# Patient Record
Sex: Male | Born: 2000 | Race: White | Hispanic: No | Marital: Single | Smoking: Never smoker
Health system: Southern US, Community
[De-identification: ages and names within clinical notes are randomized; demographics above are authoritative.]

## PROBLEM LIST (undated history)

## (undated) ENCOUNTER — Emergency Department (HOSPITAL_COMMUNITY): Admission: EM | Payer: BC Managed Care – PPO | Source: Home / Self Care

## (undated) ENCOUNTER — Emergency Department (HOSPITAL_COMMUNITY): Payer: BC Managed Care – PPO

## (undated) DIAGNOSIS — F419 Anxiety disorder, unspecified: Secondary | ICD-10-CM

## (undated) DIAGNOSIS — R569 Unspecified convulsions: Secondary | ICD-10-CM

## (undated) DIAGNOSIS — F329 Major depressive disorder, single episode, unspecified: Secondary | ICD-10-CM

## (undated) DIAGNOSIS — F32A Depression, unspecified: Secondary | ICD-10-CM

## (undated) HISTORY — PX: NO PAST SURGERIES: SHX2092

---
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
## 2022-03-06 ENCOUNTER — Encounter (HOSPITAL_COMMUNITY): Payer: Self-pay

## 2022-03-06 ENCOUNTER — Emergency Department (HOSPITAL_COMMUNITY)
Admission: EM | Admit: 2022-03-06 | Discharge: 2022-03-06 | Disposition: A | Discharge status: Home / Self Care | Payer: Worker's Compensation | Attending: Emergency Medicine | Admitting: Emergency Medicine

## 2022-03-06 ENCOUNTER — Other Ambulatory Visit: Payer: Self-pay

## 2022-03-06 DIAGNOSIS — T7840XA Allergy, unspecified, initial encounter: Secondary | ICD-10-CM | POA: Insufficient documentation

## 2022-03-06 DIAGNOSIS — T63441A Toxic effect of venom of bees, accidental (unintentional), initial encounter: Secondary | ICD-10-CM

## 2022-03-06 MED ORDER — EPINEPHRINE 0.3 MG/0.3ML IJ SOAJ: 0.3000 mg | INTRAMUSCULAR | 0 refills | Status: DC | PRN

## 2022-03-06 MED ORDER — FAMOTIDINE IN NACL 20-0.9 MG/50ML-% IV SOLN
20.0000 mg | Freq: Once | INTRAVENOUS | Status: AC
  Administered 2022-03-06: 20 mg via INTRAVENOUS
  Filled 2022-03-06: qty 50

## 2022-03-06 MED ORDER — METHYLPREDNISOLONE SODIUM SUCC 125 MG IJ SOLR
125.0000 mg | Freq: Once | INTRAMUSCULAR | Status: AC
  Administered 2022-03-06: 125 mg via INTRAVENOUS
  Filled 2022-03-06: qty 2

## 2022-03-06 MED ORDER — LORAZEPAM 2 MG/ML IJ SOLN
0.5000 mg | Freq: Once | INTRAMUSCULAR | Status: AC
  Administered 2022-03-06: 0.5 mg via INTRAVENOUS
  Filled 2022-03-06: qty 1

## 2022-03-06 NOTE — Discharge Instructions (Addendum)
It was our pleasure to provide your ER care today - we hope that you feel better.  Follow up with primary care doctor/allergist in coming week.  In event of severe reaction to bee sting (I.e. unable to breath, about to faint, throat closing) - use epipen as directed and seek medical attention right away.   Return to ER if worse, new symptoms, fevers, trouble breathing, general rash, weak/fainting, or other concern.  You were given meds that cause drowsiness - no driving for the next 8 hours.

## 2022-03-06 NOTE — ED Provider Notes (Addendum)
[facility]-EMERGENCY DEPT Provider Note   CSN: [id number] Arrival date & time: 03/06/22  1416     History  Chief Complaint  Patient presents with   Allergic Reaction    [facility] is a 21 y.o. male.  Pt presents via EMS s/p possible anaphylactic/allergic rxn to bee sting. Pt indicates stung by yellow jacket to left forearm shortly pta. Ems gave epi, benadryl and zofran. Pt felt generally weak, sob and diaphoretic post sting - post meds is feeling improved. Mild pain at sting site. No general rash. Denies throat closing/swelling. No current sob or faintness. No chest pain or discomfort. Felt fine, asymptomatic, at baseline prior to sting.   The history is provided by the patient, a parent, medical records and the EMS personnel.  Allergic Reaction Presenting symptoms: no difficulty swallowing and no rash        Home Medications Prior to Admission medications   Medication Sig Start Date End Date Taking? Authorizing Provider  FLUoxetine (PROZAC) 40 MG capsule Take 40 mg by mouth every morning. 04/21/21  Yes [provider]  gabapentin (NEURONTIN) 100 MG capsule Take 200 mg by mouth at bedtime. 01/14/22  Yes [provider]  guanFACINE (TENEX) 1 MG tablet Take 1 tablet by mouth 2 (two) times daily. 06/05/21  Yes [provider]      Allergies    Bee venom and Haldol [haloperidol]    Review of Systems   Review of Systems  Constitutional:  Negative for fever.  HENT:  Negative for trouble swallowing.   Eyes:  Negative for redness.  Respiratory:  Negative for cough and shortness of breath.   Cardiovascular:  Negative for chest pain.  Gastrointestinal:  Negative for abdominal pain, diarrhea and vomiting.  Genitourinary:  Negative for flank pain.  Musculoskeletal:  Negative for back pain and neck pain.  Skin:  Negative for rash.  Neurological:  Negative for headaches.  Hematological:  Does not bruise/bleed easily.   Psychiatric/Behavioral:  Positive for confusion. The patient is nervous/anxious.     Physical Exam Updated Vital Signs BP 123/60   Pulse (!) 56   Temp (!) 97.2 F (36.2 C)   Resp (!) 21   Ht 1.905 m (6\' 3" )   Wt 83.9 kg   SpO2 99%   BMI 23.12 kg/m  Physical Exam Vitals and nursing note reviewed.  Constitutional:      Appearance: Normal appearance. He is well-developed.  HENT:     Head: Atraumatic.     Nose: Nose normal.     Mouth/Throat:     Mouth: Mucous membranes are moist.     Pharynx: Oropharynx is clear.  Eyes:     General: No scleral icterus.    Conjunctiva/sclera: Conjunctivae normal.     Pupils: Pupils are equal, round, and reactive to light.  Neck:     Trachea: No tracheal deviation.  Cardiovascular:     Rate and Rhythm: Normal rate and regular rhythm.     Pulses: Normal pulses.     Heart sounds: Normal heart sounds. No murmur heard.    No friction rub. No gallop.  Pulmonary:     Effort: Pulmonary effort is normal. No accessory muscle usage or respiratory distress.     Breath sounds: Normal breath sounds.  Abdominal:     General: Bowel sounds are normal. There is no distension.     Palpations: Abdomen is soft.     Tenderness: There is no abdominal tenderness.  Genitourinary:  Comments: No cva tenderness. Musculoskeletal:        General: No swelling.     Cervical back: Normal range of motion and neck supple. No rigidity.  Skin:    General: Skin is warm and dry.     Findings: No rash.     Comments: Sting site left forearm. Small area of mild erythema ~ 2-3 mm, c/w sting, no fb. No significant swelling noted.   Neurological:     Mental Status: He is alert.     Comments: Alert, speech clear. Motor/sens grossly intact.  Psychiatric:     Comments: Anxious appearing.      ED Results / Procedures / Treatments   Labs (all labs ordered are listed, but only abnormal results are displayed) Results for orders placed or performed during the hospital  encounter of 11/27/20  Comprehensive metabolic panel  Result Value Ref Range   Sodium 140 135 - 145 mmol/L   Potassium 3.6 3.5 - 5.1 mmol/L   Chloride 110 98 - 111 mmol/L   CO2 16 (L) 22 - 32 mmol/L   Glucose, Bld 87 70 - 99 mg/dL   BUN 15 4 - 18 mg/dL   Creatinine, Ser 4.03 (H) 0.50 - 1.00 mg/dL   Calcium 47.4 8.9 - 25.9 mg/dL   Total Protein 7.5 6.5 - 8.1 g/dL   Albumin 4.8 3.5 - 5.0 g/dL   AST 31 15 - 41 U/L   ALT 21 0 - 44 U/L   Alkaline Phosphatase 103 52 - 171 U/L   Total Bilirubin 0.8 0.3 - 1.2 mg/dL   GFR, Estimated NOT CALCULATED >60 mL/min   Anion gap 14 5 - 15  Salicylate level  Result Value Ref Range   Salicylate Lvl <7.0 (L) 7.0 - 30.0 mg/dL  Acetaminophen level  Result Value Ref Range   Acetaminophen (Tylenol), Serum <10 (L) 10 - 30 ug/mL  Ethanol  Result Value Ref Range   Alcohol, Ethyl (B) <10 <10 mg/dL  Urine rapid drug screen (hosp performed)  Result Value Ref Range   Opiates NONE DETECTED NONE DETECTED   Cocaine NONE DETECTED NONE DETECTED   Benzodiazepines NONE DETECTED NONE DETECTED   Amphetamines NONE DETECTED NONE DETECTED   Tetrahydrocannabinol NONE DETECTED NONE DETECTED   Barbiturates NONE DETECTED NONE DETECTED  CBC WITH DIFFERENTIAL  Result Value Ref Range   WBC 8.7 4.5 - 13.5 K/uL   RBC 5.02 3.80 - 5.70 MIL/uL   Hemoglobin 14.3 12.0 - 16.0 g/dL   HCT 56.3 87.5 - 64.3 %   MCV 82.1 78.0 - 98.0 fL   MCH 28.5 25.0 - 34.0 pg   MCHC 34.7 31.0 - 37.0 g/dL   RDW 32.9 51.8 - 84.1 %   Platelets 257 150 - 400 K/uL   nRBC 0.0 0.0 - 0.2 %   Neutrophils Relative % 74 %   Neutro Abs 6.5 1.7 - 8.0 K/uL   Lymphocytes Relative 15 %   Lymphs Abs 1.3 1.1 - 4.8 K/uL   Monocytes Relative 10 %   Monocytes Absolute 0.9 0.2 - 1.2 K/uL   Eosinophils Relative 0 %   Eosinophils Absolute 0.0 0.0 - 1.2 K/uL   Basophils Relative 0 %   Basophils Absolute 0.0 0.0 - 0.1 K/uL   Immature Granulocytes 1 %   Abs Immature Granulocytes 0.04 0.00 - 0.07 K/uL   Urinalysis, Routine w reflex microscopic  Result Value Ref Range   Color, Urine YELLOW YELLOW   APPearance TURBID (A) CLEAR  Specific Gravity, Urine 1.017 1.005 - 1.030   pH 7.0 5.0 - 8.0   Glucose, UA NEGATIVE NEGATIVE mg/dL   Hgb urine dipstick NEGATIVE NEGATIVE   Bilirubin Urine NEGATIVE NEGATIVE   Ketones, ur NEGATIVE NEGATIVE mg/dL   Protein, ur NEGATIVE NEGATIVE mg/dL   Nitrite NEGATIVE NEGATIVE   Leukocytes,Ua NEGATIVE NEGATIVE   RBC / HPF 0-5 0 - 5 RBC/hpf   Bacteria, UA NONE SEEN NONE SEEN   Mucus PRESENT    Amorphous Crystal PRESENT   Comprehensive metabolic panel  Result Value Ref Range   Sodium 140 135 - 145 mmol/L   Potassium 3.6 3.5 - 5.1 mmol/L   Chloride 110 98 - 111 mmol/L   CO2 22 22 - 32 mmol/L   Glucose, Bld 89 70 - 99 mg/dL   BUN 13 4 - 18 mg/dL   Creatinine, Ser 4.09 0.50 - 1.00 mg/dL   Calcium 8.6 (L) 8.9 - 10.3 mg/dL   Total Protein 6.0 (L) 6.5 - 8.1 g/dL   Albumin 3.8 3.5 - 5.0 g/dL   AST 21 15 - 41 U/L   ALT 18 0 - 44 U/L   Alkaline Phosphatase 88 52 - 171 U/L   Total Bilirubin 0.9 0.3 - 1.2 mg/dL   GFR, Estimated NOT CALCULATED >60 mL/min   Anion gap 8 5 - 15  CBG monitoring, ED  Result Value Ref Range   Glucose-Capillary 95 70 - 99 mg/dL  I-Stat venous blood gas, [facility] ED)  Result Value Ref Range   pH, Ven 7.604 (HH) 7.250 - 7.430   pCO2, Ven 16.6 (LL) 44.0 - 60.0 mmHg   pO2, Ven 64.0 (H) 32.0 - 45.0 mmHg   Bicarbonate 16.5 (L) 20.0 - 28.0 mmol/L   TCO2 17 (L) 22 - 32 mmol/L   O2 Saturation 96.0 %   Acid-base deficit 2.0 0.0 - 2.0 mmol/L   Sodium 144 135 - 145 mmol/L   Potassium 3.1 (L) 3.5 - 5.1 mmol/L   Calcium, Ion 1.11 (L) 1.15 - 1.40 mmol/L   HCT 40.0 36.0 - 49.0 %   Hemoglobin 13.6 12.0 - 16.0 g/dL   Sample type VENOUS    Comment NOTIFIED PHYSICIAN    No results found.   EKG None  Radiology No results found.  Procedures Procedures    Medications Ordered in ED Medications  methylPREDNISolone sodium succinate  (SOLU-MEDROL) 125 mg/2 mL injection 125 mg (has no administration in time range)  famotidine (PEPCID) IVPB 20 mg premix (has no administration in time range)  LORazepam (ATIVAN) injection 0.5 mg (has no administration in time range)    ED Course/ Medical Decision Making/ A&P                           Medical Decision Making Problems Addressed: Bee sting reaction, accidental or unintentional, initial encounter: acute illness or injury with systemic symptoms that poses a threat to life or bodily functions  Amount and/or Complexity of Data Reviewed Independent Historian: parent    Details: hx External Data Reviewed: notes.  Risk Prescription drug management. Decision regarding hospitalization.   Iv ns. Continuous pulse ox and cardiac monitoring.   Diff dx includes bee sting, local rxn, anaphylaxis, etc-  dispo decision including potential need for admission considered if recurrent/severe symptoms, failure to improve w med therapy, etc - will give additional tx and reassess.   Reviewed nursing notes and prior charts for additional history. External reports reviewed. Additional history from:  EMS/fam.  Cardiac monitor: sinus rhythm, rate 66.  Solumedrol iv. Pepcid iv. Ativan iv.   Po fluids.  Pt feels improved. No recurrence of earlier symptoms.   Pt observed in ED for ~ 3.5 hrs, symptoms resolved.   Pt appears stable for d/c.   Rec pcp f/u.  Return precautions provided.            Final Clinical Impression(s) / ED Diagnoses Final diagnoses:  None    Rx / DC Orders ED Discharge Orders     None           Cathren Laine, MD 03/06/22 2315

## 2022-03-06 NOTE — ED Triage Notes (Signed)
Pt to er room number 14 via ems, per ems [place] was [place] [employer] and was stung by a bee and started feeling poorly, sob and diaphoretic, ems states that they gave epi, 50mg  benadryl, 4mg  zofran.  Pt denies feelings like his tongue or throat are swelling at this time, resps even and unlabored, pt eyes closed, but opens them to voice.

## 2022-07-23 ENCOUNTER — Ambulatory Visit (INDEPENDENT_AMBULATORY_CARE_PROVIDER_SITE_OTHER): Payer: Self-pay

## 2022-07-23 ENCOUNTER — Other Ambulatory Visit: Payer: Self-pay | Admitting: Physician Assistant

## 2022-07-23 DIAGNOSIS — Z021 Encounter for pre-employment examination: Secondary | ICD-10-CM

## 2022-08-25 IMAGING — DX DG CHEST 1V PORT
1 series · 1 of 1 positions shown · non-contrast
Comparison: Chest x-ray 11/27/2020.

CLINICAL DATA: Tachypnea.

EXAM:
PORTABLE CHEST 1 VIEW

[chest ap]
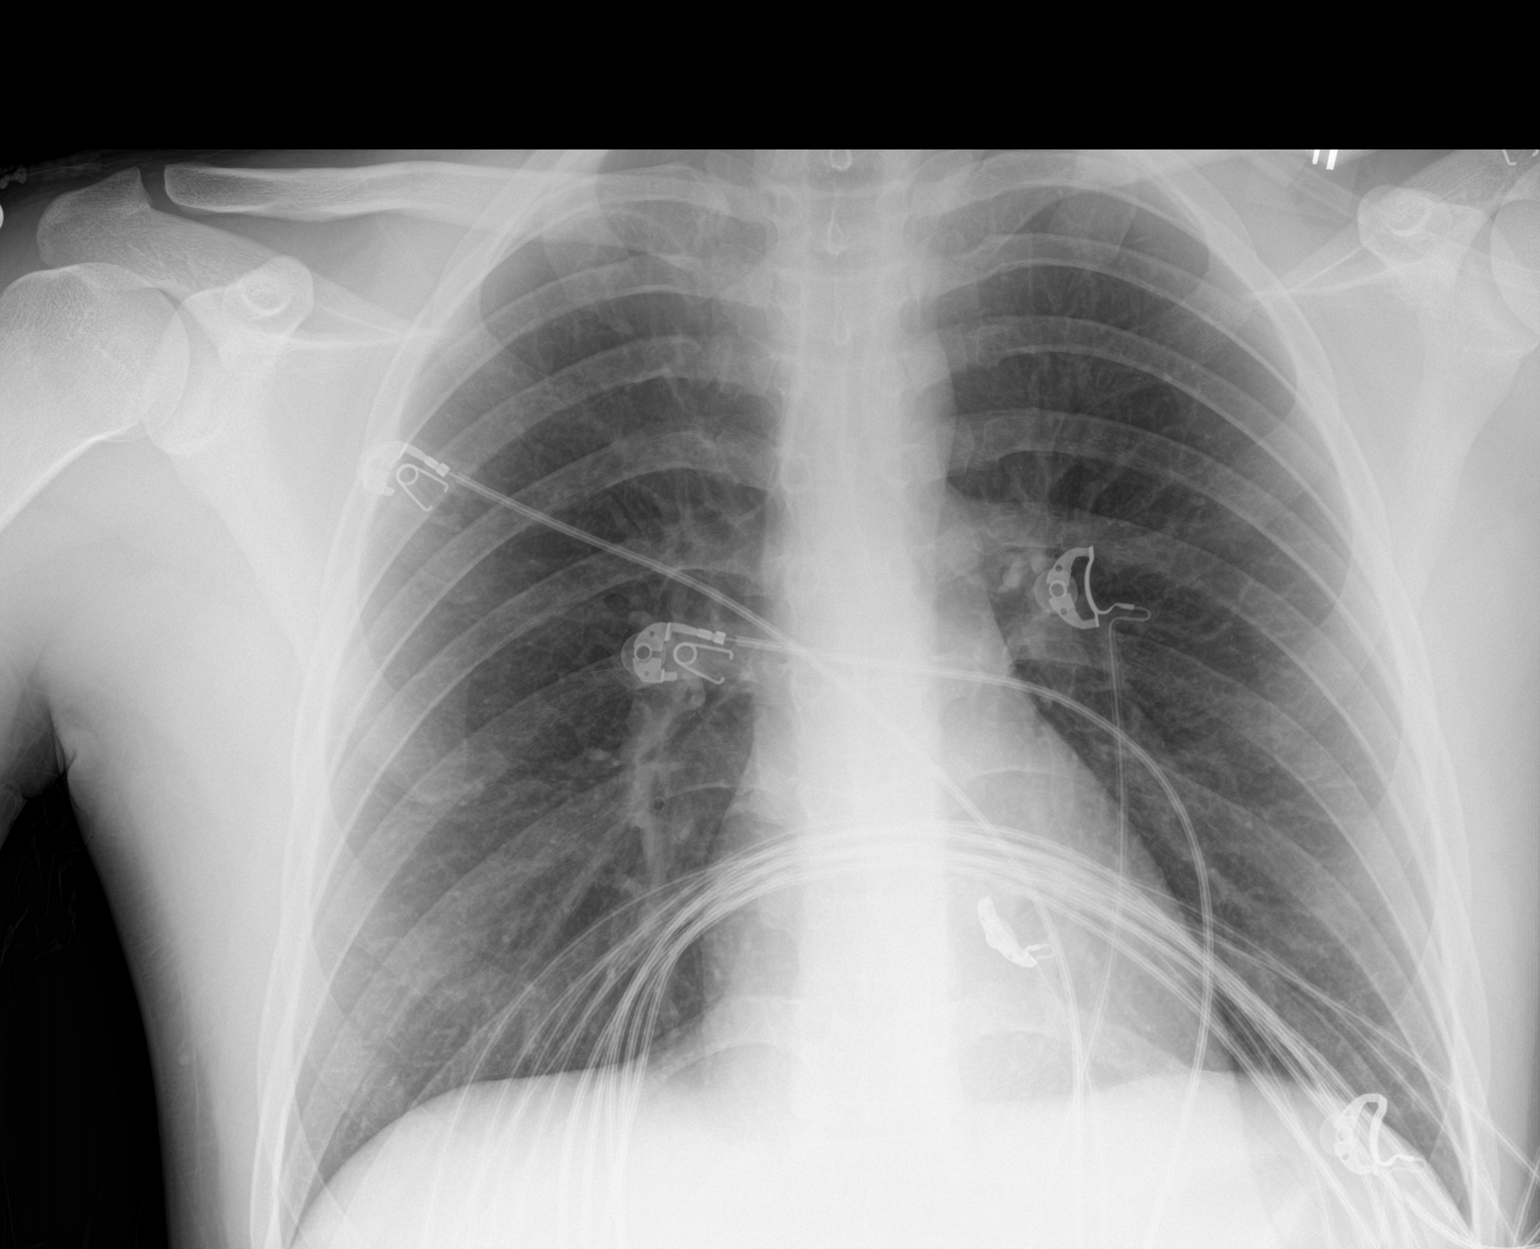

[1 of 1 positions shown; findings below may reference images not displayed]

FINDINGS: The heart size and mediastinal contours are within normal limits.
Both lungs are clear. The visualized skeletal structures are
unremarkable.
IMPRESSION: No active disease.

## 2023-02-28 ENCOUNTER — Other Ambulatory Visit: Payer: Self-pay

## 2023-02-28 ENCOUNTER — Encounter (HOSPITAL_COMMUNITY): Payer: Self-pay | Admitting: Nurse Practitioner

## 2023-02-28 ENCOUNTER — Inpatient Hospital Stay (HOSPITAL_COMMUNITY)
Admission: AD | Admit: 2023-02-28 | Discharge: 2023-03-04 | DRG: 885 | Disposition: A | Discharge status: Home / Self Care | Payer: BC Managed Care – PPO | Source: Intra-hospital | Attending: Psychiatry | Admitting: Psychiatry

## 2023-02-28 ENCOUNTER — Emergency Department (EMERGENCY_DEPARTMENT_HOSPITAL)
Admission: EM | Admit: 2023-02-28 | Discharge: 2023-02-28 | Disposition: A | Discharge status: Other Acute Inpatient Hospital | Payer: BC Managed Care – PPO | Source: Home / Self Care | Attending: Emergency Medicine | Admitting: Emergency Medicine

## 2023-02-28 ENCOUNTER — Encounter (HOSPITAL_COMMUNITY): Payer: Self-pay

## 2023-02-28 DIAGNOSIS — Z818 Family history of other mental and behavioral disorders: Secondary | ICD-10-CM

## 2023-02-28 DIAGNOSIS — R45851 Suicidal ideations: Secondary | ICD-10-CM

## 2023-02-28 DIAGNOSIS — S51812A Laceration without foreign body of left forearm, initial encounter: Secondary | ICD-10-CM | POA: Insufficient documentation

## 2023-02-28 DIAGNOSIS — T1491XA Suicide attempt, initial encounter: Secondary | ICD-10-CM | POA: Diagnosis present

## 2023-02-28 DIAGNOSIS — Z9151 Personal history of suicidal behavior: Secondary | ICD-10-CM | POA: Diagnosis not present

## 2023-02-28 DIAGNOSIS — Z560 Unemployment, unspecified: Secondary | ICD-10-CM

## 2023-02-28 DIAGNOSIS — Z79899 Other long term (current) drug therapy: Secondary | ICD-10-CM

## 2023-02-28 DIAGNOSIS — F332 Major depressive disorder, recurrent severe without psychotic features: Secondary | ICD-10-CM | POA: Diagnosis present

## 2023-02-28 DIAGNOSIS — X781XXA Intentional self-harm by knife, initial encounter: Secondary | ICD-10-CM | POA: Insufficient documentation

## 2023-02-28 DIAGNOSIS — X789XXA Intentional self-harm by unspecified sharp object, initial encounter: Secondary | ICD-10-CM | POA: Diagnosis not present

## 2023-02-28 DIAGNOSIS — S41112A Laceration without foreign body of left upper arm, initial encounter: Secondary | ICD-10-CM

## 2023-02-28 DIAGNOSIS — Z9152 Personal history of nonsuicidal self-harm: Secondary | ICD-10-CM | POA: Diagnosis not present

## 2023-02-28 DIAGNOSIS — S41122A Laceration with foreign body of left upper arm, initial encounter: Secondary | ICD-10-CM | POA: Diagnosis present

## 2023-02-28 DIAGNOSIS — F3341 Major depressive disorder, recurrent, in partial remission: Secondary | ICD-10-CM | POA: Diagnosis present

## 2023-02-28 LAB — RAPID URINE DRUG SCREEN, HOSP PERFORMED
Amphetamines: NOT DETECTED
Barbiturates: NOT DETECTED
Benzodiazepines: NOT DETECTED
Cocaine: NOT DETECTED
Opiates: NOT DETECTED
Tetrahydrocannabinol: NOT DETECTED

## 2023-02-28 LAB — CBC WITH DIFFERENTIAL/PLATELET
Abs Immature Granulocytes: 0.05 10*3/uL (ref 0.00–0.07)
Basophils Absolute: 0.1 10*3/uL (ref 0.0–0.1)
Basophils Relative: 1 %
Eosinophils Absolute: 0.3 10*3/uL (ref 0.0–0.5)
Eosinophils Relative: 3 %
HCT: 42.2 % (ref 39.0–52.0)
Hemoglobin: 14.2 g/dL (ref 13.0–17.0)
Immature Granulocytes: 0 %
Lymphocytes Relative: 13 %
Lymphs Abs: 1.5 10*3/uL (ref 0.7–4.0)
MCH: 26.6 pg (ref 26.0–34.0)
MCHC: 33.6 g/dL (ref 30.0–36.0)
MCV: 79 fL — ABNORMAL LOW (ref 80.0–100.0)
Monocytes Absolute: 0.9 10*3/uL (ref 0.1–1.0)
Monocytes Relative: 8 %
Neutro Abs: 8.8 10*3/uL — ABNORMAL HIGH (ref 1.7–7.7)
Neutrophils Relative %: 75 %
Platelets: 258 10*3/uL (ref 150–400)
RBC: 5.34 MIL/uL (ref 4.22–5.81)
RDW: 12.5 % (ref 11.5–15.5)
WBC: 11.5 10*3/uL — ABNORMAL HIGH (ref 4.0–10.5)
nRBC: 0 % (ref 0.0–0.2)

## 2023-02-28 LAB — COMPREHENSIVE METABOLIC PANEL
ALT: 21 U/L (ref 0–44)
AST: 23 U/L (ref 15–41)
Albumin: 4.6 g/dL (ref 3.5–5.0)
Alkaline Phosphatase: 82 U/L (ref 38–126)
Anion gap: 10 (ref 5–15)
BUN: 13 mg/dL (ref 6–20)
CO2: 21 mmol/L — ABNORMAL LOW (ref 22–32)
Calcium: 9.4 mg/dL (ref 8.9–10.3)
Chloride: 106 mmol/L (ref 98–111)
Creatinine, Ser: 1.05 mg/dL (ref 0.61–1.24)
GFR, Estimated: >60 mL/min (ref >60)
Glucose, Bld: 102 mg/dL — ABNORMAL HIGH (ref 70–99)
Potassium: 3.5 mmol/L (ref 3.5–5.1)
Sodium: 137 mmol/L (ref 135–145)
Total Bilirubin: 0.6 mg/dL (ref 0.3–1.2)
Total Protein: 7.8 g/dL (ref 6.5–8.1)

## 2023-02-28 LAB — ETHANOL: Alcohol, Ethyl (B): <10 mg/dL

## 2023-02-28 MED ORDER — MAGNESIUM HYDROXIDE 400 MG/5ML PO SUSP: 30.0000 mL | Freq: Every day | ORAL | Status: DC | PRN

## 2023-02-28 MED ORDER — HYDROXYZINE HCL 25 MG PO TABS
25.0000 mg | ORAL_TABLET | Freq: Three times a day (TID) | ORAL | Status: DC | PRN
  Filled 2023-02-28: qty 1

## 2023-02-28 MED ORDER — FLUOXETINE HCL 20 MG PO CAPS: 40.0000 mg | ORAL_CAPSULE | Freq: Every day | ORAL | Status: DC

## 2023-02-28 MED ORDER — ALUM & MAG HYDROXIDE-SIMETH 200-200-20 MG/5ML PO SUSP: 30.0000 mL | ORAL | Status: DC | PRN

## 2023-02-28 MED ORDER — LIDOCAINE-EPINEPHRINE (PF) 2 %-1:200000 IJ SOLN
10.0000 mL | Freq: Once | INTRAMUSCULAR | Status: AC
  Administered 2023-02-28: 10 mL
  Filled 2023-02-28: qty 20

## 2023-02-28 MED ORDER — ACETAMINOPHEN 325 MG PO TABS
650.0000 mg | ORAL_TABLET | Freq: Four times a day (QID) | ORAL | Status: DC | PRN
  Administered 2023-03-04: 650 mg via ORAL
  Filled 2023-02-28: qty 2

## 2023-02-28 MED ORDER — FLUOXETINE HCL 20 MG PO CAPS
40.0000 mg | ORAL_CAPSULE | Freq: Every day | ORAL | Status: DC
  Administered 2023-03-01 – 2023-03-04 (×4): 40 mg via ORAL
  Filled 2023-02-28 (×6): qty 2

## 2023-02-28 MED ORDER — HYDROXYZINE HCL 25 MG PO TABS: 25.0000 mg | ORAL_TABLET | Freq: Three times a day (TID) | ORAL | Status: DC | PRN

## 2023-02-28 NOTE — Consult Note (Signed)
BH ED ASSESSMENT   Reason for Consult:  Psychiatry evaluation Referring Physician:  ER Physician Patient Identification: Vincent Frank MRN:  [id number] ED Chief Complaint: Suicide attempt by cutting of wrist [facility])  Diagnosis:  Principal Problem:   Suicide attempt by cutting of wrist [facility]) Active Problems:   MDD (major depressive disorder), recurrent severe, without psychosis [facility])   ED Assessment Time Calculation: Start Time: 0926 Stop Time: 0958 Total Time in Minutes (Assessment Completion): 32   Subjective:   Vincent Frank is a 22 y.o. male patient admitted with Previous hx of MDD, Anxiety, ADHD, aggression, multiple suicide ideations and Oppositional defiant disorder brought to the ER by EMS after he cut his left fore arm and called 911.  According to patient this is his third time cutting himself.  His intention fir this cutting was to distract his attention because he lost two jobs yesterday and he says he takes responsibility for that.  HPI:  Patient was seen this morning he  states he did something stupid and lost his two jobs yesterday.,.  He did not want to go into details into what he did.  Patient became teary when speaking with Provider looking down occasionally.  Patient admits to diagnosis of Depression and that he takes Prozac.  He also states that he see Psychiatrist at [facility].  Although he denied suicide intent when he cut himself but he score high in his [place] suicide severity rating.  He also have hx of suicide ideations and running away per [facility] chart review.  Patient lives with his parents and according to the [facility] Chart review he was adopted at age 58 and relationship was great until an incident in the house that caused division in the family.  He has not been in good terms with his parents. Until yesterday patient had two jobs he really loved and lost them.  Patient is is a 22 years old who is well nourished, well groomed brought in to the ER by EMS  after he called to report he cut his fore arm.  He was hospitalized at [facility] in [place] 2021 when he cut his wrist.  He meets criteria for inpatient Psychiatry care.  We will seek bed placement at any facility that has available bed.  His Prozac and Hydroxyzine are resumed.   Currently he denies SI/HI/AVH   Past Psychiatric History: Previous hx of MDD, Anxiety, ADHD, aggression, multiple suicide ideations and Oppositional defiant disorder.  Hospitalized at [facility] back in 2021 after he cut his wrist.  Psychiatrist is Londell Moh at [facility]  Risk to Self or Others: Is the patient at risk to self? Yes Has the patient been a risk to self in the past 6 months? Yes Has the patient been a risk to self within the distant past? Yes Is the patient a risk to others? No Has the patient been a risk to others in the past 6 months? No Has the patient been a risk to others within the distant past? No  [place] Scale:  Flowsheet Row ED from 02/28/2023 in [facility] Emergency Department at [facility] ED from 03/06/2022 in [facility] Emergency Department at [facility] ED from 11/27/2021 in [facility] Emergency Department at [facility]  C-SSRS RISK CATEGORY High Risk No Risk No Risk       AIMS:  , , ,  ,   ASAM:    Substance Abuse:     Past Medical History:  Past Medical History:  Diagnosis Date   Major depressive disorder    History reviewed. No pertinent surgical history. Family History: History reviewed. No pertinent family history. Family Psychiatric  History: Denies, he does not know anything about biological family. Social History:  Social History   Substance and Sexual Activity  Alcohol Use Never     Social History   Substance and Sexual Activity  Drug Use Never    Social History   Socioeconomic History   Marital status: Single    Spouse name: Not on file   Number of children: Not on file   Years of education: Not on file   Highest  education level: Not on file  Occupational History   Not on file  Tobacco Use   Smoking status: Never   Smokeless tobacco: Never  Vaping Use   Vaping status: Never Used  Substance and Sexual Activity   Alcohol use: Never   Drug use: Never   Sexual activity: Not on file  Other Topics Concern   Not on file  Social History Narrative   Not on file   Social Determinants of Health   Financial Resource Strain: Not on file  Food Insecurity: Not on file  Transportation Needs: Not on file  Physical Activity: Not on file  Stress: Not on file  Social Connections: Unknown (01/16/2023)   Received from [facility]   Social Network    Social Network: Not on file   Additional Social History:    Allergies:   Allergies  Allergen Reactions   Bee Venom Anaphylaxis   Hornet Venom Anaphylaxis and Itching   Haldol [Haloperidol]     Per father dystonic reaction with  muscle contractures.    Labs:  Results for orders placed or performed during the hospital encounter of 02/28/23 (from the past 48 hour(s))  Comprehensive metabolic panel     Status: Abnormal   Collection Time: 02/28/23  6:19 AM  Result Value Ref Range   Sodium 137 135 - 145 mmol/L   Potassium 3.5 3.5 - 5.1 mmol/L   Chloride 106 98 - 111 mmol/L   CO2 21 (L) 22 - 32 mmol/L   Glucose, Bld 102 (H) 70 - 99 mg/dL    Comment: Glucose reference range applies only to samples taken after fasting for at least 8 hours.   BUN 13 6 - 20 mg/dL   Creatinine, Ser 4.13 0.61 - 1.24 mg/dL   Calcium 9.4 8.9 - 24.4 mg/dL   Total Protein 7.8 6.5 - 8.1 g/dL   Albumin 4.6 3.5 - 5.0 g/dL   AST 23 15 - 41 U/L   ALT 21 0 - 44 U/L   Alkaline Phosphatase 82 38 - 126 U/L   Total Bilirubin 0.6 0.3 - 1.2 mg/dL   GFR, Estimated >01 >02 mL/min    Comment: (NOTE) Calculated using the CKD-EPI Creatinine Equation (2021)    Anion gap 10 5 - 15    Comment: Performed at [facility], [address] [place], [place] [address]   Ethanol     Status: None   Collection Time: 02/28/23  6:19 AM  Result Value Ref Range   Alcohol, Ethyl (B) <10 <10 mg/dL    Comment: (NOTE) Lowest detectable limit for serum alcohol is 10 mg/dL.  For medical purposes only. Performed at [facility], [address], [place], [place] [address]   CBC with Diff     Status: Abnormal   Collection Time: 02/28/23  6:19  AM  Result Value Ref Range   WBC 11.5 (H) 4.0 - 10.5 K/uL   RBC 5.34 4.22 - 5.81 MIL/uL   Hemoglobin 14.2 13.0 - 17.0 g/dL   HCT 16.1 09.6 - 04.5 %   MCV 79.0 (L) 80.0 - 100.0 fL   MCH 26.6 26.0 - 34.0 pg   MCHC 33.6 30.0 - 36.0 g/dL   RDW 40.9 81.1 - 91.4 %   Platelets 258 150 - 400 K/uL   nRBC 0.0 0.0 - 0.2 %   Neutrophils Relative % 75 %   Neutro Abs 8.8 (H) 1.7 - 7.7 K/uL   Lymphocytes Relative 13 %   Lymphs Abs 1.5 0.7 - 4.0 K/uL   Monocytes Relative 8 %   Monocytes Absolute 0.9 0.1 - 1.0 K/uL   Eosinophils Relative 3 %   Eosinophils Absolute 0.3 0.0 - 0.5 K/uL   Basophils Relative 1 %   Basophils Absolute 0.1 0.0 - 0.1 K/uL   Immature Granulocytes 0 %   Abs Immature Granulocytes 0.05 0.00 - 0.07 K/uL    Comment: Performed at [facility], [address] [place], [place] [address]    Current Facility-Administered Medications  Medication Dose Route Frequency Provider Last Rate Last Admin   FLUoxetine (PROZAC) capsule 40 mg  40 mg Oral Daily Dahlia Byes C, NP       hydrOXYzine (ATARAX) tablet 25 mg  25 mg Oral TID PRN Earney Navy, NP       Current Outpatient Medications  Medication Sig Dispense Refill   EPINEPHrine 0.3 mg/0.3 mL IJ SOAJ injection Inject 0.3 mg into the muscle as needed for anaphylaxis. 2 each 0   FLUoxetine (PROZAC) 40 MG capsule Take 40 mg by mouth every morning.     gabapentin (NEURONTIN) 100 MG capsule Take 200 mg by mouth at bedtime. (Patient not taking: Reported on 02/28/2023)     guanFACINE (TENEX) 1 MG tablet Take 1 tablet by mouth  2 (two) times daily. (Patient not taking: Reported on 02/28/2023)      Musculoskeletal: Strength & Muscle Tone: within normal limits Gait & Station: normal Patient leans: Front   Psychiatric Specialty Exam: Presentation  General Appearance:  Casual; Well Groomed  Eye Contact: Good  Speech: Clear and Coherent; Normal Rate  Speech Volume: Normal  Handedness: Right   Mood and Affect  Mood: Depressed; Angry  Affect: Congruent; Depressed   Thought Process  Thought Processes: Coherent; Goal Directed  Descriptions of Associations:Intact  Orientation:Full (Time, Place and Person)  Thought Content:Logical  History of Schizophrenia/Schizoaffective disorder:No data recorded Duration of Psychotic Symptoms:No data recorded Hallucinations:Hallucinations: None  Ideas of Reference:None  Suicidal Thoughts:Suicidal Thoughts: No  Homicidal Thoughts:Homicidal Thoughts: No   Sensorium  Memory: Immediate Good; Recent Good; Remote Good  Judgment: Intact  Insight: Present   Executive Functions  Concentration: Good  Attention Span: Good  Recall: Good  Fund of Knowledge: Good  Language: Good   Psychomotor Activity  Psychomotor Activity: Psychomotor Activity: Normal   Assets  Assets: Communication Skills; Desire for Improvement; Housing; Physical Health    Sleep  Sleep: Sleep: Good   Physical Exam: Physical Exam Vitals and nursing note reviewed.  Constitutional:      Appearance: Normal appearance. He is normal weight.  HENT:     Head: Normocephalic.     Nose: Nose normal.  Cardiovascular:     Rate and Rhythm: Normal rate and regular rhythm.  Pulmonary:     Effort: Pulmonary effort is normal.  Musculoskeletal:        General: Normal range of motion.     Cervical back: Normal range of motion.  Skin:    General: Skin is dry.     Comments: Self made Deep cut to left fore arm required sutures.  Neurological:     Mental Status: He is  alert and oriented to person, place, and time.  Psychiatric:        Attention and Perception: Attention and perception normal.        Mood and Affect: Mood is anxious and depressed.        Speech: Speech normal.        Behavior: Behavior normal.        Thought Content: Thought content normal.        Cognition and Memory: Cognition and memory normal.        Judgment: Judgment is impulsive.    Review of Systems  Constitutional: Negative.   HENT: Negative.    Eyes: Negative.   Respiratory: Negative.    Cardiovascular: Negative.   Gastrointestinal: Negative.   Genitourinary: Negative.   Musculoskeletal: Negative.   Skin: Negative.        self made Deep cut to left fore arm required sutures.  Neurological: Negative.   Endo/Heme/Allergies: Negative.   Psychiatric/Behavioral:  Positive for depression. The patient is nervous/anxious.        Suicide attempt   Blood pressure 139/83, pulse 85, temperature 97.8 F (36.6 C), temperature source Oral, resp. rate 18, height 6\' 3"  (1.905 m), weight 102.1 kg, SpO2 97%. Body mass index is 28.12 kg/m.  Medical Decision Making: Patient denies SI/HI/AVH but this is third time he is cutting himself.  He lost two jobs in one day.  He feels hopeless at this time.He meets criteria for inpatient Psychiatry care.  We will seek bed placement at any facility that has available bed.  His Prozac and Hydroxyzine are resumed.   Currently he denies SI/HI/AVH   Problem 1: Suicide attempt  Problem 2: Recurrent Major Depressive disorder, severe without Psychotic features.  Disposition:  Admit, seek inpatient Hospitalization.  Earney Navy, NP-PMHNP-BC 02/28/2023 9:58 AM

## 2023-02-28 NOTE — Progress Notes (Signed)
Pt admitted to [facility] at this time from [facility]. Pt reports he was transported there via EMS after engaging in self injurious behavior. Pt cut left arm deep enough to require 5 stiches. Pt states he was having suicidal thoughts at the time however denied any plan or intent. Pt reports he cut self "To make my head stop" Pt states stressor include losing his 2 jobs yesterday and family conflicts. Pt lives at home with his parents and is unhappy about his situation. Pt denies hallucinations, does note a long history of paranoia. Pt cooperative throughout assessment, blunted affect noted. Q 15 minute checks initiated.

## 2023-02-28 NOTE — ED Provider Notes (Signed)
[place] EMERGENCY DEPARTMENT AT [facility] Provider Note   CSN: [id number] Arrival date & time: 02/28/23  0550     History  Chief Complaint  Patient presents with   Laceration   Suicidal    Pt bib ems for 2 inch laceration to pts L arm and suicidal ideation. Pt states he has had an increase in life stressors. Past history of self harm. Pt states he is current with anxiety and depression meds. [place], able to  de-escalate pt. Pt is A&O    Vincent Frank is a 22 y.o. male.  Patient presents to the emergency department with 2 lacerations on the posterior left forearm.  Patient endorses suicidal ideation and was self harming with a knife.  He endorses increased stressors in his life.  He denies auditory or visual hallucinations, denies homicidal ideation at this time.  He has a history of self-harm.  He states he is compliant with his Prozac and Neurontin.  HPI     Home Medications Prior to Admission medications   Medication Sig Start Date End Date Taking? Authorizing Provider  EPINEPHrine 0.3 mg/0.3 mL IJ SOAJ injection Inject 0.3 mg into the muscle as needed for anaphylaxis. 03/06/22   Cathren Laine, MD  FLUoxetine (PROZAC) 40 MG capsule Take 40 mg by mouth every morning. 04/21/21   [provider]  gabapentin (NEURONTIN) 100 MG capsule Take 200 mg by mouth at bedtime. 01/14/22   [provider]  guanFACINE (TENEX) 1 MG tablet Take 1 tablet by mouth 2 (two) times daily. 06/05/21   [provider]      Allergies    Bee venom and Haldol [haloperidol]    Review of Systems   Review of Systems  Physical Exam Updated Vital Signs BP 139/83 (BP Location: Right Arm)   Pulse 85   Temp 97.8 F (36.6 C) (Oral)   Resp 18   Ht 6\' 3"  (1.905 m)   Wt 102.1 kg   SpO2 97%   BMI 28.12 kg/m  Physical Exam Vitals and nursing note reviewed.  HENT:     Head: Normocephalic and atraumatic.  Eyes:     Conjunctiva/sclera:  Conjunctivae normal.  Pulmonary:     Effort: Pulmonary effort is normal. No respiratory distress.  Musculoskeletal:        General: No signs of injury.     Cervical back: Normal range of motion.  Skin:    General: Skin is dry.     Comments: 2 lacerations on posterior left forearm. Bleeding controlled.   Neurological:     Mental Status: He is alert.  Psychiatric:        Speech: Speech normal.        Behavior: Behavior normal.     ED Results / Procedures / Treatments   Labs (all labs ordered are listed, but only abnormal results are displayed) Labs Reviewed  COMPREHENSIVE METABOLIC PANEL  ETHANOL  RAPID URINE DRUG SCREEN, HOSP PERFORMED  CBC WITH DIFFERENTIAL/PLATELET    EKG None  Radiology No results found.  Procedures .Marland KitchenLaceration Repair  Date/Time: 02/28/2023 6:32 AM  Performed by: Darrick Grinder, PA-C Authorized by: Darrick Grinder, PA-C   Consent:    Consent obtained:  Verbal   Consent given by:  Patient   Risks, benefits, and alternatives were discussed: yes     Risks discussed:  Infection, pain and poor wound healing Universal protocol:    Patient identity confirmed:  Verbally with  patient Anesthesia:    Anesthesia method:  Local infiltration   Local anesthetic:  Lidocaine 2% WITH epi Laceration details:    Location:  Shoulder/arm   Shoulder/arm location:  L lower arm   Length (cm):  2 Exploration:    Hemostasis achieved with:  Direct pressure Treatment:    Area cleansed with:  Saline   Amount of cleaning:  Standard Skin repair:    Repair method:  Sutures   Suture size:  4-0   Suture material:  Prolene   Suture technique:  Simple interrupted   Number of sutures:  3 Approximation:    Approximation:  Close Post-procedure details:    Procedure completion:  Tolerated well, no immediate complications .Marland KitchenLaceration Repair  Date/Time: 02/28/2023 6:33 AM  Performed by: Darrick Grinder, PA-C Authorized by: Darrick Grinder, PA-C   Consent:     Consent obtained:  Verbal   Consent given by:  Patient   Risks discussed:  Infection and pain Anesthesia:    Anesthesia method:  Local infiltration   Local anesthetic:  Lidocaine 2% WITH epi Laceration details:    Location:  Shoulder/arm   Shoulder/arm location:  L lower arm   Length (cm):  1.5 Treatment:    Area cleansed with:  Saline   Amount of cleaning:  Standard Skin repair:    Repair method:  Sutures   Suture size:  4-0   Suture material:  Prolene   Suture technique:  Simple interrupted   Number of sutures:  2 Approximation:    Approximation:  Close Repair type:    Repair type:  Simple Post-procedure details:    Procedure completion:  Tolerated well, no immediate complications     Medications Ordered in ED Medications  lidocaine-EPINEPHrine (XYLOCAINE W/EPI) 2 %-1:200000 (PF) injection 10 mL (10 mLs Infiltration Given 02/28/23 8416)    ED Course/ Medical Decision Making/ A&P                                 Medical Decision Making Amount and/or Complexity of Data Reviewed Labs: ordered.  Risk Prescription drug management.   This patient presents to the ED for concern of lacerations and suicidal ideation.  Co morbidities that complicate the patient evaluation  History of self-harm, suicidal ideation   Additional history obtained:  Additional history obtained from EMS External records from outside source obtained and reviewed including behavioral health notes from March of this year   Lab Tests:  I Ordered medical clearance labs which are pending at this time   Test / Admission - Considered:  Patient care being transferred to [employer], PA-C. Patient lacerations repaired as noted above and will need removal in 7-10 days. Disposition pending labs and TTS evaluation/recommendations. Patient is currently VOLUNTARY.          Final Clinical Impression(s) / ED Diagnoses Final diagnoses:  Suicidal ideation  Laceration of left upper  extremity with complication, initial encounter    Rx / DC Orders ED Discharge Orders     None         Pamala Duffel 02/28/23 6063    Dione Booze, MD 02/28/23 [phone]

## 2023-02-28 NOTE — Discharge Instructions (Signed)
Sutures will need removal in 7-10 days.

## 2023-02-28 NOTE — ED Notes (Signed)
Provider at bedside for lac repair

## 2023-02-28 NOTE — ED Provider Notes (Signed)
Accepted handoff at shift change from [facility]. Please see prior provider note for full HPI.  Briefly: Patient is a 22 y.o. male who presents to the ER for suicidal ideation and self harm. Hx of similar. Denies HI, AVH.   DDX/Plan: Laceration repaired by previous team. Voluntary at this time. Medically cleared by prior provider pending medical screening labs.   Results for orders placed or performed during the hospital encounter of 02/28/23  Comprehensive metabolic panel  Result Value Ref Range   Sodium 137 135 - 145 mmol/L   Potassium 3.5 3.5 - 5.1 mmol/L   Chloride 106 98 - 111 mmol/L   CO2 21 (L) 22 - 32 mmol/L   Glucose, Bld 102 (H) 70 - 99 mg/dL   BUN 13 6 - 20 mg/dL   Creatinine, Ser 9.60 0.61 - 1.24 mg/dL   Calcium 9.4 8.9 - 45.4 mg/dL   Total Protein 7.8 6.5 - 8.1 g/dL   Albumin 4.6 3.5 - 5.0 g/dL   AST 23 15 - 41 U/L   ALT 21 0 - 44 U/L   Alkaline Phosphatase 82 38 - 126 U/L   Total Bilirubin 0.6 0.3 - 1.2 mg/dL   GFR, Estimated >09 >81 mL/min   Anion gap 10 5 - 15  Ethanol  Result Value Ref Range   Alcohol, Ethyl (B) <10 <10 mg/dL  CBC with Diff  Result Value Ref Range   WBC 11.5 (H) 4.0 - 10.5 K/uL   RBC 5.34 4.22 - 5.81 MIL/uL   Hemoglobin 14.2 13.0 - 17.0 g/dL   HCT 19.1 47.8 - 29.5 %   MCV 79.0 (L) 80.0 - 100.0 fL   MCH 26.6 26.0 - 34.0 pg   MCHC 33.6 30.0 - 36.0 g/dL   RDW 62.1 30.8 - 65.7 %   Platelets 258 150 - 400 K/uL   nRBC 0.0 0.0 - 0.2 %   Neutrophils Relative % 75 %   Neutro Abs 8.8 (H) 1.7 - 7.7 K/uL   Lymphocytes Relative 13 %   Lymphs Abs 1.5 0.7 - 4.0 K/uL   Monocytes Relative 8 %   Monocytes Absolute 0.9 0.1 - 1.0 K/uL   Eosinophils Relative 3 %   Eosinophils Absolute 0.3 0.0 - 0.5 K/uL   Basophils Relative 1 %   Basophils Absolute 0.1 0.0 - 0.1 K/uL   Immature Granulocytes 0 %   Abs Immature Granulocytes 0.05 0.00 - 0.07 K/uL   No results found.   Medical Decision Making Amount and/or Complexity of Data Reviewed Labs:  ordered.  Risk Prescription drug management.  Labs: Medical clearance labs ordered, with following pertinent results: CBC and CMP unremarkable. Negative ethanol. UDS pending.   Cardiac monitoring: EKG obtained and interpreted by attending physician which shows: NSR  Medications: Prozac and hydroxyzine ordered per Psych NP. I have reviewed the patients home medicines and have made adjustments as needed.  Disposition: Patient is otherwise medically cleared at this time pending medical clearance laboratory evaluation. Will consult TTS and appreciate their recommendations.  1230 -- Behavioral Health NP Dahlia Byes evaluated patient and recommends inpatient treatment. Will attempt to find bed placement.     Jeanella Flattery 02/28/23 1236    Lorre Nick, MD 03/03/23 1529

## 2023-02-28 NOTE — Progress Notes (Signed)
[facility] LCSW Progress Note   02/28/2023    3:02 PM  Vincent Frank   [id number]   Type of Contact and Topic:  Psychiatric Bed Placement   Pt accepted to [facility] 402-2   Patient meets inpatient criteria per Dahlia Byes, NP   The attending provider will be Dr. Sherron Flemings   Call report to [phone]  Ilsa Iha, RN @[facility]  ED notified.     Pt scheduled  to arrive at [facility] TODAY. The bed is currently ready.    Damita Dunnings, MSW, LCSW-A  3:03 PM 02/28/2023

## 2023-02-28 NOTE — ED Notes (Signed)
Sitter at bedside.

## 2023-02-28 NOTE — ED Notes (Signed)
Pt dressed out by tech, room cleared of hazardous items. Pt belongings bag placed in cabinet at nurses station. Clothing items and black phone in bag

## 2023-03-01 ENCOUNTER — Encounter (HOSPITAL_COMMUNITY): Payer: Self-pay

## 2023-03-01 DIAGNOSIS — F332 Major depressive disorder, recurrent severe without psychotic features: Secondary | ICD-10-CM | POA: Diagnosis not present

## 2023-03-01 MED ORDER — ARIPIPRAZOLE 2 MG PO TABS
2.0000 mg | ORAL_TABLET | Freq: Every day | ORAL | Status: DC
  Administered 2023-03-02: 2 mg via ORAL
  Filled 2023-03-01 (×3): qty 1

## 2023-03-01 MED ORDER — OLANZAPINE 5 MG PO TBDP: 5.0000 mg | ORAL_TABLET | Freq: Three times a day (TID) | ORAL | Status: DC | PRN

## 2023-03-01 MED ORDER — LORAZEPAM 1 MG PO TABS: 1.0000 mg | ORAL_TABLET | ORAL | Status: DC | PRN

## 2023-03-01 MED ORDER — TRAZODONE HCL 50 MG PO TABS: 50.0000 mg | ORAL_TABLET | Freq: Every evening | ORAL | Status: DC | PRN

## 2023-03-01 MED ORDER — ZIPRASIDONE MESYLATE 20 MG IM SOLR: 20.0000 mg | INTRAMUSCULAR | Status: DC | PRN

## 2023-03-01 NOTE — H&P (Signed)
Psychiatric Admission Assessment Adult  Patient Identification: Vincent Frank MRN:  [id number] Date of Evaluation:  03/01/2023 Chief Complaint:  Major depressive disorder, recurrent severe without psychotic features (HCC) [F33.2] Principal Diagnosis: Major depressive disorder, recurrent severe without psychotic features (HCC) Diagnosis:  Principal Problem:   Major depressive disorder, recurrent severe without psychotic features (HCC)  History of Present Illness:  Vincent Frank is a 22 yr old male who presented to [facility] on 8/4 after a Suicide Attempt (cutting arm with knife), he was admitted to [facility] on 8/5.  PPHx is significant for MDD, and 1 Prior Suicide Attempt (jump off a roof 2021), a history of Self Injurious Behavior (cutting), and 1 Prior Psychiatric Hospitalization [facility]- 2021) and [place]idential Stay [facility] [place]  He reports that he was brought to the hospital because of an impulsive decision.  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  He reports before he even realized what he was doing he was cutting his arm.  He reports that it was not planned and that it was impulsive.  He reports that he just lost control and did something stupid.  He reports past psychiatric history significant for depression.  He reports 1 prior suicide attempt 2021- jumping off a roof.  He reports a history of self-injurious behavior-cutting.  He reports [facility] and then going to [facility] in [place]. he reports past history significant for ankle surgery and root canal.  He reports one history at age 73 due to sports.  He reports no history of seizures.  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  He reports currently lives in a house with his adopted mother, father, and adopted sister.  Having just lost both of his jobs he is not currently employed.  He reports  a graduate high school.  He reports he graduated from the [employer].  He reports no alcohol use.  He reports no tobacco use.  He reports no substance abuse.  He reports no current legal issues.  He does report having guns in the house but they are locked up with the ammo kept separate.  He reports that his Prozac has been helpful at controlling his depression in the past but it has never helped with his mood control/impulsivity issues.  Discussed with him that there is some concern for bipolar disorder given the only family history he is aware of, his half brother, having bipolar disorder as well as his young age it is difficult to rule out bipolar disorder and him.  Discussed that given this we would recommend starting Abilify as this would both help provide mood stability but also augment his Prozac.  Discussed interest and side effects and he was agreeable to a trial.  He reports no SI, HI, or AVH.  He reports no other concerns present.   Associated Signs/Symptoms: Depression Symptoms:   Reports None (Hypo) Manic Symptoms:   Reports None Anxiety Symptoms:   Reports None Psychotic Symptoms:   Reports None PTSD Symptoms: NA Total Time spent with patient: 45 minutes  Past Psychiatric History: MDD, and 1 Prior Suicide Attempt (jump off a roof 2021), a history of Self Injurious Behavior (cutting), and 1 Prior Psychiatric Hospitalization [facility]- 2021) and Residential Stay [facility] [place]  Is the patient at risk to self? Yes.    Has the patient been a risk to self in  the past 6 months? No.  Has the patient been a risk to self within the distant past? Yes.    Is the patient a risk to others? No.  Has the patient been a risk to others in the past 6 months? No.  Has the patient been a risk to others within the distant past? No.   [place] Scale:  Flowsheet Row [facility] (Current[facility] Most recent reading at 02/28/2023  4:23 PM ED from  02/28/2023 in [facility] Emergency Department at [facility] Most recent reading at 02/28/2023  6:10 AM ED from 03/06/2022 in [facility] Emergency Department at [facility] Most recent reading at 03/06/2022  2:29 PM  C-SSRS RISK CATEGORY Low Risk High Risk No Risk        Prior Inpatient Therapy: Yes.   If yes, describe [facility] 2021  Prior Outpatient Therapy: Yes.   If yes, describe   Alcohol Screening: 1. How often do you have a drink containing alcohol?: Never 2. How many drinks containing alcohol do you have on a typical day when you are drinking?: 1 or 2 3. How often do you have six or more drinks on one occasion?: Never AUDIT-C Score: 0 4. How often during the last year have you found that you were not able to stop drinking once you had started?: Never 5. How often during the last year have you failed to do what was normally expected from you because of drinking?: Never 6. How often during the last year have you needed a first drink in the morning to get yourself going after a heavy drinking session?: Never 7. How often during the last year have you had a feeling of guilt of remorse after drinking?: Never 8. How often during the last year have you been unable to remember what happened the night before because you had been drinking?: Never 9. Have you or someone else been injured as a result of your drinking?: No 10. Has a relative or friend or a doctor or another health worker been concerned about your drinking or suggested you cut down?: No Alcohol Use Disorder Identification Test Final Score (AUDIT): 0 Alcohol Brief Interventions/Follow-up: Alcohol education/Brief advice Substance Abuse History in the last 12 months:  No. Consequences of Substance Abuse: NA Previous Psychotropic Medications: Yes  Prozac Psychological Evaluations: No  Past Medical History:  Past Medical History:  Diagnosis Date   Major depressive disorder    History reviewed. No pertinent  surgical history. Family History: History reviewed. No pertinent family history. Family Psychiatric  History: Adopted at young age so does not know Half-Brother: Bipolar Disorder, Substance Abuse Tobacco Screening:  Social History   Tobacco Use  Smoking Status Never  Smokeless Tobacco Never    BH Tobacco Counseling     Are you interested in Tobacco Cessation Medications?  N/A, patient does not use tobacco products Counseled patient on smoking cessation:  N/A, patient does not use tobacco products Reason Tobacco Screening Not Completed: No value filed.       Social History:  Social History   Substance and Sexual Activity  Alcohol Use Never     Social History   Substance and Sexual Activity  Drug Use Never    Additional Social History:                           Allergies:   Allergies  Allergen Reactions  Bee Venom Anaphylaxis   Hornet Venom Anaphylaxis and Itching   Haldol [Haloperidol]     Per father dystonic reaction with  muscle contractures.   Lab Results:  Results for orders placed or performed during the hospital encounter of 02/28/23 (from the past 48 hour(s))  Comprehensive metabolic panel     Status: Abnormal   Collection Time: 02/28/23  6:19 AM  Result Value Ref Range   Sodium 137 135 - 145 mmol/L   Potassium 3.5 3.5 - 5.1 mmol/L   Chloride 106 98 - 111 mmol/L   CO2 21 (L) 22 - 32 mmol/L   Glucose, Bld 102 (H) 70 - 99 mg/dL    Comment: Glucose reference range applies only to samples taken after fasting for at least 8 hours.   BUN 13 6 - 20 mg/dL   Creatinine, Ser 4.78 0.61 - 1.24 mg/dL   Calcium 9.4 8.9 - 29.5 mg/dL   Total Protein 7.8 6.5 - 8.1 g/dL   Albumin 4.6 3.5 - 5.0 g/dL   AST 23 15 - 41 U/L   ALT 21 0 - 44 U/L   Alkaline Phosphatase 82 38 - 126 U/L   Total Bilirubin 0.6 0.3 - 1.2 mg/dL   GFR, Estimated >62 >13 mL/min    Comment: (NOTE) Calculated using the CKD-EPI Creatinine Equation (2021)    Anion gap 10 5 - 15     Comment: Performed at [facility], [address] [place], [place] [address]  Ethanol     Status: None   Collection Time: 02/28/23  6:19 AM  Result Value Ref Range   Alcohol, Ethyl (B) <10 <10 mg/dL    Comment: (NOTE) Lowest detectable limit for serum alcohol is 10 mg/dL.  For medical purposes only. Performed at [facility], [address] [place], [place] [address]   CBC with Diff     Status: Abnormal   Collection Time: 02/28/23  6:19 AM  Result Value Ref Range   WBC 11.5 (H) 4.0 - 10.5 K/uL   RBC 5.34 4.22 - 5.81 MIL/uL   Hemoglobin 14.2 13.0 - 17.0 g/dL   HCT 29.5 28.4 - 13.2 %   MCV 79.0 (L) 80.0 - 100.0 fL   MCH 26.6 26.0 - 34.0 pg   MCHC 33.6 30.0 - 36.0 g/dL   RDW 44.0 10.2 - 72.5 %   Platelets 258 150 - 400 K/uL   nRBC 0.0 0.0 - 0.2 %   Neutrophils Relative % 75 %   Neutro Abs 8.8 (H) 1.7 - 7.7 K/uL   Lymphocytes Relative 13 %   Lymphs Abs 1.5 0.7 - 4.0 K/uL   Monocytes Relative 8 %   Monocytes Absolute 0.9 0.1 - 1.0 K/uL   Eosinophils Relative 3 %   Eosinophils Absolute 0.3 0.0 - 0.5 K/uL   Basophils Relative 1 %   Basophils Absolute 0.1 0.0 - 0.1 K/uL   Immature Granulocytes 0 %   Abs Immature Granulocytes 0.05 0.00 - 0.07 K/uL    Comment: Performed at [facility], [address], [place], [place] [address]  Rapid urine drug screen (hospital performed)     Status: None   Collection Time: 02/28/23  2:16 PM  Result Value Ref Range   Opiates NONE DETECTED NONE DETECTED   Cocaine NONE DETECTED NONE DETECTED   Benzodiazepines NONE DETECTED NONE DETECTED   Amphetamines NONE DETECTED NONE DETECTED   Tetrahydrocannabinol NONE DETECTED NONE DETECTED   Barbiturates NONE DETECTED NONE DETECTED  Comment: (NOTE) DRUG SCREEN FOR MEDICAL PURPOSES ONLY.  IF CONFIRMATION IS NEEDED FOR ANY PURPOSE, NOTIFY LAB WITHIN 5 DAYS.  LOWEST DETECTABLE LIMITS FOR URINE DRUG SCREEN Drug Class                      Cutoff (ng/mL) Amphetamine and metabolites    1000 Barbiturate and metabolites    200 Benzodiazepine                 200 Opiates and metabolites        300 Cocaine and metabolites        300 THC                            50 Performed at [facility], [address], [place], [place] [address]     Blood Alcohol level:  Lab Results  Component Value Date   [facility] <10 02/28/2023   ETH <10 11/27/2020    Metabolic Disorder Labs:  Lab Results  Component Value Date   HGBA1C 5.0 07/07/2021   MPG 96.8 07/07/2021   No results found for: "PROLACTIN" Lab Results  Component Value Date   CHOL 236 (H) 07/07/2021   TRIG 230 (H) 07/07/2021   HDL 52 07/07/2021   CHOLHDL 4.5 07/07/2021   VLDL 46 (H) 07/07/2021   LDLCALC 138 (H) 07/07/2021    Current Medications: Current Facility-Administered Medications  Medication Dose Route Frequency Provider Last Rate Last Admin   acetaminophen (TYLENOL) tablet 650 mg  650 mg Oral Q6H PRN Dahlia Byes C, NP       alum & mag hydroxide-simeth (MAALOX/MYLANTA) 200-200-20 MG/5ML suspension 30 mL  30 mL Oral Q4H PRN Welford Roche, Josephine C, NP       FLUoxetine (PROZAC) capsule 40 mg  40 mg Oral Daily Onuoha, Josephine C, NP       hydrOXYzine (ATARAX) tablet 25 mg  25 mg Oral TID PRN Dahlia Byes C, NP       magnesium hydroxide (MILK OF MAGNESIA) suspension 30 mL  30 mL Oral Daily PRN Dahlia Byes C, NP       PTA Medications: Medications Prior to Admission  Medication Sig Dispense Refill Last Dose   EPINEPHrine 0.3 mg/0.3 mL IJ SOAJ injection Inject 0.3 mg into the muscle as needed for anaphylaxis. 2 each 0    FLUoxetine (PROZAC) 40 MG capsule Take 40 mg by mouth every morning.      gabapentin (NEURONTIN) 100 MG capsule Take 200 mg by mouth at bedtime. (Patient not taking: Reported on 02/28/2023)      guanFACINE (TENEX) 1 MG tablet Take 1 tablet by mouth 2 (two) times daily. (Patient not taking: Reported on 02/28/2023)        Musculoskeletal: Strength & Muscle Tone: within normal limits Gait & Station: normal Patient leans: N/A            Psychiatric Specialty Exam:  Presentation  General Appearance:  Casual; Well Groomed  Eye Contact: Good  Speech: Clear and Coherent; Normal Rate  Speech Volume: Normal  Handedness: Right   Mood and Affect  Mood: Depressed; Angry  Affect: Congruent; Depressed   Thought Process  Thought Processes: Coherent; Goal Directed  Duration of Psychotic Symptoms:N/A Past Diagnosis of Schizophrenia or Psychoactive disorder: No data recorded Descriptions of Associations:Intact  Orientation:Full (Time, Place and Person)  Thought Content:Logical  Hallucinations:Hallucinations: None  Ideas of Reference:None  Suicidal Thoughts:Suicidal Thoughts: No  Homicidal Thoughts:Homicidal Thoughts: No   Sensorium  Memory: Immediate Good; Recent Good; Remote Good  Judgment: Intact  Insight: Present   Executive Functions  Concentration: Good  Attention Span: Good  Recall: Good  Fund of Knowledge: Good  Language: Good   Psychomotor Activity  Psychomotor Activity: Psychomotor Activity: Normal   Assets  Assets: Communication Skills; Desire for Improvement; Housing; Physical Health   Sleep  Sleep: Sleep: Good    Physical Exam: Physical Exam Vitals and nursing note reviewed.  Constitutional:      General: He is not in acute distress.    Appearance: Normal appearance. He is normal weight. He is not ill-appearing or toxic-appearing.  HENT:     Head: Normocephalic and atraumatic.  Pulmonary:     Effort: Pulmonary effort is normal.  Musculoskeletal:        General: Normal range of motion.  Neurological:     General: No focal deficit present.     Mental Status: He is alert.    Review of Systems  Respiratory:  Negative for cough and shortness of breath.   Cardiovascular:  Negative for chest pain.   Gastrointestinal:  Negative for abdominal pain, constipation, diarrhea, nausea and vomiting.  Neurological:  Negative for dizziness, weakness and headaches.  Psychiatric/Behavioral:  Positive for depression. Negative for hallucinations and suicidal ideas. The patient is nervous/anxious.    Blood pressure 119/83, pulse (!) 104, temperature (!) 97.4 F (36.3 C), temperature source Oral, resp. rate 18, height 6\' 3"  (1.905 m), weight 102.5 kg, SpO2 99%. Body mass index is 28.25 kg/m.  Treatment Plan Summary: Daily contact with patient to assess and evaluate symptoms and progress in treatment and Medication management  Raziel Camire is a 22 yr old male who presented to [facility] on 8/4 after a Suicide Attempt (cutting arm with knife), he was admitted to [facility] on 8/5.  PPHx is significant for MDD, and 1 Prior Suicide Attempt (jump off a roof 2021), a history of Self Injurious Behavior (cutting), and 1 Prior Psychiatric Hospitalization [facility]- 2021) and Residential Stay [facility] [place]   Donivin has a history of depression which he reports Prozac has been helpful in controlling.  At this time it is unclear if he has IED and given his age we cannot rule out Bipolar Disorder so we will start Abilify for better mood stability and to augment his Prozac.  Lipid Panel and A1c ordered.  We will continue to monitor.   MDD, recurrent, severe, w.out Psychosis (r/o IED, r/o Bipolar Disorder): -Continue Prozac 40 mg daily for depression -Start Abilify 2 mg daily tomorrow for mood stability and augmentation -Start Agitation Protocol: Zyprexa/Ativan/Geodon   -Continue PRN's: Tylenol, Maalox, Atarax, Milk of Magnesia, Trazodone    Observation Level/Precautions:  15 minute checks  Laboratory:  CMP: WNL except CO2: 21,  CBC: WNL except  WBC: 11.5, MCV: 79.0,  and Neutro Abs: 8.8,  UDS: Neg,  EKG: NSR with Qtc: 438.  Psychotherapy:    Medications:  Abilify, Prozac  Consultations:    Discharge  Concerns:    Estimated LOS: 5-7 days  Other:     Physician Treatment Plan for Primary Diagnosis: Major depressive disorder, recurrent severe without psychotic features (HCC) Long Term Goal(s): Improvement in symptoms so as ready for discharge  Short Term Goals: Ability to identify changes in lifestyle to reduce recurrence of condition will improve, Ability to verbalize feelings will improve, Ability to disclose and discuss suicidal ideas, Ability to demonstrate self-control will improve, and Ability  to identify and develop effective coping behaviors will improve  Physician Treatment Plan for Secondary Diagnosis: Principal Problem:   Major depressive disorder, recurrent severe without psychotic features (HCC)  Long Term Goal(s): Improvement in symptoms so as ready for discharge  Short Term Goals: Ability to identify changes in lifestyle to reduce recurrence of condition will improve, Ability to verbalize feelings will improve, Ability to disclose and discuss suicidal ideas, Ability to demonstrate self-control will improve, and Ability to identify and develop effective coping behaviors will improve  I certify that inpatient services furnished can reasonably be expected to improve the patient's condition.    Lauro Franklin, MD 8/5/20247:39 AM

## 2023-03-01 NOTE — [facility] Suicide Risk Assessment (Signed)
[facility] INPATIENT:  Family/Significant Other Suicide Prevention Education  Suicide Prevention Education:  Education Completed; Alireza Olaiz, mother [phone] (name of family member/significant other) has been identified by the patient as the family member/significant other with whom the patient will be residing, and identified as the person(s) who will aid the patient in the event of a mental health crisis (suicidal ideations/suicide attempt).  With written consent from the patient, the family member/significant other has been provided the following suicide prevention education, prior to the and/or following the discharge of the patient. Collateral information received from pt's mother, Clydie Braun who reported no safety concerns with pt returning home. Clydie Braun reported no firearms in the home and they have locked away all knives and sharp items. Clydie Braun reported that she continues to administer pt's medication. [name] mother reported that pt has been hospitalized at [facility] for 10 weeks in 2022, [place] and residential stay at [facility] in [place], [place]. for 1 year. Clydie Braun reported no inpatient stays for 2 years. Clydie Braun reported that pt and his 2 siblings were adopted at an early age. Clydie Braun reported that adoption was a closed adoption and she did not receive little to no information surrounding pt's parents and pt's younger life.   The suicide prevention education provided includes the following: Suicide risk factors Suicide prevention and interventions National Suicide Hotline telephone number [facility] assessment telephone number [facility] Emergency Assistance 911 [facility] and/or Residential Mobile Crisis Unit telephone number  Request made of family/significant other to: Remove weapons (e.g., guns, rifles, knives), all items previously/currently identified as safety concern.   Remove drugs/medications (over-the-counter, prescriptions, illicit drugs), all items previously/currently  identified as a safety concern.  The family member/significant other verbalizes understanding of the suicide prevention education information provided.  The family member/significant other agrees to remove the items of safety concern listed above.  Rogene Houston 03/01/2023, 5:49 PM

## 2023-03-01 NOTE — Group Note (Signed)
Recreation Therapy Group Note   Group Topic:Relaxation  Group Date: 03/01/2023 Start Time: 0945 End Time: 1005 Facilitators: Zayveon Raschke-McCall, LRT,CTRS Location: [address] Dayroom   Goal Area(s) Addresses:  Patient will identify positive stress management techniques. Patient will identify benefits of using stress management post d/c.  Group Description: Meditation. LRT discussed meditation with group. LRT then played a meditation that focused identifying those things that are holding you back and how to overcome them. Patients were to get as comfortable as possible and focus on the content of the meditation. LRT informed patients they could find other meditations and stress management techniques from apps, Youtube, scripts, etc.   Affect/Mood: N/A   Participation Level: Did not attend    Clinical Observations/Individualized Feedback:     Plan: Continue to engage patient in RT group sessions 2-3x/week.   Manie Bealer-McCall, LRT,CTRS 03/01/2023 12:33 PM

## 2023-03-01 NOTE — Progress Notes (Signed)
   02/28/23 2210  Psych Admission Type (Psych Patients Only)  Admission Status Voluntary  Psychosocial Assessment  Patient Complaints Self-harm behaviors;Self-harm thoughts;Anxiety  Eye Contact Brief  Facial Expression Anxious  Affect Sad  Speech Logical/coherent  Interaction Cautious;Forwards little  Motor Activity Other (Comment) (WDL)  Appearance/Hygiene Body odor  Behavior Characteristics Cooperative;Appropriate to situation  Mood Anxious  Thought Process  Coherency WDL  Content Blaming self  Delusions None reported or observed  Perception WDL  Hallucination None reported or observed  Judgment Poor  Confusion None  Danger to Self  Current suicidal ideation? Denies  Agreement Not to Harm Self Yes  Description of Agreement verbal  Danger to Others  Danger to Others None reported or observed

## 2023-03-01 NOTE — BH IP Treatment Plan (Signed)
Interdisciplinary Treatment and Diagnostic Plan Update  03/01/2023 Time of Session: 1030am Vincent Frank MRN: [id number]  Principal Diagnosis: Major depressive disorder, recurrent severe without psychotic features [facility])  Secondary Diagnoses: Principal Problem:   Major depressive disorder, recurrent severe without psychotic features (HCC)   Current Medications:  Current Facility-Administered Medications  Medication Dose Route Frequency Provider Last Rate Last Admin   acetaminophen (TYLENOL) tablet 650 mg  650 mg Oral Q6H PRN Dahlia Byes C, NP       alum & mag hydroxide-simeth (MAALOX/MYLANTA) 200-200-20 MG/5ML suspension 30 mL  30 mL Oral Q4H PRN Dahlia Byes C, NP       FLUoxetine (PROZAC) capsule 40 mg  40 mg Oral Daily Dahlia Byes C, NP   40 mg at 03/01/23 0843   hydrOXYzine (ATARAX) tablet 25 mg  25 mg Oral TID PRN Earney Navy, NP       magnesium hydroxide (MILK OF MAGNESIA) suspension 30 mL  30 mL Oral Daily PRN Earney Navy, NP       PTA Medications: Medications Prior to Admission  Medication Sig Dispense Refill Last Dose   EPINEPHrine 0.3 mg/0.3 mL IJ SOAJ injection Inject 0.3 mg into the muscle as needed for anaphylaxis. 2 each 0    FLUoxetine (PROZAC) 40 MG capsule Take 40 mg by mouth every morning.      gabapentin (NEURONTIN) 100 MG capsule Take 200 mg by mouth at bedtime. (Patient not taking: Reported on 02/28/2023)      guanFACINE (TENEX) 1 MG tablet Take 1 tablet by mouth 2 (two) times daily. (Patient not taking: Reported on 02/28/2023)       Patient Stressors:    Patient Strengths:    Treatment Modalities: Medication Management, Group therapy, Case management,  1 to 1 session with clinician, Psychoeducation, Recreational therapy.   Physician Treatment Plan for Primary Diagnosis: Major depressive disorder, recurrent severe without psychotic features (HCC) Long Term Goal(s):     Short Term Goals:    Medication Management:  Evaluate patient's response, side effects, and tolerance of medication regimen.  Therapeutic Interventions: 1 to 1 sessions, Unit Group sessions and Medication administration.  Evaluation of Outcomes: Progressing  Physician Treatment Plan for Secondary Diagnosis: Principal Problem:   Major depressive disorder, recurrent severe without psychotic features (HCC)  Long Term Goal(s):     Short Term Goals:       Medication Management: Evaluate patient's response, side effects, and tolerance of medication regimen.  Therapeutic Interventions: 1 to 1 sessions, Unit Group sessions and Medication administration.  Evaluation of Outcomes: Progressing   RN Treatment Plan for Primary Diagnosis: Major depressive disorder, recurrent severe without psychotic features (HCC) Long Term Goal(s): Knowledge of disease and therapeutic regimen to maintain health will improve  Short Term Goals: Ability to remain free from injury will improve, Ability to verbalize frustration and anger appropriately will improve, Ability to demonstrate self-control, Ability to participate in decision making will improve, Ability to verbalize feelings will improve, Ability to disclose and discuss suicidal ideas, Ability to identify and develop effective coping behaviors will improve, and Compliance with prescribed medications will improve  Medication Management: RN will administer medications as ordered by provider, will assess and evaluate patient's response and provide education to patient for prescribed medication. RN will report any adverse and/or side effects to prescribing provider.  Therapeutic Interventions: 1 on 1 counseling sessions, Psychoeducation, Medication administration, Evaluate responses to treatment, Monitor vital signs and CBGs as ordered, Perform/monitor CIWA, COWS, AIMS and Fall Risk screenings  as ordered, Perform wound care treatments as ordered.  Evaluation of Outcomes: Progressing   LCSW Treatment Plan  for Primary Diagnosis: Major depressive disorder, recurrent severe without psychotic features (HCC) Long Term Goal(s): Safe transition to appropriate next level of care at discharge, Engage patient in therapeutic group addressing interpersonal concerns.  Short Term Goals: Engage patient in aftercare planning with referrals and resources, Increase social support, Increase ability to appropriately verbalize feelings, Increase emotional regulation, Facilitate acceptance of mental health diagnosis and concerns, Facilitate patient progression through stages of change regarding substance use diagnoses and concerns, Identify triggers associated with mental health/substance abuse issues, and Increase skills for wellness and recovery  Therapeutic Interventions: Assess for all discharge needs, 1 to 1 time with Social worker, Explore available resources and support systems, Assess for adequacy in community support network, Educate family and significant other(s) on suicide prevention, Complete Psychosocial Assessment, Interpersonal group therapy.  Evaluation of Outcomes: Progressing   Progress in Treatment: Attending groups: No. Participating in groups: No. Taking medication as prescribed: Yes. Toleration medication: Yes. Family/Significant other contact made: No, will contact:  when consents are signed Patient understands diagnosis: Yes. Discussing patient identified problems/goals with staff: Yes. Medical problems stabilized or resolved: Yes. Denies suicidal/homicidal ideation: Yes. Issues/concerns per patient self-inventory: No. Other: no  New problem(s) identified: No, Describe:  none reported  New Short Term/Long Term Goal(s):medication management for mood stabilization; elimination of SI thoughts; development of comprehensive mental wellness/  Patient Goals:  work on mental health, medication management, and coping skills  Discharge Plan or Barriers: : Patient recently admitted. CSW will  continue to follow and assess for appropriate referrals and possible discharge planning.   Reason for Continuation of Hospitalization: Depression Medication stabilization Suicidal ideation  Estimated Length of Stay:3-7 days  Last 3 [place] Suicide Severity Risk Score: Flowsheet Row [facility] Most recent reading at 02/28/2023  4:23 PM ED from 02/28/2023 in [facility] Emergency Department at [facility] Most recent reading at 02/28/2023  6:10 AM ED from 03/06/2022 in [facility] Emergency Department at [facility] Most recent reading at 03/06/2022  2:29 PM  C-SSRS RISK CATEGORY Low Risk High Risk No Risk       Last PHQ 2/9 Scores:     No data to display          Scribe for Treatment Team: Charise Killian 03/01/2023 3:27 PM
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
## 2023-03-01 NOTE — Progress Notes (Signed)
   03/01/23 1730  Psych Admission Type (Psych Patients Only)  Admission Status Voluntary  Psychosocial Assessment  Patient Complaints None  Eye Contact Brief  Facial Expression Flat  Affect Flat  Speech Logical/coherent  Interaction Minimal  Motor Activity Other (Comment) (WDL)  Appearance/Hygiene Unremarkable  Behavior Characteristics Cooperative  Mood Pleasant  Thought Process  Coherency WDL  Content WDL  Delusions None reported or observed  Perception WDL  Hallucination None reported or observed  Judgment Poor  Confusion None  Danger to Self  Current suicidal ideation? Denies  Agreement Not to Harm Self Yes  Description of Agreement verbal  Danger to Others  Danger to Others None reported or observed

## 2023-03-01 NOTE — Plan of Care (Signed)
  Problem: Education: Goal: Knowledge of [place] [facility] Education information/materials will improve Outcome: Progressing   Problem: Activity: Goal: Interest or engagement in activities will improve Outcome: Progressing   

## 2023-03-01 NOTE — Progress Notes (Signed)
   03/01/23 0530  15 Minute Checks  Location Bedroom  Visual Appearance Calm  Behavior Sleeping  Sleep (Behavioral Health Patients Only)  Calculate sleep? (Click Yes once per 24 hr at 0600 safety check) Yes  Documented sleep last 24 hours 5

## 2023-03-02 DIAGNOSIS — F332 Major depressive disorder, recurrent severe without psychotic features: Secondary | ICD-10-CM | POA: Diagnosis not present

## 2023-03-02 MED ORDER — ARIPIPRAZOLE 5 MG PO TABS
5.0000 mg | ORAL_TABLET | Freq: Every day | ORAL | Status: DC
  Administered 2023-03-03 – 2023-03-04 (×2): 5 mg via ORAL
  Filled 2023-03-02 (×4): qty 1

## 2023-03-02 NOTE — BHH Group Notes (Signed)
Spiritual care group on grief and loss facilitated by chaplain Burnis Kingfisher MDiv, [facility]  Group Goal:  Support / Education around grief and loss Members engage in facilitated group support and psycho-social education.  Group Description:  Following introductions and group rules, group members engaged in facilitated group dialog and support around topic of loss, with particular support around experiences of loss in their lives. Group Identified types of loss (relationships / self / things) and identified patterns, circumstances, and changes that precipitate losses. Reflected on thoughts / feelings around loss, normalized grief responses, and recognized variety in grief experience.   Group noted Worden's four tasks of grief in discussion.  Group drew on Adlerian / Rogerian, narrative, MI, Patient Progress:  Vincent Frank was present throughout group.  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  He used the time to recognize layers of loss tied to loss of role - elements of identity, community, values.  He engaged with peer around coping, daily acceptance and reorientation.

## 2023-03-02 NOTE — Progress Notes (Signed)
   03/02/23 0557  15 Minute Checks  Location Bedroom  Visual Appearance Calm  Behavior Sleeping  Sleep (Behavioral Health Patients Only)  Calculate sleep? (Click Yes once per 24 hr at 0600 safety check) Yes  Documented sleep last 24 hours 8

## 2023-03-02 NOTE — Progress Notes (Signed)
[facility] MD Progress Note  03/02/2023 3:55 PM Vincent Frank  MRN:  [id number] Subjective:   Vincent Frank is a 22 yr old male who presented to [facility] on 8/4 after a Suicide Attempt (cutting arm with knife), he was admitted to [facility] on 8/5.  PPHx is significant for MDD, and 1 Prior Suicide Attempt (jump off a roof 2021), a history of Self Injurious Behavior (cutting), and 1 Prior Psychiatric Hospitalization [facility]- 2021) and Residential Stay [facility] [place]).    Case was discussed in the multidisciplinary team. MAR was reviewed and patient was compliant with medications.  He received no PRN medications yesterday,   Psychiatric Team made the following recommendations yesterday: -Continue Prozac 40 mg -Start Abilify 2 mg 8/6     On interview today patient reports he slept good last night.  He reports his appetite is doing good.  He reports no SI, HI, or AVH.  He reports no Paranoia or Ideas of Reference.  He reports no issues with his medications.  Discussed with him that we would plan to increase his Abilify to 5 mg tomorrow as 2 mg is a trial dose to ensure no side effects.  He reports agreement and has no concerns.  He reports no other concerns at this time.   Principal Problem: Major depressive disorder, recurrent severe without psychotic features (HCC) Diagnosis: Principal Problem:   Major depressive disorder, recurrent severe without psychotic features (HCC)  Total Time spent with patient:  I personally spent 35 minutes on the unit in direct patient care. The direct patient care time included face-to-face time with the patient, reviewing the patient's chart, communicating with other professionals, and coordinating care. Greater than 50% of this time was spent in counseling or coordinating care with the patient regarding goals of hospitalization, psycho-education, and discharge planning needs.   Past Psychiatric History: MDD, and 1 Prior Suicide Attempt (jump off a roof  2021), a history of Self Injurious Behavior (cutting), and 1 Prior Psychiatric Hospitalization [facility]- 2021) and Residential Stay [facility] [place]  Past Medical History:  Past Medical History:  Diagnosis Date   Major depressive disorder    History reviewed. No pertinent surgical history. Family History: History reviewed. No pertinent family history. Family Psychiatric  History: Adopted at young age so does not know Half-Brother: Bipolar Disorder, Substance Abuse Social History:  Social History   Substance and Sexual Activity  Alcohol Use Never     Social History   Substance and Sexual Activity  Drug Use Never    Social History   Socioeconomic History   Marital status: Single    Spouse name: Not on file   Number of children: Not on file   Years of education: Not on file   Highest education level: Not on file  Occupational History   Not on file  Tobacco Use   Smoking status: Never   Smokeless tobacco: Never  Vaping Use   Vaping status: Never Used  Substance and Sexual Activity   Alcohol use: Never   Drug use: Never   Sexual activity: Not on file  Other Topics Concern   Not on file  Social History Narrative   Not on file   Social Determinants of Health   Financial Resource Strain: Not on file  Food Insecurity: No Food Insecurity (02/28/2023)   Hunger Vital Sign    Worried About Running Out of Food in the Last Year: Never true    Ran Out of Food in the Last  Year: Never true  Transportation Needs: No Transportation Needs (02/28/2023)   PRAPARE - Administrator, Civil Service (Medical): No    Lack of Transportation (Non-Medical): No  Physical Activity: Not on file  Stress: Not on file  Social Connections: Unknown (01/16/2023)   Received from [facility]   Social Network    Social Network: Not on file   Additional Social History:                         Sleep: Good  Appetite:  Good  Current Medications: Current  Facility-Administered Medications  Medication Dose Route Frequency Provider Last Rate Last Admin   acetaminophen (TYLENOL) tablet 650 mg  650 mg Oral Q6H PRN Dahlia Byes C, NP       alum & mag hydroxide-simeth (MAALOX/MYLANTA) 200-200-20 MG/5ML suspension 30 mL  30 mL Oral Q4H PRN Earney Navy, NP       [START ON 03/03/2023] ARIPiprazole (ABILIFY) tablet 5 mg  5 mg Oral Daily Darene Nappi, Mardelle Matte, MD       FLUoxetine (PROZAC) capsule 40 mg  40 mg Oral Daily Dahlia Byes C, NP   40 mg at 03/02/23 1610   hydrOXYzine (ATARAX) tablet 25 mg  25 mg Oral TID PRN Earney Navy, NP       OLANZapine zydis (ZYPREXA) disintegrating tablet 5 mg  5 mg Oral Q8H PRN Cullin Dishman, Mardelle Matte, MD       And   LORazepam (ATIVAN) tablet 1 mg  1 mg Oral PRN Lauro Franklin, MD       And   ziprasidone (GEODON) injection 20 mg  20 mg Intramuscular PRN Keitra Carusone, Mardelle Matte, MD       magnesium hydroxide (MILK OF MAGNESIA) suspension 30 mL  30 mL Oral Daily PRN Dahlia Byes C, NP       traZODone (DESYREL) tablet 50 mg  50 mg Oral QHS PRN Lauro Franklin, MD        Lab Results:  No results found for this or any previous visit (from the past 48 hour(s)).   Blood Alcohol level:  Lab Results  Component Value Date   ETH <10 02/28/2023   ETH <10 11/27/2020    Metabolic Disorder Labs: Lab Results  Component Value Date   HGBA1C 5.0 07/07/2021   MPG 96.8 07/07/2021   No results found for: "PROLACTIN" Lab Results  Component Value Date   CHOL 236 (H) 07/07/2021   TRIG 230 (H) 07/07/2021   HDL 52 07/07/2021   CHOLHDL 4.5 07/07/2021   VLDL 46 (H) 07/07/2021   LDLCALC 138 (H) 07/07/2021    Physical Findings: AIMS:  , ,  ,  ,    CIWA:    COWS:     Musculoskeletal: Strength & Muscle Tone: within normal limits Gait & Station: normal Patient leans: N/A  Psychiatric Specialty Exam:  Presentation  General Appearance:  Appropriate for Environment; Casual  Eye  Contact: Good  Speech: Clear and Coherent; Normal Rate  Speech Volume: Normal  Handedness: Right   Mood and Affect  Mood: Dysphoric  Affect: Congruent   Thought Process  Thought Processes: Coherent; Goal Directed  Descriptions of Associations:Intact  Orientation:Full (Time, Place and Person)  Thought Content:Logical; WDL  History of Schizophrenia/Schizoaffective disorder:No data recorded Duration of Psychotic Symptoms:No data recorded Hallucinations:Hallucinations: None  Ideas of Reference:None  Suicidal Thoughts:Suicidal Thoughts: No  Homicidal Thoughts:Homicidal Thoughts: No   Sensorium  Memory: Immediate Good;  Recent Good  Judgment: Intact  Insight: Fair   Chartered certified accountant: Good  Attention Span: Good  Recall: Good  Fund of Knowledge: Good  Language: Good   Psychomotor Activity  Psychomotor Activity: Psychomotor Activity: Normal   Assets  Assets: Communication Skills; Desire for Improvement; Physical Health; Resilience; Social Support; Housing   Sleep  Sleep: Sleep: Good    Physical Exam: Physical Exam Vitals and nursing note reviewed.  Constitutional:      General: He is not in acute distress.    Appearance: Normal appearance. He is normal weight. He is not ill-appearing or toxic-appearing.  HENT:     Head: Normocephalic and atraumatic.  Pulmonary:     Effort: Pulmonary effort is normal.  Musculoskeletal:        General: Normal range of motion.  Neurological:     General: No focal deficit present.     Mental Status: He is alert.    Review of Systems  Respiratory:  Negative for cough and shortness of breath.   Cardiovascular:  Negative for chest pain.  Gastrointestinal:  Negative for abdominal pain, constipation, diarrhea, nausea and vomiting.  Neurological:  Negative for dizziness, weakness and headaches.  Psychiatric/Behavioral:  Negative for depression, hallucinations and suicidal ideas.  The patient is not nervous/anxious.    Blood pressure 118/72, pulse (!) 103, temperature 97.8 F (36.6 C), temperature source Oral, resp. rate 18, height 6\' 3"  (1.905 m), weight 102.5 kg, SpO2 100%. Body mass index is 28.25 kg/m.   Treatment Plan Summary: Daily contact with patient to assess and evaluate symptoms and progress in treatment and Medication management  [place] Oskey is a 22 yr old male who presented to [facility] on 8/4 after a Suicide Attempt (cutting arm with knife), he was admitted to [facility] on 8/5.  PPHx is significant for MDD, and 1 Prior Suicide Attempt (jump off a roof 2021), a history of Self Injurious Behavior (cutting), and 1 Prior Psychiatric Hospitalization [facility]- 2021) and Residential Stay [facility] [place]     Dazhon started the Abilify today.  We will increase his Abilify tomorrow to 5 mg.  We will not make any other changes to his medications at this time.  Lipid Panel, A1c, and TSH ordered.  We will continue to monitor.     MDD, recurrent, severe, w.out Psychosis (r/o IED, r/o Bipolar Disorder): -Continue Prozac 40 mg daily for depression -Start Abilify 2 mg daily today for mood stability and augmentation, will increase to 5 mg tomorrow -Continue Agitation Protocol: Zyprexa/Ativan/Geodon     -Continue PRN's: Tylenol, Maalox, Atarax, Milk of Magnesia, Trazodone   Lauro Franklin, MD 03/02/2023, 3:55 PM

## 2023-03-02 NOTE — Plan of Care (Signed)
  Problem: Health Behavior/Discharge Planning: Goal: Compliance with treatment plan for underlying cause of condition will improve Outcome: Progressing   Problem: Safety: Goal: Periods of time without injury will increase Outcome: Progressing   

## 2023-03-02 NOTE — BHH Group Notes (Signed)
Adult Psychoeducational Group Note  Date:  03/02/2023 Time:  10:16 PM  Group Topic/Focus:  Wrap-Up Group:   The focus of this group is to help patients review their daily goal of treatment and discuss progress on daily workbooks.  Participation Level:  Did Not Attend  Additional Comments:  Pt did not attend group.  Maura Crandall Cassandra 03/02/2023, 10:16 PM

## 2023-03-02 NOTE — Group Note (Signed)
Date:  03/02/2023 Time:  10:15 AM  Group Topic/Focus:  Goals Group:   The focus of this group is to help patients establish daily goals to achieve during treatment and discuss how the patient can incorporate goal setting into their daily lives to aide in recovery.    Participation Level:  Active  Participation Quality:  Appropriate  Affect:  Appropriate  Cognitive:  Appropriate  Insight: Appropriate  Engagement in Group:  Engaged  Modes of Intervention:  Discussion  Additional Comments:     Reymundo Poll 03/02/2023, 10:15 AM

## 2023-03-02 NOTE — Plan of Care (Signed)
Problem: Health Behavior/Discharge Planning: Goal: Ability to remain free from injury will improve Outcome: Progressing   Problem: Coping: Goal: Ability to interact with others will improve Outcome: Progressing   Pt A & O X4. Denies SI, HI, AVH and pain. Presents animated with fair eye contact, logical speech, fidgety but pleasant and ambulatory with steady gait. Visible in milieu at long intervals during shift. Participated in scheduled milieu groups. Interacts well with others. Reports he slept well with fair appetite "I usually eat 1 meal, supper any ways when I'm at home". Encouraged to eat his meals to meet calorie intake and not to take his medications on an empty stomach. Compliant with medications when offered. Rated his anxiety and depression all 0/10. Safety checks maintained at Q 15 minutes intervals without self harm gestures. Denies concerns at this time.

## 2023-03-02 NOTE — Progress Notes (Signed)
   03/01/23 2150  Psych Admission Type (Psych Patients Only)  Admission Status Voluntary  Psychosocial Assessment  Patient Complaints None  Eye Contact Brief  Facial Expression Flat  Affect Sad  Speech Logical/coherent  Interaction Minimal  Motor Activity Other (Comment) (WDL)  Appearance/Hygiene Unremarkable  Behavior Characteristics Cooperative;Appropriate to situation  Mood Depressed  Thought Process  Coherency WDL  Content WDL  Delusions None reported or observed  Perception WDL  Hallucination None reported or observed  Judgment Impaired  Confusion None  Danger to Self  Current suicidal ideation? Denies  Agreement Not to Harm Self Yes  Description of Agreement verbal  Danger to Others  Danger to Others None reported or observed   Pt was offered support and encouragement. Q 15 minute checks were done for safety. Pt has no complaints.Pt receptive to treatment and safety maintained on unit.

## 2023-03-02 NOTE — Group Note (Signed)
Recreation Therapy Group Note   Group Topic:Animal Assisted Therapy   Group Date: 03/02/2023 Start Time: 0950 End Time: 1030 Facilitators: Tori Cupps-McCall, LRT,CTRS Location: 300 [address] Dayroom   Animal-Assisted Activity (AAA) Program Checklist/Progress Notes Patient Eligibility Criteria Checklist & Daily Group note for Rec Tx Intervention  AAA/T Program Assumption of Risk Form signed by Patient/ or Parent Legal Guardian Yes  Patient is free of allergies or severe asthma Yes  Patient reports no fear of animals Yes  Patient reports no history of cruelty to animals Yes  Patient understands his/her participation is voluntary Yes  Patient washes hands before animal contact Yes  Patient washes hands after animal contact Yes   Affect/Mood: Appropriate   Participation Level: Engaged   Participation Quality: Independent   Behavior: Appropriate   Speech/Thought Process: Focused   Insight: Good   Judgement: Good   Modes of Intervention: Teaching laboratory technician   Patient Response to Interventions:  Engaged   Education Outcome:  Acknowledges education   Clinical Observations/Individualized Feedback: Patient attended session and interacted appropriately with therapy dog and peers. Patient asked appropriate questions about therapy dog and his training. Patient shared stories about their pets at home with group.    Plan: Continue to engage patient in RT group sessions 2-3x/week.   Charizma Gardiner-McCall, LRT,CTRS 03/02/2023 1:05 PM

## 2023-03-03 DIAGNOSIS — F332 Major depressive disorder, recurrent severe without psychotic features: Secondary | ICD-10-CM | POA: Diagnosis not present

## 2023-03-03 MED ORDER — SENNOSIDES-DOCUSATE SODIUM 8.6-50 MG PO TABS
1.0000 | ORAL_TABLET | Freq: Once | ORAL | Status: AC
  Administered 2023-03-03: 1 via ORAL
  Filled 2023-03-03: qty 1

## 2023-03-03 MED ORDER — SENNOSIDES-DOCUSATE SODIUM 8.6-50 MG PO TABS
1.0000 | ORAL_TABLET | Freq: Once | ORAL | Status: AC
  Administered 2023-03-03: 1 via ORAL
  Filled 2023-03-03 (×2): qty 1

## 2023-03-03 NOTE — Progress Notes (Signed)
   03/02/23 2050  Psych Admission Type (Psych Patients Only)  Admission Status Voluntary  Psychosocial Assessment  Patient Complaints None  Eye Contact Fair  Facial Expression Animated  Affect Appropriate to circumstance  Speech Logical/coherent  Interaction Minimal  Motor Activity Other (Comment) (WDL)  Appearance/Hygiene Unremarkable  Behavior Characteristics Cooperative  Mood Pleasant  Thought Process  Coherency WDL  Content WDL  Delusions None reported or observed  Perception WDL  Hallucination None reported or observed  Judgment Impaired  Confusion None  Danger to Self  Current suicidal ideation? Denies  Agreement Not to Harm Self Yes  Description of Agreement verbal  Danger to Others  Danger to Others None reported or observed   Pt was offered support and encouragement. Q 15 minute checks were done for safety. Pt has no complaints.Pt receptive to treatment and safety maintained on unit.

## 2023-03-03 NOTE — Progress Notes (Signed)
  Patient rated his depression 0/10 and his anxiety 0/10 with 10 being the highest and 0 being none. Patient denied SI/HI and AVH. Medication compliant. Safety maintained.   03/03/23 0820  Psych Admission Type (Psych Patients Only)  Admission Status Voluntary  Psychosocial Assessment  Patient Complaints None  Eye Contact Fair  Facial Expression Animated  Affect Appropriate to circumstance  Speech Logical/coherent  Interaction Assertive  Motor Activity Other (Comment) (WNL)  Appearance/Hygiene Unremarkable  Behavior Characteristics Cooperative  Mood Pleasant  Thought Process  Coherency WDL  Content WDL  Delusions None reported or observed  Perception WDL  Hallucination None reported or observed  Judgment Impaired  Confusion None  Danger to Self  Current suicidal ideation? Denies  Agreement Not to Harm Self Yes  Description of Agreement Verbal  Danger to Others  Danger to Others None reported or observed

## 2023-03-03 NOTE — Progress Notes (Signed)
   03/03/23 0600  15 Minute Checks  Location Bedroom  Visual Appearance Calm  Behavior Sleeping  Sleep (Behavioral Health Patients Only)  Calculate sleep? (Click Yes once per 24 hr at 0600 safety check) Yes  Documented sleep last 24 hours 8.75

## 2023-03-03 NOTE — Progress Notes (Signed)
Pt completed forms to receive services from [facility], forms faxed to [phone]. Pt made aware and in agreement with plan.

## 2023-03-03 NOTE — Group Note (Signed)
Recreation Therapy Group Note   Group Topic:Team Building  Group Date: 03/03/2023 Start Time: 0930 End Time: 1000 Facilitators: Portia Wisdom-McCall, LRT,CTRS Location: [address] Dayroom   Goal Area(s) Addresses:  Patient will effectively work with peer towards shared goal.  Patient will identify skills used to make activity successful.  Patient will identify how skills used during activity can be applied to reach post d/c goals.   Group Description: [employer]. In teams of 5-6, patients were given 11 craft pipe cleaners. Using the materials provided, patients were instructed to compete again the opposing team(s) to build the tallest free-standing structure from floor level. The activity was timed; difficulty increased by Clinical research associate as Production designer, theatre/television/film continued.  Systematically resources were removed with additional directions for example, placing one arm behind their back, working in silence, and shape stipulations. LRT facilitated post-activity discussion reviewing team processes and necessary communication skills involved in completion. Patients were encouraged to reflect how the skills utilized, or not utilized, in this activity can be incorporated to positively impact support systems post discharge.    Clinical Observations/Individualized Feedback: Due to limited staffing, group did not occur because LRT was helping on [address] hall by opening the dayroom for the patients.    Plan: Continue to engage patient in RT group sessions 2-3x/week.   Semya Klinke-McCall, LRT,CTRS 03/03/2023 1:24 PM

## 2023-03-03 NOTE — Plan of Care (Signed)
  Problem: Education: Goal: Knowledge of  General Education information/materials will improve Outcome: Progressing   Problem: Education: Goal: Verbalization of understanding the information provided will improve Outcome: Progressing   Problem: Activity: Goal: Interest or engagement in activities will improve Outcome: Progressing   Problem: Activity: Goal: Sleeping patterns will improve Outcome: Progressing   Problem: Education: Goal: Ability to incorporate positive changes in behavior to improve self-esteem will improve Outcome: Progressing

## 2023-03-03 NOTE — Progress Notes (Signed)
[facility] MD Progress Note  03/03/2023 9:03 AM Vincent Frank  MRN:  [id number] Subjective:   Vincent Frank is a 22 yr old male who presented to [facility] on 8/4 after a Suicide Attempt (cutting arm with knife), he was admitted to [facility] on 8/5.  PPHx is significant for MDD, and 1 Prior Suicide Attempt (jump off a roof 2021), a history of Self Injurious Behavior (cutting), and 1 Prior Psychiatric Hospitalization [facility]- 2021) and Residential Stay [facility] [place]).    Case was discussed in the multidisciplinary team. MAR was reviewed and patient was compliant with medications.  He received no PRN medications yesterday,   Psychiatric Team made the following recommendations yesterday: -Continue Prozac 40 mg -Increase Abilify to 5 mg 8/7   On interview today patient reports he slept good last night.  He reports his appetite is doing good.  He reports no SI, HI, or AVH.  He reports no Paranoia or Ideas of Reference.  He reports no issues with his medications.  He reports that he is feeling good and rates his depression and anxiety both as 0 out of 10.  He reports that he has not had a BM in 3 days.  Discussed we would start Senokot and he was agreeable with this.  He reports that his mother will be coming to visit him tonight.  They reports no other concerns are present.   Principal Problem: Major depressive disorder, recurrent severe without psychotic features (HCC) Diagnosis: Principal Problem:   Major depressive disorder, recurrent severe without psychotic features (HCC)  Total Time spent with patient:  I personally spent 35 minutes on the unit in direct patient care. The direct patient care time included face-to-face time with the patient, reviewing the patient's chart, communicating with other professionals, and coordinating care. Greater than 50% of this time was spent in counseling or coordinating care with the patient regarding goals of hospitalization, psycho-education, and  discharge planning needs.   Past Psychiatric History: MDD, and 1 Prior Suicide Attempt (jump off a roof 2021), a history of Self Injurious Behavior (cutting), and 1 Prior Psychiatric Hospitalization [facility]- 2021) and Residential Stay [facility] [place]  Past Medical History:  Past Medical History:  Diagnosis Date   Major depressive disorder    History reviewed. No pertinent surgical history. Family History: History reviewed. No pertinent family history. Family Psychiatric  History: Adopted at young age so does not know Half-Brother: Bipolar Disorder, Substance Abuse Social History:  Social History   Substance and Sexual Activity  Alcohol Use Never     Social History   Substance and Sexual Activity  Drug Use Never    Social History   Socioeconomic History   Marital status: Single    Spouse name: Not on file   Number of children: Not on file   Years of education: Not on file   Highest education level: Not on file  Occupational History   Not on file  Tobacco Use   Smoking status: Never   Smokeless tobacco: Never  Vaping Use   Vaping status: Never Used  Substance and Sexual Activity   Alcohol use: Never   Drug use: Never   Sexual activity: Not on file  Other Topics Concern   Not on file  Social History Narrative   Not on file   Social Determinants of Health   Financial Resource Strain: Not on file  Food Insecurity: No Food Insecurity (02/28/2023)   Hunger Vital Sign    Worried About  Running Out of Food in the Last Year: Never true    Ran Out of Food in the Last Year: Never true  Transportation Needs: No Transportation Needs (02/28/2023)   PRAPARE - Administrator, Civil Service (Medical): No    Lack of Transportation (Non-Medical): No  Physical Activity: Not on file  Stress: Not on file  Social Connections: Unknown (01/16/2023)   Received from [facility]   Social Network    Social Network: Not on file   Additional Social History:                          Sleep: Good  Appetite:  Good  Current Medications: Current Facility-Administered Medications  Medication Dose Route Frequency Provider Last Rate Last Admin   acetaminophen (TYLENOL) tablet 650 mg  650 mg Oral Q6H PRN Dahlia Byes C, NP       alum & mag hydroxide-simeth (MAALOX/MYLANTA) 200-200-20 MG/5ML suspension 30 mL  30 mL Oral Q4H PRN Welford Roche, Josephine C, NP       ARIPiprazole (ABILIFY) tablet 5 mg  5 mg Oral Daily Horacio Werth, Mardelle Matte, MD   5 mg at 03/03/23 2952   FLUoxetine (PROZAC) capsule 40 mg  40 mg Oral Daily Dahlia Byes C, NP   40 mg at 03/03/23 8413   hydrOXYzine (ATARAX) tablet 25 mg  25 mg Oral TID PRN Earney Navy, NP       OLANZapine zydis (ZYPREXA) disintegrating tablet 5 mg  5 mg Oral Q8H PRN Hartley Urton, Mardelle Matte, MD       And   LORazepam (ATIVAN) tablet 1 mg  1 mg Oral PRN Lauro Franklin, MD       And   ziprasidone (GEODON) injection 20 mg  20 mg Intramuscular PRN Ramces Shomaker, Mardelle Matte, MD       magnesium hydroxide (MILK OF MAGNESIA) suspension 30 mL  30 mL Oral Daily PRN Earney Navy, NP       senna-docusate (Senokot-S) tablet 1 tablet  1 tablet Oral Once Lauro Franklin, MD       traZODone (DESYREL) tablet 50 mg  50 mg Oral QHS PRN Lauro Franklin, MD        Lab Results:  Results for orders placed or performed during the hospital encounter of 02/28/23 (from the past 48 hour(s))  Lipid panel     Status: Abnormal   Collection Time: 03/03/23  6:31 AM  Result Value Ref Range   Cholesterol 217 (H) 0 - 200 mg/dL   Triglycerides 82 <244 mg/dL   HDL 39 (L) >01 mg/dL   Total CHOL/HDL Ratio 5.6 RATIO   VLDL 16 0 - 40 mg/dL   LDL Cholesterol 027 (H) 0 - 99 mg/dL    Comment:        Total Cholesterol/HDL:CHD Risk Coronary Heart Disease Risk Table                     Men   Women  1/2 Average Risk   3.4   3.3  Average Risk       5.0   4.4  2 X Average Risk   9.6   7.1  3 X Average Risk  23.4   11.0         Use the calculated Patient Ratio above and the CHD Risk Table to determine the patient's CHD Risk.        ATP III CLASSIFICATION (LDL):  <  100     mg/dL   Optimal  829-562  mg/dL   Near or Above                    Optimal  130-159  mg/dL   Borderline  130-865  mg/dL   High  >784     mg/dL   Very High Performed at [facility], [address] [place], [place] [address]   TSH     Status: None   Collection Time: 03/03/23  6:31 AM  Result Value Ref Range   TSH 1.820 0.350 - 4.500 uIU/mL    Comment: Performed by a 3rd Generation assay with a functional sensitivity of <=0.01 uIU/mL. Performed at [facility], [address], [place], [place] [address]      Blood Alcohol level:  Lab Results  Component Value Date   [facility] <10 02/28/2023   ETH <10 11/27/2020    Metabolic Disorder Labs: Lab Results  Component Value Date   HGBA1C 5.0 07/07/2021   MPG 96.8 07/07/2021   No results found for: "PROLACTIN" Lab Results  Component Value Date   CHOL 217 (H) 03/03/2023   TRIG 82 03/03/2023   HDL 39 (L) 03/03/2023   CHOLHDL 5.6 03/03/2023   VLDL 16 03/03/2023   LDLCALC 162 (H) 03/03/2023   LDLCALC 138 (H) 07/07/2021    Physical Findings: AIMS: Facial and Oral Movements Muscles of Facial Expression: None, normal Lips and Perioral Area: None, normal Jaw: None, normal Tongue: None, normal,Extremity Movements Upper (arms, wrists, hands, fingers): None, normal Lower (legs, knees, ankles, toes): None, normal, Trunk Movements Neck, shoulders, hips: None, normal, Overall Severity Severity of abnormal movements (highest score from questions above): None, normal Incapacitation due to abnormal movements: None, normal Patient's awareness of abnormal movements (rate only patient's report): No Awareness, Dental Status Current problems with teeth and/or dentures?: No Does patient usually wear dentures?: No  CIWA:    COWS:      Musculoskeletal: Strength & Muscle Tone: within normal limits Gait & Station: normal Patient leans: N/A  Psychiatric Specialty Exam:  Presentation  General Appearance:  Appropriate for Environment; Casual  Eye Contact: Good  Speech: Clear and Coherent; Normal Rate  Speech Volume: Normal  Handedness: Right   Mood and Affect  Mood: -- ("ok")  Affect: Appropriate; Congruent   Thought Process  Thought Processes: Coherent; Goal Directed  Descriptions of Associations:Intact  Orientation:Full (Time, Place and Person)  Thought Content:Logical; WDL  History of Schizophrenia/Schizoaffective disorder:No data recorded Duration of Psychotic Symptoms:No data recorded Hallucinations:Hallucinations: None  Ideas of Reference:None  Suicidal Thoughts:Suicidal Thoughts: No  Homicidal Thoughts:Homicidal Thoughts: No   Sensorium  Memory: Immediate Good; Recent Good  Judgment: Fair  Insight: Fair   Art therapist  Concentration: Good  Attention Span: Good  Recall: Good  Fund of Knowledge: Good  Language: Good   Psychomotor Activity  Psychomotor Activity: Psychomotor Activity: Normal   Assets  Assets: Communication Skills; Desire for Improvement; Physical Health; Resilience   Sleep  Sleep: Sleep: Good Number of Hours of Sleep: 8.75    Physical Exam: Physical Exam Vitals and nursing note reviewed.  Constitutional:      General: He is not in acute distress.    Appearance: Normal appearance. He is normal weight. He is not ill-appearing or toxic-appearing.  HENT:     Head: Normocephalic and atraumatic.  Pulmonary:     Effort: Pulmonary effort is normal.  Musculoskeletal:  General: Normal range of motion.  Neurological:     General: No focal deficit present.     Mental Status: He is alert.    Review of Systems  Respiratory:  Negative for cough and shortness of breath.   Cardiovascular:  Negative for chest pain.   Gastrointestinal:  Positive for constipation. Negative for abdominal pain, diarrhea, nausea and vomiting.  Neurological:  Negative for dizziness, weakness and headaches.  Psychiatric/Behavioral:  Negative for depression, hallucinations and suicidal ideas. The patient is not nervous/anxious.    Blood pressure 110/66, pulse 97, temperature 97.6 F (36.4 C), temperature source Oral, resp. rate 18, height 6\' 3"  (1.905 m), weight 102.5 kg, SpO2 98%. Body mass index is 28.25 kg/m.   Treatment Plan Summary: Daily contact with patient to assess and evaluate symptoms and progress in treatment and Medication management  Zykee Loya is a 22 yr old male who presented to [facility] on 8/4 after a Suicide Attempt (cutting arm with knife), he was admitted to [facility] on 8/5.  PPHx is significant for MDD, and 1 Prior Suicide Attempt (jump off a roof 2021), a history of Self Injurious Behavior (cutting), and 1 Prior Psychiatric Hospitalization [facility]- 2021) and Residential Stay [facility] [place]     Oren has tolerated starting the Abilify without issue so we will continue with the increase scheduled for this morning.   He has not had a BM in 3 days so we will start 2 doses of Senokot.  We will not make any other changes to his medication at this time.  His mother is going to visit him tonight and if she thinks he is at baseline we will plan for discharge tomorrow.  We will continue to monitor.     MDD, recurrent, severe, w.out Psychosis (r/o IED, r/o Bipolar Disorder): -Continue Prozac 40 mg daily for depression -Increase Abilify to 5 mg daily today for mood stability and augmentation -Continue Agitation Protocol: Zyprexa/Ativan/Geodon    Constipation: -Start Senokot-S 1 tablet BID for 2 doses    -Continue PRN's: Tylenol, Maalox, Atarax, Milk of Magnesia, Trazodone   Lauro Franklin, MD 03/03/2023, 9:03 AM

## 2023-03-03 NOTE — Plan of Care (Signed)
  Problem: Activity: Goal: Interest or engagement in activities will improve Outcome: Progressing   Problem: Coping: Goal: Ability to verbalize frustrations and anger appropriately will improve Outcome: Progressing   Problem: Safety: Goal: Periods of time without injury will increase Outcome: Progressing   

## 2023-03-03 NOTE — Group Note (Unsigned)
Date:  03/03/2023 Time:  9:09 AM  Group Topic/Focus:  Goals Group:   The focus of this group is to help patients establish daily goals to achieve during treatment and discuss how the patient can incorporate goal setting into their daily lives to aide in recovery.     Participation Level:  {BHH PARTICIPATION UJWJX:91478}  Participation Quality:  {BHH PARTICIPATION QUALITY:22265}  Affect:  {BHH AFFECT:22266}  Cognitive:  {BHH COGNITIVE:22267}  Insight: {BHH Insight2:20797}  Engagement in Group:  {BHH ENGAGEMENT IN GNFAO:13086}  Modes of Intervention:  {BHH MODES OF INTERVENTION:22269}  Additional Comments:  ***  Harriet Masson 03/03/2023, 9:09 AM

## 2023-03-03 NOTE — BHH Group Notes (Signed)
BHH Group Notes:  (Nursing/MHT/Case Management/Adjunct)  Date:  03/03/2023  Time:  9:01 PM  Type of Therapy:   Wrap-up  Participation Level:  Active  Participation Quality:  Appropriate  Affect:  Appropriate  Cognitive:  Appropriate  Insight:  Appropriate  Engagement in Group:  Engaged  Modes of Intervention:  Education  Summary of Progress/Problems: Pt reports he didn't have a goal. Rates day 8/10.  Noah Delaine 03/03/2023, 9:01 PM

## 2023-03-04 DIAGNOSIS — F332 Major depressive disorder, recurrent severe without psychotic features: Principal | ICD-10-CM

## 2023-03-04 MED ORDER — MAGNESIUM CITRATE PO SOLN
1.0000 | Freq: Once | ORAL | Status: AC
  Administered 2023-03-04: 1 via ORAL
  Filled 2023-03-04 (×2): qty 296

## 2023-03-04 MED ORDER — FLUOXETINE HCL 40 MG PO CAPS: 40.0000 mg | ORAL_CAPSULE | Freq: Every day | ORAL | 0 refills | Status: DC

## 2023-03-04 MED ORDER — ARIPIPRAZOLE 5 MG PO TABS: 5.0000 mg | ORAL_TABLET | Freq: Every day | ORAL | 0 refills | Status: DC
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
## 2023-03-04 NOTE — Progress Notes (Signed)
   03/04/23 0553  15 Minute Checks  Location Bedroom  Visual Appearance Calm  Behavior Composed  Sleep (Behavioral Health Patients Only)  Calculate sleep? (Click Yes once per 24 hr at 0600 safety check) Yes  Documented sleep last 24 hours 9.75

## 2023-03-04 NOTE — Progress Notes (Signed)
Pt reported that Mag Citrate was very effective.

## 2023-03-04 NOTE — Discharge Summary (Signed)
Physician Discharge Summary Note  Patient:  Vincent Frank is an 22 y.o., male MRN:  [id number] DOB:  [birth year] Patient phone:  [phone] (home)  Patient address:   [address] [place] [facility] [address],  Total Time spent with patient: 20 minutes  Date of Admission:  02/28/2023 Date of Discharge: 03/04/2023  Reason for Admission:    Vincent Frank is a 22 yr old male who presented to [facility] on 8/4 after a Suicide Attempt (cutting arm with knife), he was admitted to [facility] on 8/5. PPHx is significant for MDD, and 1 Prior Suicide Attempt (jump off a roof 2021), a history of Self Injurious Behavior (cutting), and 1 Prior Psychiatric Hospitalization [facility]- 2021) and Residential Stay [facility] [place]   Principal Problem: Major depressive disorder, recurrent severe without psychotic features [facility]) Discharge Diagnoses: Principal Problem:   Major depressive disorder, recurrent severe without psychotic features [facility])   Past Psychiatric History: MDD, and 1 Prior Suicide Attempt (jump off a roof 2021), a history of Self Injurious Behavior (cutting), and 1 Prior Psychiatric Hospitalization [facility]- 2021) and [place]idential Stay [facility] [place]   Past Medical History:  Past Medical History:  Diagnosis Date   Major depressive disorder    History reviewed. No pertinent surgical history. Family History: History reviewed. No pertinent family history. Family Psychiatric  History: Adopted at young age so does not know Half-Brother: Bipolar Disorder, Substance Abuse Social History:  Social History   Substance and Sexual Activity  Alcohol Use Never     Social History   Substance and Sexual Activity  Drug Use Never    Social History   Socioeconomic History   Marital status: Single    Spouse name: Not on file   Number of children: Not on file   Years of education: Not on file   Highest education level: Not on file  Occupational History   Not on file  Tobacco Use    Smoking status: Never   Smokeless tobacco: Never  Vaping Use   Vaping status: Never Used  Substance and Sexual Activity   Alcohol use: Never   Drug use: Never   Sexual activity: Not on file  Other Topics Concern   Not on file  Social History Narrative   Not on file   Social Determinants of Health   Financial Resource Strain: Not on file  Food Insecurity: No Food Insecurity (02/28/2023)   Hunger Vital Sign    Worried About Running Out of Food in the Last Year: Never true    Ran Out of Food in the Last Year: Never true  Transportation Needs: No Transportation Needs (02/28/2023)   PRAPARE - Administrator, Civil Service (Medical): No    Lack of Transportation (Non-Medical): No  Physical Activity: Not on file  Stress: Not on file  Social Connections: Unknown (01/16/2023)   Received from [facility]   Social Network    Social Network: Not on file    Hospital Course:   During the patient's hospitalization, patient had extensive initial psychiatric evaluation, and follow-up psychiatric evaluations every day.  Psychiatric diagnoses provided upon initial assessment: MDD, Recurrent, Severe, w/out Psychosis   Patient's psychiatric medications were adjusted on admission: Continued on Prozac. Started on Abilify.   During the hospitalization, other adjustments were made to the patient's psychiatric medication regimen: Abilify was titrated during hospitalization.   Patient's care was discussed during the interdisciplinary team meeting every day during the hospitalization.  The patient is not having  side effects to prescribed psychiatric medication.  Gradually, patient started adjusting to milieu. The patient was evaluated each day by a clinical provider to ascertain response to treatment. Improvement was noted by the patient's report of decreasing symptoms, improved sleep and appetite, affect, medication tolerance, behavior, and participation in unit programming.  Patient was  asked each day to complete a self inventory noting mood, mental status, pain, new symptoms, anxiety and concerns.   Symptoms were reported as significantly decreased or resolved completely by discharge.  The patient reports that their mood is stable.  The patient denied having suicidal thoughts for more than 48 hours prior to discharge.  Patient denies having homicidal thoughts.  Patient denies having auditory hallucinations.  Patient denies any visual hallucinations or other symptoms of psychosis.  The patient was motivated to continue taking medication with a goal of continued improvement in mental health.   The patient reports their target psychiatric symptoms of depression and SI responded well to the psychiatric medications, and the patient reports overall benefit other psychiatric hospitalization. Supportive psychotherapy was provided to the patient. The patient also participated in regular group therapy while hospitalized. Coping skills, problem solving as well as relaxation therapies were also part of the unit programming.  Labs were reviewed with the patient, and abnormal results were discussed with the patient.  The patient is able to verbalize their individual safety plan to this provider.  # It is recommended to the patient to continue psychiatric medications as prescribed, after discharge from the hospital.    # It is recommended to the patient to follow up with your outpatient psychiatric provider and PCP.  # It was discussed with the patient, the impact of alcohol, drugs, tobacco have been there overall psychiatric and medical wellbeing, and total abstinence from substance use was recommended the patient.ed.  # Prescriptions provided or sent directly to preferred pharmacy at discharge. Patient agreeable to plan. Given opportunity to ask questions. Appears to feel comfortable with discharge.    # In the event of worsening symptoms, the patient is instructed to call the crisis  hotline, 911 and or go to the nearest ED for appropriate evaluation and treatment of symptoms. To follow-up with primary care provider for other medical issues, concerns and or health care needs  # Patient was discharged home with a plan to follow up as noted below.    On day of discharge he reports that he is feeling ready for discharge.  He reports continuing to have constipation and that he is now having some stomach pain.  After a single dose of Mag Citrate he was no longer constipated or had any pain.  He reports no side effects from his medications.  He reports no SI, HI, or AVH.  He reports sleep is good.  He reports appetite is doing good.  He reports no other concerns present.  He was discharged home to his current.   Called patient's mother.  She reports that she visited him last night and that his affect was much better.  She reports that he was able to remain calm and they did not get into any arguments.  She reports she has no safety concerns with him coming home.  She reports that per previous safety planning all weapons have been removed from the house, knives locked up, and medications locked up.  She reports in the concerns present.   Physical Findings: AIMS: Facial and Oral Movements Muscles of Facial Expression: None, normal Lips and Perioral Area: None,  normal Jaw: None, normal Tongue: None, normal,Extremity Movements Upper (arms, wrists, hands, fingers): None, normal Lower (legs, knees, ankles, toes): None, normal, Trunk Movements Neck, shoulders, hips: None, normal, Overall Severity Severity of abnormal movements (highest score from questions above): None, normal Incapacitation due to abnormal movements: None, normal Patient's awareness of abnormal movements (rate only patient's report): No Awareness, Dental Status Current problems with teeth and/or dentures?: No Does patient usually wear dentures?: No  CIWA:    COWS:     Musculoskeletal: Strength & Muscle Tone:  within normal limits Gait & Station: normal Patient leans: N/A   Psychiatric Specialty Exam:  Presentation  General Appearance:  Appropriate for Environment; Casual  Eye Contact: Good  Speech: Clear and Coherent; Normal Rate  Speech Volume: Normal  Handedness: Right   Mood and Affect  Mood: Euthymic  Affect: Congruent; Appropriate   Thought Process  Thought Processes: Coherent; Goal Directed  Descriptions of Associations:Intact  Orientation:Full (Time, Place and Person)  Thought Content:Logical; WDL  History of Schizophrenia/Schizoaffective disorder:No data recorded Duration of Psychotic Symptoms:No data recorded Hallucinations:Hallucinations: None  Ideas of Reference:None  Suicidal Thoughts:Suicidal Thoughts: No  Homicidal Thoughts:Homicidal Thoughts: No   Sensorium  Memory: Immediate Good; Recent Good  Judgment: Good  Insight: Good   Executive Functions  Concentration: Good  Attention Span: Good  Recall: Good  Fund of Knowledge: Good  Language: Good   Psychomotor Activity  Psychomotor Activity: Psychomotor Activity: Normal   Assets  Assets: Communication Skills; Desire for Improvement; Physical Health; Resilience; Housing   Sleep  Sleep: Sleep: Good Number of Hours of Sleep: 9.75    Physical Exam: Physical Exam Vitals and nursing note reviewed.  Constitutional:      General: He is not in acute distress.    Appearance: Normal appearance. He is normal weight. He is not ill-appearing or toxic-appearing.  HENT:     Head: Normocephalic and atraumatic.  Pulmonary:     Effort: Pulmonary effort is normal.  Musculoskeletal:        General: Normal range of motion.  Neurological:     General: No focal deficit present.     Mental Status: He is alert.    Review of Systems  Respiratory:  Negative for cough and shortness of breath.   Cardiovascular:  Negative for chest pain.  Gastrointestinal:  Negative for  abdominal pain, constipation, diarrhea, nausea and vomiting.  Neurological:  Negative for dizziness, weakness and headaches.  Psychiatric/Behavioral:  Negative for depression, hallucinations and suicidal ideas. The patient is not nervous/anxious.    Blood pressure (!) 122/100, pulse (!) 106, temperature (!) 97.4 F (36.3 C), temperature source Oral, resp. rate 16, height 6\' 3"  (1.905 m), weight 102.5 kg, SpO2 100%. Body mass index is 28.25 kg/m.   Social History   Tobacco Use  Smoking Status Never  Smokeless Tobacco Never   Tobacco Cessation:  N/A, patient does not currently use tobacco products   Blood Alcohol level:  Lab Results  Component Value Date   ETH <10 02/28/2023   ETH <10 11/27/2020    Metabolic Disorder Labs:  Lab Results  Component Value Date   HGBA1C 5.2 03/03/2023   MPG 102.54 03/03/2023   MPG 96.8 07/07/2021   No results found for: "PROLACTIN" Lab Results  Component Value Date   CHOL 217 (H) 03/03/2023   TRIG 82 03/03/2023   HDL 39 (L) 03/03/2023   CHOLHDL 5.6 03/03/2023   VLDL 16 03/03/2023   LDLCALC 162 (H) 03/03/2023   LDLCALC 138 (H) 07/07/2021  See Psychiatric Specialty Exam and Suicide Risk Assessment completed by Attending Physician prior to discharge.  Discharge destination:  Home  Is patient on multiple antipsychotic therapies at discharge:  No   Has Patient had three or more failed trials of antipsychotic monotherapy by history:  No  Recommended Plan for Multiple Antipsychotic Therapies: NA  Discharge Instructions     Diet - low sodium heart healthy   Complete by: As directed    Increase activity slowly   Complete by: As directed       Allergies as of 03/04/2023       Reactions   Bee Venom Anaphylaxis   Hornet Venom Anaphylaxis, Itching   Haldol [haloperidol]    Per father dystonic reaction with  muscle contractures.        Medication List     STOP taking these medications    gabapentin 100 MG capsule Commonly  known as: NEURONTIN   guanFACINE 1 MG tablet Commonly known as: TENEX       TAKE these medications      Indication  ARIPiprazole 5 MG tablet Commonly known as: ABILIFY Take 1 tablet (5 mg total) by mouth daily. Start taking on: March 05, 2023  Indication: Major Depressive Disorder   EPINEPHrine 0.3 mg/0.3 mL Soaj injection Commonly known as: EPI-PEN Inject 0.3 mg into the muscle as needed for anaphylaxis.  Indication: Life-Threatening Hypersensitivity Reaction   FLUoxetine 40 MG capsule Commonly known as: PROZAC Take 1 capsule (40 mg total) by mouth daily. Start taking on: March 05, 2023 What changed: when to take this  Indication: Major Depressive Disorder        Follow-up Information     [facility] Follow up on 03/11/2023.   Why: You have a hospital follow up appointment for therapy services on 03/11/23 at 9:30 am.  The appointment will be Virtual via the telephone. Contact information: [address]  [address] [place] [address] [phone]         Windell Moment, MD. Go on 04/08/2023.   Why: You have an appointment for medication management services on 04/08/23 at 1:00 pm.  This appointment will be held in person. Contact information: [address] [place], [place] [address]  Office: [phone]  Fax: [phone]                Follow-up recommendations/Comments:    Activity: as tolerated   Diet: heart healthy   Other: -Follow-up with your outpatient psychiatric provider -instructions on appointment date, time, and address (location) are provided to you in discharge paperwork.   -Take your psychiatric medications as prescribed at discharge - instructions are provided to you in the discharge paperwork   -Follow-up with outpatient primary care doctor and other specialists -for management of chronic medical disease, including: Elevated lipid panel.   -Testing: Follow-up with outpatient provider for abnormal lab results: Elevated lipid  panel.   -Recommend abstinence from alcohol, tobacco, and other illicit drug use at discharge.    -If your psychiatric symptoms recur, worsen, or if you have side effects to your psychiatric medications, call your outpatient psychiatric provider, 911, [phone] or go to the nearest emergency department.   -If suicidal thoughts recur, call your outpatient psychiatric provider, 911, [phone] or go to the nearest emergency department.  Signed: Lauro Franklin, MD 03/04/2023, 9:45 AM

## 2023-03-04 NOTE — Progress Notes (Signed)
  [facility] Adult Case Management Discharge Plan :  Will you be returning to the same living situation after discharge:  Yes,  patient will be returning back home with family  At discharge, do you have transportation home?: Yes,  Patient mom will be picking up around 11:15AM  Do you have the ability to pay for your medications: Yes,  [facility]   Release of information consent forms completed and in the chart;  Patient's signature needed at discharge.  Patient to Follow up at:  Follow-up Information     [facility] Follow up on 03/11/2023.   Why: You have a hospital follow up appointment for therapy services on 03/11/23 at 9:30 am.  The appointment will be Virtual via the telephone. Contact information: [address]  [address] [place] [address] [phone]         Windell Moment, MD. Go on 04/08/2023.   Why: You have an appointment for medication management services on 04/08/23 at 1:00 pm.  This appointment will be held in person. Contact information: [address] [place], [place] [address]  Office: [phone]  Fax: [phone]                Next level of care provider has access to [facility] Link:no  Safety Planning and Suicide Prevention discussed: Yes,  mother Zamari Aurigemma [phone]     Has patient been referred to the Quitline?: Patient does not use tobacco/nicotine products  Patient has been referred for addiction treatment: No known substance use disorder.  Isabella Bowens, LCSWA 03/04/2023, 9:58 AM

## 2023-03-04 NOTE — Progress Notes (Signed)
   03/03/23 2105  Psych Admission Type (Psych Patients Only)  Admission Status Voluntary  Psychosocial Assessment  Patient Complaints None  Eye Contact Fair  Facial Expression Animated  Affect Appropriate to circumstance  Speech Logical/coherent  Interaction Minimal  Motor Activity Other (Comment) (WDL)  Appearance/Hygiene Unremarkable  Behavior Characteristics Cooperative;Calm  Mood Pleasant  Thought Process  Coherency WDL  Content WDL  Delusions None reported or observed  Perception WDL  Hallucination None reported or observed  Judgment Impaired  Confusion None  Danger to Self  Current suicidal ideation? Denies  Agreement Not to Harm Self Yes  Description of Agreement verbal  Danger to Others  Danger to Others None reported or observed   Pt was offered support and encouragement. Q 15 minute checks were done for safety. Pt attended group. Pt was visible in the dayroom interacting well with peers. Pt receptive to treatment and safety maintained on unit.

## 2023-03-04 NOTE — Progress Notes (Signed)
D. Pt was friendly upon approach at the med window- voiced concern that he hadn't had a BM in several days. Other wise pt had no complaints, reported that he slept well, was eating well, and denied pain, SI/HI and A/VH. Pt has been appropriate on the unit- going to groups, interacting well with peers and staff. .  A. Labs and vitals monitored. Pt given and educated on medications. Pt given Mag Citrate for complaints of constipation per MD order.  Pt supported emotionally and encouraged to express concerns and ask questions.   R. Pt remains safe with 15 minute checks. Will continue POC.

## 2023-03-04 NOTE — Plan of Care (Signed)
  Problem: Education: Goal: Emotional status will improve Outcome: Progressing Goal: Mental status will improve Outcome: Progressing   Problem: Activity: Goal: Interest or engagement in activities will improve Outcome: Progressing Goal: Sleeping patterns will improve Outcome: Progressing   Problem: Safety: Goal: Periods of time without injury will increase Outcome: Progressing   Problem: Self-Concept: Goal: Will verbalize positive feelings about self Outcome: Progressing   Problem: Coping: Goal: Ability to interact with others will improve Outcome: Progressing

## 2023-03-04 NOTE — Progress Notes (Signed)
Pt discharged to lobby where father was waiting for him. Pt was stable and appreciative at that time. All papers and prescriptions were given and valuables returned. Suicide safety plan completed and copy given to patient. Verbal understanding expressed. Denies SI/HI and A/VH. Pt given opportunity to express concerns and ask questions.

## 2023-03-15 ENCOUNTER — Emergency Department (HOSPITAL_COMMUNITY)
Admission: EM | Admit: 2023-03-15 | Discharge: 2023-03-16 | Disposition: A | Discharge status: Other Acute Inpatient Hospital | Payer: BC Managed Care – PPO | Attending: Emergency Medicine | Admitting: Emergency Medicine

## 2023-03-15 DIAGNOSIS — S51812A Laceration without foreign body of left forearm, initial encounter: Secondary | ICD-10-CM | POA: Insufficient documentation

## 2023-03-15 DIAGNOSIS — R45851 Suicidal ideations: Secondary | ICD-10-CM | POA: Insufficient documentation

## 2023-03-15 DIAGNOSIS — F6813 Factitious disorder with combined psychological and physical signs and symptoms: Secondary | ICD-10-CM | POA: Insufficient documentation

## 2023-03-15 DIAGNOSIS — Z23 Encounter for immunization: Secondary | ICD-10-CM | POA: Insufficient documentation

## 2023-03-15 DIAGNOSIS — X781XXA Intentional self-harm by knife, initial encounter: Secondary | ICD-10-CM | POA: Insufficient documentation

## 2023-03-15 DIAGNOSIS — Z8659 Personal history of other mental and behavioral disorders: Secondary | ICD-10-CM

## 2023-03-15 DIAGNOSIS — F332 Major depressive disorder, recurrent severe without psychotic features: Secondary | ICD-10-CM | POA: Diagnosis present

## 2023-03-15 DIAGNOSIS — T1491XA Suicide attempt, initial encounter: Secondary | ICD-10-CM

## 2023-03-15 DIAGNOSIS — X789XXA Intentional self-harm by unspecified sharp object, initial encounter: Secondary | ICD-10-CM | POA: Diagnosis present

## 2023-03-15 NOTE — ED Triage Notes (Addendum)
PER EMS: pt brought in for suicide attempt by cutting himself with serrated knife. Self inflected deep horizontal laceration to left wrist and superficial lacerations to bilateral shins. +PMS in legs, but unable to move his fingers on left hand. EMS applied tourniquet to left upper arm to control bleeding at 2315.  BP- 140/112, HR-82, O2-98%, CBG-104

## 2023-03-16 ENCOUNTER — Encounter (HOSPITAL_COMMUNITY): Payer: Self-pay

## 2023-03-16 ENCOUNTER — Encounter (HOSPITAL_COMMUNITY): Payer: Self-pay | Admitting: Adult Health

## 2023-03-16 ENCOUNTER — Other Ambulatory Visit: Payer: Self-pay

## 2023-03-16 ENCOUNTER — Inpatient Hospital Stay (HOSPITAL_COMMUNITY)
Admission: AD | Admit: 2023-03-16 | Discharge: 2023-03-20 | DRG: 885 | Disposition: A | Discharge status: Home / Self Care | Payer: BC Managed Care – PPO | Source: Intra-hospital | Attending: Psychiatry | Admitting: Psychiatry

## 2023-03-16 ENCOUNTER — Emergency Department (HOSPITAL_COMMUNITY): Payer: BC Managed Care – PPO

## 2023-03-16 DIAGNOSIS — Z79899 Other long term (current) drug therapy: Secondary | ICD-10-CM

## 2023-03-16 DIAGNOSIS — S5422XA Injury of radial nerve at forearm level, left arm, initial encounter: Secondary | ICD-10-CM | POA: Diagnosis present

## 2023-03-16 DIAGNOSIS — F419 Anxiety disorder, unspecified: Secondary | ICD-10-CM | POA: Diagnosis present

## 2023-03-16 DIAGNOSIS — R4587 Impulsiveness: Secondary | ICD-10-CM | POA: Diagnosis present

## 2023-03-16 DIAGNOSIS — Z885 Allergy status to narcotic agent status: Secondary | ICD-10-CM

## 2023-03-16 DIAGNOSIS — G4700 Insomnia, unspecified: Secondary | ICD-10-CM | POA: Diagnosis present

## 2023-03-16 DIAGNOSIS — S51812A Laceration without foreign body of left forearm, initial encounter: Secondary | ICD-10-CM | POA: Diagnosis present

## 2023-03-16 DIAGNOSIS — S81811A Laceration without foreign body, right lower leg, initial encounter: Secondary | ICD-10-CM | POA: Diagnosis present

## 2023-03-16 DIAGNOSIS — Z9151 Personal history of suicidal behavior: Secondary | ICD-10-CM | POA: Diagnosis not present

## 2023-03-16 DIAGNOSIS — F332 Major depressive disorder, recurrent severe without psychotic features: Secondary | ICD-10-CM

## 2023-03-16 DIAGNOSIS — S81812A Laceration without foreign body, left lower leg, initial encounter: Secondary | ICD-10-CM | POA: Diagnosis present

## 2023-03-16 DIAGNOSIS — S61512A Laceration without foreign body of left wrist, initial encounter: Secondary | ICD-10-CM | POA: Diagnosis present

## 2023-03-16 DIAGNOSIS — Z91030 Bee allergy status: Secondary | ICD-10-CM | POA: Diagnosis not present

## 2023-03-16 DIAGNOSIS — Z23 Encounter for immunization: Secondary | ICD-10-CM | POA: Diagnosis not present

## 2023-03-16 DIAGNOSIS — X789XXA Intentional self-harm by unspecified sharp object, initial encounter: Secondary | ICD-10-CM | POA: Diagnosis present

## 2023-03-16 LAB — COMPREHENSIVE METABOLIC PANEL
ALT: 23 U/L (ref 0–44)
AST: 28 U/L (ref 15–41)
Albumin: 4.1 g/dL (ref 3.5–5.0)
Alkaline Phosphatase: 72 U/L (ref 38–126)
Anion gap: 16 — ABNORMAL HIGH (ref 5–15)
BUN: 12 mg/dL (ref 6–20)
CO2: 19 mmol/L — ABNORMAL LOW (ref 22–32)
Calcium: 10.3 mg/dL (ref 8.9–10.3)
Chloride: 104 mmol/L (ref 98–111)
Creatinine, Ser: 1.1 mg/dL (ref 0.61–1.24)
GFR, Estimated: >60 mL/min (ref >60)
Glucose, Bld: 107 mg/dL — ABNORMAL HIGH (ref 70–99)
Potassium: 3.5 mmol/L (ref 3.5–5.1)
Sodium: 139 mmol/L (ref 135–145)
Total Bilirubin: 0.3 mg/dL (ref 0.3–1.2)
Total Protein: 6.7 g/dL (ref 6.5–8.1)

## 2023-03-16 LAB — CBC WITH DIFFERENTIAL/PLATELET
Abs Immature Granulocytes: 0.03 10*3/uL (ref 0.00–0.07)
Basophils Absolute: 0.1 10*3/uL (ref 0.0–0.1)
Basophils Relative: 1 %
Eosinophils Absolute: 0.5 10*3/uL (ref 0.0–0.5)
Eosinophils Relative: 5 %
HCT: 40.6 % (ref 39.0–52.0)
Hemoglobin: 14.1 g/dL (ref 13.0–17.0)
Immature Granulocytes: 0 %
Lymphocytes Relative: 27 %
Lymphs Abs: 2.5 10*3/uL (ref 0.7–4.0)
MCH: 27.8 pg (ref 26.0–34.0)
MCHC: 34.7 g/dL (ref 30.0–36.0)
MCV: 80.1 fL (ref 80.0–100.0)
Monocytes Absolute: 0.8 10*3/uL (ref 0.1–1.0)
Monocytes Relative: 8 %
Neutro Abs: 5.4 10*3/uL (ref 1.7–7.7)
Neutrophils Relative %: 59 %
Platelets: 237 10*3/uL (ref 150–400)
RBC: 5.07 MIL/uL (ref 4.22–5.81)
RDW: 12.8 % (ref 11.5–15.5)
WBC: 9.2 10*3/uL (ref 4.0–10.5)
nRBC: 0 % (ref 0.0–0.2)

## 2023-03-16 LAB — I-STAT CHEM 8, ED
BUN: 13 mg/dL (ref 6–20)
Calcium, Ion: 1.13 mmol/L — ABNORMAL LOW (ref 1.15–1.40)
Chloride: 107 mmol/L (ref 98–111)
Creatinine, Ser: 1 mg/dL (ref 0.61–1.24)
Glucose, Bld: 107 mg/dL — ABNORMAL HIGH (ref 70–99)
HCT: 39 % (ref 39.0–52.0)
Hemoglobin: 13.3 g/dL (ref 13.0–17.0)
Potassium: 3.5 mmol/L (ref 3.5–5.1)
Sodium: 140 mmol/L (ref 135–145)
TCO2: 21 mmol/L — ABNORMAL LOW (ref 22–32)

## 2023-03-16 LAB — PROTIME-INR
INR: 1 (ref 0.8–1.2)
Prothrombin Time: 13.1 seconds (ref 11.4–15.2)

## 2023-03-16 LAB — TYPE AND SCREEN
ABO/RH(D): A POS
Antibody Screen: NEGATIVE

## 2023-03-16 LAB — ABO/RH: ABO/RH(D): A POS

## 2023-03-16 MED ORDER — ARIPIPRAZOLE 5 MG PO TABS: 5.0000 mg | ORAL_TABLET | Freq: Every day | ORAL | Status: DC

## 2023-03-16 MED ORDER — DIPHENHYDRAMINE HCL 50 MG/ML IJ SOLN: 50.0000 mg | Freq: Three times a day (TID) | INTRAMUSCULAR | Status: DC | PRN

## 2023-03-16 MED ORDER — LORAZEPAM 2 MG/ML IJ SOLN: 2.0000 mg | Freq: Three times a day (TID) | INTRAMUSCULAR | Status: DC | PRN

## 2023-03-16 MED ORDER — ACETAMINOPHEN 325 MG PO TABS
650.0000 mg | ORAL_TABLET | Freq: Four times a day (QID) | ORAL | Status: DC | PRN
  Administered 2023-03-17 – 2023-03-19 (×5): 650 mg via ORAL

## 2023-03-16 MED ORDER — CEPHALEXIN 500 MG PO CAPS
500.0000 mg | ORAL_CAPSULE | Freq: Three times a day (TID) | ORAL | Status: DC
  Administered 2023-03-16 – 2023-03-20 (×11): 500 mg via ORAL
  Filled 2023-03-16 (×19): qty 1

## 2023-03-16 MED ORDER — FLUOXETINE HCL 20 MG PO CAPS
40.0000 mg | ORAL_CAPSULE | Freq: Every day | ORAL | Status: DC
  Administered 2023-03-16: 40 mg via ORAL
  Filled 2023-03-16: qty 2

## 2023-03-16 MED ORDER — OXYCODONE-ACETAMINOPHEN 5-325 MG PO TABS
1.0000 | ORAL_TABLET | Freq: Three times a day (TID) | ORAL | Status: AC | PRN
  Administered 2023-03-16 – 2023-03-18 (×4): 1 via ORAL
  Filled 2023-03-16 (×4): qty 1

## 2023-03-16 MED ORDER — OXYCODONE-ACETAMINOPHEN 5-325 MG PO TABS: 1.0000 | ORAL_TABLET | Freq: Three times a day (TID) | ORAL | Status: DC | PRN

## 2023-03-16 MED ORDER — FLUOXETINE HCL 20 MG PO CAPS
40.0000 mg | ORAL_CAPSULE | Freq: Every day | ORAL | Status: DC
  Administered 2023-03-17: 40 mg via ORAL
  Filled 2023-03-16 (×3): qty 2

## 2023-03-16 MED ORDER — ARIPIPRAZOLE 10 MG PO TABS
10.0000 mg | ORAL_TABLET | Freq: Every day | ORAL | Status: DC
  Administered 2023-03-16: 10 mg via ORAL
  Filled 2023-03-16: qty 1

## 2023-03-16 MED ORDER — ARIPIPRAZOLE 5 MG PO TABS
5.0000 mg | ORAL_TABLET | Freq: Every day | ORAL | Status: DC
  Filled 2023-03-16 (×3): qty 1

## 2023-03-16 MED ORDER — ACETAMINOPHEN 325 MG PO TABS
650.0000 mg | ORAL_TABLET | Freq: Four times a day (QID) | ORAL | Status: DC | PRN
  Administered 2023-03-19 – 2023-03-20 (×2): 650 mg via ORAL
  Filled 2023-03-16 (×7): qty 2

## 2023-03-16 MED ORDER — ALUM & MAG HYDROXIDE-SIMETH 200-200-20 MG/5ML PO SUSP: 30.0000 mL | ORAL | Status: DC | PRN

## 2023-03-16 MED ORDER — CEFAZOLIN SODIUM-DEXTROSE 1-4 GM/50ML-% IV SOLN
1.0000 g | Freq: Once | INTRAVENOUS | Status: AC
  Administered 2023-03-16: 1 g via INTRAVENOUS
  Filled 2023-03-16: qty 50

## 2023-03-16 MED ORDER — TETANUS-DIPHTH-ACELL PERTUSSIS 5-2.5-18.5 LF-MCG/0.5 IM SUSY
0.5000 mL | PREFILLED_SYRINGE | Freq: Once | INTRAMUSCULAR | Status: AC
  Administered 2023-03-16: 0.5 mL via INTRAMUSCULAR
  Filled 2023-03-16: qty 0.5

## 2023-03-16 MED ORDER — FENTANYL CITRATE PF 50 MCG/ML IJ SOSY
PREFILLED_SYRINGE | INTRAMUSCULAR | Status: AC
  Administered 2023-03-16: 50 ug via INTRAVENOUS
  Filled 2023-03-16: qty 1

## 2023-03-16 MED ORDER — ZIPRASIDONE MESYLATE 20 MG IM SOLR: 20.0000 mg | Freq: Two times a day (BID) | INTRAMUSCULAR | Status: DC | PRN

## 2023-03-16 MED ORDER — MAGNESIUM HYDROXIDE 400 MG/5ML PO SUSP: 30.0000 mL | Freq: Every day | ORAL | Status: DC | PRN

## 2023-03-16 MED ORDER — IOHEXOL 350 MG/ML SOLN
75.0000 mL | Freq: Once | INTRAVENOUS | Status: AC | PRN
  Administered 2023-03-16: 75 mL via INTRAVENOUS

## 2023-03-16 MED ORDER — FENTANYL CITRATE PF 50 MCG/ML IJ SOSY: 50.0000 ug | PREFILLED_SYRINGE | Freq: Once | INTRAMUSCULAR | Status: DC

## 2023-03-16 MED ORDER — ACETAMINOPHEN 325 MG PO TABS: 650.0000 mg | ORAL_TABLET | Freq: Four times a day (QID) | ORAL | Status: DC | PRN

## 2023-03-16 MED ORDER — LORAZEPAM 1 MG PO TABS: 2.0000 mg | ORAL_TABLET | Freq: Three times a day (TID) | ORAL | Status: DC | PRN

## 2023-03-16 MED ORDER — CEPHALEXIN 250 MG PO CAPS
500.0000 mg | ORAL_CAPSULE | Freq: Three times a day (TID) | ORAL | Status: DC
  Administered 2023-03-16 (×2): 500 mg via ORAL
  Filled 2023-03-16 (×2): qty 2

## 2023-03-16 MED ORDER — DIPHENHYDRAMINE HCL 25 MG PO CAPS: 50.0000 mg | ORAL_CAPSULE | Freq: Three times a day (TID) | ORAL | Status: DC | PRN

## 2023-03-16 MED ORDER — FENTANYL CITRATE PF 50 MCG/ML IJ SOSY: 50.0000 ug | PREFILLED_SYRINGE | Freq: Once | INTRAMUSCULAR | Status: AC

## 2023-03-16 NOTE — TOC CAGE-AID Note (Signed)
Transition of Care [facility]) - CAGE-AID Screening   Patient Details  Name: Vincent Frank MRN: [id number] Date of Birth: Nov 22, 2000  [facility] of [facility]) CM/SW Contact:    Leota Sauers, RN Phone Number: 03/16/2023, 6:34 AM   Clinical Narrative:  Patient denies use of alcohol and illicit drugs. Resources not given at this time.   CAGE-AID Screening:    Have You Ever Felt You Ought to Cut Down on Your Drinking or Drug Use?: No Have People Annoyed You By Critizing Your Drinking Or Drug Use?: No Have You Felt Bad Or Guilty About Your Drinking Or Drug Use?: No Have You Ever Had a Drink or Used Drugs First Thing In The Morning to Steady Your Nerves or to Get Rid of a Hangover?: No CAGE-AID Score: 0  Substance Abuse Education Offered: No

## 2023-03-16 NOTE — Progress Notes (Signed)
Orthopedic Tech Progress Note Patient Details:  Vincent Frank 2000-11-19 [id number]  Patient ID: Vincent Frank, male   DOB: 2001-07-20, 22 y.o.   MRN: [id number] Level II; not currently needed. [place] A Orissa Arreaga 03/16/2023, 1:16 AM

## 2023-03-16 NOTE — ED Provider Notes (Signed)
[place] EMERGENCY DEPARTMENT AT [facility] Provider Note   CSN: [id number] Arrival date & time: 03/15/23  2354     History  Chief Complaint  Patient presents with   Suicide Attempt    Vincent Frank is a 22 y.o. male.  Patient presents via EMS after suicide attempt.  He slit his left wrist and cut his bilateral shins with a kitchen knife that was serrated just prior to evaluation.  He called EMS himself.  Admits this was self-harm.  Tourniquet was placed to left arm by fire prior to EMS arrival.  There was active bleeding from left forearm laceration.  He is unable to move his left hand.  Complains of pain to his left arm.  Vitals are stable for EMS.  No blood thinner use.  Denies any alcohol or drug use.  The history is provided by the patient and the EMS personnel.       Home Medications Prior to Admission medications   Medication Sig Start Date End Date Taking? Authorizing Provider  ARIPiprazole (ABILIFY) 5 MG tablet Take 1 tablet (5 mg total) by mouth daily. 03/05/23   Lauro Franklin, MD  EPINEPHrine 0.3 mg/0.3 mL IJ SOAJ injection Inject 0.3 mg into the muscle as needed for anaphylaxis. 03/06/22   Cathren Laine, MD  FLUoxetine (PROZAC) 40 MG capsule Take 1 capsule (40 mg total) by mouth daily. 03/05/23   Lauro Franklin, MD      Allergies    Bee venom, Hornet venom, and Haldol [haloperidol]    Review of Systems   Review of Systems  Constitutional:  Negative for activity change, appetite change and fever.  HENT:  Negative for congestion and sinus pain.   Respiratory:  Negative for cough, chest tightness and shortness of breath.   Cardiovascular:  Negative for chest pain.  Gastrointestinal:  Negative for abdominal pain, nausea and vomiting.  Genitourinary:  Negative for dysuria and hematuria.  Musculoskeletal:  Negative for arthralgias and myalgias.  Skin:  Positive for wound.  Neurological:  Negative for dizziness, weakness and headaches.    all other systems are negative except as noted in the HPI and PMH.    Physical Exam Updated Vital Signs BP (!) 140/79   Pulse 76   Resp 19   Ht 6\' 3"  (1.905 m)   Wt 102.1 kg   SpO2 100%   BMI 28.12 kg/m  Physical Exam Vitals and nursing note reviewed.  Constitutional:      General: He is not in acute distress.    Appearance: He is well-developed. He is diaphoretic.     Comments: Pale, diaphoretic  HENT:     Head: Normocephalic and atraumatic.     Mouth/Throat:     Pharynx: No oropharyngeal exudate.  Eyes:     Conjunctiva/sclera: Conjunctivae normal.     Pupils: Pupils are equal, round, and reactive to light.  Neck:     Comments: No meningismus. Cardiovascular:     Rate and Rhythm: Normal rate and regular rhythm.     Heart sounds: Normal heart sounds. No murmur heard. Pulmonary:     Effort: Pulmonary effort is normal. No respiratory distress.     Breath sounds: Normal breath sounds.  Abdominal:     Palpations: Abdomen is soft.     Tenderness: There is no abdominal tenderness. There is no guarding or rebound.  Musculoskeletal:        General: Tenderness and signs of injury present.     Cervical  back: Normal range of motion and neck supple.     Comments: There is a 4 cm laceration to the left radial forearm on dorsal side.  With tourniquet removed there is active nonpulsatile dark red bleeding that is difficult to control.  With Tourniquet removed, patient has intact radial pulse.  Intact cardinal hand movements and able to flex and extend wrist without difficulty. Some weakness in thumb extension with subjective paresthesias.  Weakness giving thumbs up. Able to make fist. Hand well perfused. Warm. Capillary refill intact.   Multiple superficial horizontal lacerations involving bilateral shins.  Skin:    General: Skin is warm.  Neurological:     Mental Status: He is alert and oriented to person, place, and time.     Cranial Nerves: No cranial nerve deficit.      Motor: No abnormal muscle tone.     Coordination: Coordination normal.     Comments:  5/5 strength throughout. CN 2-12 intact.Equal grip strength.   Psychiatric:        Behavior: Behavior normal.     ED Results / Procedures / Treatments   Labs (all labs ordered are listed, but only abnormal results are displayed) Labs Reviewed  COMPREHENSIVE METABOLIC PANEL - Abnormal; Notable for the following components:      Result Value   CO2 19 (*)    Glucose, Bld 107 (*)    Anion gap 16 (*)    All other components within normal limits  I-STAT CHEM 8, ED - Abnormal; Notable for the following components:   Glucose, Bld 107 (*)    Calcium, Ion 1.13 (*)    TCO2 21 (*)    All other components within normal limits  CBC WITH DIFFERENTIAL/PLATELET  PROTIME-INR  I-STAT CHEM 8, ED  TYPE AND SCREEN  ABO/RH    EKG None  Radiology CT ANGIO UP EXTREM LEFT W &/OR WO CONTAST  Result Date: 03/16/2023 CLINICAL DATA:  Upper extremity penetrating trauma with laceration to radial left forearm. EXAM: CT ANGIOGRAPHY OF THE left upperEXTREMITY TECHNIQUE: Multidetector CT imaging of the right/left upper/lowerwas performed using the standard protocol during bolus administration of intravenous contrast. Multiplanar CT image reconstructions and MIPs were obtained to evaluate the vascular anatomy. RADIATION DOSE REDUCTION: This exam was performed according to the departmental dose-optimization program which includes automated exposure control, adjustment of the mA and/or kV according to patient size and/or use of iterative reconstruction technique. CONTRAST:  75mL OMNIPAQUE IOHEXOL 350 MG/ML SOLN COMPARISON:  None Available. FINDINGS: Vasculature: Axillary artery: Widely patent. No acute luminal abnormality. No aneurysm or ectasia. Brachial artery: Widely patent. No acute luminal abnormality. No aneurysm or ectasia. Radial artery: The radial artery proximal and distal to soft tissue laceration is widely patent. The  radial artery is slightly decreased in caliber in the distal forearm in the area of laceration however there is no aneurysm, pseudoaneurysm, or active extravasation. No occlusion. Ulnar artery: Widely patent. No acute luminal abnormality. No aneurysm or ectasia. Nonvascular: Laceration about the radial aspect of the distal forearm. No contrast extravasation. No acute fracture. Review of the MIP images confirms the above findings. IMPRESSION: Laceration about the radial aspect of the distal forearm. Slightly decreased caliber of the radial artery in the distal forearm in the area of laceration however there is no occlusion, aneurysm, pseudoaneurysm, or active extravasation. Electronically Signed   By: Minerva Fester M.D.   On: 03/16/2023 01:34   DG Forearm Left  Result Date: 03/16/2023 CLINICAL DATA:  laceration EXAM: LEFT FOREARM -  2 VIEW COMPARISON:  None Available. FINDINGS: There is no evidence of fracture or other focal bone lesions. Horizontal soft tissue laceration overlying the left anterior forearm. No retained radiopaque foreign body. IMPRESSION: 1. No acute displaced fracture or dislocation. 2. No retained radiopaque foreign body. Electronically Signed   By: Tish Frederickson M.D.   On: 03/16/2023 00:57   DG Tibia/Fibula Right  Result Date: 03/16/2023 CLINICAL DATA:  laceration.  Suicide attempt EXAM: RIGHT TIBIA AND FIBULA - 2 VIEW COMPARISON:  None Available. FINDINGS: There is no evidence of fracture or other focal bone lesions. Soft tissues are unremarkable. No retained radiopaque foreign body. IMPRESSION: Negative. Electronically Signed   By: Tish Frederickson M.D.   On: 03/16/2023 00:56   DG Tibia/Fibula Left  Result Date: 03/16/2023 CLINICAL DATA:  laceration.  Suicide attempt. EXAM: LEFT TIBIA AND FIBULA - 2 VIEW COMPARISON:  None Available. FINDINGS: There is no evidence of fracture or other focal bone lesions. Soft tissues are unremarkable. No retained radiopaque foreign body.  IMPRESSION: Negative. Electronically Signed   By: Tish Frederickson M.D.   On: 03/16/2023 00:51    Procedures .Marland KitchenLaceration Repair  Date/Time: 03/16/2023 12:30 AM  Performed by: Glynn Octave, MD Authorized by: Glynn Octave, MD   Consent:    Consent obtained:  Emergent situation   Consent given by:  Patient   Risks, benefits, and alternatives were discussed: yes     Risks discussed:  Infection, poor cosmetic result, retained foreign body, vascular damage, tendon damage, pain, poor wound healing, nerve damage and need for additional repair Universal protocol:    Procedure explained and questions answered to patient or proxy's satisfaction: yes     Relevant documents present and verified: yes     Test results available: yes     Patient identity confirmed:  Verbally with patient Anesthesia:    Anesthesia method:  Local infiltration   Local anesthetic:  Lidocaine 2% WITH epi Laceration details:    Location:  Shoulder/arm   Shoulder/arm location:  L lower arm   Length (cm):  4 Pre-procedure details:    Preparation:  Patient was prepped and draped in usual sterile fashion and imaging obtained to evaluate for foreign bodies Exploration:    Hemostasis achieved with:  Epinephrine, tied off vessels, direct pressure and tourniquet   Imaging obtained: x-ray     Imaging outcome: foreign body not noted     Wound exploration: wound explored through full range of motion and entire depth of wound visualized     Wound extent: muscle damage, nerve damage, tendon damage and vascular damage     Wound extent: no underlying fracture     Tendon damage location:  Upper extremity   Upper extremity tendon damage location:  Finger extensor   Tendon repair plan:  Refer for evaluation Treatment:    Area cleansed with:  Povidone-iodine   Amount of cleaning:  Standard   Irrigation solution:  Sterile saline   Irrigation volume:  1000   Irrigation method:  Pressure wash   Visualized foreign  bodies/material removed: no     Layers/structures repaired:  Deep subcutaneous Deep subcutaneous:    Suture size:  3-0   Suture material:  Vicryl   Suture technique:  Figure eight   Number of sutures:  3 Skin repair:    Repair method:  Sutures   Suture size:  4-0   Suture material:  Nylon   Suture technique:  Simple interrupted   Number of sutures:  4 Approximation:  Approximation:  Loose Repair type:    Repair type:  Complex Post-procedure details:    Dressing:  Antibiotic ointment and adhesive bandage   Procedure completion:  Tolerated .Marland KitchenLaceration Repair  Date/Time: 03/16/2023 2:35 AM  Performed by: Glynn Octave, MD Authorized by: Glynn Octave, MD   Consent:    Consent obtained:  Verbal and emergent situation   Consent given by:  Patient   Risks, benefits, and alternatives were discussed: yes     Risks discussed:  Infection, need for additional repair, nerve damage, poor wound healing, poor cosmetic result, pain, retained foreign body, tendon damage and vascular damage   Alternatives discussed:  No treatment Universal protocol:    Procedure explained and questions answered to patient or proxy's satisfaction: yes     Relevant documents present and verified: yes     Test results available: yes     Imaging studies available: yes     Required blood products, implants, devices, and special equipment available: yes     Site/side marked: yes     Immediately prior to procedure, a time out was called: yes     Patient identity confirmed:  Verbally with patient Anesthesia:    Anesthesia method:  Local infiltration   Local anesthetic:  Lidocaine 1% WITH epi Laceration details:    Location:  Leg   Leg location:  L lower leg (bilateral lower legs)   Length (cm):  25 Pre-procedure details:    Preparation:  Patient was prepped and draped in usual sterile fashion Exploration:    Hemostasis achieved with:  Epinephrine   Imaging obtained: x-ray     Imaging outcome:  foreign body not noted     Wound exploration: wound explored through full range of motion and entire depth of wound visualized     Wound extent: no underlying fracture and no vascular damage   Treatment:    Area cleansed with:  Povidone-iodine   Amount of cleaning:  Standard   Irrigation solution:  Sterile saline   Irrigation volume:  1000   Irrigation method:  Pressure wash Skin repair:    Repair method:  Staples   Number of staples:  17 Approximation:    Approximation:  Loose Repair type:    Repair type:  Intermediate Post-procedure details:    Dressing:  Antibiotic ointment and adhesive bandage   Procedure completion:  Tolerated well, no immediate complications Comments:     Multiple superficial lacerations to bilateral lower extremities.  Repaired with 17 staples in total. .Critical Care  Performed by: Glynn Octave, MD Authorized by: Glynn Octave, MD   Critical care provider statement:    Critical care time (minutes):  45   Critical care time was exclusive of:  Separately billable procedures and treating other patients   Critical care was necessary to treat or prevent imminent or life-threatening deterioration of the following conditions:  Trauma   Critical care was time spent personally by me on the following activities:  Development of treatment plan with patient or surrogate, discussions with consultants, evaluation of patient's response to treatment, examination of patient, ordering and review of laboratory studies, ordering and review of radiographic studies, ordering and performing treatments and interventions, pulse oximetry, re-evaluation of patient's condition, review of old charts, blood draw for specimens and obtaining history from patient or surrogate   I assumed direction of critical care for this patient from another provider in my specialty: no     Care discussed with: admitting provider       Medications  Ordered in ED Medications  fentaNYL (SUBLIMAZE)  injection 50 mcg (has no administration in time range)  Tdap (BOOSTRIX) injection 0.5 mL (has no administration in time range)  ceFAZolin (ANCEF) IVPB 1 g/50 mL premix (has no administration in time range)  fentaNYL (SUBLIMAZE) injection 50 mcg (50 mcg Intravenous Given 03/16/23 0007)    ED Course/ Medical Decision Making/ A&P                                 Medical Decision Making Amount and/or Complexity of Data Reviewed Independent Historian: EMS Labs: ordered. Decision-making details documented in ED Course. Radiology: ordered and independent interpretation performed. Decision-making details documented in ED Course. ECG/medicine tests: ordered and independent interpretation performed. Decision-making details documented in ED Course.  Risk Prescription drug management.   Suicide attempt with self-inflicted laceration to left wrist and bilateral shins.  Vital stable on arrival.  Active bleeding from left forearm laceration with tourniquet removed. Does not appear to be pulsatile.  Bleeding controlled on arrival with figure-of-eight sutures. Patient given IV fluids, IV antibiotics and tetanus shot  X-ray negative for fracture or foreign body.  Results reviewed interpreted by me.  CTA negative for active extravasation.  There is some narrowing of the radial artery near site of laceration.  Discussed with Dr. Frazier Butt of hand surgery. Agrees with clean and close laceration.  No intervention needed for radial artery injury as there is no extravasation.  Patient has intact pulse and sensation and cardinal hand movements.  He will evaluate patient while he is holding for psychiatry  No further bleeding of left forearm laceration after figure-of-eight sutures.   Laceration was cleaned and closed as above.  Dr. Frazier Butt updated with patient's weakness on thumb extension.  He will see patient while he is waiting for psychiatry in the morning.  Staples placed to lower extremity  lacerations after wounds cleaned.  Patient is medically clear for TTS evaluation.  Will give course of prophylactic antibiotics.  He is medically clear for TTS evaluation.  Holding orders placed        Final Clinical Impression(s) / ED Diagnoses Final diagnoses:  None    Rx / DC Orders ED Discharge Orders     None         Takesha Steger, Jeannett Senior, MD 03/16/23 [phone]

## 2023-03-16 NOTE — Progress Notes (Signed)
   03/16/23 2100  Psych Admission Type (Psych Patients Only)  Admission Status Involuntary  Psychosocial Assessment  Patient Complaints Anxiety;Depression  Eye Contact Fair  Facial Expression Animated  Affect Depressed  Speech Logical/coherent  Interaction Assertive  Motor Activity Slow  Appearance/Hygiene Unremarkable  Behavior Characteristics Cooperative  Mood Depressed  Aggressive Behavior  Effect No apparent injury  Thought Process  Coherency WDL  Content WDL  Delusions None reported or observed  Perception WDL  Hallucination None reported or observed  Judgment Poor  Confusion WDL  Danger to Self  Current suicidal ideation? Denies  Danger to Others  Danger to Others None reported or observed  Danger to Others Abnormal  Harmful Behavior to others No threats or harm toward other people  Destructive Behavior No threats or harm toward property

## 2023-03-16 NOTE — Progress Notes (Signed)
   03/16/23 1706  Psych Admission Type (Psych Patients Only)  Admission Status Involuntary  Psychosocial Assessment  Patient Complaints Depression;Anxiety  Eye Contact Fair  Facial Expression Animated  Affect Depressed  Speech Logical/coherent  Interaction Assertive  Motor Activity Other (Comment) (unremarkable)  Appearance/Hygiene Unremarkable;In scrubs  Behavior Characteristics Cooperative  Mood Depressed  Thought Process  Coherency WDL  Content WDL  Delusions None reported or observed  Perception WDL  Hallucination None reported or observed  Judgment Poor  Confusion None  Danger to Self  Current suicidal ideation? Denies  Agreement Not to Harm Self Yes  Description of Agreement verbal  Danger to Others  Danger to Others None reported or observed  Danger to Others Abnormal  Harmful Behavior to others No threats or harm toward other people  Destructive Behavior No threats or harm toward property

## 2023-03-16 NOTE — ED Notes (Signed)
Patient showered, clean linen and scrubs provided and dressings were changed.

## 2023-03-16 NOTE — BH Assessment (Signed)
IRIS will complete tele-assessment. Dr. Maryelizabeth Kaufmann will complete assessment at 0930. Secure initiated.

## 2023-03-16 NOTE — Progress Notes (Signed)
EKG completed and copy placed in pt's chart.

## 2023-03-16 NOTE — Plan of Care (Signed)
  Problem: Education: Goal: Mental status will improve Outcome: Progressing Goal: Verbalization of understanding the information provided will improve Outcome: Progressing   Problem: Activity: Goal: Interest or engagement in activities will improve Outcome: Progressing   Problem: Safety: Goal: Periods of time without injury will increase Outcome: Progressing

## 2023-03-16 NOTE — Tx Team (Signed)
Initial Treatment Plan 03/16/2023 5:39 PM [name] ZOX:[id number]    PATIENT STRESSORS: Other: cyber-bulling and confrontation with others      PATIENT STRENGTHS: Ability for insight  General fund of knowledge  Motivation for treatment/growth  Supportive family/friends    PATIENT IDENTIFIED PROBLEMS: MDD                     DISCHARGE CRITERIA:  Improved stabilization in mood, thinking, and/or behavior Motivation to continue treatment in a less acute level of care Need for constant or close observation no longer present Verbal commitment to aftercare and medication compliance  PRELIMINARY DISCHARGE PLAN: Outpatient therapy Return to previous living arrangement  PATIENT/FAMILY INVOLVEMENT: This treatment plan has been presented to and reviewed with the patient, Vincent Frank.  The patient and family have been given the opportunity to ask questions and make suggestions.  Laurance Flatten, RN 03/16/2023, 5:39 PM
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
## 2023-03-16 NOTE — Progress Notes (Signed)
Psychoeducational Group Note  Date:  03/16/2023 Time:  2218  Group Topic/Focus:  Wrap-Up Group:   The focus of this group is to help patients review their daily goal of treatment and discuss progress on daily workbooks.  Participation Level: Did Not Attend  Participation Quality:  Not Applicable  Affect:  Not Applicable  Cognitive:  Not Applicable  Insight:  Not Applicable  Engagement in Group: Not Applicable  Additional Comments:  The patient did not attend group this evening.   Hazle Coca S 03/16/2023, 10:18 PM

## 2023-03-16 NOTE — ED Notes (Signed)
Trauma Response Nurse Documentation   Vincent Frank is a 22 y.o. male arriving to [facility] ED via [facility]  On No antithrombotic. Trauma was activated as a Level 2 by Deeann Cree based on the following trauma criteria Arm Laceration/SI attempt.  Patient cleared for CT by Dr. Manus Gunning. Pt transported to CT with trauma response nurse present to monitor. RN remained with the patient throughout their absence from the department for clinical observation.   GCS 15.  History   Past Medical History:  Diagnosis Date   Major depressive disorder      History reviewed. No pertinent surgical history.     Initial Focused Assessment (If applicable, or please see trauma documentation): Airway-- intact, no visible obstruction Breathing-- spontaneous, unlabored Circulation-- 4 cm laceration to left forearm, hemorrhage controlled via tourniquet placed by EMS.  CT's Completed:   CT Angio Left upper extr  Interventions:  See event summary  Plan for disposition:  Other, Psych hold   Consults completed:  Hand surgery at 0158  Event Summary: Patient brought in by [facility]. Patient with SI attempt, with 4 cm laceration to left forearm, bleeding controlled via tourniquet placed by EMS. On arrival tourniquet taken down by EDP, active bleeding noted. 50 mcg fentanyl adminstered. EDP placed several sutures, bleeding controlled. Pressure dressing placed with combat gauze. Xray tib/fib right and left, left forearm completed. Patient to CT with TRN and primary RN. CT angio up extremity completed. Patient back to trauma bay. Tdap administered, 1 g ancef administered.  MTP Summary (If applicable):  N/A  Bedside handoff with ED RN Shawna Orleans.    Leota Sauers  Trauma Response RN  Please call TRN at [phone] for further assistance.
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
## 2023-03-16 NOTE — Consult Note (Signed)
Telepsych Consultation   Reason for Consult:  "Suicide Attempt"  Referring Physician:  Dr. Glynn Octave Location of Patient:     [facility] Location of Provider: Other: Virtual home office  Patient Identification: Vincent Vincent Frank MRN:  [id number] Principal Diagnosis: Vincent [name], recurrent severe without psychotic features (HCC) Diagnosis:  Principal Problem:   Vincent [name], recurrent severe without psychotic features (HCC) Active Problems:   History of factitious disorder   Suicide attempt by cutting of wrist (HCC)   Total Time spent with patient: 30 minutes  Subjective:   Vincent Vincent Frank is a 22 y.o. male patient admitted with  Per RN Triage Nurse 03/16/2023: "PER EMS: pt brought in for suicide attempt by cutting himself with serrated knife. Self inflected deep horizontal laceration to left wrist and superficial lacerations to bilateral shins. +PMS in legs, but unable to move his fingers on left hand. EMS applied tourniquet to left upper arm to control bleeding at 2315.   BP- 140/112, HR-82, O2-98%, CBG-104"  HPI:   Patient seen via telepsych by this provider; chart reviewed and consulted with Dr. Lucianne Muss on 03/16/23.  On evaluation Vincent Vincent Frank is seen sitting up in bed, neatly groomed and appropriately dressed I'm hospital scrubs.  Pt greeted by this Clinical research associate and given anticipatory guidance.  His mother is at the bedside, per his request, she leaves the room so his assessment can begin. Patient is alert and oriented x4; when asked how he's doing today he states,  his ankles are fine but his left arm is painful.  His left forearm is wrapped with gauze, his wound is not visualized.  No prn pain medications ordered.   Regarding events that led to his current admission.  Pt reports a hx of being bullied regarding "mental health/behaviors issues" when he was in high school.  Pt reports he graduated last year.  While on snap chat, yesterday,  he was triggered when some high school friends began asking him about previous behaviors.  Pt states he was embarrassed and upset, and decided to get a knife to cut his wrist to end his life.    Pt reports a hx for MDD and Anxiety  and is currently managed with [facility] outpatient psychiatry.  Patient states he takes  fluoxetine 40mg  po daily, for a few years.  He reports fluoxetine helps his depression.  Abilify 10mg  po daily was added after he was d/c from [facility] earlier this month.  He states he's been on abiify for 2-3 weeks, takes daily but does not like the side effects, "tics and my muscles tense up." He has not discussed these concerns with is outpatient provider yet.  Pt repots his appetite is poor and his sleep is fair.    Labs: CMP within normal limits to continue psych meds; CBC -no leukocytosis or anemia No current UDS; previous UDS completed 02/28/2023 was negative.  Pt denies alcohol or drug usage.  EKG completed 02/28/2023 398/438 NSR; he will need an updated EKG to rule out prolonged QT/QTC intervals  Per ortho consult completed 03/16/2023, pt has numbness and weakness to his left thumb; Left hand nerve injury -- Plan on elective exploration and repair by Dr. Frazier Butt. F/u in office to discuss surgery as soon as released from [facility].   During evaluation Vincent Vincent Frank is sitting upright in bed;He is neatly groomed, and appropriately dressed in hospital scrubs.  Gauze bandage to left forearm, appears clean dry and intact; tattoo visualized to left forearm  just above gauze. He is alert/oriented x 4; depressed, hopeless but cooperative; and mood congruent with affect.  Patient is speaking in a clear tone at moderate volume, and normal pace; with good eye contact.  His thought process is goal oriented and linear; There is no indication that he is currently responding to internal/external stimuli or experiencing delusional thought content.  Patient endorses suicide attempt via cutting his wrist  with a knife and also cutting his bilateral shins.  He denies homicidal ideation, psychosis, and paranoia.  Patient has remained cooperative throughout assessment and has answered questions appropriately.    Per ED Provider Admission Note 03/15/2023@2354 : Chief Complaint  Patient presents with   Suicide Attempt      Vincent Vincent Frank is a 22 y.o. male.   Patient presents via EMS after suicide attempt.  He slit his left wrist and cut his bilateral shins with a kitchen knife that was serrated just prior to evaluation.  He called EMS himself.  Admits this was self-harm.  Tourniquet was placed to left arm by fire prior to EMS arrival.  There was active bleeding from left forearm laceration.  He is unable to move his left hand.  Complains of pain to his left arm.  Vitals are stable for EMS.  No blood thinner use.  Denies any alcohol or drug use.   The history is provided by the patient and the EMS personnel.    Past Psychiatric History: Pt has hx of depression and anxiety, was recently discharged from [facility]/2023 for depressive concerns and referred for continued outpatient care and med mgmt.   Risk to Self:  yes Risk to Others:  no Prior Inpatient Therapy:  yes as outlined above Prior Outpatient Therapy:  pt states he's enrolled at [facility] for psychiatric medication management and   Past Medical History:  Past Medical History:  Diagnosis Date   Vincent [name]    History reviewed. No pertinent surgical history. Family History: History reviewed. No pertinent family history. Family Psychiatric  History: deferred Social History:  Social History   Substance and Sexual Activity  Alcohol Use Never     Social History   Substance and Sexual Activity  Drug Use Never    Social History   Socioeconomic History   Marital status: Single    Spouse name: Not on file   Number of children: Not on file   Years of education: Not on file   Highest education level: Not on file   Occupational History   Not on file  Tobacco Use   Smoking status: Never   Smokeless tobacco: Never  Vaping Use   Vaping status: Never Used  Substance and Sexual Activity   Alcohol use: Never   Drug use: Never   Sexual activity: Not on file  Other Topics Concern   Not on file  Social History Narrative   Not on file   Social Determinants of Health   Financial Resource Strain: Not on file  Food Insecurity: No Food Insecurity (02/28/2023)   Hunger Vital Sign    Worried About Running Out of Food in the Last Year: Never true    Ran Out of Food in the Last Year: Never true  Transportation Needs: No Transportation Needs (02/28/2023)   PRAPARE - Administrator, Civil Service (Medical): No    Lack of Transportation (Non-Medical): No  Physical Activity: Not on file  Stress: Not on file  Social Connections: Unknown (01/16/2023)   Received from [facility]  Social Network    Social Network: Not on file   Additional Social History:    Allergies:   Allergies  Allergen Reactions   Bee Venom Anaphylaxis   Hornet Venom Anaphylaxis and Itching   Haldol [Haloperidol] Other (See Comments)    Per father dystonic reaction with  muscle contractures.    Labs:  Results for orders placed or performed during the hospital encounter of 03/15/23 (from the past 48 hour(s))  CBC with Differential     Status: None   Collection Time: 03/15/23 11:58 PM  Result Value Ref Range   WBC 9.2 4.0 - 10.5 K/uL   RBC 5.07 4.22 - 5.81 MIL/uL   Hemoglobin 14.1 13.0 - 17.0 g/dL   HCT 24.4 01.0 - 27.2 %   MCV 80.1 80.0 - 100.0 fL   MCH 27.8 26.0 - 34.0 pg   MCHC 34.7 30.0 - 36.0 g/dL   RDW 53.6 64.4 - 03.4 %   Platelets 237 150 - 400 K/uL   nRBC 0.0 0.0 - 0.2 %   Neutrophils Relative % 59 %   Neutro Abs 5.4 1.7 - 7.7 K/uL   Lymphocytes Relative 27 %   Lymphs Abs 2.5 0.7 - 4.0 K/uL   Monocytes Relative 8 %   Monocytes Absolute 0.8 0.1 - 1.0 K/uL   Eosinophils Relative 5 %   Eosinophils  Absolute 0.5 0.0 - 0.5 K/uL   Basophils Relative 1 %   Basophils Absolute 0.1 0.0 - 0.1 K/uL   Immature Granulocytes 0 %   Abs Immature Granulocytes 0.03 0.00 - 0.07 K/uL    Comment: Performed at [facility], [address], [place], [place] [address]  Comprehensive metabolic panel     Status: Abnormal   Collection Time: 03/15/23 11:58 PM  Result Value Ref Range   Sodium 139 135 - 145 mmol/L   Potassium 3.5 3.5 - 5.1 mmol/L   Chloride 104 98 - 111 mmol/L   CO2 19 (L) 22 - 32 mmol/L   Glucose, Bld 107 (H) 70 - 99 mg/dL    Comment: Glucose reference range applies only to samples taken after fasting for at least 8 hours.   BUN 12 6 - 20 mg/dL   Creatinine, Ser 5.63 0.61 - 1.24 mg/dL   Calcium 87.5 8.9 - 64.3 mg/dL   Total Protein 6.7 6.5 - 8.1 g/dL   Albumin 4.1 3.5 - 5.0 g/dL   AST 28 15 - 41 U/L   ALT 23 0 - 44 U/L   Alkaline Phosphatase 72 38 - 126 U/L   Total Bilirubin 0.3 0.3 - 1.2 mg/dL   GFR, Estimated >32 >95 mL/min    Comment: (NOTE) Calculated using the CKD-EPI Creatinine Equation (2021)    Anion gap 16 (H) 5 - 15    Comment: Performed at [facility], [address] [place], [place] [address]  Protime-INR     Status: None   Collection Time: 03/15/23 11:58 PM  Result Value Ref Range   Prothrombin Time 13.1 11.4 - 15.2 seconds   INR 1.0 0.8 - 1.2    Comment: (NOTE) INR goal varies based on device and disease states. Performed at [facility], [address] [place], [place] [address]   ABO/Rh     Status: None   Collection Time: 03/15/23 11:58 PM  Result Value Ref Range   ABO/RH(D)      A POS Performed at [facility], [address] [place],  [place] [address]   I-stat chem 8, ed     Status: Abnormal   Collection Time: 03/16/23 12:01 AM  Result Value Ref Range   Sodium 140 135 - 145 mmol/L   Potassium 3.5 3.5 - 5.1 mmol/L   Chloride 107 98 - 111 mmol/L   BUN 13 6 - 20 mg/dL   Creatinine, Ser 1.30 0.61 - 1.24 mg/dL   Glucose, Bld  865 (H) 70 - 99 mg/dL    Comment: Glucose reference range applies only to samples taken after fasting for at least 8 hours.   Calcium, Ion 1.13 (L) 1.15 - 1.40 mmol/L   TCO2 21 (L) 22 - 32 mmol/L   Hemoglobin 13.3 13.0 - 17.0 g/dL   HCT 78.4 69.6 - 29.5 %  Type and screen [facility]     Status: None   Collection Time: 03/16/23 12:51 AM  Result Value Ref Range   ABO/RH(D) A POS    Antibody Screen NEG    Sample Expiration      03/19/2023,2359 Performed at [facility], [address], [place], [place] [address]     Medications:  Current Facility-Administered Medications  Medication Dose Route Frequency Provider Last Rate Last Admin   [START ON 03/17/2023] ARIPiprazole (ABILIFY) tablet 5 mg  5 mg Oral Daily Ophelia Shoulder E, NP       cephALEXin (KEFLEX) capsule 500 mg  500 mg Oral Q8H Rancour, Stephen, MD   500 mg at 03/16/23 2440   FLUoxetine (PROZAC) capsule 40 mg  40 mg Oral Daily Rancour, Stephen, MD   40 mg at 03/16/23 1003   Current Outpatient Medications  Medication Sig Dispense Refill   ARIPiprazole (ABILIFY) 5 MG tablet Take 1 tablet (5 mg total) by mouth daily. 30 tablet 0   EPINEPHrine 0.3 mg/0.3 mL IJ SOAJ injection Inject 0.3 mg into the muscle as needed for anaphylaxis. 2 each 0   FLUoxetine (PROZAC) 40 MG capsule Take 1 capsule (40 mg total) by mouth daily. 30 capsule 0    Musculoskeletal: Per Orthopedics consult 03/16/2023@1220  pm: Comments: Left shoulder, elbow, wrist, digits- Sutured laceration radial FA, no instability, no blocks to motion             Sens  Ax/U intact, R/M paresthetic             Mot   Ax/ PIN/ U intact, ?AIN weakness, R/M 2/5             Rad 2+  Left hand nerve injury -- Plan on elective exploration and repair by Dr. Frazier Butt. F/u in office to discuss surgery as soon as released from [facility].   Psychiatric Specialty Exam:  Presentation  General Appearance:  Appropriate for Environment; Neat  Eye  Contact: Good  Speech: Clear and Coherent  Speech Volume: Normal  Handedness: Right   Mood and Affect  Mood: Depressed; Hopeless  Affect: Congruent; Constricted   Thought Process  Thought Processes: Goal Directed; Linear  Descriptions of Associations:Intact  Orientation:Full (Time, Place and Person)  Thought Content:Illogical (in that he cut his wrist to end his life.)  History of Schizophrenia/Schizoaffective disorder:No data recorded Duration of Psychotic Symptoms:No data recorded Hallucinations:Hallucinations: None  Ideas of Reference:None  Suicidal Thoughts:Suicidal Thoughts: Yes, Active SI Active Intent and/or Plan: With Intent; With Plan; With Means to Carry Out; With Access to Means  Homicidal Thoughts:Homicidal Thoughts: No   Sensorium  Memory: Immediate Good; Remote Good; Recent Good  Judgment: Poor  Insight: Poor  Executive Functions  Concentration: Fair  Attention Span: Fair  Recall: Vincent Vincent Frank of Knowledge: Good  Language: Good   Psychomotor Activity  Psychomotor Activity:Psychomotor Activity: Normal   Assets  Assets: Communication Skills; Housing; Social Support   Sleep  Sleep:Sleep: Good Number of Hours of Sleep: 7    Physical Exam: Physical Exam Vitals and nursing note reviewed.  Constitutional:      Appearance: Normal appearance.    Cardiovascular:     Rate and Rhythm: Normal rate.     Pulses: Normal pulses.  Pulmonary:     Effort: Pulmonary effort is normal.  Musculoskeletal:        General: Normal range of motion.     Cervical back: Normal range of motion.  Neurological:     Mental Status: He is alert and oriented to person, place, and time. Mental status is at baseline.  Psychiatric:        Attention and Perception: Attention normal.        Mood and Affect: Mood is depressed. Affect is blunt.        Speech: Speech normal.        Behavior: Behavior is withdrawn. Behavior is cooperative.         Cognition and Memory: Cognition normal.        Judgment: Judgment is impulsive.    Review of Systems  Constitutional: Negative.   HENT: Negative.    Eyes: Negative.   Respiratory: Negative.    Cardiovascular: Negative.   Gastrointestinal: Negative.   Genitourinary: Negative.   Skin: Negative.   Neurological: Negative.   Endo/Heme/Allergies: Negative.   Psychiatric/Behavioral:  Positive for depression and suicidal ideas. Negative for hallucinations, memory loss and substance abuse. The patient does not have insomnia.    Blood pressure 137/82, pulse 85, temperature 98.3 F (36.8 C), temperature source Oral, resp. rate 18, height 6\' 3"  (1.905 m), weight 102.1 kg, SpO2 100%. Body mass index is 28.12 kg/m.  Treatment Plan Summary: Patient presents via EMS for suicide attempt via using a serrated knife to cut his wrists and bilateral shins; pt called EMS himself and admits.  On assessment today, pt is alert and oriented x4; reports he was triggered by his personal information being shared on social media.  Patient has a hx for depression,anxiety and was recently discharged from [facility] 8[facility]8 for depressive disorder.  No hx for nicotine use, marijuana, alcohol or illicit drug usage. Pt currently takes fluoxetine 40mg  po daily and abilify 5mg  po daily but struggles with employing effective coping skills when feeling overwhelmed. He cannot contract for safety and will require psychiatric admissions where her can received medication adjustments while being monitored for mood stability and safety. Above was discussed with patient who verbalized his understanding and agreement to plan of care.   Psychiatric Dx: Primary:Vincent [name], recurrent, severe without psychosis Secondary: Suicide Attempt by cutting of wrist  -He will need an updated EKG to rule out prolonged QTC intervals. Pending results plan to start psychotropic medications.   Daily contact with patient to assess and evaluate  symptoms and progress in treatment and Medication management   Current medications: Fluoxetine 40mg  po daily for depression/anxiety Aripiprazole 10mg  po daily as adjunct for mood   Recommended Changes: -Increase prozac to 60mg  po daily for mood -Reduce aripiprazole to 5mg  po daily x 2 days; then 2.5mg  x one day then stop. Plan to discontinue d/t intolerable side effects. Aripiprazole has a long half life so should tolerate d/c in shorter time  frame.     Per Orthopedics consult 03/16/2023@1220  pm: Comments: Left shoulder, elbow, wrist, digits- Sutured laceration radial FA, no instability, no blocks to motion             Sens  Ax/U intact, R/M paresthetic             Mot   Ax/ PIN/ U intact, ?AIN weakness, R/M 2/5             Rad 2+  Left hand nerve injury -- Plan on elective exploration and repair by Dr. Frazier Butt. F/u in office to discuss surgery as soon as released from [facility].   -Per nursing, patient wounds were oozing earlier today but currently wrapped.  -Have asked Dr. Rhunette Croft for assistance in prn medications.  Disposition:Patient is medically cleared.  He has been accepted to [facility] for psychiatric admission for today 8/20/204  Spoke with Dr. Derwood Kaplan, ED Provider; Delorise Royals, RN; Cathie Beams, LCSW dispositions; Orlean Patten, [facility], were all informed of above recommendation and disposition via secure chat.  This service was provided via telemedicine using a 2-way, interactive audio and video technology.  Names of all persons participating in this telemedicine service and their role in this encounter. Name: Hikeem Sterman Role: Patient  Name: Ophelia Shoulder Role: PMHNP  Name: Nelly Rout Role: Psychiatrist   Chales Abrahams, NP 03/16/2023 11:02 AM

## 2023-03-16 NOTE — ED Notes (Signed)
Patient belongings placed in [place] [facility] at this time.

## 2023-03-16 NOTE — ED Provider Notes (Signed)
Emergency Medicine Observation Re-evaluation Note  Vincent Frank is a 22 y.o. male, seen on rounds today.  Pt initially presented to the ED for complaints of Suicide Attempt Currently, the patient is resting - but c/o L thumb numbness and difficulty in moving it.  Physical Exam  BP 137/82 (BP Location: Right Arm)   Pulse 85   Temp 98.3 F (36.8 C) (Oral)   Resp 18   Ht 6\' 3"  (1.905 m)   Wt 102.1 kg   SpO2 100%   BMI 28.12 kg/m  Physical Exam General: no distresss Cardiac: regular rate Lungs: no resp distress Psych: calm Hand: Patient has subjective numbness over the L thumb dorsally except for the tip. Also has difficulty with opposition, flexion and giving a thumbs up sign  ED Course / MDM  EKG:   I have reviewed the labs performed to date as well as medications administered while in observation.  Recent changes in the last 24 hours include - seen for SI, multiple cuts - s/p repair.  Plan  Current plan is for consultation to hand surgery. ? Neuropraxia/nerve injury vs. Partial tendon tear.    Derwood Kaplan, MD 03/16/23 1118

## 2023-03-16 NOTE — ED Notes (Signed)
IVC paperwork complete and in orange zone, original in red folder, expires 03/23/23, case # [id number]

## 2023-03-16 NOTE — Progress Notes (Signed)
Pt has been accepted to [facility] TODAY 03/16/2023, pending EKG. Bed assignment: 406-1  Pt meets inpatient criteria per Ophelia Shoulder, NP  Attending Physician will be Nadir Abbott Pao, MD  Report can be called to: - Adult unit: [phone]  Pt can arrive after pending discharges  Care Team Notified: [facility] Malva Limes, RN, Lorina Rabon, NT, Ophelia Shoulder, NP, Valentina Shaggy, NT, and Roseanne Reno, RN  [place], [place]  03/16/2023 9:57 AM

## 2023-03-17 ENCOUNTER — Encounter (HOSPITAL_COMMUNITY): Payer: Self-pay

## 2023-03-17 DIAGNOSIS — F3130 Bipolar disorder, current episode depressed, mild or moderate severity, unspecified: Secondary | ICD-10-CM | POA: Insufficient documentation

## 2023-03-17 DIAGNOSIS — F332 Major depressive disorder, recurrent severe without psychotic features: Secondary | ICD-10-CM | POA: Diagnosis not present

## 2023-03-17 MED ORDER — FLUOXETINE HCL 20 MG PO CAPS
40.0000 mg | ORAL_CAPSULE | Freq: Every day | ORAL | Status: DC
  Administered 2023-03-18 – 2023-03-19 (×2): 40 mg via ORAL
  Filled 2023-03-17 (×4): qty 2

## 2023-03-17 MED ORDER — BACITRACIN-NEOMYCIN-POLYMYXIN OINTMENT TUBE
TOPICAL_OINTMENT | Freq: Two times a day (BID) | CUTANEOUS | Status: DC
  Administered 2023-03-17 – 2023-03-20 (×2): 1 via TOPICAL
  Filled 2023-03-17 (×2): qty 14.17

## 2023-03-17 MED ORDER — ARIPIPRAZOLE 2 MG PO TABS
2.0000 mg | ORAL_TABLET | Freq: Every day | ORAL | Status: DC
  Administered 2023-03-17 – 2023-03-19 (×3): 2 mg via ORAL
  Filled 2023-03-17 (×6): qty 1

## 2023-03-17 MED ORDER — HYDROXYZINE HCL 25 MG PO TABS
25.0000 mg | ORAL_TABLET | Freq: Four times a day (QID) | ORAL | Status: DC | PRN
  Administered 2023-03-19 – 2023-03-20 (×2): 25 mg via ORAL
  Filled 2023-03-17 (×2): qty 1

## 2023-03-17 NOTE — [facility] Suicide Risk Assessment (Signed)
Suicide Risk Assessment  Admission Assessment    [facility] Admission Suicide Risk Assessment   Nursing information obtained from:  Patient  Demographic factors:  Male, Caucasian, Unemployed  Current Mental Status:  NA  Loss Factors:  NA  Historical Factors:  Prior suicide attempts  Risk Reduction Factors:  Living with another person, especially a relative, Positive social support  Total Time spent with patient: 1 hour  Principal Problem: MDD (major depressive disorder), recurrent episode, severe (HCC) Diagnosis:  Principal Problem:   MDD (major depressive disorder), recurrent episode, severe (HCC)  Subjective Data: See H&P.  Continued Clinical Symptoms:  Alcohol Use Disorder Identification Test Final Score (AUDIT): 0 The "Alcohol Use Disorders Identification Test", Guidelines for Use in Primary Care, Second Edition.  World [employer] writer [facility]). Score between 0-7:  no or low risk or alcohol related problems. Score between 8-15:  moderate risk of alcohol related problems. Score between 16-19:  high risk of alcohol related problems. Score 20 or above:  warrants further diagnostic evaluation for alcohol dependence and treatment.  CLINICAL FACTORS:   Depression:   Hopelessness Impulsivity Unstable or Poor Therapeutic Relationship Previous Psychiatric Diagnoses and Treatments  Musculoskeletal: Strength & Muscle Tone: within normal limits Gait & Station: normal Patient leans: N/A  Psychiatric Specialty Exam:  Presentation  General Appearance:  Appropriate for Environment; Casual (Presens with several self-inflicted lacerations to fore-arm & lower leg areas. The lcerations to le areas with staples.)  Eye Contact: Good  Speech: Clear and Coherent; Normal Rate  Speech Volume: Normal  Handedness: Right   Mood and Affect  Mood: Depressed  Affect: Congruent; Depressed   Thought Process  Thought Processes: Coherent; Goal Directed; Linear  Descriptions of  Associations:Intact  Orientation:Full (Time, Place and Person)  Thought Content:Logical  History of Schizophrenia/Schizoaffective disorder:No data recorded Duration of Psychotic Symptoms:No data recorded Hallucinations:Hallucinations: None  Ideas of Reference:None  Suicidal Thoughts:Suicidal Thoughts: No SI Active Intent and/or Plan: Without Intent; Without Plan; Without Means to Carry Out; Without Access to Means  Homicidal Thoughts:Homicidal Thoughts: No   Sensorium  Memory: Immediate Good; Recent Good; Remote Good  Judgment: Fair  Insight: Fair   Art therapist  Concentration: Good  Attention Span: Good  Recall: Good  Fund of Knowledge: Fair  Language: Good  Psychomotor Activity  Psychomotor Activity: Psychomotor Activity: Normal  Assets  Assets: Communication Skills; Desire for Improvement; Financial Resources/Insurance; Housing; Physical Health; Resilience; Social Support; Talents/Skills  Sleep  Sleep: Sleep: Good Number of Hours of Sleep: 7.5  Physical Exam: See H&P. Blood pressure 106/67, pulse (!) 105, temperature (!) 97.4 F (36.3 C), temperature source Oral, resp. rate 13, height 6\' 3"  (1.905 m), weight 105.6 kg, SpO2 99%. Body mass index is 29.1 kg/m.  COGNITIVE FEATURES THAT CONTRIBUTE TO RISK:  Closed-mindedness, Polarized thinking, and Thought constriction (tunnel vision)    SUICIDE RISK:   Severe:  Frequent, intense, and enduring suicidal ideation, specific plan, no subjective intent, but some objective markers of intent (i.e., choice of lethal method), the method is accessible, some limited preparatory behavior, evidence of impaired self-control, severe dysphoria/symptomatology, multiple risk factors present, and few if any protective factors, particularly a lack of social support.  PLAN OF CARE: See H&P.  I certify that inpatient services furnished can reasonably be expected to improve the patient's condition.   Armandina Stammer, NP, pmhnp, fnp-bc. 03/17/2023, 11:51 AM
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
## 2023-03-17 NOTE — Group Note (Signed)
Date:  03/17/2023 Time:  10:58 AM  Group Topic/Focus:  Goals Group:   The focus of this group is to help patients establish daily goals to achieve during treatment and discuss how the patient can incorporate goal setting into their daily lives to aide in recovery.    Participation Level:  Active  Participation Quality:  Attentive  Affect:  Appropriate  Cognitive:  Appropriate  Insight: Appropriate  Engagement in Group:  Engaged  Modes of Intervention:  Discussion  Additional Comments:     Reymundo Poll 03/17/2023, 10:58 AM
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
## 2023-03-17 NOTE — Group Note (Signed)
Recreation Therapy Group Note   Group Topic:Problem Solving  Group Date: 03/17/2023 Start Time: 0935 End Time: 1005 Facilitators: Timothy Townsel-McCall, LRT,CTRS Location: [address] Dayroom   Goal Area(s) Addresses:  Patient will effectively work with peer towards shared goal.  Patient will identify skills used to make activity successful.  Patient will share challenges and verbalize solution-driven approaches used. Patient will identify how skills used during activity can be used to reach post d/c goals.   Group Description: [employer]. Patients were provided the following materials: 4 drinking straws, 5 rubber bands, 5 paper clips, 2 index cards, 2 drinking cups, and 2 toilet paper rolls. Using the provided materials patients were asked to build a launching mechanism to launch a ping pong ball across the room, approximately 10 feet. Patients were divided into teams of 3-5. Instructions required all materials be incorporated into the device, functionality of items left to the peer group's discretion.   Affect/Mood: N/A   Participation Level: Did not attend    Clinical Observations/Individualized Feedback:     Plan: Continue to engage patient in RT group sessions 2-3x/week.   Laurice Iglesia-McCall, LRT,CTRS  03/17/2023 12:37 PM

## 2023-03-17 NOTE — [facility] Group Notes (Signed)
Spirituality group facilitated by Kathleen Argue, [facility].  Group Description: Group focused on topic of hope. Patients participated in facilitated discussion around topic, connecting with one another around experiences and definitions for hope. Group members engaged with visual explorer photos, reflecting on what hope looks like for them today. Group engaged in discussion around how their definitions of hope are present today in hospital.  Modalities: Psycho-social ed, Adlerian, Narrative, MI  Patient Progress: Vincent Frank attended group and actively engaged and participated in group conversation and activities.  His comments demonstrated good insight.  For him, swimming gives him hope and helps him feel renewed.

## 2023-03-17 NOTE — [facility] Suicide Risk Assessment (Signed)
[facility] INPATIENT:  Family/Significant Other Suicide Prevention Education  Suicide Prevention Education:  Education Completed; Leshun Stenglein, mother [phone] (name of family member/significant other) has been identified by the patient as the family member/significant other with whom the patient will be residing, and identified as the person(s) who will aid the patient in the event of a mental health crisis (suicidal ideations/suicide attempt).  With written consent from the patient, the family member/significant other has been provided the following suicide prevention education, prior to the and/or following the discharge of the patient. CSW spoke with pt's who reported no safety concerns with pt returning home. Clydie Braun reported that they missed a Tackle box which had the knife. We will remove Tackle box from his belongings. No firearms in the home. Pt safe to return home with mother.   The suicide prevention education provided includes the following: Suicide risk factors Suicide prevention and interventions National Suicide Hotline telephone number [facility] assessment telephone number [facility] Emergency Assistance 911 [facility] and/or Residential Mobile Crisis Unit telephone number  Request made of family/significant other to: Remove weapons (e.g., guns, rifles, knives), all items previously/currently identified as safety concern.   Remove drugs/medications (over-the-counter, prescriptions, illicit drugs), all items previously/currently identified as a safety concern.  The family member/significant other verbalizes understanding of the suicide prevention education information provided.  The family member/significant other agrees to remove the items of safety concern listed above.  Baron Parmelee R 03/17/2023, 1:26 PM

## 2023-03-17 NOTE — Progress Notes (Signed)
Patient is alert, oriented, pleasant, and cooperative. Denies SI, HI, AVH, and verbally contracts for safety. Patient reports he slept good last night. Patient reports his appetite as good, energy level as high, and concentration as good. Patient rates his depression 3/10, hopelessness 0/10, and anxiety 2/10. Patient reports pain and bleeding of laceration on left forearm.   Scheduled medications administered per MD order. PRN percocet administered at 1517 for pain. Laceration dressed loosely with gauze. Support provided. Patient educated on safety on the unit and medications. Routine safety checks every 15 minutes. Patient stated understanding to tell nurse about any new physical symptoms. Patient understands to tell staff of any needs.     No adverse drug reactions noted. Patient reported pain medication didn't help much. Patient verbally contracts for safety. Patient remains safe at this time and will continue to monitor.    03/17/23 1100  Psych Admission Type (Psych Patients Only)  Admission Status Involuntary  Psychosocial Assessment  Patient Complaints Anxiety;Depression;Other (Comment) (lightheadedness)  Eye Contact Fair  Facial Expression Animated  Affect Depressed  Speech Logical/coherent  Interaction Assertive  Motor Activity Slow  Appearance/Hygiene Unremarkable  Behavior Characteristics Cooperative  Mood Depressed  Thought Process  Coherency WDL  Content WDL  Delusions None reported or observed  Perception WDL  Hallucination None reported or observed  Judgment Poor  Confusion WDL  Danger to Self  Current suicidal ideation? Denies  Agreement Not to Harm Self Yes  Description of Agreement verbal  Danger to Others  Danger to Others None reported or observed  Danger to Others Abnormal  Harmful Behavior to others No threats or harm toward other people  Destructive Behavior No threats or harm toward property

## 2023-03-17 NOTE — BH IP Treatment Plan (Signed)
Interdisciplinary Treatment and Diagnostic Plan Update  03/17/2023 Time of Session: 11:20am Vincent Frank MRN: [id number]  Principal Diagnosis: MDD (major depressive disorder), recurrent episode, severe (HCC)  Secondary Diagnoses: Principal Problem:   MDD (major depressive disorder), recurrent episode, severe (HCC)   Current Medications:  Current Facility-Administered Medications  Medication Dose Route Frequency Provider Last Rate Last Admin   acetaminophen (TYLENOL) tablet 650 mg  650 mg Oral Q6H PRN Abbott Pao, Nadir, MD       acetaminophen (TYLENOL) tablet 650 mg  650 mg Oral Q6H PRN Abbott Pao, Nadir, MD       alum & mag hydroxide-simeth (MAALOX/MYLANTA) 200-200-20 MG/5ML suspension 30 mL  30 mL Oral Q4H PRN Abbott Pao, Nadir, MD       cephALEXin (KEFLEX) capsule 500 mg  500 mg Oral Q8H Attiah, Nadir, MD   500 mg at 03/17/23 8295   diphenhydrAMINE (BENADRYL) capsule 50 mg  50 mg Oral TID PRN Sarita Bottom, MD       Or   diphenhydrAMINE (BENADRYL) injection 50 mg  50 mg Intramuscular TID PRN Abbott Pao, Nadir, MD       FLUoxetine (PROZAC) capsule 40 mg  40 mg Oral Daily Abbott Pao, Nadir, MD   40 mg at 03/17/23 0825   hydrOXYzine (ATARAX) tablet 25 mg  25 mg Oral Q6H PRN Nwoko, Nicole Kindred I, NP       LORazepam (ATIVAN) tablet 2 mg  2 mg Oral TID PRN Sarita Bottom, MD       Or   LORazepam (ATIVAN) injection 2 mg  2 mg Intramuscular TID PRN Abbott Pao, Nadir, MD       magnesium hydroxide (MILK OF MAGNESIA) suspension 30 mL  30 mL Oral Daily PRN Abbott Pao, Nadir, MD       neomycin-bacitracin-polymyxin (NEOSPORIN) ointment   Topical BID Armandina Stammer I, NP   1 Application at 03/17/23 1154   oxyCODONE-acetaminophen (PERCOCET/ROXICET) 5-325 MG per tablet 1 tablet  1 tablet Oral Q8H PRN Sarita Bottom, MD   1 tablet at 03/17/23 6213   ziprasidone (GEODON) injection 20 mg  20 mg Intramuscular BID PRN Sarita Bottom, MD       PTA Medications: Medications Prior to Admission  Medication Sig Dispense Refill Last Dose    ARIPiprazole (ABILIFY) 5 MG tablet Take 1 tablet (5 mg total) by mouth daily. 30 tablet 0    EPINEPHrine 0.3 mg/0.3 mL IJ SOAJ injection Inject 0.3 mg into the muscle as needed for anaphylaxis. 2 each 0    FLUoxetine (PROZAC) 40 MG capsule Take 1 capsule (40 mg total) by mouth daily. 30 capsule 0     Patient Stressors: Other: cyber-bulling and confrontation with others     Patient Strengths: Ability for insight  General fund of knowledge  Motivation for treatment/growth  Supportive family/friends   Treatment Modalities: Medication Management, Group therapy, Case management,  1 to 1 session with clinician, Psychoeducation, Recreational therapy.   Physician Treatment Plan for Primary Diagnosis: MDD (major depressive disorder), recurrent episode, severe (HCC) Long Term Goal(s): Improvement in symptoms so as ready for discharge   Short Term Goals: Ability to identify and develop effective coping behaviors will improve Ability to maintain clinical measurements within normal limits will improve Compliance with prescribed medications will improve Ability to identify changes in lifestyle to reduce recurrence of condition will improve Ability to verbalize feelings will improve Ability to disclose and discuss suicidal ideas Ability to demonstrate self-control will improve  Medication Management: Evaluate patient's response, side effects, and tolerance of medication regimen.  Therapeutic Interventions: 1 to 1 sessions, Unit Group sessions and Medication administration.  Evaluation of Outcomes: Progressing  Physician Treatment Plan for Secondary Diagnosis: Principal Problem:   MDD (major depressive disorder), recurrent episode, severe (HCC)  Long Term Goal(s): Improvement in symptoms so as ready for discharge   Short Term Goals: Ability to identify and develop effective coping behaviors will improve Ability to maintain clinical measurements within normal limits will improve Compliance  with prescribed medications will improve Ability to identify changes in lifestyle to reduce recurrence of condition will improve Ability to verbalize feelings will improve Ability to disclose and discuss suicidal ideas Ability to demonstrate self-control will improve     Medication Management: Evaluate patient's response, side effects, and tolerance of medication regimen.  Therapeutic Interventions: 1 to 1 sessions, Unit Group sessions and Medication administration.  Evaluation of Outcomes: Progressing   RN Treatment Plan for Primary Diagnosis: MDD (major depressive disorder), recurrent episode, severe (HCC) Long Term Goal(s): Knowledge of disease and therapeutic regimen to maintain health will improve  Short Term Goals: Ability to remain free from injury will improve, Ability to verbalize frustration and anger appropriately will improve, Ability to participate in decision making will improve, Ability to verbalize feelings will improve, Ability to identify and develop effective coping behaviors will improve, and Compliance with prescribed medications will improve  Medication Management: RN will administer medications as ordered by provider, will assess and evaluate patient's response and provide education to patient for prescribed medication. RN will report any adverse and/or side effects to prescribing provider.  Therapeutic Interventions: 1 on 1 counseling sessions, Psychoeducation, Medication administration, Evaluate responses to treatment, Monitor vital signs and CBGs as ordered, Perform/monitor CIWA, COWS, AIMS and Fall Risk screenings as ordered, Perform wound care treatments as ordered.  Evaluation of Outcomes: Progressing   LCSW Treatment Plan for Primary Diagnosis: MDD (major depressive disorder), recurrent episode, severe (HCC) Long Term Goal(s): Safe transition to appropriate next level of care at discharge, Engage patient in therapeutic group addressing interpersonal  concerns.  Short Term Goals: Engage patient in aftercare planning with referrals and resources, Increase social support, Increase emotional regulation, Facilitate acceptance of mental health diagnosis and concerns, Identify triggers associated with mental health/substance abuse issues, and Increase skills for wellness and recovery  Therapeutic Interventions: Assess for all discharge needs, 1 to 1 time with Social worker, Explore available resources and support systems, Assess for adequacy in community support network, Educate family and significant other(s) on suicide prevention, Complete Psychosocial Assessment, Interpersonal group therapy.  Evaluation of Outcomes: Progressing   Progress in Treatment: Attending groups: Yes. Participating in groups: Yes. Taking medication as prescribed: Yes. Toleration medication: Yes. Family/Significant other contact made: No, will contact:  when Pt provides consent Patient understands diagnosis: Yes. Discussing patient identified problems/goals with staff: Yes. Medical problems stabilized or resolved: No. Denies suicidal/homicidal ideation: Yes. Issues/concerns per patient self-inventory: No.  New problem(s) identified: No, Describe:  none reported  New Short Term/Long Term Goal(s):medication stabilization, elimination of SI thoughts, development of comprehensive mental wellness plan.    Patient Goals:  "get meds balanced a little better."  Discharge Plan or Barriers: Patient recently admitted. CSW will continue to follow and assess for appropriate referrals and possible discharge planning.    Reason for Continuation of Hospitalization: Depression Medication stabilization Suicidal ideation  Estimated Length of Stay: 5-7 days  Last 3 [place] Suicide Severity Risk Score: Flowsheet Row Ad[facility] (Current) [facility] from 03/15/2023 in [facility]  Admission (Discharged) [facility]  C-SSRS RISK CATEGORY Low Risk High Risk Low Risk       Last PHQ 2/9 Scores:     No data to display          Scribe for Treatment Team: Izell Midway, [place] Mt 03/17/2023 2:14 PM

## 2023-03-18 DIAGNOSIS — F332 Major depressive disorder, recurrent severe without psychotic features: Secondary | ICD-10-CM | POA: Diagnosis not present

## 2023-03-18 NOTE — Group Note (Signed)
LCSW Group Therapy Note   Group Date: 03/18/2023 Start Time: 1100 End Time: 1200  Type of Therapy and Topic:  Group Therapy - Who Am I?  Participation Level:  Active  Description of Group The focus of this group was to aid patients in self-exploration and awareness. Patients were guided in exploring various factors of oneself to include interests, readiness to change, management of emotions, and individual perception of self. Patients were provided with complementary worksheets exploring hidden talents, ease of asking other for help, music/media preferences, understanding and responding to feelings/emotions, and hope for the future. At group closing, patients were encouraged to adhere to discharge plan to assist in continued self-exploration and understanding.  Therapeutic Goals Patients learned that self-exploration and awareness is an ongoing process Patients identified their individual skills, preferences, and abilities Patients explored their openness to establish and confide in supports Patients explored their readiness for change and progression of mental health  Summary of Patient Progress:  Patient actively engaged in introductory check-in. Patient actively engaged in activity of self-exploration and identification, completing complementary worksheet to assist in discussion. Patient identified various factors ranging from hidden talents, favorite music and movies, trusted individuals, accountability, and individual perceptions of self and hope. Pt engaged in processing thoughts and feelings as well as means of reframing thoughts. Pt proved receptive of alternate group members input and feedback from CSW.   Therapeutic Modalities Cognitive Behavioral Therapy Motivational Interviewing  Kathrynn Humble 03/18/2023  12:38 PM

## 2023-03-18 NOTE — Group Note (Signed)
Date:  03/18/2023 Time:  3:45 PM  Group Topic/Focus:  Goals Group:   The focus of this group is to help patients establish daily goals to achieve during treatment and discuss how the patient can incorporate goal setting into their daily lives to aide in recovery.    Participation Level: Did not ttend  Participation Quality:      Affect:      Cognitive:      Insight: None  Engagement in Group:    Modes of Intervention:      Additional Comments:     Reymundo Poll 03/18/2023, 3:45 PM

## 2023-03-18 NOTE — Progress Notes (Signed)
   03/18/23 2300  Psych Admission Type (Psych Patients Only)  Admission Status Involuntary  Psychosocial Assessment  Patient Complaints Depression  Eye Contact Fair  Facial Expression Animated  Affect Depressed  Speech Logical/coherent  Interaction Assertive  Motor Activity Slow  Appearance/Hygiene Unremarkable  Behavior Characteristics Cooperative  Mood Depressed  Thought Process  Coherency WDL  Content WDL  Delusions None reported or observed  Perception WDL  Hallucination None reported or observed  Judgment Poor  Confusion None  Danger to Self  Current suicidal ideation? Denies  Agreement Not to Harm Self Yes  Description of Agreement verbal  Danger to Others  Danger to Others None reported or observed  Danger to Others Abnormal  Harmful Behavior to others No threats or harm toward other people  Destructive Behavior No threats or harm toward property

## 2023-03-18 NOTE — [facility] Group Notes (Signed)
[facility] Group Notes:  (Nursing/MHT/Case Management/Adjunct)  Date:  03/18/2023  Time:  9:24 PM  Type of Therapy:   wrap-up group  Participation Level:  Did Not Attend  Participation Quality:    Affect:    Cognitive:    Insight:    Engagement in Group:    Modes of Intervention:    Summary of Progress/Problems: Pt refused to attend group.   Noah Delaine 03/18/2023, 9:24 PM

## 2023-03-18 NOTE — Plan of Care (Signed)

## 2023-03-18 NOTE — Progress Notes (Signed)
   03/18/23 1200  Psychosocial Assessment  Patient Complaints Depression  Eye Contact Fair  Affect Depressed  Speech Logical/coherent  Interaction Assertive  Motor Activity Slow  Appearance/Hygiene Unremarkable  Behavior Characteristics Cooperative  Mood Depressed  Thought Process  Coherency WDL  Content WDL  Delusions None reported or observed  Perception WDL  Hallucination None reported or observed  Judgment Poor  Confusion None  Danger to Self  Current suicidal ideation? Denies  Agreement Not to Harm Self Yes  Description of Agreement verbal contract  Danger to Others  Danger to Others None reported or observed

## 2023-03-18 NOTE — Progress Notes (Signed)
[facility] MD Progress Note  03/18/2023 9:13 AM Vincent Frank  MRN:  [id number]  Reason for admission: 22 year old Caucasian male with prior hx of mental health issues & self-injurious behaviors. Patient was admitted treated & discharged from this [facility] last month after receiving mood stabilization treatments for similar complaints (cutting). He was discharged on medications & an outpatient psychiatric services for medication management.counseling sessions. This time around, Vincent Frank is being admitted under an IVC petition from the [facility] ED with complaint of suicidal attempt by self-inflicted  lacerations to his wrist & leg areas. The laceration to his left wrist let to patient sustaining radial nerve injuries. A review of his toxicology test results has shown negative results all substances.   Daily notes: Vincent Frank is seen in his room. Chart reviewed. The chart findings discussed with the treatment team. He was lying down bed. He is making a good eye contact. He says he has been to one group today, but has to come back to his room because he does not like too many group of people. He says he is doing much better. Wondering when he could be discharged. Says would like to be discharged to spend sometime with her dad prior to his dad going back to teach school. Vincent Frank currently denies any SIHI, AVH, delusional thoughts or paranoia. The self inflicted wound to his forearm & leg areas remain intact. There are no changes made on his current plan of care. Will continue as already in progress.   Principal Problem: MDD (major depressive disorder), recurrent episode, severe (HCC)  Diagnosis: Principal Problem:   MDD (major depressive disorder), recurrent episode, severe (HCC)  Total Time spent with patient: 45 minutes  Past Psychiatric History: See H&P  Past Medical History:  Past Medical History:  Diagnosis Date   Major depressive disorder     History reviewed. No pertinent surgical  history.  Family History: History reviewed. No pertinent family history.  Family Psychiatric  History: See H&P.  Social History:  Social History   Substance and Sexual Activity  Alcohol Use Never     Social History   Substance and Sexual Activity  Drug Use Never    Social History   Socioeconomic History   Marital status: Single    Spouse name: Not on file   Number of children: Not on file   Years of education: Not on file   Highest education level: Not on file  Occupational History   Not on file  Tobacco Use   Smoking status: Never   Smokeless tobacco: Never  Vaping Use   Vaping status: Never Used  Substance and Sexual Activity   Alcohol use: Never   Drug use: Never   Sexual activity: Not on file  Other Topics Concern   Not on file  Social History Narrative   Not on file   Social Determinants of Health   Financial Resource Strain: Not on file  Food Insecurity: No Food Insecurity (03/16/2023)   Hunger Vital Sign    Worried About Running Out of Food in the Last Year: Never true    Ran Out of Food in the Last Year: Never true  Transportation Needs: No Transportation Needs (03/16/2023)   PRAPARE - Administrator, Civil Service (Medical): No    Lack of Transportation (Non-Medical): No  Physical Activity: Not on file  Stress: Not on file  Social Connections: Unknown (01/16/2023)   Received from [facility]   Social Network  Social Network: Not on file   Additional Social History:   Sleep: Good  Appetite:  Good  Current Medications: Current Facility-Administered Medications  Medication Dose Route Frequency Provider Last Rate Last Admin   acetaminophen (TYLENOL) tablet 650 mg  650 mg Oral Q6H PRN Abbott Pao, Nadir, MD       acetaminophen (TYLENOL) tablet 650 mg  650 mg Oral Q6H PRN Abbott Pao, Nadir, MD   650 mg at 03/17/23 2119   alum & mag hydroxide-simeth (MAALOX/MYLANTA) 200-200-20 MG/5ML suspension 30 mL  30 mL Oral Q4H PRN Abbott Pao, Nadir, MD        ARIPiprazole (ABILIFY) tablet 2 mg  2 mg Oral QHS Marty Sadlowski I, NP   2 mg at 03/17/23 2123   cephALEXin (KEFLEX) capsule 500 mg  500 mg Oral Q8H Attiah, Nadir, MD   500 mg at 03/18/23 8295   diphenhydrAMINE (BENADRYL) capsule 50 mg  50 mg Oral TID PRN Sarita Bottom, MD       Or   diphenhydrAMINE (BENADRYL) injection 50 mg  50 mg Intramuscular TID PRN Abbott Pao, Nadir, MD       FLUoxetine (PROZAC) capsule 40 mg  40 mg Oral QHS Upton Russey I, NP       hydrOXYzine (ATARAX) tablet 25 mg  25 mg Oral Q6H PRN Jillane Po I, NP       LORazepam (ATIVAN) tablet 2 mg  2 mg Oral TID PRN Abbott Pao, Nadir, MD       Or   LORazepam (ATIVAN) injection 2 mg  2 mg Intramuscular TID PRN Abbott Pao, Nadir, MD       magnesium hydroxide (MILK OF MAGNESIA) suspension 30 mL  30 mL Oral Daily PRN Abbott Pao, Nadir, MD       neomycin-bacitracin-polymyxin (NEOSPORIN) ointment   Topical BID Armandina Stammer I, NP   Given at 03/18/23 0809   oxyCODONE-acetaminophen (PERCOCET/ROXICET) 5-325 MG per tablet 1 tablet  1 tablet Oral Q8H PRN Abbott Pao, Nadir, MD   1 tablet at 03/18/23 0807   ziprasidone (GEODON) injection 20 mg  20 mg Intramuscular BID PRN Sarita Bottom, MD        Lab Results: No results found for this or any previous visit (from the past 48 hour(s)).  Blood Alcohol level:  Lab Results  Component Value Date   ETH <10 02/28/2023   ETH <10 11/27/2020    Metabolic Disorder Labs: Lab Results  Component Value Date   HGBA1C 5.2 03/03/2023   MPG 102.54 03/03/2023   MPG 96.8 07/07/2021   No results found for: "PROLACTIN" Lab Results  Component Value Date   CHOL 217 (H) 03/03/2023   TRIG 82 03/03/2023   HDL 39 (L) 03/03/2023   CHOLHDL 5.6 03/03/2023   VLDL 16 03/03/2023   LDLCALC 162 (H) 03/03/2023   LDLCALC 138 (H) 07/07/2021   Physical Findings: AIMS:  , ,  ,  ,    CIWA:    COWS:     Musculoskeletal: Strength & Muscle Tone: within normal limits Gait & Station: normal Patient leans: N/A  Psychiatric  Specialty Exam:  Presentation  General Appearance:  Appropriate for Environment; Casual (Presens with several self-inflicted lacerations to fore-arm & lower leg areas. The lcerations to le areas with staples.)  Eye Contact: Good  Speech: Clear and Coherent; Normal Rate  Speech Volume: Normal  Handedness: Right   Mood and Affect  Mood: Depressed  Affect: Congruent; Depressed   Thought Process  Thought Processes: Coherent; Goal Directed; Linear  Descriptions of Associations:Intact  Orientation:Full (Time, Place and Person)  Thought Content:Logical  History of Schizophrenia/Schizoaffective disorder:No data recorded Duration of Psychotic Symptoms:No data recorded Hallucinations:Hallucinations: None  Ideas of Reference:None  Suicidal Thoughts:Suicidal Thoughts: No SI Active Intent and/or Plan: Without Intent; Without Plan; Without Means to Carry Out; Without Access to Means  Homicidal Thoughts:Homicidal Thoughts: No   Sensorium  Memory: Immediate Good; Recent Good; Remote Good  Judgment: Fair  Insight: Fair   Art therapist  Concentration: Good  Attention Span: Good  Recall: Good  Fund of Knowledge: Fair  Language: Good   Psychomotor Activity  Psychomotor Activity: Psychomotor Activity: Normal   Assets  Assets: Communication Skills; Desire for Improvement; Financial Resources/Insurance; Housing; Physical Health; Resilience; Social Support; Talents/Skills   Sleep  Sleep: Sleep: Good Number of Hours of Sleep: 7.5    Physical Exam: Physical Exam Vitals and nursing note reviewed.  HENT:     Nose: Nose normal.     Mouth/Throat:     Pharynx: Oropharynx is clear.  Eyes:     Pupils: Pupils are equal, round, and reactive to light.  Cardiovascular:     Rate and Rhythm: Normal rate.     Pulses: Normal pulses.  Pulmonary:     Effort: Pulmonary effort is normal.  Genitourinary:    Comments: Deferred Musculoskeletal:         General: Normal range of motion.     Cervical back: Normal range of motion.  Skin:    General: Skin is warm and dry.  Neurological:     General: No focal deficit present.     Mental Status: He is alert and oriented to person, place, and time.    Review of Systems  Constitutional:  Negative for chills, diaphoresis and fever.  HENT:  Negative for congestion and sore throat.   Respiratory:  Negative for cough, shortness of breath and wheezing.   Cardiovascular:  Negative for chest pain and palpitations.  Gastrointestinal:  Negative for abdominal pain, constipation, diarrhea, heartburn, nausea and vomiting.  Musculoskeletal:  Negative for joint pain and myalgias.  Neurological:  Negative for dizziness, tingling, tremors, sensory change, speech change, focal weakness, seizures, loss of consciousness, weakness and headaches.  Endo/Heme/Allergies:        Allergies: Haldol,  Other: Hornet Venom, Bee venom.  Psychiatric/Behavioral:  Positive for depression.    Blood pressure 126/69, pulse 94, temperature 97.6 F (36.4 C), temperature source Oral, resp. rate 16, height 6\' 3"  (1.905 m), weight 105.6 kg, SpO2 100%. Body mass index is 29.1 kg/m.  Treatment Plan Summary: Daily contact with patient to assess and evaluate symptoms and progress in treatment and Medication management.   Continue inpatient hospitalization.  Principal/active diagnoses.  MDD (major depressive disorder), recurrent episode, severe (HCC).  Hx. Oppositional defiant disorder.    Associated symptoms.  Suicide attempt.  Self-inflicted lacerations to wrist/legs. Impulsive behaviors.  Plan: The risks/benefits/side-effects/alternatives to the medications in use were discussed in detail with the patient and time was given for patient's questions. The patient consents to medication trial.    -Abilify 2 mg po Q bedtime as an adjunct to antidepressant.. -Continue Prozac 40 mg po Q hs for depression (home med).   -Continue Hydroxyzine 25 mg po qid prn for anxiety. -Continue Trazodone 50 mg po Q hs prn for insomnia.    Agitation protocols. -Zyprexa zydis 5 mg tid prn. -& -Lorazepam 1 mg po prn x 1.  -& Geodon 20 mg IM prn x 1 dose.   Other medical issues being addressed.  -Continue  Oxycodone-acetaminophen 5-325 mg (1 tab) po tid prn for pain.  -Antibiotic ointment apply to affected area bid till wound heals.    Other PRNS -Continue Tylenol 650 mg every 6 hours PRN for mild pain -Continue Maalox 30 ml Q 4 hrs PRN for indigestion -Continue MOM 30 ml po Q 6 hrs for constipation   Safety and Monitoring: Voluntary admission to inpatient psychiatric unit for safety, stabilization and treatment Daily contact with patient to assess and evaluate symptoms and progress in treatment Patient's case to be discussed in multi-disciplinary team meeting Observation Level : q15 minute checks Vital signs: q12 hours Precautions: Safety   Discharge Planning: Social work and case management to assist with discharge planning and identification of hospital follow-up needs prior to discharge Estimated LOS: 5-7 days Discharge Concerns: Need to establish a safety plan; Medication compliance and effectiveness Discharge Goals: Return home with outpatient referrals for mental health follow-up including medication management/psychotherapy  Armandina Stammer, NP, pmhnp, fnp-bc. 03/18/2023, 9:13 AM

## 2023-03-18 NOTE — Progress Notes (Signed)
   03/18/23 0500  Psych Admission Type (Psych Patients Only)  Admission Status Involuntary  Psychosocial Assessment  Patient Complaints Anxiety;Depression  Eye Contact Fair  Facial Expression Animated  Affect Depressed  Speech Logical/coherent  Interaction Assertive  Motor Activity Slow  Appearance/Hygiene Unremarkable  Behavior Characteristics Cooperative  Mood Depressed  Aggressive Behavior  Effect No apparent injury  Thought Process  Coherency WDL  Content WDL  Delusions None reported or observed  Perception WDL  Hallucination None reported or observed  Judgment Poor  Confusion WDL  Danger to Self  Current suicidal ideation? Denies  Agreement Not to Harm Self Yes  Description of Agreement  (verbal)  Danger to Others  Danger to Others None reported or observed  Danger to Others Abnormal  Harmful Behavior to others No threats or harm toward other people  Destructive Behavior No threats or harm toward property

## 2023-03-19 DIAGNOSIS — F332 Major depressive disorder, recurrent severe without psychotic features: Secondary | ICD-10-CM | POA: Diagnosis not present

## 2023-03-19 MED ORDER — IBUPROFEN 800 MG PO TABS
800.0000 mg | ORAL_TABLET | Freq: Four times a day (QID) | ORAL | Status: DC | PRN
  Administered 2023-03-19: 800 mg via ORAL
  Filled 2023-03-19: qty 1

## 2023-03-19 NOTE — Progress Notes (Signed)
[facility] MD Progress Note  03/19/2023 5:48 PM Vincent Frank  MRN:  [id number]  Reason for admission: 22 year old Caucasian male with prior hx of mental health issues & self-injurious behaviors. Patient was admitted treated & discharged from this [facility] last month after receiving mood stabilization treatments for similar complaints (cutting). He was discharged on medications & an outpatient psychiatric services for medication management.counseling sessions. This time around, Vincent Frank is being admitted under an IVC petition from the [facility] ED with complaint of suicidal attempt by self-inflicted  lacerations to his wrist & leg areas. The laceration to his left wrist let to patient sustaining radial nerve injuries. A review of his toxicology test results has shown negative results all substances.   Daily notes: Vincent Frank is seen in his room. Chart reviewed. The chart findings discussed with the treatment team. He presents alert, oriented & aware of situation. He remains visible on the unit, attending group sessions. He continues to show improved mood, gradually on daily basis. He says he is coming along well. Says he is taking his medications, denies any side effects. He complain of pain to the wound to his left arm. He is requesting something for pain. He is started on Ibuprofen 800 mg po qid prn for pain. The wound to the forearm is being cleaned & dressed as it has been drainage a bit serosanguinous fluid. There are no signs of infection noted. Patient is currently being monitored closely for any side effects to Abilify on a reduced dose. So far, he is tolerating his treatment regimen. He currently denies any SIHI, AVH, delusional thoughts or paranoia. He does not appear to be responding to any internal stimuli. Discussed this case with the attending psychiatrist. Continue as already in progress. Vitals sings remain stable.  Principal Problem: MDD (major depressive disorder), recurrent episode,  severe (HCC)  Diagnosis: Principal Problem:   MDD (major depressive disorder), recurrent episode, severe (HCC)  Total Time spent with patient: 45 minutes  Past Psychiatric History: See H&P  Past Medical History:  Past Medical History:  Diagnosis Date   Major depressive disorder     History reviewed. No pertinent surgical history.  Family History: History reviewed. No pertinent family history.  Family Psychiatric  History: See H&P.  Social History:  Social History   Substance and Sexual Activity  Alcohol Use Never     Social History   Substance and Sexual Activity  Drug Use Never    Social History   Socioeconomic History   Marital status: Single    Spouse name: Not on file   Number of children: Not on file   Years of education: Not on file   Highest education level: Not on file  Occupational History   Not on file  Tobacco Use   Smoking status: Never   Smokeless tobacco: Never  Vaping Use   Vaping status: Never Used  Substance and Sexual Activity   Alcohol use: Never   Drug use: Never   Sexual activity: Not on file  Other Topics Concern   Not on file  Social History Narrative   Not on file   Social Determinants of Health   Financial Resource Strain: Not on file  Food Insecurity: No Food Insecurity (03/16/2023)   Hunger Vital Sign    Worried About Running Out of Food in the Last Year: Never true    Ran Out of Food in the Last Year: Never true  Transportation Needs: No Transportation Needs (03/16/2023)   PRAPARE -  Administrator, Civil Service (Medical): No    Lack of Transportation (Non-Medical): No  Physical Activity: Not on file  Stress: Not on file  Social Connections: Unknown (01/16/2023)   Received from [facility]   Social Network    Social Network: Not on file   Additional Social History:   Sleep: Good  Appetite:  Good  Current Medications: Current Facility-Administered Medications  Medication Dose Route Frequency Provider  Last Rate Last Admin   acetaminophen (TYLENOL) tablet 650 mg  650 mg Oral Q6H PRN Abbott Pao, Nadir, MD       alum & mag hydroxide-simeth (MAALOX/MYLANTA) 200-200-20 MG/5ML suspension 30 mL  30 mL Oral Q4H PRN Abbott Pao, Nadir, MD       ARIPiprazole (ABILIFY) tablet 2 mg  2 mg Oral QHS Charon Akamine I, NP   2 mg at 03/18/23 2119   cephALEXin (KEFLEX) capsule 500 mg  500 mg Oral Q8H Attiah, Nadir, MD   500 mg at 03/19/23 1443   diphenhydrAMINE (BENADRYL) capsule 50 mg  50 mg Oral TID PRN Sarita Bottom, MD       Or   diphenhydrAMINE (BENADRYL) injection 50 mg  50 mg Intramuscular TID PRN Abbott Pao, Nadir, MD       FLUoxetine (PROZAC) capsule 40 mg  40 mg Oral QHS Shabre Kreher, Nicole Kindred I, NP   40 mg at 03/18/23 2119   hydrOXYzine (ATARAX) tablet 25 mg  25 mg Oral Q6H PRN Armandina Stammer I, NP       ibuprofen (ADVIL) tablet 800 mg  800 mg Oral Q6H PRN Jp Eastham I, NP       LORazepam (ATIVAN) tablet 2 mg  2 mg Oral TID PRN Sarita Bottom, MD       Or   LORazepam (ATIVAN) injection 2 mg  2 mg Intramuscular TID PRN Abbott Pao, Nadir, MD       magnesium hydroxide (MILK OF MAGNESIA) suspension 30 mL  30 mL Oral Daily PRN Abbott Pao, Nadir, MD       neomycin-bacitracin-polymyxin (NEOSPORIN) ointment   Topical BID Lashanna Angelo I, NP   Given at 03/19/23 0806   ziprasidone (GEODON) injection 20 mg  20 mg Intramuscular BID PRN Sarita Bottom, MD        Lab Results: No results found for this or any previous visit (from the past 48 hour(s)).  Blood Alcohol level:  Lab Results  Component Value Date   ETH <10 02/28/2023   ETH <10 11/27/2020    Metabolic Disorder Labs: Lab Results  Component Value Date   HGBA1C 5.2 03/03/2023   MPG 102.54 03/03/2023   MPG 96.8 07/07/2021   No results found for: "PROLACTIN" Lab Results  Component Value Date   CHOL 217 (H) 03/03/2023   TRIG 82 03/03/2023   HDL 39 (L) 03/03/2023   CHOLHDL 5.6 03/03/2023   VLDL 16 03/03/2023   LDLCALC 162 (H) 03/03/2023   LDLCALC 138 (H) 07/07/2021    Physical Findings: AIMS:  , ,  ,  ,    CIWA:    COWS:     Musculoskeletal: Strength & Muscle Tone: within normal limits Gait & Station: normal Patient leans: N/A  Psychiatric Specialty Exam:  Presentation  General Appearance:  Casual; Appropriate for Environment; Fairly Groomed  Eye Contact: Good  Speech: Clear and Coherent; Normal Rate  Speech Volume: Normal  Handedness: Right   Mood and Affect  Mood: -- (Improving.)  Affect: Congruent  Thought Process  Thought Processes: Coherent; Goal Directed; Linear  Descriptions of Associations:Intact  Orientation:Full (Time, Place and Person)  Thought Content:Logical  History of Schizophrenia/Schizoaffective disorder:No data recorded Duration of Psychotic Symptoms:No data recorded Hallucinations:Hallucinations: None  Ideas of Reference:None  Suicidal Thoughts:Suicidal Thoughts: No SI Active Intent and/or Plan: Without Intent; Without Means to Carry Out; Without Plan  Homicidal Thoughts:Homicidal Thoughts: No  Sensorium  Memory: Immediate Good; Recent Good; Remote Good  Judgment: Fair  Insight: Fair  Art therapist  Concentration: Good  Attention Span: Good  Recall: Good  Fund of Knowledge: Fair  Language: Good  Psychomotor Activity  Psychomotor Activity: Psychomotor Activity: Normal  Assets  Assets: Communication Skills; Desire for Improvement; Financial Resources/Insurance; Housing; Physical Health; Resilience; Social Support  Sleep  Sleep: Sleep: Good Number of Hours of Sleep: 8  Physical Exam: Physical Exam Vitals and nursing note reviewed.  HENT:     Nose: Nose normal.     Mouth/Throat:     Pharynx: Oropharynx is clear.  Eyes:     Pupils: Pupils are equal, round, and reactive to light.  Cardiovascular:     Rate and Rhythm: Normal rate.     Pulses: Normal pulses.  Pulmonary:     Effort: Pulmonary effort is normal.  Genitourinary:    Comments:  Deferred Musculoskeletal:        General: Normal range of motion.     Cervical back: Normal range of motion.  Skin:    General: Skin is warm and dry.  Neurological:     General: No focal deficit present.     Mental Status: He is alert and oriented to person, place, and time.    Review of Systems  Constitutional:  Negative for chills, diaphoresis and fever.  HENT:  Negative for congestion and sore throat.   Respiratory:  Negative for cough, shortness of breath and wheezing.   Cardiovascular:  Negative for chest pain and palpitations.  Gastrointestinal:  Negative for abdominal pain, constipation, diarrhea, heartburn, nausea and vomiting.  Musculoskeletal:  Negative for joint pain and myalgias.  Neurological:  Negative for dizziness, tingling, tremors, sensory change, speech change, focal weakness, seizures, loss of consciousness, weakness and headaches.  Endo/Heme/Allergies:        Allergies: Haldol,  Other: Hornet Venom, Bee venom.  Psychiatric/Behavioral:  Positive for depression.    Blood pressure 118/74, pulse (!) 101, temperature 98.1 F (36.7 C), temperature source Oral, resp. rate 18, height 6\' 3"  (1.905 m), weight 105.6 kg, SpO2 99%. Body mass index is 29.1 kg/m.  Treatment Plan Summary: Daily contact with patient to assess and evaluate symptoms and progress in treatment and Medication management.   Continue inpatient hospitalization.  Principal/active diagnoses.  MDD (major depressive disorder), recurrent episode, severe (HCC).  Hx. Oppositional defiant disorder.    Associated symptoms.  Suicide attempt.  Self-inflicted lacerations to wrist/legs. Impulsive behaviors.  Plan: The risks/benefits/side-effects/alternatives to the medications in use were discussed in detail with the patient and time was given for patient's questions. The patient consents to medication trial.    -Abilify 2 mg po Q bedtime as an adjunct to antidepressant.. -Continue Prozac 40 mg po Q hs  for depression (home med).  -Continue Hydroxyzine 25 mg po qid prn for anxiety. -Continue Trazodone 50 mg po Q hs prn for insomnia.    Agitation protocols. -Zyprexa zydis 5 mg tid prn. -& -Lorazepam 1 mg po prn x 1.  -& Geodon 20 mg IM prn x 1 dose.   Other medical issues being addressed.  -Continue Oxycodone-acetaminophen 5-325 mg (1 tab) po tid  prn for pain.  -Antibiotic ointment apply to affected area bid till wound heals.    Other PRNS -Continue Tylenol 650 mg every 6 hours PRN for mild pain. -Continue Maalox 30 ml Q 4 hrs PRN for indigestion. -Continue MOM 30 ml po Q 6 hrs for constipation.  -Ibuprofen 800 mg po qid prn for pain.   Safety and Monitoring: Voluntary admission to inpatient psychiatric unit for safety, stabilization and treatment Daily contact with patient to assess and evaluate symptoms and progress in treatment Patient's case to be discussed in multi-disciplinary team meeting Observation Level : q15 minute checks Vital signs: q12 hours Precautions: Safety   Discharge Planning: Social work and case management to assist with discharge planning and identification of hospital follow-up needs prior to discharge Estimated LOS: 5-7 days Discharge Concerns: Need to establish a safety plan; Medication compliance and effectiveness Discharge Goals: Return home with outpatient referrals for mental health follow-up including medication management/psychotherapy  Armandina Stammer, NP, pmhnp, fnp-bc. 03/19/2023, 5:48 PM Patient ID: Vincent Frank, male   DOB: [birth year], 22 y.o.   MRN: [id number]

## 2023-03-19 NOTE — Plan of Care (Signed)
?  Problem: Education: ?Goal: Emotional status will improve ?Outcome: Progressing ?Goal: Mental status will improve ?Outcome: Progressing ?  ?Problem: Health Behavior/Discharge Planning: ?Goal: Compliance with treatment plan for underlying cause of condition will improve ?Outcome: Progressing ?  ?Problem: Safety: ?Goal: Periods of time without injury will increase ?Outcome: Progressing ?  ?

## 2023-03-19 NOTE — [facility] Group Notes (Signed)
Adult Psychoeducational Group Note  Date:  03/19/2023 Time:  9:25 PM  Group Topic/Focus:  Wrap-Up Group:   The focus of this group is to help patients review their daily goal of treatment and discuss progress on daily workbooks.  Participation Level:  Active  Participation Quality:  Attentive  Affect:  Appropriate  Cognitive:  Alert  Insight: Improving  Engagement in Group:  Engaged  Modes of Intervention:  Discussion  Additional Comments:  Pt attended and participated in group. Maura Crandall Cassandra 03/19/2023, 9:25 PM

## 2023-03-19 NOTE — Progress Notes (Signed)
Patient observed laying in bed reading a book this morning. Patient states he slept fairly and states improved mood/stabilization after being back on medication. Patient states his appetite has been adequate and states his depression is a 5 out of 10 and anxiety a 3 out of 10. Patient denies SI,HI, and A/V/H with no plan or intent. Patient received tylenol po prn due to pain at lacerations. Patient verbalized no further concerns at this time and remains cooperative on unit.

## 2023-03-19 NOTE — Plan of Care (Signed)

## 2023-03-19 NOTE — Group Note (Signed)
Recreation Therapy Group Note   Group Topic:Relaxation  Group Date: 03/19/2023 Start Time: 1610 End Time: 1015 Facilitators: Freeda Spivey-McCall, LRT,CTRS Location: [address] Dayroom   Goal Area(s) Addresses:  Patient will identify positive stress management techniques. Patient will identify benefits of using stress management post d/c.  Group Description: Meditation.  LRT and patients discussed the importance of taking time to release stress and clear the mind. LRT played a meditation that walked patients through breathing techniques and ways to refocus on positive feelings and mindset.    Affect/Mood: Appropriate   Participation Level: Engaged   Participation Quality: Independent   Behavior: Appropriate   Speech/Thought Process: Focused   Insight: Good   Judgement: Good   Modes of Intervention: Meditation   Patient Response to Interventions:  Engaged   Education Outcome:  Acknowledges education   Clinical Observations/Individualized Feedback: Pt attended and participated in group session.    Plan: Continue to engage patient in RT group sessions 2-3x/week.   Jiah Bari-McCall, LRT,CTRS 03/19/2023 12:01 PM

## 2023-03-20 DIAGNOSIS — F332 Major depressive disorder, recurrent severe without psychotic features: Secondary | ICD-10-CM | POA: Diagnosis not present

## 2023-03-20 MED ORDER — FLUOXETINE HCL 40 MG PO CAPS: 40.0000 mg | ORAL_CAPSULE | Freq: Every day | ORAL | 0 refills | Status: DC

## 2023-03-20 MED ORDER — IBUPROFEN 800 MG PO TABS: 800.0000 mg | ORAL_TABLET | Freq: Four times a day (QID) | ORAL | 0 refills | Status: DC | PRN

## 2023-03-20 MED ORDER — BACITRACIN-NEOMYCIN-POLYMYXIN OINTMENT TUBE: 1.0000 | TOPICAL_OINTMENT | Freq: Two times a day (BID) | CUTANEOUS | Status: DC

## 2023-03-20 MED ORDER — HYDROXYZINE HCL 25 MG PO TABS: 25.0000 mg | ORAL_TABLET | Freq: Four times a day (QID) | ORAL | 0 refills | Status: DC | PRN

## 2023-03-20 MED ORDER — CEPHALEXIN 500 MG PO CAPS: 500.0000 mg | ORAL_CAPSULE | Freq: Three times a day (TID) | ORAL | 0 refills | Status: DC

## 2023-03-20 MED ORDER — ARIPIPRAZOLE 2 MG PO TABS: 2.0000 mg | ORAL_TABLET | Freq: Every day | ORAL | 0 refills | Status: DC

## 2023-03-20 NOTE — [facility] Suicide Risk Assessment (Signed)
[facility] Discharge Suicide Risk Assessment   Principal Problem: MDD (major depressive disorder), recurrent episode, severe (HCC) Discharge Diagnoses: Principal Problem:   MDD (major depressive disorder), recurrent episode, severe (HCC)   Total Time spent with patient: 15 minutes  Musculoskeletal: Strength & Muscle Tone: within normal limits Gait & Station: normal Patient leans: N/A  Psychiatric Specialty Exam  Presentation  General Appearance:  Casual; Appropriate for Environment; Fairly Groomed  Eye Contact: Good  Speech: Clear and Coherent; Normal Rate  Speech Volume: Normal  Handedness: Right   Mood and Affect  Mood: -- (Improving.)  Duration of Depression Symptoms: No data recorded Affect: Congruent   Thought Process  Thought Processes: Coherent; Goal Directed; Linear  Descriptions of Associations:Intact  Orientation:Full (Time, Place and Person)  Thought Content:Logical  History of Schizophrenia/Schizoaffective disorder:No data recorded Duration of Psychotic Symptoms:No data recorded Hallucinations:Hallucinations: None  Ideas of Reference:None  Suicidal Thoughts:Suicidal Thoughts: No SI Active Intent and/or Plan: Without Intent; Without Means to Carry Out; Without Plan  Homicidal Thoughts:Homicidal Thoughts: No   Sensorium  Memory: Immediate Good; Recent Good; Remote Good  Judgment: Fair  Insight: Fair   Art therapist  Concentration: Good  Attention Span: Good  Recall: Good  Fund of Knowledge: Fair  Language: Good   Psychomotor Activity  Psychomotor Activity: Psychomotor Activity: Normal   Assets  Assets: Communication Skills; Desire for Improvement; Financial Resources/Insurance; Housing; Physical Health; Resilience; Social Support   Sleep  Sleep: Sleep: Good Number of Hours of Sleep: 8   Physical Exam: Physical Exam HENT:     Head: Normocephalic and atraumatic.  Eyes:     Extraocular Movements:  Extraocular movements intact.  Pulmonary:     Effort: Pulmonary effort is normal.  Musculoskeletal:        General: Normal range of motion.     Cervical back: Normal range of motion.  Skin:    Findings: Laceration present.     Comments: BLE, L forearm lacerations, staples present. Some redness to forearm lacerations  Neurological:     General: No focal deficit present.     Mental Status: He is alert and oriented to person, place, and time.  Psychiatric:        Attention and Perception: Attention normal.        Mood and Affect: Mood and affect normal.        Speech: Speech normal.        Behavior: Behavior normal. Behavior is cooperative.        Thought Content: Thought content normal. Thought content does not include homicidal or suicidal ideation.        Cognition and Memory: Cognition and memory normal.        Judgment: Judgment normal.    ROS Blood pressure 133/63, pulse 93, temperature 97.7 F (36.5 C), temperature source Oral, resp. rate 18, height 6\' 3"  (1.905 m), weight 105.6 kg, SpO2 99%. Body mass index is 29.1 kg/m.  Mental Status Per Nursing Assessment::   On Admission:  NA  Demographic Factors:  Male, Adolescent or young adult, and Caucasian  Loss Factors: NA  Historical Factors: Prior suicide attempts  Risk Reduction Factors:   Living with another person, especially a relative and Positive social support  Continued Clinical Symptoms:  Depression:   Impulsivity  Cognitive Features That Contribute To Risk:  None    Suicide Risk:  Mild:  Suicidal ideation of limited frequency, intensity, duration, and specificity.  There are no identifiable plans, no associated intent, mild dysphoria and related symptoms,  good self-control (both objective and subjective assessment), few other risk factors, and identifiable protective factors, including available and accessible social support.   Follow-up Information     Windell Moment, MD. Go on 04/08/2023.   Why:  You have an appointment for medication management services on 04/08/23 at 1:00 pm.  This appointment will be held in person. Contact information: [address] [place], [place] [address]  Office: [phone] Fax: [phone]        [facility] Schedule an appointment as soon as possible for a visit.   Why: Please call to schedule an appointment with your provider for therapy services, as we were unable to contact prior to discharge. Contact information: [address] [place] [address] [phone]                 Plan Of Care/Follow-up recommendations:  Activity:  as tolerated Diet:  regular Other:    Prescriptions for new medications provided for the patient to bridge to follow up appointment. The patient was informed that refills for these prescriptions are generally not provided, and patient is encouraged to attend all follow up appointments to address medication refills and adjustments.   Today's discharge was reviewed with treatment team, and the team is in agreement that the patient is ready for discharge. The patient is was of the discharge plan for today and has been given opportunity to ask questions. At time of discharge, the patient does not vocalize any acute harm to self or others, is goal directed, able to advocate for self and organizational baseline.   At discharge, the patient is instructed to:  Take all medications as prescribed. Report any adverse effects and or reactions from the medicines to her outpatient provider promptly.  Do not engage in alcohol and/or illegal drug use while on prescription medicines.  In the event of worsening symptoms, patient is instructed to call the crisis hotline, 911 and or go to the nearest ED for appropriate evaluation and treatment of symptoms.  Follow-up with primary care provider for further care of medical issues, concerns and or health care needs.   Roselle Locus, MD 03/20/2023, 9:10 AM

## 2023-03-20 NOTE — Progress Notes (Signed)
Pt is A&OX4, calm, denies suicidal ideations, denies homicidal ideations, denies auditory hallucinations and denies visual hallucinations. Pt verbally agrees to approach staff if these become apparent and before harming self or others. Pt denies experiencing nightmares. Mood and affect are congruent. Pt appetite is ok. Pt complains of pain and discomfort to his left wrist. Writer administered prescribed PRN pain med. [name] memory appears to be grossly intact, and Pt hasn't displayed any injurious behaviors. Pt is medication compliant. There's no evidence of suicidal intent. Psychomotor activity was WNL. No s/s of Parkinson, Dystonia, Akathisia and/or Tardive Dyskinesia noted.

## 2023-03-20 NOTE — Discharge Summary (Signed)
Physician Discharge Summary Note  Patient:  Vincent Frank is an 22 y.o., male  MRN:  [id number]  DOB:  August 15, 2000  Patient phone:  [phone] (home)   Patient address:   [address] [place] [facility] [address],   Total Time spent with patient:  Greater than 30 minutes  Date of Admission:  03/16/2023  Date of Discharge: 03-20-23  Reason for Admission: Complaint of suicidal attempt by self-inflicted  lacerations to his wrist & leg areas.   Principal Problem: MDD (major depressive disorder), recurrent episode, severe (HCC)  Discharge Diagnoses: Principal Problem:   MDD (major depressive disorder), recurrent episode, severe (HCC)  Past Psychiatric History:  Previous [facility] hospitalization from 02-28-23 thru 03-04-23. Hx. Major depressive disorder, recurrent episodes. This his third suicide attempt by jumping off a roof in 2021) & self Injurious/mutilating behaviors (cutting). Patient report one previous psychiatric hospitalization at([name]) a Residential Stay in [facility] [place]).   Past Medical History:  Past Medical History:  Diagnosis Date   Major depressive disorder    History reviewed. No pertinent surgical history. Family History: History reviewed. No pertinent family history.  Family Psychiatric  History: Adopted at young age, so does not know much about family hx. Half-Brother: suffering from bipolar Disorder & substance abuse issues. He is currently in a rehab facility.   Social History: Single, has no children, employed as a Product/process development scientist, lives in [place], [place] with parents. Social History   Substance and Sexual Activity  Alcohol Use Never     Social History   Substance and Sexual Activity  Drug Use Never    Social History   Socioeconomic History   Marital status: Single    Spouse name: Not on file   Number of children: Not on file   Years of education: Not on file   Highest education level: Not on file  Occupational History   Not on  file  Tobacco Use   Smoking status: Never   Smokeless tobacco: Never  Vaping Use   Vaping status: Never Used  Substance and Sexual Activity   Alcohol use: Never   Drug use: Never   Sexual activity: Not on file  Other Topics Concern   Not on file  Social History Narrative   Not on file   Social Determinants of Health   Financial Resource Strain: Not on file  Food Insecurity: No Food Insecurity (03/16/2023)   Hunger Vital Sign    Worried About Running Out of Food in the Last Year: Never true    Ran Out of Food in the Last Year: Never true  Transportation Needs: No Transportation Needs (03/16/2023)   PRAPARE - Administrator, Civil Service (Medical): No    Lack of Transportation (Non-Medical): No  Physical Activity: Not on file  Stress: Not on file  Social Connections: Unknown (01/16/2023)   Received from [facility]   Social Network    Social Network: Not on file   Hospital Course: (Per admission evaluation notes): 22 year old Caucasian male with prior hx of mental health issues & self-injurious behaviors. Patient was admitted treated & discharged from this [facility] last month after receiving mood stabilization treatments for similar complaints (cutting). He was discharged on medications & an outpatient psychiatric services for medication management.counseling sessions. This time around, Vincent Frank is being admitted under an IVC petition from the [facility] ED with complaint of suicidal attempt by self-inflicted  lacerations to his wrist & leg areas. The laceration to his left  wrist let to patient sustaining radial nerve injuries. A review of his toxicology test results has shown negative results all substances.   Upon the decision by his treatment team to discharge Vincent Frank) today, he was seen & evaluated  by his treatment team for mood stability (stable). The current laboratory findings were reviewed, stable. The nurses notes & vital signs were reviewed as well.  All are stable. At this present time, there are no current mental health or medical issues that should prevent this discharge at this time. Patient is being discharged to continue mental health care, counseling services & medication management as noted below. He is also aware & agreeable to this discharge.  This is the second psychiatric admission/discharge summaries for this 22 year old Caucasian male from this [facility]. He was a patient in this [facility] from 02-28-23 thru 03-04-23 for psychiatric evaluation & treatments after a suicide attempt by self-inflicted cuts to his arm. Per chart review, patient has hx of prior suicide attempts by jumping off a roof in 2021. There have been documented reports of other prior psychiatric admissions/treatments at the [facility] in [place], [place] in 2021. Vincent Frank has hx of impulsive behaviors but no substance use disorder. His UDS result on this present admission was clear. His BAL was < 10 per toxicology reports. He was brought to the [facility] this time around with complaint of suicide attempt by self-inflicted lacerations to his leg & left fore-arm areas. He was recommended for mood stabilization treatments by his treatment.   After evaluation of his presenting symptoms, it was jointly agreed by the treatment team to recommend [facility] for mood stabilization treatments. The medication regimen targeting those presenting symptoms were discussed & initiated with his consent. He was treated, stabilized & discharged on the medications as listed below on his discharge medication lists. Vincent Frank reported on admission that after he was discharged from this [facility] on 03-04-23, his Abilify 5 mg started causing him a lot of problems. He adds that he was also being bullied on the social media because of his mental health hx. He says the bulling were difficult to bear because people were mocking him for having mental illness. And because his Abilify was no longer helping him, he felt helpless &  impulsively started cutting on himself. Besides the mood stabilization treatments, he was also enrolled & participated in the group counseling sessions being offered & held on this unit. He learned coping skills. He presented other medical issues (self-inflicted lacerations) to his forearm & leg areas that required treatment & or monitoring. He received wound care & antibiotic therapy for prevention of wound infection. He tolerated his treatment regimen without any adverse effects or reactions reported.    Vincent Frank's symptoms responded well to his treatment regimen warranting this discharge. This is evidenced by his reports of improved mood, resolution of symptoms, presentation of good affect/eye contact & absence of suicidal ideations. He currently presents mentally & medically stable for discharge to continue mental health care, medication management & counseling services on an outpatient basis as recommended below. Vincent Frank is instructed & encouraged to:  1. Clean the wounds to his fore-arm with normal saline, pat dry with gauze.  Apply the antibiotic ointment lightly to the wounds twice daily till wounds heal.   2. In the next 3-4 days, to go to an urgent care near you for staple removal.  Do not swim or emerge wounds in deep water till completely healed.  3. Be sure to complete your antibiotic  therapy as recommended to prevent wound infection.    Today upon this discharge evaluation with his treatment team, Vincent Frank shares, "I'm doing good. I feel much better. I feel so relieved now that I will be able to go home & spent sometime with my dad before he resumes teaching tomorrow. My father is my best friend". He denies any specific concerns. He is sleeping well. His appetite is good. He denies any other physical complaints. He denies any SIHI, AVH, delusional thoughts or paranoia. He does not appear as if responding to any internal stimuli. Vincent Frank is tolerating his medications well & in agreement to continue  his current regimen as recommended. He will follow up for routine psychiatric care, counseling sessions & medication management as noted below. He is provided with all the necessary information needed to make these appointments without problems. Vincent Frank was able to engage in safety planning including plan to return to [facility] or contact emergency services if he feels unable to maintain his own safety or the safety of others. Pt had no further questions, comments or concerns. He left [facility] with all personal belongings in no apparent distress. Transportation per her his family (father)..  Physical Findings: AIMS: Facial and Oral Movements Muscles of Facial Expression: None, normal Lips and Perioral Area: None, normal Jaw: None, normal Tongue: None, normal,Extremity Movements Upper (arms, wrists, hands, fingers): None, normal Lower (legs, knees, ankles, toes): None, normal, Trunk Movements Neck, shoulders, hips: None, normal, Overall Severity Severity of abnormal movements (highest score from questions above): None, normal Incapacitation due to abnormal movements: None, normal Patient's awareness of abnormal movements (rate only patient's report): No Awareness, Dental Status Current problems with teeth and/or dentures?: No Does patient usually wear dentures?: No  CIWA:    COWS:     Musculoskeletal: Strength & Muscle Tone: within normal limits Gait & Station: normal Patient leans: N/A   Psychiatric Specialty Exam:  Presentation  General Appearance:  Casual; Well Groomed  Eye Contact: Good  Speech: Clear and Coherent; Normal Rate  Speech Volume: Normal  Handedness: Right   Mood and Affect  Mood: Euthymic  Affect: Appropriate; Congruent  Thought Process  Thought Processes: Coherent; Goal Directed; Linear  Descriptions of Associations:Intact  Orientation:Full (Time, Place and Person)  Thought Content:Logical  History of Schizophrenia/Schizoaffective disorder:No data  recorded Duration of Psychotic Symptoms:No data recorded Hallucinations:Hallucinations: None  Ideas of Reference:None  Suicidal Thoughts:Suicidal Thoughts: No SI Active Intent and/or Plan: Without Intent; Without Plan; Without Means to Carry Out; Without Access to Means  Homicidal Thoughts:Homicidal Thoughts: No   Sensorium  Memory: Immediate Good; Recent Good; Remote Good  Judgment: Good  Insight: Good; Present  Executive Functions  Concentration: Good  Attention Span: Good  Recall: Good  Fund of Knowledge: Fair  Language: Good  Psychomotor Activity  Psychomotor Activity: Psychomotor Activity: Normal  Assets  Assets: Communication Skills; Desire for Improvement; Financial Resources/Insurance; Housing; Physical Health; Resilience; Social Support; Transportation; Vocational/Educational  Sleep  Sleep: Sleep: Good Number of Hours of Sleep: 8  Physical Exam: Physical Exam Vitals and nursing note reviewed.  HENT:     Head: Normocephalic.     Nose: Nose normal.     Mouth/Throat:     Pharynx: Oropharynx is clear.  Eyes:     Pupils: Pupils are equal, round, and reactive to light.  Cardiovascular:     Rate and Rhythm: Normal rate.     Pulses: Normal pulses.  Pulmonary:     Effort: Pulmonary effort is normal.  Genitourinary:  Comments: Deferred Musculoskeletal:        General: Normal range of motion.     Cervical back: Normal range of motion.  Skin:    General: Skin is warm and dry.  Neurological:     General: No focal deficit present.     Mental Status: He is alert and oriented to person, place, and time. Mental status is at baseline.    Review of Systems  Constitutional:  Negative for chills, diaphoresis and fever.  HENT:  Negative for congestion and sore throat.   Eyes:  Negative for blurred vision.  Respiratory:  Negative for cough, shortness of breath and wheezing.   Cardiovascular:  Negative for chest pain and palpitations.   Gastrointestinal:  Negative for heartburn, nausea and vomiting.  Genitourinary:  Negative for dysuria.  Musculoskeletal:  Positive for myalgias (wound to left fore-arm.). Negative for joint pain.  Skin:        Self-inflicted lacerations to left remains open with slight drainage (serosanguinous).  Provided with some antibiotic ointment packs to apply slightly bid till heal.    Neurological:  Negative for dizziness, tingling, tremors, sensory change, speech change, focal weakness, seizures, loss of consciousness, weakness and headaches.  Endo/Heme/Allergies:        Allergies: Haldol.   Other Bee Venom.  Psychiatric/Behavioral:  Positive for depression (Hx (stable on medication).). Negative for hallucinations, memory loss, substance abuse and suicidal ideas. The patient is not nervous/anxious and does not have insomnia.    Blood pressure 133/63, pulse 93, temperature 97.7 F (36.5 C), temperature source Oral, resp. rate 18, height 6\' 3"  (1.905 m), weight 105.6 kg, SpO2 99%. Body mass index is 29.1 kg/m.   Social History   Tobacco Use  Smoking Status Never  Smokeless Tobacco Never   Tobacco Cessation:  N/A, patient does not currently use tobacco products  Blood Alcohol level:  Lab Results  Component Value Date   ETH <10 02/28/2023   ETH <10 11/27/2020   Metabolic Disorder Labs:  Lab Results  Component Value Date   HGBA1C 5.2 03/03/2023   MPG 102.54 03/03/2023   MPG 96.8 07/07/2021   No results found for: "PROLACTIN" Lab Results  Component Value Date   CHOL 217 (H) 03/03/2023   TRIG 82 03/03/2023   HDL 39 (L) 03/03/2023   CHOLHDL 5.6 03/03/2023   VLDL 16 03/03/2023   LDLCALC 162 (H) 03/03/2023   LDLCALC 138 (H) 07/07/2021    See Psychiatric Specialty Exam and Suicide Risk Assessment completed by Attending Physician prior to discharge.  Discharge destination:  Home  Is patient on multiple antipsychotic therapies at discharge:  No   Has Patient had three or more  failed trials of antipsychotic monotherapy by history:  No  Recommended Plan for Multiple Antipsychotic Therapies: NA Discharge Instructions     Discharge instructions   Complete by: As directed    1. Clean the wounds to your fore-arm with normal saline, pat dry with gauze.  Apply the antibiotic ointment lightly to the wounds twice daily till wounds healed.   2. In the next 3-4 days, go to an urgent care near you for staple removal.  Do not swim or emerge wounds in deep water till completely healed.  3. Be sure to complete your antibiotic therapy to prevent wound infection.      Allergies as of 03/20/2023       Reactions   Bee Venom Anaphylaxis   Hornet Venom Anaphylaxis, Itching   Haldol [haloperidol] Other (See Comments)  Per father dystonic reaction with  muscle contractures.        Medication List     STOP taking these medications    EPINEPHrine 0.3 mg/0.3 mL Soaj injection Commonly known as: EPI-PEN       TAKE these medications      Indication  ARIPiprazole 2 MG tablet Commonly known as: ABILIFY Take 1 tablet (2 mg total) by mouth at bedtime. Antidepressant augmentation What changed:  medication strength how much to take when to take this additional instructions  Indication: Antidepressant augmentation.   cephALEXin 500 MG capsule Commonly known as: KEFLEX Take 1 capsule (500 mg total) by mouth every 8 (eight) hours. For wound infection  Indication: Infection of the Skin and/or Skin Structures   FLUoxetine 40 MG capsule Commonly known as: PROZAC Take 1 capsule (40 mg total) by mouth at bedtime. For depression What changed:  when to take this additional instructions  Indication: Major Depressive Disorder   hydrOXYzine 25 MG tablet Commonly known as: ATARAX Take 1 tablet (25 mg total) by mouth every 6 (six) hours as needed for anxiety.  Indication: Feeling Anxious   ibuprofen 800 MG tablet Commonly known as: ADVIL Take 1 tablet (800 mg  total) by mouth every 6 (six) hours as needed for moderate pain.  Indication: Pain   neomycin-bacitracin-polymyxin Oint Commonly known as: NEOSPORIN Apply 1 Application topically 2 (two) times daily. For wound care  Indication: Wound care        Follow-up Information     Windell Moment, MD. Go on 04/08/2023.   Why: You have an appointment for medication management services on 04/08/23 at 1:00 pm.  This appointment will be held in person. Contact information: [address] [place], [place] [address]  Office: [phone] Fax: [phone]        [facility] Schedule an appointment as soon as possible for a visit.   Why: Please call to schedule an appointment with your provider for therapy services, as we were unable to contact prior to discharge. Contact information: [address] [place] [address] [phone]                Follow-up recommendations: Activity:  As tolerated Diet: As recommended by your primary care doctor. Keep all scheduled follow-up appointments as recommended.  Comments: Comments: Patient is recommended to follow-up care on an outpatient basis as noted above. Prescriptions sent to pt's pharmacy of choice at discharge.   Patient agreeable to plan.   Given opportunity to ask questions.   Appears to feel comfortable with discharge denies any current suicidal or homicidal thought. Patient is also instructed prior to discharge to: Take all medications as prescribed by his/her mental healthcare provider. Report any adverse effects and or reactions from the medicines to his/her outpatient provider promptly. Patient has been instructed & cautioned: To not engage in alcohol and or illegal drug use while on prescription medicines. In the event of worsening symptoms, patient is instructed to call the crisis hotline, 911 and or go to the nearest ED for appropriate evaluation and treatment of symptoms. To follow-up with his/her primary  care provider for your other medical issues, concerns and or health care needs.  Signed: Armandina Stammer, NP, pmhnp, fnp-bc 03/20/2023, 11:47 AM

## 2023-03-20 NOTE — Progress Notes (Signed)
  [facility] Adult Case Management Discharge Plan :  Will you be returning to the same living situation after discharge:  Yes,  Patient will be returning home with his parents. At discharge, do you have transportation home?: Yes,  Patient's father to provide patient with transportation home. Do you have the ability to pay for your medications: Yes,  patient has insurance to cover costs of medications    Release of information consent forms completed and in the chart;  Patient's signature needed at discharge.  Patient to Follow up at:  Follow-up Information     Windell Moment, MD. Go on 04/08/2023.   Why: You have an appointment for medication management services on 04/08/23 at 1:00 pm.  This appointment will be held in person. Contact information: [address] [place], [place] [address]  Office: [phone] Fax: [phone]        [facility] Schedule an appointment as soon as possible for a visit.   Why: Please call to schedule an appointment with your provider for therapy services, as we were unable to contact prior to discharge. Contact information: [address] [place] [address] [phone]                 Next level of care provider has access to [facility] [place] [facility] Link:no  Safety Planning and Suicide Prevention discussed: Yes,  completed with patient's mother  on 03/17/23.    Has patient been referred to the Quitline?: Patient does not use tobacco/nicotine products  Patient has been referred for addiction treatment: No known substance use disorder.  [address] [place], [place] 03/20/2023, 9:49 AM

## 2023-03-20 NOTE — Progress Notes (Signed)
D: Pt A & O X 4. Denies SI, HI, AVH and pain at this time. D/C home as ordered. Picked up in lobby by father.  A: D/C instructions reviewed with pt including wound care, staple removal to wounds, electronic prescriptions and follow up appointments; compliance encouraged. All belongings from assigned locker returned to pt at time of departure. Scheduled and PRN medications given with verbal education and effects monitored. Safety checks maintained without incident till time of d/c.  R: Pt receptive to care. Compliant with medications when offered. Denies adverse drug reactions when assessed. Verbalized understanding related to d/c instructions. Signed belonging sheet in agreement with items received from locker. Ambulatory with a steady gait. Appears to be in no physical distress at time of departure.

## 2023-03-31 ENCOUNTER — Ambulatory Visit
Admission: EM | Admit: 2023-03-31 | Discharge: 2023-03-31 | Disposition: A | Discharge status: Home / Self Care | Payer: BC Managed Care – PPO | Attending: Physician Assistant | Admitting: Physician Assistant

## 2023-03-31 DIAGNOSIS — M79632 Pain in left forearm: Secondary | ICD-10-CM

## 2023-03-31 DIAGNOSIS — S41112A Laceration without foreign body of left upper arm, initial encounter: Secondary | ICD-10-CM | POA: Diagnosis not present

## 2023-03-31 DIAGNOSIS — S41112S Laceration without foreign body of left upper arm, sequela: Secondary | ICD-10-CM | POA: Diagnosis not present

## 2023-03-31 MED ORDER — PREDNISONE 20 MG PO TABS
40.0000 mg | ORAL_TABLET | Freq: Every day | ORAL | 0 refills | Status: AC
Start: 2023-03-31 — End: 2023-04-05

## 2023-03-31 NOTE — ED Triage Notes (Signed)
"  I believe I retore something in my left arm again". "Had a breakdown and ended up cutting myself, went to hospital for it, they said I probably tore something in my arm and if it got worse be seen at [facility]". "Yesterday when lifting up something I felt a snap and instant pain". Pain "today" is located on left forearm.

## 2023-03-31 NOTE — ED Triage Notes (Signed)
Note "self cutting injury on left forearm". Some redness and welling remains. Un-repaired by ED.

## 2023-03-31 NOTE — ED Provider Notes (Signed)
[facility]    CSN: [id number] Arrival date & time: 03/31/23  1159      History   Chief Complaint Chief Complaint  Patient presents with   Arm Injury    HPI Vincent Frank is a 21 y.o. male.   Patient here today for evaluation of pain in his left forearm after he was lifting something yesterday and felt a snap and instant pain.  Pain is present just lateral to a laceration that was self-inflicted a few weeks ago.  He was evaluated initially and had sutures placed but have not been taken out.  He has not had any fever or other symptoms.  He denies any numbness or tingling.  Movement makes pain worse.  The history is provided by the patient.  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 No pertinent surgical history.     Home Medications    Prior to Admission medications   Medication Sig Start Date End Date Taking? Authorizing Provider  ARIPiprazole (ABILIFY) 2 MG tablet Take 1 tablet (2 mg total) by mouth at bedtime. Antidepressant augmentation 03/20/23  Yes Armandina Stammer I, NP  FLUoxetine (PROZAC) 40 MG capsule Take 1 capsule (40 mg total) by mouth at bedtime. For depression 03/20/23  Yes  Nwoko, Nicole Kindred I, NP  predniSONE (DELTASONE) 20 MG tablet Take 2 tablets (40 mg total) by mouth daily with breakfast for 5 days. 03/31/23 04/05/23 Yes Tomi Bamberger, PA-C  cephALEXin (KEFLEX) 500 MG capsule Take 1 capsule (500 mg total) by mouth every 8 (eight) hours. For wound infection 03/20/23   Armandina Stammer I, NP  hydrOXYzine (ATARAX) 25 MG tablet Take 1 tablet (25 mg total) by mouth every 6 (six) hours as needed for anxiety. 03/20/23   Armandina Stammer I, NP  ibuprofen (ADVIL) 800 MG tablet Take 1 tablet (800 mg total) by mouth every 6 (six) hours as needed for moderate pain. 03/20/23   Armandina Stammer I, NP  neomycin-bacitracin-polymyxin (NEOSPORIN) OINT Apply 1 Application topically 2 (two) times daily. For wound care 03/20/23   Sanjuana Kava, NP    Family History History reviewed. No pertinent family history.  Social History Social History   Tobacco Use   Smoking status: Never   Smokeless tobacco: Never  Vaping Use   Vaping status: Never Used  Substance Use Topics   Alcohol use: Never   Drug use: Never     Allergies   Bee venom, Hornet venom, and Haldol [haloperidol]   Review of Systems Review of Systems  Constitutional:  Negative for chills and fever.  Eyes:  Negative for discharge and redness.  Musculoskeletal:  Positive for myalgias. Negative for arthralgias.  Skin:  Positive for wound. Negative for color change.  Neurological:  Negative  for numbness.     Physical Exam Triage Vital Signs ED Triage Vitals  Encounter Vitals Group     BP 03/31/23 1229 110/62     Systolic BP Percentile --      Diastolic BP Percentile --      Pulse Rate 03/31/23 1229 73     Resp 03/31/23 1229 16     Temp 03/31/23 1229 97.6 F (36.4 C)     Temp Source 03/31/23 1229 Oral     SpO2 03/31/23 1229 96 %     Weight 03/31/23 1226 225 lb (102.1 kg)     Height 03/31/23 1226 6\' 3"  (1.905 m)     Head Circumference --      Peak Flow --      Pain Score 03/31/23 1226 8     Pain Loc --      Pain  Education --      Exclude from Growth Chart --    No data found.  Updated Vital Signs BP 110/62 (BP Location: Right Arm)   Pulse 73   Temp 97.6 F (36.4 C) (Oral)   Resp 16   Ht 6\' 3"  (1.905 m)   Wt 225 lb (102.1 kg)   SpO2 96%   BMI 28.12 kg/m      Physical Exam Vitals and nursing note reviewed.  Constitutional:      General: He is not in acute distress.    Appearance: Normal appearance. He is not ill-appearing.  HENT:     Head: Normocephalic and atraumatic.  Eyes:     Conjunctiva/sclera: Conjunctivae normal.  Cardiovascular:     Rate and Rhythm: Normal rate.  Pulmonary:     Effort: Pulmonary effort is normal. No respiratory distress.  Musculoskeletal:     Comments: Full range of motion of left wrist with pain to the left forearm with movement.  Skin:    Comments: Approximately 4 cm open laceration to left forearm without active bleeding or drainage, no surrounding erythema.  Neurological:     Mental Status: He is alert.  Psychiatric:        Mood and Affect: Mood normal.        Behavior: Behavior normal.        Thought Content: Thought content normal.      UC Treatments / Results  Labs (all labs ordered are listed, but only abnormal results are displayed) Labs Reviewed - No data to display  EKG   Radiology No results found.  Procedures Procedures (including critical care time)  Medications Ordered in UC Medications - No data to display  Initial Impression / Assessment and Plan / UC Course  I have reviewed the triage vital signs and the nursing notes.  Pertinent labs & imaging results that were available during my care of the patient were reviewed by me and considered in my medical decision making (see chart for details).    Will trial steroid for possible inflammatory cause of pain but recommended follow-up with Ortho should symptoms fail to improve or worsen.  No clear sign of infection at this time.  Wound dressed in office.  Patient expresses  understanding of plan.  Final Clinical Impressions(s) / UC Diagnoses   Final diagnoses:  Arm laceration, left, sequela  Left forearm pain   Discharge Instructions   None    ED Prescriptions     Medication Sig Dispense Auth. Provider   predniSONE (DELTASONE) 20 MG tablet Take 2 tablets (40 mg total) by mouth daily with breakfast  for 5 days. 10 tablet Tomi Bamberger, PA-C      PDMP not reviewed this encounter.   Tomi Bamberger, PA-C 03/31/23 1427

## 2023-04-06 ENCOUNTER — Emergency Department (HOSPITAL_COMMUNITY)
Admission: EM | Admit: 2023-04-06 | Discharge: 2023-04-07 | Disposition: A | Discharge status: Home / Self Care | Payer: BC Managed Care – PPO | Attending: Emergency Medicine | Admitting: Emergency Medicine

## 2023-04-06 ENCOUNTER — Other Ambulatory Visit: Payer: Self-pay

## 2023-04-06 ENCOUNTER — Inpatient Hospital Stay: Admission: AD | Admit: 2023-04-06 | Payer: BC Managed Care – PPO | Source: Intra-hospital | Admitting: Psychiatry

## 2023-04-06 ENCOUNTER — Emergency Department (HOSPITAL_COMMUNITY): Payer: BC Managed Care – PPO

## 2023-04-06 DIAGNOSIS — S41112A Laceration without foreign body of left upper arm, initial encounter: Secondary | ICD-10-CM | POA: Diagnosis not present

## 2023-04-06 DIAGNOSIS — X789XXA Intentional self-harm by unspecified sharp object, initial encounter: Secondary | ICD-10-CM | POA: Diagnosis not present

## 2023-04-06 DIAGNOSIS — Z23 Encounter for immunization: Secondary | ICD-10-CM | POA: Insufficient documentation

## 2023-04-06 DIAGNOSIS — F3341 Major depressive disorder, recurrent, in partial remission: Secondary | ICD-10-CM | POA: Diagnosis present

## 2023-04-06 DIAGNOSIS — F332 Major depressive disorder, recurrent severe without psychotic features: Secondary | ICD-10-CM | POA: Insufficient documentation

## 2023-04-06 DIAGNOSIS — F32A Depression, unspecified: Secondary | ICD-10-CM

## 2023-04-06 DIAGNOSIS — W19XXXA Unspecified fall, initial encounter: Secondary | ICD-10-CM

## 2023-04-06 DIAGNOSIS — R339 Retention of urine, unspecified: Secondary | ICD-10-CM

## 2023-04-06 LAB — COMPREHENSIVE METABOLIC PANEL
ALT: 26 U/L (ref 0–44)
AST: 27 U/L (ref 15–41)
Albumin: 4.2 g/dL (ref 3.5–5.0)
Alkaline Phosphatase: 74 U/L (ref 38–126)
Anion gap: 11 (ref 5–15)
BUN: 13 mg/dL (ref 6–20)
CO2: 21 mmol/L — ABNORMAL LOW (ref 22–32)
Calcium: 9.4 mg/dL (ref 8.9–10.3)
Chloride: 104 mmol/L (ref 98–111)
Creatinine, Ser: 1.06 mg/dL (ref 0.61–1.24)
GFR, Estimated: >60 mL/min (ref >60)
Glucose, Bld: 104 mg/dL — ABNORMAL HIGH (ref 70–99)
Potassium: 3.9 mmol/L (ref 3.5–5.1)
Sodium: 136 mmol/L (ref 135–145)
Total Bilirubin: 0.3 mg/dL (ref 0.3–1.2)
Total Protein: 7 g/dL (ref 6.5–8.1)

## 2023-04-06 LAB — URINALYSIS, ROUTINE W REFLEX MICROSCOPIC
Bilirubin Urine: NEGATIVE
Glucose, UA: NEGATIVE mg/dL
Hgb urine dipstick: NEGATIVE
Ketones, ur: NEGATIVE mg/dL
Leukocytes,Ua: NEGATIVE
Nitrite: NEGATIVE
Protein, ur: NEGATIVE mg/dL
Specific Gravity, Urine: 1.01 (ref 1.005–1.030)
pH: 6.5 (ref 5.0–8.0)

## 2023-04-06 LAB — I-STAT CHEM 8, ED
BUN: 14 mg/dL (ref 6–20)
Calcium, Ion: 1.26 mmol/L (ref 1.15–1.40)
Chloride: 105 mmol/L (ref 98–111)
Creatinine, Ser: 1 mg/dL (ref 0.61–1.24)
Glucose, Bld: 106 mg/dL — ABNORMAL HIGH (ref 70–99)
HCT: 38 % — ABNORMAL LOW (ref 39.0–52.0)
Hemoglobin: 12.9 g/dL — ABNORMAL LOW (ref 13.0–17.0)
Potassium: 3.9 mmol/L (ref 3.5–5.1)
Sodium: 140 mmol/L (ref 135–145)
TCO2: 22 mmol/L (ref 22–32)

## 2023-04-06 LAB — CBC
HCT: 38.1 % — ABNORMAL LOW (ref 39.0–52.0)
Hemoglobin: 12.8 g/dL — ABNORMAL LOW (ref 13.0–17.0)
MCH: 26.6 pg (ref 26.0–34.0)
MCHC: 33.6 g/dL (ref 30.0–36.0)
MCV: 79 fL — ABNORMAL LOW (ref 80.0–100.0)
Platelets: 255 10*3/uL (ref 150–400)
RBC: 4.82 MIL/uL (ref 4.22–5.81)
RDW: 12.6 % (ref 11.5–15.5)
WBC: 12.5 10*3/uL — ABNORMAL HIGH (ref 4.0–10.5)
nRBC: 0 % (ref 0.0–0.2)

## 2023-04-06 LAB — RAPID URINE DRUG SCREEN, HOSP PERFORMED
Amphetamines: NOT DETECTED
Barbiturates: NOT DETECTED
Benzodiazepines: NOT DETECTED
Cocaine: NOT DETECTED
Opiates: NOT DETECTED
Tetrahydrocannabinol: NOT DETECTED

## 2023-04-06 LAB — SAMPLE TO BLOOD BANK

## 2023-04-06 LAB — PROTIME-INR
INR: 1.1 (ref 0.8–1.2)
Prothrombin Time: 14 s (ref 11.4–15.2)

## 2023-04-06 LAB — I-STAT CG4 LACTIC ACID, ED: Lactic Acid, Venous: 1.9 mmol/L (ref 0.5–1.9)

## 2023-04-06 LAB — ETHANOL: Alcohol, Ethyl (B): <10 mg/dL (ref <10)

## 2023-04-06 MED ORDER — LACTATED RINGERS IV BOLUS
1000.0000 mL | Freq: Once | INTRAVENOUS | Status: AC
  Administered 2023-04-06: 1000 mL via INTRAVENOUS

## 2023-04-06 MED ORDER — ARIPIPRAZOLE 5 MG PO TABS
5.0000 mg | ORAL_TABLET | Freq: Every day | ORAL | Status: DC
  Administered 2023-04-06: 5 mg via ORAL
  Filled 2023-04-06: qty 1

## 2023-04-06 MED ORDER — LIDOCAINE-EPINEPHRINE (PF) 2 %-1:200000 IJ SOLN: 10.0000 mL | Freq: Once | INTRAMUSCULAR | Status: AC

## 2023-04-06 MED ORDER — FLUOXETINE HCL 20 MG PO CAPS: 40.0000 mg | ORAL_CAPSULE | Freq: Every day | ORAL | Status: DC

## 2023-04-06 MED ORDER — ARIPIPRAZOLE 2 MG PO TABS: 2.0000 mg | ORAL_TABLET | Freq: Every day | ORAL | Status: DC

## 2023-04-06 MED ORDER — GABAPENTIN 100 MG PO CAPS: 100.0000 mg | ORAL_CAPSULE | Freq: Three times a day (TID) | ORAL | Status: DC | PRN

## 2023-04-06 MED ORDER — CEPHALEXIN 500 MG PO CAPS: 500.0000 mg | ORAL_CAPSULE | Freq: Four times a day (QID) | ORAL | 0 refills | Status: DC

## 2023-04-06 MED ORDER — TETANUS-DIPHTH-ACELL PERTUSSIS 5-2.5-18.5 LF-MCG/0.5 IM SUSY
0.5000 mL | PREFILLED_SYRINGE | Freq: Once | INTRAMUSCULAR | Status: AC
  Administered 2023-04-06: 0.5 mL via INTRAMUSCULAR
  Filled 2023-04-06: qty 0.5

## 2023-04-06 MED ORDER — LIDOCAINE-EPINEPHRINE (PF) 2 %-1:200000 IJ SOLN
INTRAMUSCULAR | Status: AC
  Administered 2023-04-06: 10 mL via INTRADERMAL
  Filled 2023-04-06: qty 20

## 2023-04-06 MED ORDER — HYDROXYZINE HCL 25 MG PO TABS: 25.0000 mg | ORAL_TABLET | Freq: Four times a day (QID) | ORAL | Status: DC | PRN

## 2023-04-06 MED ORDER — CEFAZOLIN SODIUM-DEXTROSE 2-4 GM/100ML-% IV SOLN
2.0000 g | Freq: Once | INTRAVENOUS | Status: AC
  Administered 2023-04-06: 2 g via INTRAVENOUS
  Filled 2023-04-06: qty 100

## 2023-04-06 MED ORDER — IOHEXOL 350 MG/ML SOLN
200.0000 mL | Freq: Once | INTRAVENOUS | Status: AC | PRN
  Administered 2023-04-06: 200 mL via INTRAVENOUS

## 2023-04-06 MED ORDER — FLUOXETINE HCL 20 MG PO CAPS
40.0000 mg | ORAL_CAPSULE | Freq: Every day | ORAL | Status: DC
  Administered 2023-04-06: 40 mg via ORAL
  Filled 2023-04-06: qty 2

## 2023-04-06 MED ORDER — BACITRACIN-NEOMYCIN-POLYMYXIN OINTMENT TUBE: 1.0000 | TOPICAL_OINTMENT | Freq: Two times a day (BID) | CUTANEOUS | Status: DC
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
## 2023-04-06 NOTE — ED Provider Notes (Signed)
Pt seen by psychiatry.  Recommendation was for IVC and admission   Linwood Dibbles, MD 04/06/23 [phone]

## 2023-04-06 NOTE — ED Notes (Signed)
Pt states no suicidal thoughts at this time. Will still talk to TTS
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
## 2023-04-06 NOTE — ED Notes (Signed)
Pt attempting to provide urine specimen

## 2023-04-06 NOTE — ED Notes (Signed)
Pt unable to provide urine specimen. Pt having difficulties. Notified Lockwood MD about concerns and that a bladder scan is being obtained.

## 2023-04-06 NOTE — ED Notes (Signed)
Pt bladder scanned and noted. Notified Lockwood MD. Pt tender to palpation to lower abd.

## 2023-04-06 NOTE — ED Notes (Signed)
Per pt, pt doesn't want his family to be updated and/or given information, but is ok for them to visit.

## 2023-04-06 NOTE — Progress Notes (Addendum)
Pt was accepted to [facility] TODAY 04/06/2023; Bed Assignment 302 Address: [address] [place], [place], [place] [address]  IVC paperwork is scanned into the chart.  Pt meets inpatient criteria per Madelin Rear  Attending Physician will be Dr. Shellee Milo  Report can be called to: [phone]   Pt can arrive after: [facility] and Charge BM[facility] RN will coordinate with care team.  Care Team notified: Day [facility] Rona Ravens, RN, [facility] Hendra,LCSW, Demetria Tamela Oddi Dixon,RN,Esther Bolton, [place] 04/06/2023 @ 4:31 PM

## 2023-04-06 NOTE — ED Provider Notes (Signed)
11:57 AM Patient is having difficulty with urination.  He notes that he has had similar issues during prior stressful periods, requiring In-N-Out cath.  Bedside ultrasound performed by nursing staff with greater than 800 mL of fluid.  Patient otherwise appears in no distress, CT with trace fluid, bladder unremarkable per radiology.  Patient will have In-N-Out cath.   Gerhard Munch, MD 04/06/23 [phone]
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
## 2023-04-06 NOTE — ED Notes (Signed)
Pt reports he had a similar experience w/ not being able to urinate when he cut his arm last time. Pt reports he had to have an in and out cath then. Per Jeraldine Loots MD, perform in and out cath.

## 2023-04-06 NOTE — ED Notes (Signed)
Findings and Order e-filed and received by [employer] Currently waiting on email from [place] (due to time of day), showing acceptance of this last filing. [facility] faxed, copies made for medical records and clipboard, docs taken to orange zone and original filed in Personal assistant.

## 2023-04-06 NOTE — ED Notes (Signed)
Pt unable to urinate, bladder scan performed and results relayed to EDP and [facility] team.

## 2023-04-07 ENCOUNTER — Inpatient Hospital Stay
Admission: AD | Admit: 2023-04-07 | Discharge: 2023-04-11 | DRG: 881 | Disposition: A | Discharge status: Other Acute Inpatient Hospital | Payer: BC Managed Care – PPO | Source: Intra-hospital | Attending: Psychiatry | Admitting: Psychiatry

## 2023-04-07 ENCOUNTER — Encounter (HOSPITAL_COMMUNITY): Payer: Self-pay | Admitting: Psychiatry

## 2023-04-07 ENCOUNTER — Emergency Department (HOSPITAL_COMMUNITY): Payer: BC Managed Care – PPO

## 2023-04-07 DIAGNOSIS — K5900 Constipation, unspecified: Secondary | ICD-10-CM

## 2023-04-07 DIAGNOSIS — Z560 Unemployment, unspecified: Secondary | ICD-10-CM | POA: Diagnosis not present

## 2023-04-07 DIAGNOSIS — D509 Iron deficiency anemia, unspecified: Secondary | ICD-10-CM | POA: Diagnosis present

## 2023-04-07 DIAGNOSIS — D508 Other iron deficiency anemias: Secondary | ICD-10-CM | POA: Diagnosis not present

## 2023-04-07 DIAGNOSIS — R338 Other retention of urine: Principal | ICD-10-CM | POA: Diagnosis present

## 2023-04-07 DIAGNOSIS — R4588 Nonsuicidal self-harm: Secondary | ICD-10-CM | POA: Diagnosis not present

## 2023-04-07 DIAGNOSIS — K5909 Other constipation: Secondary | ICD-10-CM | POA: Diagnosis not present

## 2023-04-07 DIAGNOSIS — T50905D Adverse effect of unspecified drugs, medicaments and biological substances, subsequent encounter: Secondary | ICD-10-CM | POA: Diagnosis not present

## 2023-04-07 DIAGNOSIS — Z91030 Bee allergy status: Secondary | ICD-10-CM

## 2023-04-07 DIAGNOSIS — Z818 Family history of other mental and behavioral disorders: Secondary | ICD-10-CM | POA: Diagnosis not present

## 2023-04-07 DIAGNOSIS — Z888 Allergy status to other drugs, medicaments and biological substances status: Secondary | ICD-10-CM | POA: Diagnosis not present

## 2023-04-07 DIAGNOSIS — Y9355 Activity, bike riding: Secondary | ICD-10-CM

## 2023-04-07 DIAGNOSIS — X789XXA Intentional self-harm by unspecified sharp object, initial encounter: Secondary | ICD-10-CM | POA: Diagnosis not present

## 2023-04-07 DIAGNOSIS — D649 Anemia, unspecified: Secondary | ICD-10-CM | POA: Insufficient documentation

## 2023-04-07 DIAGNOSIS — F329 Major depressive disorder, single episode, unspecified: Secondary | ICD-10-CM | POA: Diagnosis present

## 2023-04-07 DIAGNOSIS — Z7289 Other problems related to lifestyle: Secondary | ICD-10-CM | POA: Diagnosis not present

## 2023-04-07 DIAGNOSIS — R4587 Impulsiveness: Secondary | ICD-10-CM | POA: Diagnosis present

## 2023-04-07 DIAGNOSIS — R339 Retention of urine, unspecified: Secondary | ICD-10-CM | POA: Diagnosis present

## 2023-04-07 DIAGNOSIS — S41112D Laceration without foreign body of left upper arm, subsequent encounter: Secondary | ICD-10-CM | POA: Diagnosis not present

## 2023-04-07 DIAGNOSIS — Z79899 Other long term (current) drug therapy: Secondary | ICD-10-CM | POA: Diagnosis not present

## 2023-04-07 DIAGNOSIS — F332 Major depressive disorder, recurrent severe without psychotic features: Secondary | ICD-10-CM | POA: Diagnosis not present

## 2023-04-07 DIAGNOSIS — S41119A Laceration without foreign body of unspecified upper arm, initial encounter: Secondary | ICD-10-CM | POA: Diagnosis not present

## 2023-04-07 DIAGNOSIS — T50905S Adverse effect of unspecified drugs, medicaments and biological substances, sequela: Secondary | ICD-10-CM | POA: Diagnosis not present

## 2023-04-07 DIAGNOSIS — F603 Borderline personality disorder: Secondary | ICD-10-CM | POA: Diagnosis present

## 2023-04-07 DIAGNOSIS — T50905A Adverse effect of unspecified drugs, medicaments and biological substances, initial encounter: Secondary | ICD-10-CM | POA: Diagnosis not present

## 2023-04-07 DIAGNOSIS — F3341 Major depressive disorder, recurrent, in partial remission: Secondary | ICD-10-CM | POA: Diagnosis not present

## 2023-04-07 DIAGNOSIS — T7840XA Allergy, unspecified, initial encounter: Secondary | ICD-10-CM | POA: Diagnosis not present

## 2023-04-07 DIAGNOSIS — F339 Major depressive disorder, recurrent, unspecified: Secondary | ICD-10-CM | POA: Diagnosis not present

## 2023-04-07 DIAGNOSIS — J4550 Severe persistent asthma, uncomplicated: Secondary | ICD-10-CM | POA: Diagnosis not present

## 2023-04-07 DIAGNOSIS — Z9151 Personal history of suicidal behavior: Secondary | ICD-10-CM

## 2023-04-07 MED ORDER — TRAZODONE HCL 50 MG PO TABS
50.0000 mg | ORAL_TABLET | Freq: Every evening | ORAL | Status: DC | PRN
  Administered 2023-04-09 – 2023-04-10 (×2): 50 mg via ORAL
  Filled 2023-04-07 (×4): qty 1

## 2023-04-07 MED ORDER — GABAPENTIN 100 MG PO CAPS
100.0000 mg | ORAL_CAPSULE | Freq: Three times a day (TID) | ORAL | Status: DC | PRN
  Administered 2023-04-11: 100 mg via ORAL
  Filled 2023-04-07: qty 1

## 2023-04-07 MED ORDER — ACETAMINOPHEN 325 MG PO TABS: 650.0000 mg | ORAL_TABLET | Freq: Four times a day (QID) | ORAL | Status: DC | PRN

## 2023-04-07 MED ORDER — FLUOXETINE HCL 20 MG PO CAPS
40.0000 mg | ORAL_CAPSULE | Freq: Every day | ORAL | Status: DC
  Administered 2023-04-07 – 2023-04-10 (×4): 40 mg via ORAL
  Filled 2023-04-07 (×6): qty 2

## 2023-04-07 MED ORDER — LORAZEPAM 2 MG/ML IJ SOLN
2.0000 mg | Freq: Three times a day (TID) | INTRAMUSCULAR | Status: DC | PRN
  Administered 2023-04-10: 2 mg via INTRAMUSCULAR
  Filled 2023-04-07: qty 1

## 2023-04-07 MED ORDER — LORAZEPAM 2 MG PO TABS
2.0000 mg | ORAL_TABLET | Freq: Three times a day (TID) | ORAL | Status: DC | PRN
  Administered 2023-04-09: 2 mg via ORAL
  Filled 2023-04-07: qty 1

## 2023-04-07 MED ORDER — IOHEXOL 350 MG/ML SOLN
75.0000 mL | Freq: Once | INTRAVENOUS | Status: AC | PRN
  Administered 2023-04-07: 75 mL via INTRAVENOUS

## 2023-04-07 MED ORDER — DIPHENHYDRAMINE HCL 50 MG/ML IJ SOLN: 50.0000 mg | Freq: Three times a day (TID) | INTRAMUSCULAR | Status: DC | PRN

## 2023-04-07 MED ORDER — DIPHENHYDRAMINE HCL 25 MG PO CAPS: 50.0000 mg | ORAL_CAPSULE | Freq: Three times a day (TID) | ORAL | Status: DC | PRN

## 2023-04-07 MED ORDER — ALUM & MAG HYDROXIDE-SIMETH 200-200-20 MG/5ML PO SUSP: 30.0000 mL | ORAL | Status: DC | PRN

## 2023-04-07 MED ORDER — ARIPIPRAZOLE 5 MG PO TABS
5.0000 mg | ORAL_TABLET | Freq: Every day | ORAL | Status: DC
  Administered 2023-04-07 – 2023-04-10 (×4): 5 mg via ORAL
  Filled 2023-04-07 (×5): qty 1

## 2023-04-07 MED ORDER — MAGNESIUM HYDROXIDE 400 MG/5ML PO SUSP: 30.0000 mL | Freq: Every day | ORAL | Status: DC | PRN

## 2023-04-07 NOTE — ED Notes (Signed)
Called CT and notified of pt readiness for CT scan

## 2023-04-07 NOTE — Group Note (Signed)
Date:  04/07/2023 Time:  8:34 PM  Group Topic/Focus:  Goals Group:   The focus of this group is to help patients establish daily goals to achieve during treatment and discuss how the patient can incorporate goal setting into their daily lives to aide in recovery.    Participation Level:  Minimal  Participation Quality:  Appropriate and Attentive  Affect:  Appropriate  Cognitive:  Alert and Appropriate  Insight: Good  Engagement in Group:  Limited  Modes of Intervention:  Discussion, Rapport Building, Socialization, and Support  Additional Comments:     Jamarion [place] 04/07/2023, 8:34 PM
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
## 2023-04-07 NOTE — ED Notes (Signed)
Dressing on L arm changed. Stitches to wounds look good, no indication of infection noted. Telfa and tape used to cover wound.

## 2023-04-07 NOTE — ED Notes (Signed)
Notified Diplomatic Services operational officer of need for Sheriff to be called to take pt to [facility]
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
## 2023-04-07 NOTE — ED Notes (Signed)
Per report, pt had to have an in and out cath last night d/t bladder scan showed in his bladder and pt was in pain and abd distended. Staff obtained from the in and out cath. This RN has expressed concerns regarding this pt's urinary status x 2. EDP notified this morning of same. BH AC included in chat since pt is supposed to go to [facility] today.
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
## 2023-04-07 NOTE — ED Notes (Signed)
Spoke w/ [facility] RN and was told that she would call me back.

## 2023-04-08 DIAGNOSIS — F3341 Major depressive disorder, recurrent, in partial remission: Secondary | ICD-10-CM
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
## 2023-04-08 NOTE — Group Note (Signed)
Date:  04/08/2023 Time:  10:29 PM  Group Topic/Focus:  Overcoming Stress:   The focus of this group is to define stress and help patients assess their triggers.    Participation Level:  Active  Participation Quality:  Appropriate and Attentive  Affect:  Appropriate  Cognitive:  Alert and Appropriate  Insight: Appropriate  Engagement in Group:  Developing/Improving  Modes of Intervention:  Education and Limit-setting  Additional Comments:     Danil Wedge 04/08/2023, 10:29 PM

## 2023-04-08 NOTE — [facility] Suicide Risk Assessment (Signed)
[facility] INPATIENT:  Family/Significant Other Suicide Prevention Education  Suicide Prevention Education:  Contact Attempts: Suezanne Jacquet, mom, [phone] has been identified by the patient as the family member/significant other with whom the patient will be residing, and identified as the person(s) who will aid the patient in the event of a mental health crisis.  With written consent from the patient, two attempts were made to provide suicide prevention education, prior to and/or following the patient's discharge.  We were unsuccessful in providing suicide prevention education.  A suicide education pamphlet was given to the patient to share with family/significant other.  Date and time of first attempt: 04/08/2023 4:11PM Date and time of second attempt:  Second attempt is needed   CSW left HIPAA compliant voicemail requesting a return phone call.  Harden Mo 04/08/2023, 4:11 PM

## 2023-04-08 NOTE — Plan of Care (Signed)
  Problem: Education: Goal: Knowledge of General Education information will improve Description: Including pain rating scale, medication(s)/side effects and non-pharmacologic comfort measures Outcome: Not Progressing   Problem: Health Behavior/Discharge Planning: Goal: Ability to manage health-related needs will improve Outcome: Not Progressing   Problem: Clinical Measurements: Goal: Ability to maintain clinical measurements within normal limits will improve Outcome: Not Progressing Goal: Will remain free from infection Outcome: Not Progressing Goal: Diagnostic test results will improve Outcome: Not Progressing   Problem: Activity: Goal: Risk for activity intolerance will decrease Outcome: Not Progressing   Problem: Nutrition: Goal: Adequate nutrition will be maintained Outcome: Not Progressing   Problem: Coping: Goal: Level of anxiety will decrease Outcome: Not Progressing   Problem: Elimination: Goal: Will not experience complications related to bowel motility Outcome: Not Progressing Goal: Will not experience complications related to urinary retention Outcome: Not Progressing   Problem: Pain Managment: Goal: General experience of comfort will improve Outcome: Not Progressing   Problem: Safety: Goal: Ability to remain free from injury will improve Outcome: Not Progressing   Problem: Skin Integrity: Goal: Risk for impaired skin integrity will decrease Outcome: Not Progressing   Problem: Education: Goal: Knowledge of [place] General Education information/materials will improve Outcome: Not Progressing Goal: Emotional status will improve Outcome: Not Progressing Goal: Mental status will improve Outcome: Not Progressing Goal: Verbalization of understanding the information provided will improve Outcome: Not Progressing   Problem: Activity: Goal: Interest or engagement in activities will improve Outcome: Not Progressing Goal: Sleeping patterns will  improve Outcome: Not Progressing   Problem: Coping: Goal: Ability to verbalize frustrations and anger appropriately will improve Outcome: Not Progressing Goal: Ability to demonstrate self-control will improve Outcome: Not Progressing   Problem: Health Behavior/Discharge Planning: Goal: Identification of resources available to assist in meeting health care needs will improve Outcome: Not Progressing Goal: Compliance with treatment plan for underlying cause of condition will improve Outcome: Not Progressing   Problem: Physical Regulation: Goal: Ability to maintain clinical measurements within normal limits will improve Outcome: Not Progressing   Problem: Safety: Goal: Periods of time without injury will increase Outcome: Not Progressing

## 2023-04-08 NOTE — Plan of Care (Signed)
  Problem: Coping: Goal: Level of anxiety will decrease Outcome: Progressing   Problem: Safety: Goal: Ability to remain free from injury will improve Outcome: Progressing   Problem: Education: Goal: Mental status will improve Outcome: Progressing   Problem: Coping: Goal: Ability to verbalize frustrations and anger appropriately will improve Outcome: Progressing   Problem: Safety: Goal: Periods of time without injury will increase Outcome: Progressing

## 2023-04-08 NOTE — Group Note (Signed)
Date:  04/08/2023 Time:  10:37 AM  Group Topic/Focus:  Activity Group:  Focus of the group was to encourage activities such as coloring, playing cards or just having conversations for the benefit of mental health treatment.    Participation Level:  Did Not Attend   Mary Sella Gaby Harney 04/08/2023, 10:37 AM

## 2023-04-08 NOTE — Group Note (Signed)
Date:  04/08/2023 Time:  5:18 PM  Group Topic/Focus:  Self Care:   The focus of this group is to help patients understand the importance of self-care in order to improve or restore emotional, physical, spiritual, interpersonal, and financial health.    Participation Level:  Did Not Attend  Participation Quality:    Affect:    Cognitive:    Insight:   Engagement in Group:    Modes of Intervention:    Additional Comments:    Ciin Brazzel 04/08/2023, 5:18 PM

## 2023-04-08 NOTE — Progress Notes (Signed)
D- Patient alert and oriented x 4. Affect flat/mood congruent. Denies SI/ HI/ AVH. Patient denies pain. Patient endorses depression and anxiety. He complains of inability to void and I & O cath ordered. Performed with 175 cc output straw colored urine. Repeated 8 hours later and scant urine output obtained. He states he drank 4 8 oz cups of fluid. Bladder scan to follow in the morning. No other urinary complaints. A- Scheduled medications administered to patient, per MD orders. Support and encouragement provided.  Routine safety checks conducted every 15 minutes without incident.  Patient informed to notify staff with problems or concerns and verbalizes understanding. R- No adverse drug reactions noted.  Patient compliant with medications and treatment plan. Patient receptive, calm cooperative and interacts well with others on the unit.  Patient contracts for safety and  remains safe on the unit at this time.

## 2023-04-08 NOTE — Group Note (Signed)
[facility] LCSW Group Therapy Note   Group Date: 04/08/2023 Start Time: 1315 End Time: 1415   Type of Therapy/Topic:  Group Therapy:  Balance in Life  Participation Level:  Did Not Attend   Description of Group:    This group will address the concept of balance and how it feels and looks when one is unbalanced. Patients will be encouraged to process areas in their lives that are out of balance, and identify reasons for remaining unbalanced. Facilitators will guide patients utilizing problem- solving interventions to address and correct the stressor making their life unbalanced. Understanding and applying boundaries will be explored and addressed for obtaining  and maintaining a balanced life. Patients will be encouraged to explore ways to assertively make their unbalanced needs known to significant others in their lives, using other group members and facilitator for support and feedback.  Therapeutic Goals: Patient will identify two or more emotions or situations they have that consume much of in their lives. Patient will identify signs/triggers that life has become out of balance:  Patient will identify two ways to set boundaries in order to achieve balance in their lives:  Patient will demonstrate ability to communicate their needs through discussion and/or role plays  Summary of Patient Progress: X   Therapeutic Modalities:   Cognitive Behavioral Therapy Solution-Focused Therapy Assertiveness Training   Glenis Smoker, LCSW
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
## 2023-04-08 NOTE — Group Note (Signed)
Recreation Therapy Group Note   Group Topic:Relaxation  Group Date: 04/08/2023 Start Time: 1000 End Time: 1050 Facilitators: Rosina Lowenstein, LRT, CTRS Location:  Craft Room  Group Description: PMR (Progressive Muscle Relaxation). LRT asks patients their current level of stress/anxiety from 1-10, with 10 being the highest. LRT educates patients on what PMR is and the benefits that come from it. Patients are asked to sit with their feet flat on the floor while sitting up and all the way back in their chair, if possible. LRT and pts follow a prompt through a speaker that requires you to tense and release different muscles in their body and focus on their breathing. During session, lights are off and soft music is being played. Pts are given a stress ball to use if needed. At the end of the prompt, LRT asks patients to rank their current levels of stress/anxiety from 1-10, 10 being the highest.   Goal Area(s) Addressed:  Patients will be able to describe progressive muscle relaxation.  Patient will practice using relaxation technique. Patient will identify a new coping skill.  Patient will follow multistep directions to reduce anxiety and stress.   Affect/Mood: N/A   Participation Level: Did not attend    Clinical Observations/Individualized Feedback: Vincent Frank did not attend group.  Plan: Continue to engage patient in RT group sessions 2-3x/week.   Rosina Lowenstein, LRT, CTRS 04/08/2023 11:00 AM

## 2023-04-08 NOTE — Progress Notes (Signed)
Isolative to self. Noted in dayroom watching television, no interaction with peers, minimal with staff. Denies SI, HI, AVH. Medications given as prescribed. Pt remains safe with q 15 min checks.
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
## 2023-04-09 DIAGNOSIS — F3341 Major depressive disorder, recurrent, in partial remission: Secondary | ICD-10-CM | POA: Diagnosis not present

## 2023-04-09 DIAGNOSIS — S41112D Laceration without foreign body of left upper arm, subsequent encounter: Secondary | ICD-10-CM

## 2023-04-09 MED ORDER — CEPHALEXIN 500 MG PO CAPS
500.0000 mg | ORAL_CAPSULE | Freq: Four times a day (QID) | ORAL | Status: DC
  Administered 2023-04-09 – 2023-04-11 (×7): 500 mg via ORAL
  Filled 2023-04-09 (×7): qty 1

## 2023-04-09 MED ORDER — LIDOCAINE-EPINEPHRINE 2 %-1:100000 IJ SOLN
20.0000 mL | INTRAMUSCULAR | Status: DC | PRN
  Filled 2023-04-09: qty 1

## 2023-04-09 MED ORDER — OXYCODONE-ACETAMINOPHEN 5-325 MG PO TABS
1.0000 | ORAL_TABLET | Freq: Four times a day (QID) | ORAL | Status: AC | PRN
  Administered 2023-04-09 – 2023-04-10 (×2): 1 via ORAL
  Filled 2023-04-09 (×2): qty 1

## 2023-04-09 NOTE — Progress Notes (Signed)
[facility] MD Progress Note  04/09/2023 12:58 PM Vincent Frank  MRN:  [id number] Subjective: Vincent Frank seen on rounds.  He is mood and affect are euthymic.  He still states he is having trouble urinating.  The nurse tells me that she has been catheterizing him.  He says the medications have been helpful and he has no thoughts of hurting himself.  No side effects.  No other issues. Principal Problem: MDD (major depressive disorder) Diagnosis: Principal Problem:   MDD (major depressive disorder)  Total Time spent with patient: 15 minutes  Past Psychiatric History: Depression  Past Medical History:  Past Medical History:  Diagnosis Date   Major depressive disorder    No past surgical history on file. Family History: No family history on file. Family Psychiatric  History: Brother with bipolar disorder Social History:  Social History   Substance and Sexual Activity  Alcohol Use Never     Social History   Substance and Sexual Activity  Drug Use Never    Social History   Socioeconomic History   Marital status: Single    Spouse name: Not on file   Number of children: Not on file   Years of education: Not on file   Highest education level: Not on file  Occupational History   Not on file  Tobacco Use   Smoking status: Never   Smokeless tobacco: Never  Vaping Use   Vaping status: Never Used  Substance and Sexual Activity   Alcohol use: Never   Drug use: Never   Sexual activity: Not Currently  Other Topics Concern   Not on file  Social History Narrative   Not on file   Social Determinants of Health   Financial Resource Strain: Not on file  Food Insecurity: No Food Insecurity (03/16/2023)   Hunger Vital Sign    Worried About Running Out of Food in the Last Year: Never true    Ran Out of Food in the Last Year: Never true  Transportation Needs: No Transportation Needs (03/16/2023)   PRAPARE - Administrator, Civil Service (Medical): No    Lack of  Transportation (Non-Medical): No  Physical Activity: Not on file  Stress: Not on file  Social Connections: Unknown (01/16/2023)   Received from [facility]   Social Network    Social Network: Not on file   Additional Social History:                         Sleep: Good  Appetite:  Good  Current Medications: Current Facility-Administered Medications  Medication Dose Route Frequency Provider Last Rate Last Admin   acetaminophen (TYLENOL) tablet 650 mg  650 mg Oral Q6H PRN Eligha Bridegroom, NP       alum & mag hydroxide-simeth (MAALOX/MYLANTA) 200-200-20 MG/5ML suspension 30 mL  30 mL Oral Q4H PRN Eligha Bridegroom, NP       ARIPiprazole (ABILIFY) tablet 5 mg  5 mg Oral QHS Eligha Bridegroom, NP   5 mg at 04/08/23 2117   diphenhydrAMINE (BENADRYL) capsule 50 mg  50 mg Oral TID PRN Eligha Bridegroom, NP       Or   diphenhydrAMINE (BENADRYL) injection 50 mg  50 mg Intramuscular TID PRN Eligha Bridegroom, NP       FLUoxetine (PROZAC) capsule 40 mg  40 mg Oral QHS Eligha Bridegroom, NP   40 mg at 04/08/23 2117   gabapentin (NEURONTIN) capsule 100 mg  100 mg Oral TID PRN Effie Shy,  Mikaela, NP       LORazepam (ATIVAN) tablet 2 mg  2 mg Oral TID PRN Eligha Bridegroom, NP       Or   LORazepam (ATIVAN) injection 2 mg  2 mg Intramuscular TID PRN Eligha Bridegroom, NP       magnesium hydroxide (MILK OF MAGNESIA) suspension 30 mL  30 mL Oral Daily PRN Eligha Bridegroom, NP       traZODone (DESYREL) tablet 50 mg  50 mg Oral QHS PRN Eligha Bridegroom, NP        Lab Results: No results found for this or any previous visit (from the past 48 hour(s)).  Blood Alcohol level:  Lab Results  Component Value Date   ETH <10 04/06/2023   ETH <10 02/28/2023    Metabolic Disorder Labs: Lab Results  Component Value Date   HGBA1C 5.2 03/03/2023   MPG 102.54 03/03/2023   MPG 96.8 07/07/2021   No results found for: "PROLACTIN" Lab Results  Component Value Date   CHOL 217 (H) 03/03/2023   TRIG 82  03/03/2023   HDL 39 (L) 03/03/2023   CHOLHDL 5.6 03/03/2023   VLDL 16 03/03/2023   LDLCALC 162 (H) 03/03/2023   LDLCALC 138 (H) 07/07/2021    Physical Findings: AIMS:  , ,  ,  ,    CIWA:    COWS:     Musculoskeletal: Strength & Muscle Tone: within normal limits Gait & Station: normal Patient leans: N/A  Psychiatric Specialty Exam:  Presentation  General Appearance:  Appropriate for Environment  Eye Contact: Good  Speech: Normal Rate; Clear and Coherent  Speech Volume: Normal  Handedness: Right   Mood and Affect  Mood: Depressed; Anxious  Affect: Congruent   Thought Process  Thought Processes: Linear; Goal Directed; Coherent  Descriptions of Associations:Intact  Orientation:Full (Time, Place and Person)  Thought Content:Logical  History of Schizophrenia/Schizoaffective disorder:No data recorded Duration of Psychotic Symptoms:No data recorded Hallucinations:No data recorded Ideas of Reference:None  Suicidal Thoughts:No data recorded Homicidal Thoughts:No data recorded  Sensorium  Memory: Immediate Good; Recent Good; Remote Good  Judgment: Poor  Insight: Shallow; Lacking   Executive Functions  Concentration: Good  Attention Span: Good  Recall: Good  Fund of Knowledge: Good  Language: Good   Psychomotor Activity  Psychomotor Activity:No data recorded  Assets  Assets: Housing; Leisure Time; Physical Health; Resilience; Social Support; Talents/Skills; Transportation; Vocational/Educational; Desire for Improvement; Financial Resources/Insurance; Communication Skills   Sleep  Sleep:No data recorded    Blood pressure 113/66, pulse 81, temperature 97.9 F (36.6 C), temperature source Oral, resp. rate 19, height 6\' 3"  (1.905 m), weight 106.6 kg, SpO2 98%. Body mass index is 29.37 kg/m.   Treatment Plan Summary: Daily contact with patient to assess and evaluate symptoms and progress in treatment, Medication management,  and Plan continue current medication.  Sarina Ill, DO 04/09/2023, 12:58 PM
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
## 2023-04-09 NOTE — Group Note (Signed)
Date:  04/09/2023 Time:  10:26 PM  Group Topic/Focus:  Wrap-Up Group:   The focus of this group is to help patients review their daily goal of treatment and discuss progress on daily workbooks.     Participation Level:  Did Not Attend  Participation Quality:   none  Affect:   none  Cognitive:   none  Insight: None  Engagement in Group:   none  Modes of Intervention:   none  Additional Comments:  none   Belva Crome 04/09/2023, 10:26 PM

## 2023-04-09 NOTE — Consult Note (Signed)
Initial Consultation Note   Patient: Vincent Frank MVH:[id number] DOB: 28-May-2001 PCP: Patient, No Pcp Per DOA: 04/07/2023 DOS: the patient was seen and examined on 04/09/2023 Primary service: Vincent Frank  Referring physician: Dr. Marlou Frank Reason for consult: Left arm laceration  Assessment/Plan: Assessment and Plan:  Arm laceration, left, subsequent encounter Patient initially presented to the ED on 9/10 with a left arm laceration that required suture placement.  Sutures were removed earlier today by the patient with complete opening of the wound, so sutures were replaced today.  - Given high risk for infection, complete 7 days of Keflex 500 mg 4 times daily - Suture removal due in 10 days   TRH will sign off at present, please call us again when needed.  HPI: Vincent Frank is a 22 y.o. male with past medical history of major depression disorder, who is currently admitted for depression. TRH consulted due to left arm laceration.   Per chart review, patient was seen on 9/10 at the [facility] ED after self injuring behavior where patient cut himself in the left forearm multiple times.  At that time, CTA was obtained with no evidence of arterial involvement.  Sutures were placed and patient was [facility] on Keflex to [facility].  Today, patient had a conversation with his mother that upset him.  He subsequently took out his own sutures.  At this time, he is experiencing some pain in the surrounding area but otherwise denies any acute complaints.  Review of Systems: As mentioned in the history of present illness. All other systems reviewed and are negative.  Past Medical History:  Diagnosis Date   Major depressive disorder    No past surgical history on file.  Social History:  reports that he has never smoked. He has never used smokeless tobacco. He reports that he does not drink alcohol and does not use drugs.  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    No family history on file.  Prior to Admission medications   Medication Sig Start Date End Date Taking? Authorizing Provider  ARIPiprazole (ABILIFY) 2 MG tablet Take 1 tablet (2 mg total) by mouth at bedtime. Antidepressant augmentation 03/20/23   Vincent Frank  cephALEXin (KEFLEX) 500 MG capsule Take 1 capsule (500 mg total) by mouth 4 (four) times daily. 04/06/23   Vincent Frank  FLUoxetine (PROZAC) 40 MG capsule Take 1 capsule (40 mg total) by mouth at bedtime. For depression 03/20/23   Vincent Frank  hydrOXYzine (ATARAX) 25 MG tablet Take 1 tablet (25 mg total) by mouth every 6 (six) hours as needed for anxiety. Patient taking differently: Take 25 mg by mouth as needed for anxiety. 03/20/23   Vincent Frank  ibuprofen (ADVIL) 800 MG tablet Take 1 tablet (800 mg total) by mouth every 6 (six) hours as needed for moderate pain. 03/20/23   Vincent Frank  neomycin-bacitracin-polymyxin (NEOSPORIN) OINT Apply 1 Application topically 2 (two) times daily. For wound care Patient not taking: Reported on 04/06/2023 04/06/23   Vincent Frank    Physical Exam: Vitals:   04/07/23 1900 04/08/23 0607 04/08/23 1813 04/09/23 0622  BP: 111/81 116/66 121/74 113/66  Pulse: 86 80 76 81  Resp: 19 19    Temp: 98.7 F (37.1 C) (!) 97.2  F (36.2 C) 97.9 F (36.6 C) 97.9 F (36.6 C)  TempSrc: Oral   Oral  SpO2: 98% 98% 98% 98%  Weight: 106.6 kg     Height: 6\' 3"  (1.905 m)      Physical Exam Vitals and nursing note reviewed.  Constitutional:      General: He is not in acute distress.    Appearance: He is normal weight.  HENT:     Head: Normocephalic and atraumatic.     Mouth/Throat:     Mouth: Mucous membranes are moist.     Pharynx: Oropharynx is clear.  Pulmonary:     Effort: Pulmonary effort is  normal. No respiratory distress.  Skin:    General: Skin is warm and dry.     Comments: On the dorsal aspect of the left forearm, there is an approximately 5 cm laceration that is approximately 1-1/2 cm deep with minimal serosanguineous drainage.  No surrounding erythema.  Several shallow or lacerations more proximal, 1 of which has 2 sutures in place.  Shallow lacerations appear to be healing well.  Please see picture below.  Neurological:     Mental Status: He is alert.      Data Reviewed:  CBC obtained on 9/10 with WBC of 12.5, hemoglobin of 12.8, and platelets of 255 CMP obtained on 9/10 with bicarb of 21, glucose 104, creatinine 1.06 with GFR above 60  There are no new results to review at this time.   Family Communication: No family at bedside.  Primary team communication: Primary team updated Thank you very much for involving [place] in the care of your patient.  Author: Verdene Lennert, Frank 04/09/2023 4:45 PM  For on call review [email or web address].

## 2023-04-09 NOTE — Group Note (Unsigned)
Date:  04/09/2023 Time:  1:58 AM  Group Topic/Focus:  Spirituality:   The focus of this group is to discuss how one's spirituality can aide in recovery.     Participation Level:  {BHH PARTICIPATION QIHKV:42595}  Participation Quality:  {BHH PARTICIPATION QUALITY:22265}  Affect:  {BHH AFFECT:22266}  Cognitive:  {BHH COGNITIVE:22267}  Insight: {BHH Insight2:20797}  Engagement in Group:  {BHH ENGAGEMENT IN GLOVF:64332}  Modes of Intervention:  {BHH MODES OF INTERVENTION:22269}  Additional Comments:  ***  Lenore Cordia 04/09/2023, 1:58 AM

## 2023-04-09 NOTE — Group Note (Signed)
Date:  04/09/2023 Time:  7:13 PM  Group Topic/Focus:  Activity Group:  The purpose of the group was to hold a card game and communicate with each other to talk about our feelings.    Participation Level:  Did Not Attend   Mary Sella Mckenna Gamm 04/09/2023, 7:13 PM

## 2023-04-09 NOTE — BHH Suicide Risk Assessment (Signed)
BHH INPATIENT:  Family/Significant Other Suicide Prevention Education  Suicide Prevention Education:  Education Completed; Suezanne Jacquet, mom, [phone] has been identified by the patient as the family member/significant other with whom the patient will be residing, and identified as the person(s) who will aid the patient in the event of a mental health crisis (suicidal ideations/suicide attempt).  With written consent from the patient, the family member/significant other has been provided the following suicide prevention education, prior to the and/or following the discharge of the patient.  The suicide prevention education provided includes the following: Suicide risk factors Suicide prevention and interventions National Suicide Hotline telephone number [facility] assessment telephone number [facility] Emergency Assistance 911 [facility] and/or Residential Mobile Crisis Unit telephone number  Request made of family/significant other to: Remove weapons (e.g., guns, rifles, knives), all items previously/currently identified as safety concern.   Remove drugs/medications (over-the-counter, prescriptions, illicit drugs), all items previously/currently identified as a safety concern.  The family member/significant other verbalizes understanding of the suicide prevention education information provided.  The family member/significant other agrees to remove the items of safety concern listed above.   Harden Mo 04/09/2023, 4:08 PM

## 2023-04-09 NOTE — BHH Counselor (Signed)
CSW went to patient's room to invite to treatment team.   Patient was observed to be awake and laying on his left side with back facing the room doorway where this writer was standing.   Patient declined to attend treatment team, however asked for CSW to get the nurse for him.   CSW informed patient that this Clinical research associate will get the nurse as requested.   CSW was later informed that patient had likely pulled his suture from the laceration made to his arm that brought him to the hospital initially.  Penni Homans, MSW, LCSW 04/09/2023 2:18 PM

## 2023-04-09 NOTE — Assessment & Plan Note (Signed)
Patient resutured on 9/13.   Given high risk for infection, complete 7 days of Keflex 500 mg 4 times daily Suture removal due in 9 days

## 2023-04-09 NOTE — Group Note (Signed)
Recreation Therapy Group Note   Group Topic:Leisure Education  Group Date: 04/09/2023 Start Time: 1000 End Time: 1100 Facilitators: Rosina Lowenstein, LRT, CTRS Location:  Craft Room  Group Description: Leisure. Patients were given the option to choose from singing karaoke, coloring mandalas, using oil pastels, journaling, or playing with play-doh. LRT and pts discussed the meaning of leisure, the importance of participating in leisure during their free time/when they're outside of the hospital, as well as how our leisure interests can also serve as coping skills.    Goal Area(s) Addressed:  Patient will identify a current leisure interest.  Patient will learn the definition of "leisure". Patient will practice making a positive decision. Patient will have the opportunity to try a new leisure activity. Patient will communicate with peers and LRT.    Affect/Mood: N/A   Participation Level: Did not attend    Clinical Observations/Individualized Feedback: Vincent Frank did not attend group.  Plan: Continue to engage patient in RT group sessions 2-3x/week.   Rosina Lowenstein, LRT, CTRS 04/09/2023 11:13 AM

## 2023-04-09 NOTE — Plan of Care (Signed)
Problem: Education: Goal: Knowledge of General Education information will improve Description: Including pain rating scale, medication(s)/side effects and non-pharmacologic comfort measures Outcome: Not Progressing   Problem: Health Behavior/Discharge Planning: Goal: Ability to manage health-related needs will improve Outcome: Not Progressing   Problem: Clinical Measurements: Goal: Ability to maintain clinical measurements within normal limits will improve Outcome: Not Progressing Goal: Will remain free from infection Outcome: Not Progressing Goal: Diagnostic test results will improve Outcome: Not Progressing   Problem: Activity: Goal: Risk for activity intolerance will decrease Outcome: Not Progressing   Problem: Nutrition: Goal: Adequate nutrition will be maintained Outcome: Not Progressing   Problem: Coping: Goal: Level of anxiety will decrease Outcome: Not Progressing   Problem: Elimination: Goal: Will not experience complications related to bowel motility Outcome: Not Progressing Goal: Will not experience complications related to urinary retention Outcome: Not Progressing   Problem: Pain Managment: Goal: General experience of comfort will improve Outcome: Not Progressing   Problem: Safety: Goal: Ability to remain free from injury will improve Outcome: Not Progressing   Problem: Skin Integrity: Goal: Risk for impaired skin integrity will decrease Outcome: Not Progressing   Problem: Education: Goal: Knowledge of [place] General Education information/materials will improve Outcome: Not Progressing Goal: Emotional status will improve Outcome: Not Progressing Goal: Mental status will improve Outcome: Not Progressing Goal: Verbalization of understanding the information provided will improve Outcome: Not Progressing   Problem: Activity: Goal: Interest or engagement in activities will improve Outcome: Not Progressing Goal: Sleeping patterns will  improve Outcome: Not Progressing   Problem: Coping: Goal: Ability to verbalize frustrations and anger appropriately will improve Outcome: Not Progressing Goal: Ability to demonstrate self-control will improve Outcome: Not Progressing   Problem: Health Behavior/Discharge Planning: Goal: Identification of resources available to assist in meeting health care needs will improve Outcome: Not Progressing Goal: Compliance with treatment plan for underlying cause of condition will improve Outcome: Not Progressing   Problem: Physical Regulation: Goal: Ability to maintain clinical measurements within normal limits will improve Outcome: Not Progressing   Problem: Safety: Goal: Periods of time without injury will increase Outcome: Not Progressing

## 2023-04-09 NOTE — Progress Notes (Signed)
Nursing Shift Note:  1900-0700  Pt was compliant with medications; during evening remained in room/bed after visitation with 1:1 sitter at bedside.  Pt slept some during the evening hours.  Pt showered at least once and it was reported that he showered one other time earlier in the evening.  Pt was instructed to keep the dresing on his arm clean and dry.  Pt stated that he had not urinated this evening but that he did not feel the need to void or that his bladder was full.  Pt denied SI when questioned, but stated that he felt "stable" and then when asked to further describe stated that he felt "overwhelmed."     Pt stated this morning that he had not urinated yet but that his bladder did not feel full and he did not want to be catheterized at this time.  Pt instructed to call for any concerns.  Pt stated he slept well and remains compliant with medications.    Continued 1:1 monitoring for safety at bedside.     04/09/23 2300  Psych Admission Type (Psych Patients Only)  Admission Status Involuntary  Psychosocial Assessment  Patient Complaints Depression  Eye Contact Brief  Facial Expression Flat  Affect Appropriate to circumstance  Speech Logical/coherent  Interaction Forwards little  Motor Activity Slow  Appearance/Hygiene Unremarkable  Behavior Characteristics Appropriate to situation  Mood Pleasant  Thought Process  Coherency WDL  Content WDL  Delusions None reported or observed  Perception WDL  Hallucination None reported or observed  Judgment Impaired  Confusion None  Danger to Self  Current suicidal ideation? Denies  Agreement Not to Harm Self Yes  Danger to Others  Danger to Others None reported or observed  Danger to Others Abnormal  Harmful Behavior to others No threats or harm toward other people  Destructive Behavior No threats or harm toward property

## 2023-04-09 NOTE — BH IP Treatment Plan (Signed)
Interdisciplinary Treatment and Diagnostic Plan Update  04/09/2023 Time of Session: 13:30 Vincent Frank MRN: [id number]  Principal Diagnosis: MDD (major depressive disorder)  Secondary Diagnoses: Principal Problem:   MDD (major depressive disorder)   Current Medications:  Current Facility-Administered Medications  Medication Dose Route Frequency Provider Last Rate Last Admin   acetaminophen (TYLENOL) tablet 650 mg  650 mg Oral Q6H PRN Eligha Bridegroom, NP       alum & mag hydroxide-simeth (MAALOX/MYLANTA) 200-200-20 MG/5ML suspension 30 mL  30 mL Oral Q4H PRN Eligha Bridegroom, NP       ARIPiprazole (ABILIFY) tablet 5 mg  5 mg Oral QHS Eligha Bridegroom, NP   5 mg at 04/08/23 2117   diphenhydrAMINE (BENADRYL) capsule 50 mg  50 mg Oral TID PRN Eligha Bridegroom, NP       Or   diphenhydrAMINE (BENADRYL) injection 50 mg  50 mg Intramuscular TID PRN Eligha Bridegroom, NP       FLUoxetine (PROZAC) capsule 40 mg  40 mg Oral QHS Eligha Bridegroom, NP   40 mg at 04/08/23 2117   gabapentin (NEURONTIN) capsule 100 mg  100 mg Oral TID PRN Eligha Bridegroom, NP       LORazepam (ATIVAN) tablet 2 mg  2 mg Oral TID PRN Eligha Bridegroom, NP       Or   LORazepam (ATIVAN) injection 2 mg  2 mg Intramuscular TID PRN Eligha Bridegroom, NP       magnesium hydroxide (MILK OF MAGNESIA) suspension 30 mL  30 mL Oral Daily PRN Eligha Bridegroom, NP       traZODone (DESYREL) tablet 50 mg  50 mg Oral QHS PRN Eligha Bridegroom, NP       PTA Medications: Medications Prior to Admission  Medication Sig Dispense Refill Last Dose   ARIPiprazole (ABILIFY) 2 MG tablet Take 1 tablet (2 mg total) by mouth at bedtime. Antidepressant augmentation 30 tablet 0    cephALEXin (KEFLEX) 500 MG capsule Take 1 capsule (500 mg total) by mouth 4 (four) times daily. 40 capsule 0    FLUoxetine (PROZAC) 40 MG capsule Take 1 capsule (40 mg total) by mouth at bedtime. For depression 30 capsule 0    hydrOXYzine (ATARAX) 25 MG tablet Take 1  tablet (25 mg total) by mouth every 6 (six) hours as needed for anxiety. (Patient taking differently: Take 25 mg by mouth as needed for anxiety.) 75 tablet 0    ibuprofen (ADVIL) 800 MG tablet Take 1 tablet (800 mg total) by mouth every 6 (six) hours as needed for moderate pain. 20 tablet 0    neomycin-bacitracin-polymyxin (NEOSPORIN) OINT Apply 1 Application topically 2 (two) times daily. For wound care (Patient not taking: Reported on 04/06/2023)       Patient Stressors:    Patient Strengths:    Treatment Modalities: Medication Management, Group therapy, Case management,  1 to 1 session with clinician, Psychoeducation, Recreational therapy.   Physician Treatment Plan for Primary Diagnosis: MDD (major depressive disorder) Long Term Goal(s): Improvement in symptoms so as ready for discharge   Short Term Goals: Ability to identify changes in lifestyle to reduce recurrence of condition will improve Ability to verbalize feelings will improve Ability to disclose and discuss suicidal ideas Ability to demonstrate self-control will improve Ability to identify and develop effective coping behaviors will improve Ability to maintain clinical measurements within normal limits will improve Compliance with prescribed medications will improve Ability to identify triggers associated with substance abuse/mental health issues will improve  Medication Management:  Evaluate patient's response, side effects, and tolerance of medication regimen.  Therapeutic Interventions: 1 to 1 sessions, Unit Group sessions and Medication administration.  Evaluation of Outcomes: Not Met  Physician Treatment Plan for Secondary Diagnosis: Principal Problem:   MDD (major depressive disorder)  Long Term Goal(s): Improvement in symptoms so as ready for discharge   Short Term Goals: Ability to identify changes in lifestyle to reduce recurrence of condition will improve Ability to verbalize feelings will improve Ability to  disclose and discuss suicidal ideas Ability to demonstrate self-control will improve Ability to identify and develop effective coping behaviors will improve Ability to maintain clinical measurements within normal limits will improve Compliance with prescribed medications will improve Ability to identify triggers associated with substance abuse/mental health issues will improve     Medication Management: Evaluate patient's response, side effects, and tolerance of medication regimen.  Therapeutic Interventions: 1 to 1 sessions, Unit Group sessions and Medication administration.  Evaluation of Outcomes: Not Met   RN Treatment Plan for Primary Diagnosis: MDD (major depressive disorder) Long Term Goal(s): Knowledge of disease and therapeutic regimen to maintain health will improve  Short Term Goals: Ability to remain free from injury will improve, Ability to verbalize frustration and anger appropriately will improve, Ability to demonstrate self-control, Ability to participate in decision making will improve, Ability to verbalize feelings will improve, Ability to disclose and discuss suicidal ideas, Ability to identify and develop effective coping behaviors will improve, and Compliance with prescribed medications will improve  Medication Management: RN will administer medications as ordered by provider, will assess and evaluate patient's response and provide education to patient for prescribed medication. RN will report any adverse and/or side effects to prescribing provider.  Therapeutic Interventions: 1 on 1 counseling sessions, Psychoeducation, Medication administration, Evaluate responses to treatment, Monitor vital signs and CBGs as ordered, Perform/monitor CIWA, COWS, AIMS and Fall Risk screenings as ordered, Perform wound care treatments as ordered.  Evaluation of Outcomes: Not Met   LCSW Treatment Plan for Primary Diagnosis: MDD (major depressive disorder) Long Term Goal(s): Safe  transition to appropriate next level of care at discharge, Engage patient in therapeutic group addressing interpersonal concerns.  Short Term Goals: Engage patient in aftercare planning with referrals and resources, Increase social support, Increase ability to appropriately verbalize feelings, Increase emotional regulation, Facilitate acceptance of mental health diagnosis and concerns, and Increase skills for wellness and recovery  Therapeutic Interventions: Assess for all discharge needs, 1 to 1 time with Social worker, Explore available resources and support systems, Assess for adequacy in community support network, Educate family and significant other(s) on suicide prevention, Complete Psychosocial Assessment, Interpersonal group therapy.  Evaluation of Outcomes: Not Met   Progress in Treatment: Attending groups: No. Participating in groups: No. Taking medication as prescribed: Yes. Toleration medication: Yes. Family/Significant other contact made: Yes, individual(s) contacted:  contact was attempted one with mother, Suezanne Jacquet. Second attempt needed.  Patient understands diagnosis: Yes. Discussing patient identified problems/goals with staff: Yes. Medical problems stabilized or resolved: No. Denies suicidal/homicidal ideation: No. Issues/concerns per patient self-inventory: No. Other: none.  New problem(s) identified: No, Describe:  After declining to meet with treatment team, pt asked to speak to the nurse. Upon nurse meeting with pt it was discovered that pt has removed his own sutures. He has been placed on 1:1.   New Short Term/Long Term Goal(s): medication management for mood stabilization; elimination of SI thoughts; development of comprehensive mental wellness plan.   Patient Goals: Treatment team meeting invitation was extended  to pt by CSW, which pt declined.   Discharge Plan or Barriers: CSW will assist pt with development of an appropriate aftercare/discharge plan.    Reason for Continuation of Hospitalization: Depression Medication stabilization Suicidal ideation  Estimated Length of Stay: 1-7 days  Last 3 [place] Suicide Severity Risk Score: Flowsheet Row Admission (Current) from 04/07/2023 in [facility] ED from 04/06/2023 in [facility] Department at [facility] ED from 03/31/2023 in [facility] at [facility])  C-SSRS RISK CATEGORY Error: Q3, 4, or 5 should not be populated when Q2 is No High Risk Error: Question 1 not populated       Last [facility] 2/9 Scores:     No data to display          Scribe for Treatment Team: Glenis Smoker, LCSW 04/09/2023 1:42 PM
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
## 2023-04-09 NOTE — Procedures (Signed)
Laceration repair:  - Consent: verbal consented obtained after discussion on the benefits and risks - Preparation: Wound cleaned with normal saline, prepped with  betadine  - Anesthesia:  local  with  lidocaine 2% with epi,  10 cc used - Irrigation:  sterile water - Exploration: deep wound with no complications, no foreign bodies - Sutures:  Prolene 3-0 , 10 single stitches - Dressing: Dry dressing applied - Complications: None. Procedure tolerated well. Minimal blood loss. - Instructions: Keep clean and dry. If worsening erythema, fever or pain, please seek re-evaluation - Suture removal in 10  days - Antibiotics: Cephalexin 500mg  po QID for 7 days   Signed,  Dr. Verdene Lennert [facility]  To contact, please use AMION. For [facility], use secure chat.   04/09/2023, 6:12 PM

## 2023-04-09 NOTE — Progress Notes (Signed)
D- Patient alert and oriented x 4. Affect flat/mood congruent. Denies SI/ HI/ AVH. Patient complains of left arm pain 8/10 secondary to recent sutures being replaced. Patient endorses mild depression and anxiety. He states his goal of the day is able to go back to work. He states he still is unable to void. Bladder was scanned and 365 cc was noted. He was I/O cathed and 600 cc were obtained of amber urine. At present he is still unable to void. He request to "let me try, I promise". He states he is in 8/10 secondary to suture placement and PRN Percocet administered. Dressing remains dry and intact. A- Scheduled medications administered to patient, per MD orders.New orders placed  for Keflex and PRN pain medication. Support and encouragement provided.  Routine safety checks conducted every 15 minutes without incident.  Patient informed to notify staff with problems or concerns and verbalizes understanding. R- No adverse drug reactions noted. He tolerated the resuturing procedure well. Patient receptive and cooperative. He is newly placed on 1:1 secondary to safety issues/ taking his sutures out and suicide precautions.

## 2023-04-10 DIAGNOSIS — R338 Other retention of urine: Secondary | ICD-10-CM

## 2023-04-10 DIAGNOSIS — K5900 Constipation, unspecified: Secondary | ICD-10-CM

## 2023-04-10 DIAGNOSIS — F3341 Major depressive disorder, recurrent, in partial remission: Secondary | ICD-10-CM | POA: Diagnosis not present

## 2023-04-10 DIAGNOSIS — S41112D Laceration without foreign body of left upper arm, subsequent encounter: Secondary | ICD-10-CM

## 2023-04-10 DIAGNOSIS — F339 Major depressive disorder, recurrent, unspecified: Secondary | ICD-10-CM

## 2023-04-10 MED ORDER — POLYETHYLENE GLYCOL 3350 17 G PO PACK
17.0000 g | PACK | Freq: Two times a day (BID) | ORAL | Status: DC
  Administered 2023-04-10: 17 g via ORAL
  Filled 2023-04-10: qty 1

## 2023-04-10 MED ORDER — MAGNESIUM CITRATE PO SOLN
1.0000 | Freq: Once | ORAL | Status: AC | PRN
  Administered 2023-04-10: 1 via ORAL
  Filled 2023-04-10: qty 296

## 2023-04-10 MED ORDER — LACTULOSE 10 GM/15ML PO SOLN
30.0000 g | Freq: Once | ORAL | Status: AC
  Administered 2023-04-10: 30 g via ORAL
  Filled 2023-04-10: qty 60

## 2023-04-10 NOTE — Plan of Care (Signed)
  Problem: Clinical Measurements: Goal: Will remain free from infection Outcome: Progressing   Problem: Education: Goal: Mental status will improve Outcome: Progressing

## 2023-04-10 NOTE — Progress Notes (Signed)
D- Patient alert and oriented x 4. Affect flat/mood labile. Denies SI/ HI/ AVH. Patient complains of pain 8/10. PRN Percocet with fair results. He was seen by Hospitalist related to inability to void. 8 am I/O cath output 900 cc clear amber urine. He was ordered an indwelling foley catheter x 72 hours with 1:1 supervision by staff. Patient endorses mild depression and anxiety. He has not shown any signs of self mutilation or other hurtful behavior. He complains of constipation and Mag Citrate ordered x 1 with no results. Lactulose 30 gm ordered and administrated po.States moderate amount loose liquid stool was passed. #6f 15cc Foley catheter inserted without resistance 100 cc output straw colored urine. He tolerated it well. He was instructed how to manage his catheter and drainage bag and verbalizes understanding. Left arm incision/wound with sutures dry and intact, slight swelling, no drainage and no signs or symptoms of infection. Dressing change performed. Cleansed with NS and covered w/ folded 4" x4"'s and wrapped with Kerlix. A- Scheduled and PRN medications administered to patient, per MD orders. Support and encouragement provided. 1:1 constant supervision for safety. Patient informed to notify staff with problems or concerns and verbalizes understanding. R- No adverse drug reactions noted.  Patient compliant with medications and treatment plan. Patient receptive, calm cooperative. He isolates to his room except for meals. He did participate in Community Group this morning and was out in the dayroom in the evening. Patient contracts for safety and  remains safe on the unit at this time.

## 2023-04-10 NOTE — [facility] Group Notes (Signed)
[facility] Group Notes:  (Nursing/MHT/Case Management/Adjunct)  Date:  04/10/2023  Time:  5:04 PMAdult Psychoeducational Group Note  Date:  04/10/2023 Time:  5:04 PM  Group Topic/Focus:  Emotional Education:   The focus of this group is to discuss what feelings/emotions are, and how they are experienced.  Participation Level:  Active  Participation Quality:  Appropriate  Affect:  Appropriate  Cognitive:  Appropriate  Insight: Improving  Engagement in Group:  Engaged  Modes of Intervention:  Discussion  Additional Comments:    Lucilla Edin 04/10/2023, 5:04 PM
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
## 2023-04-10 NOTE — Assessment & Plan Note (Signed)
Will give MiraLAX twice a day and a dose of mag citrate.

## 2023-04-10 NOTE — Group Note (Signed)
Date:  04/10/2023 Time:  4:51 PM  Group Topic/Focus:  Activity Group:  The focus of this group is to encourage patients to go outside to the courtyard to assist with their mental health to get some fresh air and exercise.     Participation Level:  Did Not Attend   Mary Sella Oaklie Durrett 04/10/2023, 4:51 PM

## 2023-04-10 NOTE — Assessment & Plan Note (Signed)
Patient on Prozac Abilify and lorazepam as needed
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
## 2023-04-10 NOTE — Progress Notes (Signed)
Signed      D- Pt alert and oriented . Pt reported  slight anxiety and depression at this time. Pt denied experiencing any pain.Pt denies experiencing any SI/HI or AVH at this time. Pt wanted his  catheter to be removed tonight. Pt received medication for anxiety and that helped him calm down.   A- Scheduled medications administered  to pt  per MD orders . Support and encouragement provided . Frequent verbal contact made .Routine safety checks conducted q15 mins.    R- Adverse drug reactions noted . Pt verbally contracts for safety at this time. Pt compliant with medication and treatment plan. Pt stated that doctors where using her as a lab right by testing different kinds of meds on her to see which one would work. Pt remains safe at this time , plan of care ongoing.
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
## 2023-04-10 NOTE — Assessment & Plan Note (Addendum)
With hydronephrosis seen on prior CT scan.  Patient received in and out catheterizations since being on the psychiatric floor.  Will place Foley catheter.  Case discussed with nursing staff and charge nurse that they can take care of the Foley catheter on the psychiatric floor.  He has a one-to-one observer at this point to ensure that the Foley catheter does not get pulled out.

## 2023-04-11 ENCOUNTER — Other Ambulatory Visit: Payer: Self-pay

## 2023-04-11 ENCOUNTER — Encounter (HOSPITAL_COMMUNITY): Payer: Self-pay

## 2023-04-11 ENCOUNTER — Inpatient Hospital Stay
Admit: 2023-04-11 | Discharge: 2023-04-14 | DRG: 093 | Payer: BC Managed Care – PPO | Source: Ambulatory Visit | Attending: Internal Medicine | Admitting: Internal Medicine

## 2023-04-11 ENCOUNTER — Encounter: Payer: Self-pay | Admitting: Internal Medicine

## 2023-04-11 DIAGNOSIS — D508 Other iron deficiency anemias: Secondary | ICD-10-CM | POA: Diagnosis not present

## 2023-04-11 DIAGNOSIS — F3341 Major depressive disorder, recurrent, in partial remission: Secondary | ICD-10-CM | POA: Diagnosis not present

## 2023-04-11 DIAGNOSIS — T7849XA Other allergy, initial encounter: Secondary | ICD-10-CM | POA: Diagnosis present

## 2023-04-11 DIAGNOSIS — R4588 Nonsuicidal self-harm: Secondary | ICD-10-CM | POA: Diagnosis not present

## 2023-04-11 DIAGNOSIS — Z1152 Encounter for screening for COVID-19: Secondary | ICD-10-CM | POA: Diagnosis not present

## 2023-04-11 DIAGNOSIS — Z79899 Other long term (current) drug therapy: Secondary | ICD-10-CM

## 2023-04-11 DIAGNOSIS — S51812D Laceration without foreign body of left forearm, subsequent encounter: Secondary | ICD-10-CM

## 2023-04-11 DIAGNOSIS — F603 Borderline personality disorder: Secondary | ICD-10-CM | POA: Diagnosis present

## 2023-04-11 DIAGNOSIS — S41119A Laceration without foreign body of unspecified upper arm, initial encounter: Secondary | ICD-10-CM | POA: Diagnosis not present

## 2023-04-11 DIAGNOSIS — R339 Retention of urine, unspecified: Secondary | ICD-10-CM | POA: Diagnosis present

## 2023-04-11 DIAGNOSIS — F419 Anxiety disorder, unspecified: Secondary | ICD-10-CM | POA: Diagnosis present

## 2023-04-11 DIAGNOSIS — R000 Tachycardia, unspecified: Secondary | ICD-10-CM | POA: Diagnosis present

## 2023-04-11 DIAGNOSIS — T7840XA Allergy, unspecified, initial encounter: Secondary | ICD-10-CM

## 2023-04-11 DIAGNOSIS — K5909 Other constipation: Secondary | ICD-10-CM

## 2023-04-11 DIAGNOSIS — D509 Iron deficiency anemia, unspecified: Secondary | ICD-10-CM | POA: Insufficient documentation

## 2023-04-11 DIAGNOSIS — R208 Other disturbances of skin sensation: Secondary | ICD-10-CM | POA: Diagnosis present

## 2023-04-11 DIAGNOSIS — F329 Major depressive disorder, single episode, unspecified: Secondary | ICD-10-CM | POA: Diagnosis present

## 2023-04-11 DIAGNOSIS — S41112D Laceration without foreign body of left upper arm, subsequent encounter: Secondary | ICD-10-CM | POA: Diagnosis not present

## 2023-04-11 DIAGNOSIS — K5900 Constipation, unspecified: Secondary | ICD-10-CM | POA: Diagnosis present

## 2023-04-11 DIAGNOSIS — D649 Anemia, unspecified: Secondary | ICD-10-CM

## 2023-04-11 DIAGNOSIS — T50905D Adverse effect of unspecified drugs, medicaments and biological substances, subsequent encounter: Secondary | ICD-10-CM | POA: Diagnosis not present

## 2023-04-11 DIAGNOSIS — Z888 Allergy status to other drugs, medicaments and biological substances status: Secondary | ICD-10-CM

## 2023-04-11 DIAGNOSIS — R253 Fasciculation: Secondary | ICD-10-CM | POA: Diagnosis present

## 2023-04-11 DIAGNOSIS — X789XXD Intentional self-harm by unspecified sharp object, subsequent encounter: Secondary | ICD-10-CM | POA: Diagnosis present

## 2023-04-11 DIAGNOSIS — J4550 Severe persistent asthma, uncomplicated: Secondary | ICD-10-CM | POA: Diagnosis not present

## 2023-04-11 DIAGNOSIS — T426X5A Adverse effect of other antiepileptic and sedative-hypnotic drugs, initial encounter: Secondary | ICD-10-CM | POA: Diagnosis present

## 2023-04-11 DIAGNOSIS — Z560 Unemployment, unspecified: Secondary | ICD-10-CM | POA: Diagnosis not present

## 2023-04-11 DIAGNOSIS — R1032 Left lower quadrant pain: Secondary | ICD-10-CM | POA: Diagnosis not present

## 2023-04-11 DIAGNOSIS — T50905A Adverse effect of unspecified drugs, medicaments and biological substances, initial encounter: Secondary | ICD-10-CM | POA: Diagnosis not present

## 2023-04-11 DIAGNOSIS — Z91030 Bee allergy status: Secondary | ICD-10-CM

## 2023-04-11 DIAGNOSIS — Z7289 Other problems related to lifestyle: Secondary | ICD-10-CM

## 2023-04-11 DIAGNOSIS — S3720XD Unspecified injury of bladder, subsequent encounter: Secondary | ICD-10-CM

## 2023-04-11 DIAGNOSIS — R338 Other retention of urine: Secondary | ICD-10-CM | POA: Diagnosis present

## 2023-04-11 DIAGNOSIS — T50905S Adverse effect of unspecified drugs, medicaments and biological substances, sequela: Secondary | ICD-10-CM | POA: Diagnosis not present

## 2023-04-11 LAB — BASIC METABOLIC PANEL
Anion gap: 8 (ref 5–15)
BUN: 11 mg/dL (ref 6–20)
CO2: 25 mmol/L (ref 22–32)
Calcium: 9 mg/dL (ref 8.9–10.3)
Chloride: 103 mmol/L (ref 98–111)
Creatinine, Ser: 0.92 mg/dL (ref 0.61–1.24)
GFR, Estimated: >60 mL/min (ref >60)
Glucose, Bld: 120 mg/dL — ABNORMAL HIGH (ref 70–99)
Potassium: 3.9 mmol/L (ref 3.5–5.1)
Sodium: 136 mmol/L (ref 135–145)

## 2023-04-11 LAB — CBC
HCT: 35.5 % — ABNORMAL LOW (ref 39.0–52.0)
Hemoglobin: 12 g/dL — ABNORMAL LOW (ref 13.0–17.0)
MCH: 26.7 pg (ref 26.0–34.0)
MCHC: 33.8 g/dL (ref 30.0–36.0)
MCV: 79.1 fL — ABNORMAL LOW (ref 80.0–100.0)
Platelets: 245 10*3/uL (ref 150–400)
RBC: 4.49 MIL/uL (ref 4.22–5.81)
RDW: 12.6 % (ref 11.5–15.5)
WBC: 8.3 10*3/uL (ref 4.0–10.5)
nRBC: 0 % (ref 0.0–0.2)

## 2023-04-11 LAB — FERRITIN: Ferritin: 9 ng/mL — ABNORMAL LOW (ref 24–336)

## 2023-04-11 MED ORDER — CYPROHEPTADINE HCL 4 MG PO TABS
12.0000 mg | ORAL_TABLET | Freq: Once | ORAL | Status: AC
  Administered 2023-04-11: 12 mg via ORAL
  Filled 2023-04-11: qty 3

## 2023-04-11 MED ORDER — ONDANSETRON HCL 4 MG/2ML IJ SOLN
4.0000 mg | Freq: Four times a day (QID) | INTRAMUSCULAR | Status: DC | PRN
  Administered 2023-04-13: 4 mg via INTRAVENOUS
  Filled 2023-04-11: qty 2

## 2023-04-11 MED ORDER — METHYLPREDNISOLONE SODIUM SUCC 40 MG IJ SOLR
40.0000 mg | Freq: Once | INTRAMUSCULAR | Status: AC
  Administered 2023-04-11: 40 mg via INTRAVENOUS
  Filled 2023-04-11: qty 1

## 2023-04-11 MED ORDER — ACETAMINOPHEN 325 MG PO TABS
650.0000 mg | ORAL_TABLET | Freq: Four times a day (QID) | ORAL | Status: DC | PRN
  Administered 2023-04-11 – 2023-04-13 (×4): 650 mg via ORAL
  Filled 2023-04-11 (×4): qty 2

## 2023-04-11 MED ORDER — POLYETHYLENE GLYCOL 3350 17 G PO PACK: 17.0000 g | PACK | Freq: Every day | ORAL | Status: DC | PRN

## 2023-04-11 MED ORDER — HYDROXYZINE HCL 50 MG PO TABS
25.0000 mg | ORAL_TABLET | ORAL | Status: DC | PRN
  Administered 2023-04-11: 25 mg via ORAL
  Filled 2023-04-11: qty 1

## 2023-04-11 MED ORDER — ENOXAPARIN SODIUM 40 MG/0.4ML IJ SOSY
40.0000 mg | PREFILLED_SYRINGE | INTRAMUSCULAR | Status: DC
  Administered 2023-04-11 – 2023-04-14 (×4): 40 mg via SUBCUTANEOUS
  Filled 2023-04-11 (×3): qty 0.4

## 2023-04-11 MED ORDER — CEPHALEXIN 500 MG PO CAPS
500.0000 mg | ORAL_CAPSULE | Freq: Four times a day (QID) | ORAL | Status: DC
  Administered 2023-04-11 – 2023-04-14 (×10): 500 mg via ORAL
  Filled 2023-04-11 (×11): qty 1

## 2023-04-11 MED ORDER — ONDANSETRON HCL 4 MG PO TABS: 4.0000 mg | ORAL_TABLET | Freq: Four times a day (QID) | ORAL | Status: DC | PRN

## 2023-04-11 MED ORDER — FERROUS SULFATE 325 (65 FE) MG PO TABS
325.0000 mg | ORAL_TABLET | Freq: Every day | ORAL | Status: DC
  Administered 2023-04-12 – 2023-04-14 (×3): 325 mg via ORAL
  Filled 2023-04-11 (×3): qty 1

## 2023-04-11 MED ORDER — ACETAMINOPHEN 650 MG RE SUPP: 650.0000 mg | Freq: Four times a day (QID) | RECTAL | Status: DC | PRN

## 2023-04-11 MED ORDER — DIPHENHYDRAMINE HCL 50 MG/ML IJ SOLN
25.0000 mg | Freq: Once | INTRAMUSCULAR | Status: AC
  Administered 2023-04-11: 25 mg via INTRAVENOUS
  Filled 2023-04-11: qty 1

## 2023-04-11 MED ORDER — SODIUM CHLORIDE 0.9 % IV SOLN: INTRAVENOUS | Status: DC

## 2023-04-11 MED ORDER — SODIUM CHLORIDE 0.9 % IV BOLUS: 500.0000 mL | Freq: Once | INTRAVENOUS | Status: DC

## 2023-04-11 MED ORDER — FAMOTIDINE IN NACL 20-0.9 MG/50ML-% IV SOLN
20.0000 mg | Freq: Once | INTRAVENOUS | Status: AC
  Administered 2023-04-11: 20 mg via INTRAVENOUS
  Filled 2023-04-11: qty 50

## 2023-04-11 MED ORDER — CEPHALEXIN 500 MG PO CAPS
500.0000 mg | ORAL_CAPSULE | Freq: Four times a day (QID) | ORAL | Status: DC
  Administered 2023-04-11: 500 mg via ORAL
  Filled 2023-04-11: qty 1

## 2023-04-11 MED ORDER — ARIPIPRAZOLE 2 MG PO TABS
2.0000 mg | ORAL_TABLET | Freq: Every day | ORAL | Status: DC
  Administered 2023-04-11 – 2023-04-13 (×3): 2 mg via ORAL
  Filled 2023-04-11 (×4): qty 1

## 2023-04-11 NOTE — Significant Event (Signed)
Rapid Response Event Note   Reason for Call :called RRT for suspected allergic reaction to new medication.    Initial Focused Assessment: laying in bed, unfocussed, c/o "skin on fire", "pain everywhere", tremors... VS 97.9 temp, 100% RA, 125/102, pulse 107.      Interventions: Dr Renae Gloss to bedside, benedryl, solumedrol, and pepcid ordered. Placed new iv and given all meds... will d/c from behavorial and admit to med surg.   Plan of Care: as above    Event Summary: as above  MD Notified: Dr Renae Gloss [id number] Call Time:1254 Arrival Time:1256 End Time:1358  Karesa Maultsby A, RN

## 2023-04-11 NOTE — Plan of Care (Signed)
Problem: Education: Goal: Knowledge of General Education information will improve Description: Including pain rating scale, medication(s)/side effects and non-pharmacologic comfort measures 04/11/2023 0758 by Loreen Freud, RN Outcome: Progressing 04/11/2023 0757 by Loreen Freud, RN Outcome: Progressing   Problem: Health Behavior/Discharge Planning: Goal: Ability to manage health-related needs will improve 04/11/2023 0758 by Loreen Freud, RN Outcome: Progressing 04/11/2023 0757 by Loreen Freud, RN Outcome: Progressing   Problem: Clinical Measurements: Goal: Ability to maintain clinical measurements within normal limits will improve 04/11/2023 0758 by Loreen Freud, RN Outcome: Progressing 04/11/2023 0757 by Loreen Freud, RN Outcome: Progressing Goal: Will remain free from infection 04/11/2023 0758 by Loreen Freud, RN Outcome: Progressing 04/11/2023 0757 by Loreen Freud, RN Outcome: Progressing Goal: Diagnostic test results will improve 04/11/2023 0758 by Loreen Freud, RN Outcome: Progressing 04/11/2023 0757 by Loreen Freud, RN Outcome: Progressing   Problem: Activity: Goal: Risk for activity intolerance will decrease 04/11/2023 0758 by Loreen Freud, RN Outcome: Progressing 04/11/2023 0757 by Loreen Freud, RN Outcome: Progressing   Problem: Nutrition: Goal: Adequate nutrition will be maintained 04/11/2023 0758 by Loreen Freud, RN Outcome: Progressing 04/11/2023 0757 by Loreen Freud, RN Outcome: Progressing   Problem: Coping: Goal: Level of anxiety will decrease 04/11/2023 0758 by Loreen Freud, RN Outcome: Progressing 04/11/2023 0757 by Loreen Freud, RN Outcome: Progressing   Problem: Elimination: Goal: Will not experience complications related to bowel motility 04/11/2023 0758 by Loreen Freud, RN Outcome: Progressing 04/11/2023 0757 by Loreen Freud, RN Outcome: Progressing Goal: Will not experience complications related to urinary  retention 04/11/2023 0758 by Loreen Freud, RN Outcome: Progressing 04/11/2023 0757 by Loreen Freud, RN Outcome: Progressing   Problem: Pain Managment: Goal: General experience of comfort will improve 04/11/2023 0758 by Loreen Freud, RN Outcome: Progressing 04/11/2023 0757 by Loreen Freud, RN Outcome: Progressing   Problem: Safety: Goal: Ability to remain free from injury will improve 04/11/2023 0758 by Loreen Freud, RN Outcome: Progressing 04/11/2023 0757 by Loreen Freud, RN Outcome: Progressing   Problem: Skin Integrity: Goal: Risk for impaired skin integrity will decrease 04/11/2023 0758 by Loreen Freud, RN Outcome: Progressing 04/11/2023 0757 by Loreen Freud, RN Outcome: Progressing   Problem: Education: Goal: Knowledge of [place] [facility] Education information/materials will improve 04/11/2023 0758 by Loreen Freud, RN Outcome: Progressing 04/11/2023 0757 by Loreen Freud, RN Outcome: Progressing Goal: Emotional status will improve 04/11/2023 0758 by Loreen Freud, RN Outcome: Progressing 04/11/2023 0757 by Loreen Freud, RN Outcome: Progressing Goal: Mental status will improve 04/11/2023 0758 by Loreen Freud, RN Outcome: Progressing 04/11/2023 0757 by Loreen Freud, RN Outcome: Progressing Goal: Verbalization of understanding the information provided will improve 04/11/2023 0758 by Loreen Freud, RN Outcome: Progressing 04/11/2023 0757 by Loreen Freud, RN Outcome: Progressing   Problem: Activity: Goal: Interest or engagement in activities will improve 04/11/2023 0758 by Loreen Freud, RN Outcome: Progressing 04/11/2023 0757 by Loreen Freud, RN Outcome: Progressing Goal: Sleeping patterns will improve 04/11/2023 0758 by Loreen Freud, RN Outcome: Progressing 04/11/2023 0757 by Loreen Freud, RN Outcome: Progressing   Problem: Coping: Goal: Ability to verbalize frustrations and anger appropriately will improve 04/11/2023 0758 by Loreen Freud, RN Outcome: Progressing 04/11/2023 0757 by Loreen Freud, RN Outcome: Progressing Goal: Ability to demonstrate self-control will improve 04/11/2023 0758 by Loreen Freud, RN Outcome: Progressing 04/11/2023 0757 by Loreen Freud, RN Outcome: Progressing   Problem: Health Behavior/Discharge Planning: Goal: Identification of resources available to assist in meeting health care needs will improve 04/11/2023 0758 by Loreen Freud, RN Outcome: Progressing 04/11/2023 0757 by Loreen Freud, RN Outcome: Progressing Goal:  Compliance with treatment plan for underlying cause of condition will improve 04/11/2023 0758 by Loreen Freud, RN Outcome: Progressing 04/11/2023 0757 by Loreen Freud, RN Outcome: Progressing   Problem: Physical Regulation: Goal: Ability to maintain clinical measurements within normal limits will improve 04/11/2023 0758 by Loreen Freud, RN Outcome: Progressing 04/11/2023 0757 by Loreen Freud, RN Outcome: Progressing   Problem: Safety: Goal: Periods of time without injury will increase 04/11/2023 0758 by Loreen Freud, RN Outcome: Progressing 04/11/2023 0757 by Loreen Freud, RN Outcome: Progressing

## 2023-04-11 NOTE — Assessment & Plan Note (Signed)
Will get psych consult on the medical floor.  Hold Prozac and trazodone for right now.  Continue Abilify.

## 2023-04-11 NOTE — Assessment & Plan Note (Signed)
Patient was resutured on 9/13.  Continue Keflex.

## 2023-04-11 NOTE — Plan of Care (Signed)
Problem: Education: Goal: Knowledge of General Education information will improve Description: Including pain rating scale, medication(s)/side effects and non-pharmacologic comfort measures 04/11/2023 0758 by Loreen Freud, RN Outcome: Progressing 04/11/2023 0758 by Loreen Freud, RN Outcome: Progressing 04/11/2023 0758 by Loreen Freud, RN Outcome: Progressing 04/11/2023 0757 by Loreen Freud, RN Outcome: Progressing   Problem: Health Behavior/Discharge Planning: Goal: Ability to manage health-related needs will improve 04/11/2023 0758 by Loreen Freud, RN Outcome: Progressing 04/11/2023 0758 by Loreen Freud, RN Outcome: Progressing 04/11/2023 0758 by Loreen Freud, RN Outcome: Progressing 04/11/2023 0757 by Loreen Freud, RN Outcome: Progressing   Problem: Clinical Measurements: Goal: Ability to maintain clinical measurements within normal limits will improve 04/11/2023 0758 by Loreen Freud, RN Outcome: Progressing 04/11/2023 0758 by Loreen Freud, RN Outcome: Progressing 04/11/2023 0758 by Loreen Freud, RN Outcome: Progressing 04/11/2023 0757 by Loreen Freud, RN Outcome: Progressing Goal: Will remain free from infection 04/11/2023 0758 by Loreen Freud, RN Outcome: Progressing 04/11/2023 0758 by Loreen Freud, RN Outcome: Progressing 04/11/2023 0758 by Loreen Freud, RN Outcome: Progressing 04/11/2023 0757 by Loreen Freud, RN Outcome: Progressing Goal: Diagnostic test results will improve 04/11/2023 0758 by Loreen Freud, RN Outcome: Progressing 04/11/2023 0758 by Loreen Freud, RN Outcome: Progressing 04/11/2023 0758 by Loreen Freud, RN Outcome: Progressing 04/11/2023 0757 by Loreen Freud, RN Outcome: Progressing   Problem: Activity: Goal: Risk for activity intolerance will decrease 04/11/2023 0758 by Loreen Freud, RN Outcome: Progressing 04/11/2023 0758 by Loreen Freud, RN Outcome: Progressing 04/11/2023 0758 by Loreen Freud, RN Outcome:  Progressing 04/11/2023 0757 by Loreen Freud, RN Outcome: Progressing   Problem: Nutrition: Goal: Adequate nutrition will be maintained 04/11/2023 0758 by Loreen Freud, RN Outcome: Progressing 04/11/2023 0758 by Loreen Freud, RN Outcome: Progressing 04/11/2023 0758 by Loreen Freud, RN Outcome: Progressing 04/11/2023 0757 by Loreen Freud, RN Outcome: Progressing   Problem: Coping: Goal: Level of anxiety will decrease 04/11/2023 0758 by Loreen Freud, RN Outcome: Progressing 04/11/2023 0758 by Loreen Freud, RN Outcome: Progressing 04/11/2023 0758 by Loreen Freud, RN Outcome: Progressing 04/11/2023 0757 by Loreen Freud, RN Outcome: Progressing   Problem: Elimination: Goal: Will not experience complications related to bowel motility 04/11/2023 0758 by Loreen Freud, RN Outcome: Progressing 04/11/2023 0758 by Loreen Freud, RN Outcome: Progressing 04/11/2023 0758 by Loreen Freud, RN Outcome: Progressing 04/11/2023 0757 by Loreen Freud, RN Outcome: Progressing Goal: Will not experience complications related to urinary retention 04/11/2023 0758 by Loreen Freud, RN Outcome: Progressing 04/11/2023 0758 by Loreen Freud, RN Outcome: Progressing 04/11/2023 0758 by Loreen Freud, RN Outcome: Progressing 04/11/2023 0757 by Loreen Freud, RN Outcome: Progressing   Problem: Pain Managment: Goal: General experience of comfort will improve 04/11/2023 0758 by Loreen Freud, RN Outcome: Progressing 04/11/2023 0758 by Loreen Freud, RN Outcome: Progressing 04/11/2023 0758 by Loreen Freud, RN Outcome: Progressing 04/11/2023 0757 by Loreen Freud, RN Outcome: Progressing   Problem: Safety: Goal: Ability to remain free from injury will improve 04/11/2023 0758 by Loreen Freud, RN Outcome: Progressing 04/11/2023 0758 by Loreen Freud, RN Outcome: Progressing 04/11/2023 0758 by Loreen Freud, RN Outcome: Progressing 04/11/2023 0757 by Loreen Freud, RN Outcome:  Progressing   Problem: Skin Integrity: Goal: Risk for impaired skin integrity will decrease 04/11/2023 0758 by Loreen Freud, RN Outcome: Progressing 04/11/2023 0758 by Loreen Freud, RN Outcome: Progressing 04/11/2023 0758 by Loreen Freud, RN Outcome: Progressing 04/11/2023 0757 by Loreen Freud, RN Outcome: Progressing   Problem: Education: Goal: Knowledge of [facility] Education information/materials will improve 04/11/2023 0758 by Loreen Freud, RN Outcome: Progressing 04/11/2023 0758 by Loreen Freud, RN  Outcome: Progressing 04/11/2023 0758 by Loreen Freud, RN Outcome: Progressing 04/11/2023 0757 by Loreen Freud, RN Outcome: Progressing Goal: Emotional status will improve 04/11/2023 0758 by Loreen Freud, RN Outcome: Progressing 04/11/2023 0758 by Loreen Freud, RN Outcome: Progressing 04/11/2023 0758 by Loreen Freud, RN Outcome: Progressing 04/11/2023 0757 by Loreen Freud, RN Outcome: Progressing Goal: Mental status will improve 04/11/2023 0758 by Loreen Freud, RN Outcome: Progressing 04/11/2023 0758 by Loreen Freud, RN Outcome: Progressing 04/11/2023 0758 by Loreen Freud, RN Outcome: Progressing 04/11/2023 0757 by Loreen Freud, RN Outcome: Progressing Goal: Verbalization of understanding the information provided will improve 04/11/2023 0758 by Loreen Freud, RN Outcome: Progressing 04/11/2023 0758 by Loreen Freud, RN Outcome: Progressing 04/11/2023 0758 by Loreen Freud, RN Outcome: Progressing 04/11/2023 0757 by Loreen Freud, RN Outcome: Progressing   Problem: Activity: Goal: Interest or engagement in activities will improve 04/11/2023 0758 by Loreen Freud, RN Outcome: Progressing 04/11/2023 0758 by Loreen Freud, RN Outcome: Progressing 04/11/2023 0758 by Loreen Freud, RN Outcome: Progressing 04/11/2023 0757 by Loreen Freud, RN Outcome: Progressing Goal: Sleeping patterns will improve 04/11/2023 0758 by Loreen Freud,  RN Outcome: Progressing 04/11/2023 0758 by Loreen Freud, RN Outcome: Progressing 04/11/2023 0758 by Loreen Freud, RN Outcome: Progressing 04/11/2023 0757 by Loreen Freud, RN Outcome: Progressing   Problem: Coping: Goal: Ability to verbalize frustrations and anger appropriately will improve 04/11/2023 0758 by Loreen Freud, RN Outcome: Progressing 04/11/2023 0758 by Loreen Freud, RN Outcome: Progressing 04/11/2023 0758 by Loreen Freud, RN Outcome: Progressing 04/11/2023 0757 by Loreen Freud, RN Outcome: Progressing Goal: Ability to demonstrate self-control will improve 04/11/2023 0758 by Loreen Freud, RN Outcome: Progressing 04/11/2023 0758 by Loreen Freud, RN Outcome: Progressing 04/11/2023 0758 by Loreen Freud, RN Outcome: Progressing 04/11/2023 0757 by Loreen Freud, RN Outcome: Progressing   Problem: Health Behavior/Discharge Planning: Goal: Identification of resources available to assist in meeting health care needs will improve 04/11/2023 0758 by Loreen Freud, RN Outcome: Progressing 04/11/2023 0758 by Loreen Freud, RN Outcome: Progressing 04/11/2023 0758 by Loreen Freud, RN Outcome: Progressing 04/11/2023 0757 by Loreen Freud, RN Outcome: Progressing Goal: Compliance with treatment plan for underlying cause of condition will improve 04/11/2023 0758 by Loreen Freud, RN Outcome: Progressing 04/11/2023 0758 by Loreen Freud, RN Outcome: Progressing 04/11/2023 0758 by Loreen Freud, RN Outcome: Progressing 04/11/2023 0757 by Loreen Freud, RN Outcome: Progressing   Problem: Physical Regulation: Goal: Ability to maintain clinical measurements within normal limits will improve 04/11/2023 0758 by Loreen Freud, RN Outcome: Progressing 04/11/2023 0758 by Loreen Freud, RN Outcome: Progressing 04/11/2023 0758 by Loreen Freud, RN Outcome: Progressing 04/11/2023 0757 by Loreen Freud, RN Outcome: Progressing   Problem: Safety: Goal: Periods of  time without injury will increase 04/11/2023 0758 by Loreen Freud, RN Outcome: Progressing 04/11/2023 0758 by Loreen Freud, RN Outcome: Progressing 04/11/2023 0758 by Loreen Freud, RN Outcome: Progressing 04/11/2023 0757 by Loreen Freud, RN Outcome: Progressing

## 2023-04-11 NOTE — Progress Notes (Addendum)
Called report to Kirsten RN at 1C (medical/telemetry).   1358: patient transferred to medical floor with tech and rapid response RN by side.

## 2023-04-11 NOTE — Assessment & Plan Note (Signed)
Add on a ferritin.  Last hemoglobin 12.
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
## 2023-04-11 NOTE — Plan of Care (Signed)
  Problem: Education: Goal: Knowledge of General Education information will improve Description: Including pain rating scale, medication(s)/side effects and non-pharmacologic comfort measures Outcome: Progressing   Problem: Health Behavior/Discharge Planning: Goal: Ability to manage health-related needs will improve Outcome: Progressing   Problem: Clinical Measurements: Goal: Ability to maintain clinical measurements within normal limits will improve Outcome: Progressing Goal: Will remain free from infection Outcome: Progressing Goal: Diagnostic test results will improve Outcome: Progressing   Problem: Activity: Goal: Risk for activity intolerance will decrease Outcome: Progressing   Problem: Nutrition: Goal: Adequate nutrition will be maintained Outcome: Progressing   Problem: Coping: Goal: Level of anxiety will decrease Outcome: Progressing   Problem: Elimination: Goal: Will not experience complications related to bowel motility Outcome: Progressing Goal: Will not experience complications related to urinary retention Outcome: Progressing   Problem: Pain Managment: Goal: General experience of comfort will improve Outcome: Progressing   Problem: Safety: Goal: Ability to remain free from injury will improve Outcome: Progressing   Problem: Skin Integrity: Goal: Risk for impaired skin integrity will decrease Outcome: Progressing   Problem: Education: Goal: Knowledge of [facility] Education information/materials will improve Outcome: Progressing Goal: Emotional status will improve Outcome: Progressing Goal: Mental status will improve Outcome: Progressing Goal: Verbalization of understanding the information provided will improve Outcome: Progressing   Problem: Activity: Goal: Interest or engagement in activities will improve Outcome: Progressing Goal: Sleeping patterns will improve Outcome: Progressing   Problem: Coping: Goal: Ability to verbalize  frustrations and anger appropriately will improve Outcome: Progressing Goal: Ability to demonstrate self-control will improve Outcome: Progressing   Problem: Health Behavior/Discharge Planning: Goal: Identification of resources available to assist in meeting health care needs will improve Outcome: Progressing Goal: Compliance with treatment plan for underlying cause of condition will improve Outcome: Progressing   Problem: Physical Regulation: Goal: Ability to maintain clinical measurements within normal limits will improve Outcome: Progressing   Problem: Safety: Goal: Periods of time without injury will increase Outcome: Progressing

## 2023-04-11 NOTE — Assessment & Plan Note (Signed)
Resolved
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
## 2023-04-11 NOTE — Assessment & Plan Note (Signed)
Foley catheter discontinued today.  Patient has a history of retaining urine with his psychiatric issues.

## 2023-04-11 NOTE — Progress Notes (Signed)
  Chaplain On-Call responded to Rapid Response notification to the Behavioral Medicine Unit at 1254.  Chaplain called the Unit from the hallway. RN Lupita Leash stated the patient had an allergic reaction, and will be moved to Inpatient Unit 1-C.  Chaplain will follow up as able.  Chaplain Evelena Peat M.Div., [facility]

## 2023-04-11 NOTE — Progress Notes (Signed)
[facility] MD Progress Note  04/11/2023 11:13 AM Vincent Frank  MRN:  [id number] Subjective: Vincent Frank is seen on rounds.  He states that he feels fine.  His affect is very euthymic.  He is very impulsive and cut himself after having some problems at work the day that he was admitted.  Then after he was on the phone with his mom 2 days ago he took the sutures out which was put back in by medicine.  Today, nurses report no issues and he continues to be a one-to-one.  He has been compliant with medications and no side effects. 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 Family History: No family history on file. Family Psychiatric  History: Brother with bipolar disorder Social History:  Social History   Substance and Sexual Activity  Alcohol Use Never     Social History   Substance and Sexual Activity  Drug Use Never    Social History   Socioeconomic History   Marital status: Single    Spouse name: Not on file   Number of children: Not on file   Years of education: Not on file   Highest education level: Not on file  Occupational History   Not on file  Tobacco Use   Smoking status: Never   Smokeless tobacco: Never  Vaping Use   Vaping status: Never Used  Substance and Sexual Activity   Alcohol use: Never   Drug use: Never   Sexual activity: Not Currently  Other Topics Concern   Not on file  Social History Narrative   Not on file   Social Determinants of Health   Financial Resource Strain: Not on file  Food Insecurity: No Food Insecurity (03/16/2023)   Hunger Vital Sign     Worried About Running Out of Food in the Last Year: Never true    Ran Out of Food in the Last Year: Never true  Transportation Needs: No Transportation Needs (03/16/2023)   PRAPARE - Administrator, Civil Service (Medical): No    Lack of Transportation (Non-Medical): No  Physical Activity: Not on file  Stress: Not on file  Social Connections: Unknown (01/16/2023)   Received from [facility]   Social Network    Social Network: Not on file   Additional Social History:                         Sleep: Good  Appetite:  Good  Current Medications: Current Facility-Administered Medications  Medication Dose Route Frequency Provider Last Rate Last Admin   acetaminophen (TYLENOL) tablet 650 mg  650 mg Oral Q6H PRN Eligha Bridegroom, NP       alum & mag hydroxide-simeth (MAALOX/MYLANTA) 200-200-20 MG/5ML suspension 30 mL  30 mL Oral Q4H PRN Eligha Bridegroom, NP       ARIPiprazole (ABILIFY) tablet 5 mg  5 mg Oral QHS Eligha Bridegroom, NP   5 mg at 04/10/23 2143   cephALEXin (KEFLEX) capsule 500 mg  500 mg Oral Q6H Verdene Lennert, MD   500 mg at 04/11/23 0704   FLUoxetine (PROZAC) capsule  40 mg  40 mg Oral QHS Eligha Bridegroom, NP   40 mg at 04/10/23 2139   gabapentin (NEURONTIN) capsule 100 mg  100 mg Oral TID PRN Eligha Bridegroom, NP       LORazepam (ATIVAN) tablet 2 mg  2 mg Oral TID PRN Eligha Bridegroom, NP   2 mg at 04/09/23 1331   Or   LORazepam (ATIVAN) injection 2 mg  2 mg Intramuscular TID PRN Eligha Bridegroom, NP   2 mg at 04/10/23 2146   magnesium hydroxide (MILK OF MAGNESIA) suspension 30 mL  30 mL Oral Daily PRN Eligha Bridegroom, NP       polyethylene glycol (MIRALAX / GLYCOLAX) packet 17 g  17 g Oral BID Alford Highland, MD   17 g at 04/10/23 1145   traZODone (DESYREL) tablet 50 mg  50 mg Oral QHS PRN Eligha Bridegroom, NP   50 mg at 04/10/23 2143    Lab Results:  Results for orders placed or performed during the hospital encounter of 04/07/23 (from the past  48 hour(s))  CBC     Status: Abnormal   Collection Time: 04/11/23 10:29 AM  Result Value Ref Range   WBC 8.3 4.0 - 10.5 K/uL   RBC 4.49 4.22 - 5.81 MIL/uL   Hemoglobin 12.0 (L) 13.0 - 17.0 g/dL   HCT 78.2 (L) 95.6 - 21.3 %   MCV 79.1 (L) 80.0 - 100.0 fL   MCH 26.7 26.0 - 34.0 pg   MCHC 33.8 30.0 - 36.0 g/dL   RDW 08.6 57.8 - 46.9 %   Platelets 245 150 - 400 K/uL   nRBC 0.0 0.0 - 0.2 %    Comment: Performed at [facility], [address] [place], [place] [address]  Basic metabolic panel     Status: Abnormal   Collection Time: 04/11/23 10:29 AM  Result Value Ref Range   Sodium 136 135 - 145 mmol/L   Potassium 3.9 3.5 - 5.1 mmol/L   Chloride 103 98 - 111 mmol/L   CO2 25 22 - 32 mmol/L   Glucose, Bld 120 (H) 70 - 99 mg/dL    Comment: Glucose reference range applies only to samples taken after fasting for at least 8 hours.   BUN 11 6 - 20 mg/dL   Creatinine, Ser 8.41 0.61 - 1.24 mg/dL   Calcium 9.0 8.9 - 32.4 mg/dL   GFR, Estimated >40 >10 mL/min    Comment: (NOTE) Calculated using the CKD-EPI Creatinine Equation (2021)    Anion gap 8 5 - 15    Comment: Performed at [facility], [address] [place], [place] [address]    Blood Alcohol level:  Lab Results  Component Value Date   [facility] <10 04/06/2023   ETH <10 02/28/2023    Metabolic Disorder Labs: Lab Results  Component Value Date   HGBA1C 5.2 03/03/2023   MPG 102.54 03/03/2023   MPG 96.8 07/07/2021   No results found for: "PROLACTIN" Lab Results  Component Value Date   CHOL 217 (H) 03/03/2023   TRIG 82 03/03/2023   HDL 39 (L) 03/03/2023   CHOLHDL 5.6 03/03/2023   VLDL 16 03/03/2023   LDLCALC 162 (H) 03/03/2023   LDLCALC 138 (H) 07/07/2021    Physical Findings: AIMS:  , ,  ,  ,    CIWA:    COWS:     Musculoskeletal: Strength & Muscle Tone: within normal limits Gait & Station: normal Patient leans: N/A  Psychiatric Specialty Exam:  Presentation  General Appearance:   Appropriate for Environment  Eye Contact: Good  Speech: Normal Rate; Clear and Coherent  Speech Volume: Normal  Handedness: Right   Mood and Affect  Mood: Depressed; Anxious  Affect: Congruent   Thought Process  Thought Processes: Linear; Goal Directed; Coherent  Descriptions of Associations:Intact  Orientation:Full (Time, Place and Person)  Thought Content:Logical  History of Schizophrenia/Schizoaffective disorder:No data recorded Duration of Psychotic Symptoms:No data recorded Hallucinations:No data recorded Ideas of Reference:None  Suicidal Thoughts:No data recorded Homicidal Thoughts:No data recorded  Sensorium  Memory: Immediate Good; Recent Good; Remote Good  Judgment: Poor  Insight: Shallow; Lacking   Executive Functions  Concentration: Good  Attention Span: Good  Recall: Good  Fund of Knowledge: Good  Language: Good   Psychomotor Activity  Psychomotor Activity:No data recorded  Assets  Assets: Housing; Leisure Time; Physical Health; Resilience; Social Support; Talents/Skills; Transportation; Vocational/Educational; Desire for Improvement; Financial Resources/Insurance; Communication Skills   Sleep  Sleep:No data recorded   Physical Exam: Physical Exam Vitals and nursing note reviewed.  Constitutional:      Appearance: Normal appearance. He is normal weight.  Neurological:     General: No focal deficit present.     Mental Status: He is alert and oriented to person, place, and time.  Psychiatric:        Attention and Perception: Attention and perception normal.        Mood and Affect: Mood and affect normal.        Speech: Speech normal.        Behavior: Behavior normal. Behavior is cooperative.        Thought Content: Thought content normal.        Cognition and Memory: Cognition normal.        Judgment: Judgment normal.    Review of Systems  Constitutional: Negative.   HENT: Negative.    Eyes: Negative.    Respiratory: Negative.    Cardiovascular: Negative.   Gastrointestinal: Negative.   Genitourinary: Negative.   Musculoskeletal: Negative.   Skin: Negative.   Neurological: Negative.   Endo/Heme/Allergies: Negative.   Psychiatric/Behavioral: Negative.     Blood pressure 118/70, pulse 89, temperature 98 F (36.7 C), resp. rate 19, height 6\' 3"  (1.905 m), weight 106.6 kg, SpO2 99%. Body mass index is 29.37 kg/m.   Treatment Plan Summary: Daily contact with patient to assess and evaluate symptoms and progress in treatment, Medication management, and Plan continue current medication.  Nyree Yonker Tresea Mall, DO 04/11/2023, 11:13 AM

## 2023-04-11 NOTE — Plan of Care (Signed)
Problem: Education: Goal: Knowledge of General Education information will improve Description: Including pain rating scale, medication(s)/side effects and non-pharmacologic comfort measures 04/11/2023 0801 by Loreen Freud, RN Outcome: Progressing 04/11/2023 0758 by Loreen Freud, RN Outcome: Progressing 04/11/2023 0758 by Loreen Freud, RN Outcome: Progressing 04/11/2023 0758 by Loreen Freud, RN Outcome: Progressing 04/11/2023 0757 by Loreen Freud, RN Outcome: Progressing   Problem: Health Behavior/Discharge Planning: Goal: Ability to manage health-related needs will improve 04/11/2023 0801 by Loreen Freud, RN Outcome: Progressing 04/11/2023 0758 by Loreen Freud, RN Outcome: Progressing 04/11/2023 0758 by Loreen Freud, RN Outcome: Progressing 04/11/2023 0758 by Loreen Freud, RN Outcome: Progressing 04/11/2023 0757 by Loreen Freud, RN Outcome: Progressing   Problem: Clinical Measurements: Goal: Ability to maintain clinical measurements within normal limits will improve 04/11/2023 0801 by Loreen Freud, RN Outcome: Progressing 04/11/2023 0758 by Loreen Freud, RN Outcome: Progressing 04/11/2023 0758 by Loreen Freud, RN Outcome: Progressing 04/11/2023 0758 by Loreen Freud, RN Outcome: Progressing 04/11/2023 0757 by Loreen Freud, RN Outcome: Progressing Goal: Will remain free from infection 04/11/2023 0801 by Loreen Freud, RN Outcome: Progressing 04/11/2023 0758 by Loreen Freud, RN Outcome: Progressing 04/11/2023 0758 by Loreen Freud, RN Outcome: Progressing 04/11/2023 0758 by Loreen Freud, RN Outcome: Progressing 04/11/2023 0757 by Loreen Freud, RN Outcome: Progressing Goal: Diagnostic test results will improve 04/11/2023 0801 by Loreen Freud, RN Outcome: Progressing 04/11/2023 0758 by Loreen Freud, RN Outcome: Progressing 04/11/2023 0758 by Loreen Freud, RN Outcome: Progressing 04/11/2023 0758 by Loreen Freud, RN Outcome:  Progressing 04/11/2023 0757 by Loreen Freud, RN Outcome: Progressing   Problem: Activity: Goal: Risk for activity intolerance will decrease 04/11/2023 0801 by Loreen Freud, RN Outcome: Progressing 04/11/2023 0758 by Loreen Freud, RN Outcome: Progressing 04/11/2023 0758 by Loreen Freud, RN Outcome: Progressing 04/11/2023 0758 by Loreen Freud, RN Outcome: Progressing 04/11/2023 0757 by Loreen Freud, RN Outcome: Progressing   Problem: Nutrition: Goal: Adequate nutrition will be maintained 04/11/2023 0801 by Loreen Freud, RN Outcome: Progressing 04/11/2023 0758 by Loreen Freud, RN Outcome: Progressing 04/11/2023 0758 by Loreen Freud, RN Outcome: Progressing 04/11/2023 0758 by Loreen Freud, RN Outcome: Progressing 04/11/2023 0757 by Loreen Freud, RN Outcome: Progressing   Problem: Coping: Goal: Level of anxiety will decrease 04/11/2023 0801 by Loreen Freud, RN Outcome: Progressing 04/11/2023 0758 by Loreen Freud, RN Outcome: Progressing 04/11/2023 0758 by Loreen Freud, RN Outcome: Progressing 04/11/2023 0758 by Loreen Freud, RN Outcome: Progressing 04/11/2023 0757 by Loreen Freud, RN Outcome: Progressing   Problem: Elimination: Goal: Will not experience complications related to bowel motility 04/11/2023 0801 by Loreen Freud, RN Outcome: Progressing 04/11/2023 0758 by Loreen Freud, RN Outcome: Progressing 04/11/2023 0758 by Loreen Freud, RN Outcome: Progressing 04/11/2023 0758 by Loreen Freud, RN Outcome: Progressing 04/11/2023 0757 by Loreen Freud, RN Outcome: Progressing Goal: Will not experience complications related to urinary retention 04/11/2023 0801 by Loreen Freud, RN Outcome: Progressing 04/11/2023 0758 by Loreen Freud, RN Outcome: Progressing 04/11/2023 0758 by Loreen Freud, RN Outcome: Progressing 04/11/2023 0758 by Loreen Freud, RN Outcome: Progressing 04/11/2023 0757 by Loreen Freud, RN Outcome: Progressing   Problem:  Pain Managment: Goal: General experience of comfort will improve 04/11/2023 0801 by Loreen Freud, RN Outcome: Progressing 04/11/2023 0758 by Loreen Freud, RN Outcome: Progressing 04/11/2023 0758 by Loreen Freud, RN Outcome: Progressing 04/11/2023 0758 by Loreen Freud, RN Outcome: Progressing 04/11/2023 0757 by Loreen Freud, RN Outcome: Progressing   Problem: Safety: Goal: Ability to remain free from injury will improve 04/11/2023 0801 by Loreen Freud, RN Outcome: Progressing 04/11/2023 0758 by Loreen Freud, RN Outcome: Progressing 04/11/2023  [id number] by Loreen Freud, RN Outcome: Progressing 04/11/2023 0758 by Loreen Freud, RN Outcome: Progressing 04/11/2023 0757 by Loreen Freud, RN Outcome: Progressing   Problem: Skin Integrity: Goal: Risk for impaired skin integrity will decrease 04/11/2023 0801 by Loreen Freud, RN Outcome: Progressing 04/11/2023 0758 by Loreen Freud, RN Outcome: Progressing 04/11/2023 0758 by Loreen Freud, RN Outcome: Progressing 04/11/2023 0758 by Loreen Freud, RN Outcome: Progressing 04/11/2023 0757 by Loreen Freud, RN Outcome: Progressing   Problem: Education: Goal: Knowledge of [place] [facility] Education information/materials will improve 04/11/2023 0801 by Loreen Freud, RN Outcome: Progressing 04/11/2023 0758 by Loreen Freud, RN Outcome: Progressing 04/11/2023 0758 by Loreen Freud, RN Outcome: Progressing 04/11/2023 0758 by Loreen Freud, RN Outcome: Progressing 04/11/2023 0757 by Loreen Freud, RN Outcome: Progressing Goal: Emotional status will improve 04/11/2023 0801 by Loreen Freud, RN Outcome: Progressing 04/11/2023 0758 by Loreen Freud, RN Outcome: Progressing 04/11/2023 0758 by Loreen Freud, RN Outcome: Progressing 04/11/2023 0758 by Loreen Freud, RN Outcome: Progressing 04/11/2023 0757 by Loreen Freud, RN Outcome: Progressing Goal: Mental status will improve 04/11/2023 0801 by Loreen Freud,  RN Outcome: Progressing 04/11/2023 0758 by Loreen Freud, RN Outcome: Progressing 04/11/2023 0758 by Loreen Freud, RN Outcome: Progressing 04/11/2023 0758 by Loreen Freud, RN Outcome: Progressing 04/11/2023 0757 by Loreen Freud, RN Outcome: Progressing Goal: Verbalization of understanding the information provided will improve 04/11/2023 0801 by Loreen Freud, RN Outcome: Progressing 04/11/2023 0758 by Loreen Freud, RN Outcome: Progressing 04/11/2023 0758 by Loreen Freud, RN Outcome: Progressing 04/11/2023 0758 by Loreen Freud, RN Outcome: Progressing 04/11/2023 0757 by Loreen Freud, RN Outcome: Progressing   Problem: Activity: Goal: Interest or engagement in activities will improve 04/11/2023 0801 by Loreen Freud, RN Outcome: Progressing 04/11/2023 0758 by Loreen Freud, RN Outcome: Progressing 04/11/2023 0758 by Loreen Freud, RN Outcome: Progressing 04/11/2023 0758 by Loreen Freud, RN Outcome: Progressing 04/11/2023 0757 by Loreen Freud, RN Outcome: Progressing Goal: Sleeping patterns will improve 04/11/2023 0801 by Loreen Freud, RN Outcome: Progressing 04/11/2023 0758 by Loreen Freud, RN Outcome: Progressing 04/11/2023 0758 by Loreen Freud, RN Outcome: Progressing 04/11/2023 0758 by Loreen Freud, RN Outcome: Progressing 04/11/2023 0757 by Loreen Freud, RN Outcome: Progressing   Problem: Coping: Goal: Ability to verbalize frustrations and anger appropriately will improve 04/11/2023 0801 by Loreen Freud, RN Outcome: Progressing 04/11/2023 0758 by Loreen Freud, RN Outcome: Progressing 04/11/2023 0758 by Loreen Freud, RN Outcome: Progressing 04/11/2023 0758 by Loreen Freud, RN Outcome: Progressing 04/11/2023 0757 by Loreen Freud, RN Outcome: Progressing Goal: Ability to demonstrate self-control will improve 04/11/2023 0801 by Loreen Freud, RN Outcome: Progressing 04/11/2023 0758 by Loreen Freud, RN Outcome: Progressing 04/11/2023 0758  by Loreen Freud, RN Outcome: Progressing 04/11/2023 0758 by Loreen Freud, RN Outcome: Progressing 04/11/2023 0757 by Loreen Freud, RN Outcome: Progressing   Problem: Health Behavior/Discharge Planning: Goal: Identification of resources available to assist in meeting health care needs will improve 04/11/2023 0801 by Loreen Freud, RN Outcome: Progressing 04/11/2023 0758 by Loreen Freud, RN Outcome: Progressing 04/11/2023 0758 by Loreen Freud, RN Outcome: Progressing 04/11/2023 0758 by Loreen Freud, RN Outcome: Progressing 04/11/2023 0757 by Loreen Freud, RN Outcome: Progressing Goal: Compliance with treatment plan for underlying cause of condition will improve 04/11/2023 0801 by Loreen Freud, RN Outcome: Progressing 04/11/2023 0758 by Loreen Freud, RN Outcome: Progressing 04/11/2023 0758 by Loreen Freud, RN Outcome: Progressing 04/11/2023 0758 by Loreen Freud, RN Outcome: Progressing 04/11/2023 0757 by Loreen Freud, RN Outcome: Progressing   Problem: Physical Regulation: Goal: Ability to maintain clinical measurements within normal limits will improve 04/11/2023 0801 by  Loreen Freud, RN Outcome: Progressing 04/11/2023 0758 by Loreen Freud, RN Outcome: Progressing 04/11/2023 0758 by Loreen Freud, RN Outcome: Progressing 04/11/2023 0758 by Loreen Freud, RN Outcome: Progressing 04/11/2023 0757 by Loreen Freud, RN Outcome: Progressing   Problem: Safety: Goal: Periods of time without injury will increase 04/11/2023 0801 by Loreen Freud, RN Outcome: Progressing 04/11/2023 0758 by Loreen Freud, RN Outcome: Progressing 04/11/2023 0758 by Loreen Freud, RN Outcome: Progressing 04/11/2023 0758 by Loreen Freud, RN Outcome: Progressing 04/11/2023 0757 by Loreen Freud, RN Outcome: Progressing

## 2023-04-11 NOTE — Consult Note (Signed)
Psychiatric consult received today. Per Alford Highland MD, patient does not need to be seen today. Per Alford Highland MD,  patient needs to receive a psychiatric evaluation tomorrow.

## 2023-04-11 NOTE — H&P (Signed)
History and Physical    Patient: Vincent Frank WJX:[id number] DOB: 11-Aug-2000 DOA: 04/11/2023 DOS: the patient was seen and examined on 04/11/2023 PCP: Patient, No Pcp Per  Patient coming from: Psychiatric floor  Chief Complaint: Reaction to gabapentin HPI: Vincent Frank is a 22 y.o. male with medical history significant of depression with cutting himself.  Patient has been on the psychiatric floor for few days.  Medicine consultation was done because he pulled out some sutures and needed to be resutured.  Also patient having trouble urinating and Foley catheter was placed yesterday.  Patient was given gabapentin for anxiety and ended up having a twitching reaction and felt like his skin was burning.  An IV was placed and IV Solu-Medrol, IV Benadryl and Pepcid was given.  Patient was transferred to the medical floor.  I spoke with the patient's mother and she stated that he has a history of these type of reactions.  States he has taken gabapentin before but patient does not recall that.  She also stated that he has had a history of unable to urinate before.  On the medical floor the patient looks much better not twitching anymore.  Continue to monitor.  Will hold Prozac currently. Review of Systems: Review of Systems  Constitutional:  Positive for chills.  HENT:  Negative for hearing loss.   Eyes:  Negative for blurred vision.  Respiratory:  Negative for shortness of breath.   Cardiovascular:  Negative for chest pain.  Gastrointestinal:  Negative for abdominal pain.  Genitourinary:  Negative for dysuria.  Musculoskeletal:  Positive for myalgias.  Neurological:  Positive for dizziness.  Endo/Heme/Allergies:  Does not bruise/bleed easily.  Psychiatric/Behavioral:  Positive for depression.     Past Medical History:  Diagnosis Date   Major depressive disorder    Past Surgical History:  Procedure Laterality Date   NO PAST SURGERIES     Social History:  reports that he has  never smoked. He has never used smokeless tobacco. He reports that he does not drink alcohol and does not use drugs.  Allergies  Allergen Reactions   Bee Venom Anaphylaxis   Hornet Venom Anaphylaxis and Itching   Gabapentin     twitching   Haldol [Haloperidol] Other (See Comments)    Per father dystonic reaction with  muscle contractures.    Family History  Problem Relation Age of Onset   Healthy Mother     Prior to Admission medications   Medication Sig Start Date End Date Taking? Authorizing Provider  ARIPiprazole (ABILIFY) 2 MG tablet Take 1 tablet (2 mg total) by mouth at bedtime. Antidepressant augmentation 03/20/23   Armandina Stammer I, NP  cephALEXin (KEFLEX) 500 MG capsule Take 1 capsule (500 mg total) by mouth 4 (four) times daily. 04/06/23   Mesner, Barbara Cower, MD  FLUoxetine (PROZAC) 40 MG capsule Take 1 capsule (40 mg total) by mouth at bedtime. For depression 03/20/23   Armandina Stammer I, NP  hydrOXYzine (ATARAX) 25 MG tablet Take 1 tablet (25 mg total) by mouth every 6 (six) hours as needed for anxiety. Patient taking differently: Take 25 mg by mouth as needed for anxiety. 03/20/23   Armandina Stammer I, NP  ibuprofen (ADVIL) 800 MG tablet Take 1 tablet (800 mg total) by mouth every 6 (six) hours as needed for moderate pain. 03/20/23   Armandina Stammer I, NP  neomycin-bacitracin-polymyxin (NEOSPORIN) OINT Apply 1 Application topically 2 (two) times daily. For wound care Patient not taking: Reported on 04/06/2023 04/06/23  Mesner, Barbara Cower, MD    Physical Exam: Physical Exam HENT:     Head: Normocephalic.     Mouth/Throat:     Pharynx: No oropharyngeal exudate.  Eyes:     General: Lids are normal.     Conjunctiva/sclera: Conjunctivae normal.  Cardiovascular:     Rate and Rhythm: Normal rate and regular rhythm.     Heart sounds: Normal heart sounds, S1 normal and S2 normal.  Pulmonary:     Breath sounds: No decreased breath sounds, wheezing, rhonchi or rales.  Abdominal:     Palpations:  Abdomen is soft.     Tenderness: There is no abdominal tenderness.  Musculoskeletal:     Right lower leg: No swelling.     Left lower leg: No swelling.  Skin:    General: Skin is warm.     Comments: Left arm covered.  Numerous healing areas of prior cuts.  Neurological:     Mental Status: He is alert and oriented to person, place, and time.     Comments: During reaction patient was having twitching episodes but was able to talk with me.  Difficulty moving his extremities.  After the episode he was able to straight leg raise and lift his arms up off the bed.     Data Reviewed: Sodium 136, potassium 3.9 chloride 103 CO2 25, calcium 9.0, creatinine 0.92, ferritin 9, hemoglobin 12, platelets 245, white blood cell count 8.3 Assessment and Plan:  1.  Drug reaction to gabapentin with twitching reaction.  Solu-Medrol 40 mg IV x 1, Benadryl 25 mg IV x 1 and IV Pepcid.  Will give 1 dose of cyproheptadine.  Patient doing better once he came up to the medical floor.  Will give IV fluids overnight. 2.  Acute urinary retention.  Continue Foley catheter until Tuesday 3.  Arm laceration, left, subsequent encounter.  Continue Keflex.  Patient was resutured on 9/13.  Sutures can come out in 8 days. 4.  Constipation resolved 5.  Major depression of disorder.  Give Abilify.  Hold Prozac and trazodone. 6.  Iron deficiency anemia.  Will start oral iron tomorrow.    Advance Care Planning:   Code Status: Full Code   Consults: Will get psychiatric consultation  Family Communication: Spoke with patient's mother on the phone  Severity of Illness: The appropriate patient status for this patient is INPATIENT. Inpatient status is judged to be reasonable and necessary in order to provide the required intensity of service to ensure the patient's safety. The patient's presenting symptoms, physical exam findings, and initial radiographic and laboratory data in the context of their chronic comorbidities is felt to  place them at high risk for further clinical deterioration. Furthermore, it is not anticipated that the patient will be medically stable for discharge from the hospital within 2 midnights of admission.   * I certify that at the point of admission it is my clinical judgment that the patient will require inpatient hospital care spanning beyond 2 midnights from the point of admission due to high intensity of service, high risk for further deterioration and high frequency of surveillance required.*  Author: Alford Highland, MD 04/11/2023 2:47 PM  For on call review [email or web address].

## 2023-04-11 NOTE — Progress Notes (Addendum)
8am: Patient observed A&Ox4 with tech by side. Patient eating breakfast. 1 to 1 continues.  9am: Patient refused miralax. States last BM was this morning at 2am and does not need it anymore. Patient denies SI,HI, and A/V/H with no plan or intent. Tech sitting side by side.  10am: Patient remains cooperative and stable. Offered to empty patient's foley bag but patient refused at this time and stated "later." Tech by side. 1 to 1 continues.  11am: Patient resting in bed. Breathing even and unlabored. No s/s of current distress. Tech sitting by side.  11:29am Patient awake and up for lunch. Med compliant. Tech by side. No s/s of current distress.  1200: Patient cooperative and in no current distress. Tech sitting by side.

## 2023-04-11 NOTE — Significant Event (Signed)
    Patient received PRN medication for anxiety after becoming anxious during an encounter with another patient.  Approximately 30 minutes later patient's 1:1 sitter notified this writer that the patient was not feeling well.  Upon assessment pt was in the bathroom stating that he was shaking, aching all over, with stomach and head pain.  Pt returned to his bed and was visibly trembling, jerking/twitching and complaining of burning and pain all over his body.  BP was 159/130 x 2 and rapid response was initiated.  RR team and hospitalist arrived at bedside, with some IV medications administered on unit.  Pt was discharged to medical telemetry for evaluation and medical clearance accompanied by unit staff.

## 2023-04-11 NOTE — Plan of Care (Signed)
Problem: Education: Goal: Knowledge of General Education information will improve Description: Including pain rating scale, medication(s)/side effects and non-pharmacologic comfort measures 04/11/2023 0758 by Loreen Freud, RN Outcome: Progressing 04/11/2023 0758 by Loreen Freud, RN Outcome: Progressing 04/11/2023 0757 by Loreen Freud, RN Outcome: Progressing   Problem: Health Behavior/Discharge Planning: Goal: Ability to manage health-related needs will improve 04/11/2023 0758 by Loreen Freud, RN Outcome: Progressing 04/11/2023 0758 by Loreen Freud, RN Outcome: Progressing 04/11/2023 0757 by Loreen Freud, RN Outcome: Progressing   Problem: Clinical Measurements: Goal: Ability to maintain clinical measurements within normal limits will improve 04/11/2023 0758 by Loreen Freud, RN Outcome: Progressing 04/11/2023 0758 by Loreen Freud, RN Outcome: Progressing 04/11/2023 0757 by Loreen Freud, RN Outcome: Progressing Goal: Will remain free from infection 04/11/2023 0758 by Loreen Freud, RN Outcome: Progressing 04/11/2023 0758 by Loreen Freud, RN Outcome: Progressing 04/11/2023 0757 by Loreen Freud, RN Outcome: Progressing Goal: Diagnostic test results will improve 04/11/2023 0758 by Loreen Freud, RN Outcome: Progressing 04/11/2023 0758 by Loreen Freud, RN Outcome: Progressing 04/11/2023 0757 by Loreen Freud, RN Outcome: Progressing   Problem: Activity: Goal: Risk for activity intolerance will decrease 04/11/2023 0758 by Loreen Freud, RN Outcome: Progressing 04/11/2023 0758 by Loreen Freud, RN Outcome: Progressing 04/11/2023 0757 by Loreen Freud, RN Outcome: Progressing   Problem: Nutrition: Goal: Adequate nutrition will be maintained 04/11/2023 0758 by Loreen Freud, RN Outcome: Progressing 04/11/2023 0758 by Loreen Freud, RN Outcome: Progressing 04/11/2023 0757 by Loreen Freud, RN Outcome: Progressing   Problem: Coping: Goal: Level of  anxiety will decrease 04/11/2023 0758 by Loreen Freud, RN Outcome: Progressing 04/11/2023 0758 by Loreen Freud, RN Outcome: Progressing 04/11/2023 0757 by Loreen Freud, RN Outcome: Progressing   Problem: Elimination: Goal: Will not experience complications related to bowel motility 04/11/2023 0758 by Loreen Freud, RN Outcome: Progressing 04/11/2023 0758 by Loreen Freud, RN Outcome: Progressing 04/11/2023 0757 by Loreen Freud, RN Outcome: Progressing Goal: Will not experience complications related to urinary retention 04/11/2023 0758 by Loreen Freud, RN Outcome: Progressing 04/11/2023 0758 by Loreen Freud, RN Outcome: Progressing 04/11/2023 0757 by Loreen Freud, RN Outcome: Progressing   Problem: Pain Managment: Goal: General experience of comfort will improve 04/11/2023 0758 by Loreen Freud, RN Outcome: Progressing 04/11/2023 0758 by Loreen Freud, RN Outcome: Progressing 04/11/2023 0757 by Loreen Freud, RN Outcome: Progressing   Problem: Safety: Goal: Ability to remain free from injury will improve 04/11/2023 0758 by Loreen Freud, RN Outcome: Progressing 04/11/2023 0758 by Loreen Freud, RN Outcome: Progressing 04/11/2023 0757 by Loreen Freud, RN Outcome: Progressing   Problem: Skin Integrity: Goal: Risk for impaired skin integrity will decrease 04/11/2023 0758 by Loreen Freud, RN Outcome: Progressing 04/11/2023 0758 by Loreen Freud, RN Outcome: Progressing 04/11/2023 0757 by Loreen Freud, RN Outcome: Progressing   Problem: Education: Goal: Knowledge of [place] [facility] Education information/materials will improve 04/11/2023 0758 by Loreen Freud, RN Outcome: Progressing 04/11/2023 0758 by Loreen Freud, RN Outcome: Progressing 04/11/2023 0757 by Loreen Freud, RN Outcome: Progressing Goal: Emotional status will improve 04/11/2023 0758 by Loreen Freud, RN Outcome: Progressing 04/11/2023 0758 by Loreen Freud, RN Outcome:  Progressing 04/11/2023 0757 by Loreen Freud, RN Outcome: Progressing Goal: Mental status will improve 04/11/2023 0758 by Loreen Freud, RN Outcome: Progressing 04/11/2023 0758 by Loreen Freud, RN Outcome: Progressing 04/11/2023 0757 by Loreen Freud, RN Outcome: Progressing Goal: Verbalization of understanding the information provided will improve 04/11/2023 0758 by Loreen Freud, RN Outcome: Progressing 04/11/2023 0758 by Loreen Freud, RN Outcome: Progressing 04/11/2023 0757 by Loreen Freud, RN Outcome: Progressing   Problem:  Activity: Goal: Interest or engagement in activities will improve 04/11/2023 0758 by Loreen Freud, RN Outcome: Progressing 04/11/2023 0758 by Loreen Freud, RN Outcome: Progressing 04/11/2023 0757 by Loreen Freud, RN Outcome: Progressing Goal: Sleeping patterns will improve 04/11/2023 0758 by Loreen Freud, RN Outcome: Progressing 04/11/2023 0758 by Loreen Freud, RN Outcome: Progressing 04/11/2023 0757 by Loreen Freud, RN Outcome: Progressing   Problem: Coping: Goal: Ability to verbalize frustrations and anger appropriately will improve 04/11/2023 0758 by Loreen Freud, RN Outcome: Progressing 04/11/2023 0758 by Loreen Freud, RN Outcome: Progressing 04/11/2023 0757 by Loreen Freud, RN Outcome: Progressing Goal: Ability to demonstrate self-control will improve 04/11/2023 0758 by Loreen Freud, RN Outcome: Progressing 04/11/2023 0758 by Loreen Freud, RN Outcome: Progressing 04/11/2023 0757 by Loreen Freud, RN Outcome: Progressing   Problem: Health Behavior/Discharge Planning: Goal: Identification of resources available to assist in meeting health care needs will improve 04/11/2023 0758 by Loreen Freud, RN Outcome: Progressing 04/11/2023 0758 by Loreen Freud, RN Outcome: Progressing 04/11/2023 0757 by Loreen Freud, RN Outcome: Progressing Goal: Compliance with treatment plan for underlying cause of condition will  improve 04/11/2023 0758 by Loreen Freud, RN Outcome: Progressing 04/11/2023 0758 by Loreen Freud, RN Outcome: Progressing 04/11/2023 0757 by Loreen Freud, RN Outcome: Progressing   Problem: Physical Regulation: Goal: Ability to maintain clinical measurements within normal limits will improve 04/11/2023 0758 by Loreen Freud, RN Outcome: Progressing 04/11/2023 0758 by Loreen Freud, RN Outcome: Progressing 04/11/2023 0757 by Loreen Freud, RN Outcome: Progressing   Problem: Safety: Goal: Periods of time without injury will increase 04/11/2023 0758 by Loreen Freud, RN Outcome: Progressing 04/11/2023 0758 by Loreen Freud, RN Outcome: Progressing 04/11/2023 0757 by Loreen Freud, RN Outcome: Progressing

## 2023-04-11 NOTE — Plan of Care (Signed)
  Problem: Clinical Measurements: Goal: Will remain free from infection Outcome: Progressing   Problem: Coping: Goal: Level of anxiety will decrease Outcome: Progressing   Problem: Elimination: Goal: Will not experience complications related to urinary retention Outcome: Progressing   Problem: Safety: Goal: Ability to remain free from injury will improve Outcome: Progressing   Problem: Health Behavior/Discharge Planning: Goal: Compliance with treatment plan for underlying cause of condition will improve Outcome: Progressing

## 2023-04-11 NOTE — Assessment & Plan Note (Addendum)
This has resolved.  Medically cleared to go to psychiatric floor.  Allergic reaction to gabapentin.  Patient received 1 dose of Solu-Medrol Benadryl and Pepcid.  Also received a dose of cyproheptadine.

## 2023-04-12 DIAGNOSIS — T50905D Adverse effect of unspecified drugs, medicaments and biological substances, subsequent encounter: Secondary | ICD-10-CM | POA: Diagnosis not present

## 2023-04-12 DIAGNOSIS — J4550 Severe persistent asthma, uncomplicated: Secondary | ICD-10-CM

## 2023-04-12 DIAGNOSIS — R338 Other retention of urine: Secondary | ICD-10-CM | POA: Diagnosis not present

## 2023-04-12 DIAGNOSIS — D509 Iron deficiency anemia, unspecified: Secondary | ICD-10-CM | POA: Insufficient documentation

## 2023-04-12 DIAGNOSIS — K5909 Other constipation: Secondary | ICD-10-CM | POA: Diagnosis not present

## 2023-04-12 DIAGNOSIS — S41112D Laceration without foreign body of left upper arm, subsequent encounter: Secondary | ICD-10-CM | POA: Diagnosis not present

## 2023-04-12 LAB — CBC
HCT: 36.3 % — ABNORMAL LOW (ref 39.0–52.0)
Hemoglobin: 12.3 g/dL — ABNORMAL LOW (ref 13.0–17.0)
MCH: 27.2 pg (ref 26.0–34.0)
MCHC: 33.9 g/dL (ref 30.0–36.0)
MCV: 80.1 fL (ref 80.0–100.0)
Platelets: 297 10*3/uL (ref 150–400)
RBC: 4.53 MIL/uL (ref 4.22–5.81)
RDW: 12.4 % (ref 11.5–15.5)
WBC: 15.9 10*3/uL — ABNORMAL HIGH (ref 4.0–10.5)
nRBC: 0 % (ref 0.0–0.2)

## 2023-04-12 LAB — BASIC METABOLIC PANEL
Anion gap: 9 (ref 5–15)
BUN: 12 mg/dL (ref 6–20)
CO2: 25 mmol/L (ref 22–32)
Calcium: 9.3 mg/dL (ref 8.9–10.3)
Chloride: 104 mmol/L (ref 98–111)
Creatinine, Ser: 1.03 mg/dL (ref 0.61–1.24)
GFR, Estimated: >60 mL/min (ref >60)
Glucose, Bld: 136 mg/dL — ABNORMAL HIGH (ref 70–99)
Potassium: 4.3 mmol/L (ref 3.5–5.1)
Sodium: 138 mmol/L (ref 135–145)

## 2023-04-12 LAB — HIV ANTIBODY (ROUTINE TESTING W REFLEX): HIV Screen 4th Generation wRfx: NONREACTIVE

## 2023-04-12 MED ORDER — FLUOXETINE HCL 20 MG PO CAPS
40.0000 mg | ORAL_CAPSULE | Freq: Every day | ORAL | Status: DC
  Administered 2023-04-12 – 2023-04-14 (×3): 40 mg via ORAL
  Filled 2023-04-12 (×3): qty 2

## 2023-04-12 MED ORDER — ACETAMINOPHEN 325 MG PO TABS: 650.0000 mg | ORAL_TABLET | Freq: Four times a day (QID) | ORAL | Status: DC | PRN

## 2023-04-12 MED ORDER — CHLORHEXIDINE GLUCONATE CLOTH 2 % EX PADS
6.0000 | MEDICATED_PAD | Freq: Every day | CUTANEOUS | Status: DC
  Administered 2023-04-12 – 2023-04-13 (×2): 6 via TOPICAL

## 2023-04-12 MED ORDER — FERROUS SULFATE 325 (65 FE) MG PO TABS: 325.0000 mg | ORAL_TABLET | Freq: Every day | ORAL | Status: DC

## 2023-04-12 MED ORDER — POLYETHYLENE GLYCOL 3350 17 G PO PACK: 17.0000 g | PACK | Freq: Every day | ORAL | 0 refills | Status: DC | PRN

## 2023-04-12 NOTE — TOC CM/SW Note (Signed)
Transition of Care [facility]) - Inpatient Brief Assessment   Patient Details  Name: Vincent Frank MRN: [id number] Date of Birth: 08/29/2000  Transition of Care [facility] CM/SW Contact:    Allena Katz, LCSW Phone Number: 04/12/2023, 9:32 AM   Clinical Narrative:    Transition of Care Asessment: Insurance and Status: Insurance coverage has been reviewed Patient has primary care physician:  (added to AVS) Home environment has been reviewed: [id number] [place] [id number] Prior level of function:: INDEPENDENT Prior/Current Home Services: No current home services Social Determinants of Health Reivew: SDOH reviewed no interventions necessary Readmission risk has been reviewed: Yes Transition of care needs: no transition of care needs at this time
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
## 2023-04-12 NOTE — Discharge Summary (Deleted)
  The note originally documented on this encounter has been moved the the encounter in which it belongs.  

## 2023-04-12 NOTE — Discharge Instructions (Signed)
Some PCP options in [place] area- not a comprehensive list  [facility]- [phone] [facility]- [phone] [facility]- [phone] [facility]- [phone] [facility]- [phone] [facility]- [phone]  or [facility] Physician Referral Line [phone]

## 2023-04-12 NOTE — Assessment & Plan Note (Signed)
Oral iron

## 2023-04-12 NOTE — Progress Notes (Signed)
Rounded on patient. Introduced to role of Statistician. Patient to return to inpatient [facility] today. Aware nurse navigator will follow up d/c from [facility].
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
## 2023-04-12 NOTE — Progress Notes (Signed)
[facility] MD Progress Note  04/12/2023 10:38 AM PRAJWAL ZURICK  MRN:  [id number] Subjective: Vincent Frank is seen on rounds on the medical floor.  He was transferred there yesterday after having some mental status changes after Neurontin.  He has borderline personality disorder and tends to overreact and even act out.  Mom states that he has Neurontin at home and he never has that reaction.  He states that he does not take it at home anymore.  Few days ago after a conversation with his mom he removed the sutures from his forearm because he was upset.  Medicine had to suture him back up.  Today, he is pleasant and cooperative and alert and oriented x 3.  He asked when he can go home and I told him when he has a few days of good behavior without a one-to-one.  He did sign a voluntary form today. Principal Problem: Drug reaction Diagnosis: Principal Problem:   Drug reaction Active Problems:   Acute urinary retention   MDD (major depressive disorder)   Arm laceration, left, subsequent encounter   Constipation   Allergic reaction   Iron deficiency anemia  Total Time spent with patient: 15 minutes  Past Psychiatric History: Depression and borderline personality disorder  Past Medical History:  Past Medical History:  Diagnosis Date   Major depressive disorder     Past Surgical History:  Procedure Laterality Date   NO PAST SURGERIES     Family History:  Family History  Problem Relation Age of Onset   Healthy Mother    Family Psychiatric  History: Unremarkable Social History:  Social History   Substance and Sexual Activity  Alcohol Use Never     Social History   Substance and Sexual Activity  Drug Use Never    Social History   Socioeconomic History   Marital status: Single    Spouse name: Not on file   Number of children: Not on file   Years of education: Not on file   Highest education level: Not on file  Occupational History   Not on file  Tobacco Use   Smoking status:  Never   Smokeless tobacco: Never  Vaping Use   Vaping status: Never Used  Substance and Sexual Activity   Alcohol use: Never   Drug use: Never   Sexual activity: Not Currently  Other Topics Concern   Not on file  Social History Narrative   Not on file   Social Determinants of Health   Financial Resource Strain: Not on file  Food Insecurity: No Food Insecurity (04/11/2023)   Hunger Vital Sign    Worried About Running Out of Food in the Last Year: Never true    Ran Out of Food in the Last Year: Never true  Transportation Needs: No Transportation Needs (04/11/2023)   PRAPARE - Administrator, Civil Service (Medical): No    Lack of Transportation (Non-Medical): No  Physical Activity: Not on file  Stress: Not on file  Social Connections: Unknown (01/16/2023)   Received from [facility]   Social Network    Social Network: Not on file   Additional Social History:                         Sleep: Good  Appetite:  Good  Current Medications: Current Facility-Administered Medications  Medication Dose Route Frequency Provider Last Rate Last Admin   acetaminophen (TYLENOL) tablet 650 mg  650 mg Oral Q6H PRN  Alford Highland, MD   650 mg at 04/11/23 2023   Or   acetaminophen (TYLENOL) suppository 650 mg  650 mg Rectal Q6H PRN Alford Highland, MD       ARIPiprazole (ABILIFY) tablet 2 mg  2 mg Oral QHS Wieting, Alethea Terhaar, MD   2 mg at 04/11/23 2023   cephALEXin (KEFLEX) capsule 500 mg  500 mg Oral QID Alford Highland, MD   500 mg at 04/12/23 0624   enoxaparin (LOVENOX) injection 40 mg  40 mg Subcutaneous Q24H Alford Highland, MD   40 mg at 04/11/23 1442   ferrous sulfate tablet 325 mg  325 mg Oral Q breakfast Alford Highland, MD   325 mg at 04/12/23 0800   FLUoxetine (PROZAC) capsule 40 mg  40 mg Oral Daily Alford Highland, MD   40 mg at 04/12/23 6045   hydrOXYzine (ATARAX) tablet 25 mg  25 mg Oral PRN Alford Highland, MD   25 mg at 04/11/23 2021   ondansetron  (ZOFRAN) tablet 4 mg  4 mg Oral Q6H PRN Alford Highland, MD       Or   ondansetron [facility]) injection 4 mg  4 mg Intravenous Q6H PRN Alford Highland, MD       polyethylene glycol (MIRALAX / GLYCOLAX) packet 17 g  17 g Oral Daily PRN Alford Highland, MD        Lab Results:  Results for orders placed or performed during the hospital encounter of 04/11/23 (from the past 48 hour(s))  CBC     Status: Abnormal   Collection Time: 04/12/23  2:50 AM  Result Value Ref Range   WBC 15.9 (H) 4.0 - 10.5 K/uL   RBC 4.53 4.22 - 5.81 MIL/uL   Hemoglobin 12.3 (L) 13.0 - 17.0 g/dL   HCT 40.9 (L) 81.1 - 91.4 %   MCV 80.1 80.0 - 100.0 fL   MCH 27.2 26.0 - 34.0 pg   MCHC 33.9 30.0 - 36.0 g/dL   RDW 78.2 95.6 - 21.3 %   Platelets 297 150 - 400 K/uL   nRBC 0.0 0.0 - 0.2 %    Comment: Performed at [facility], [address] [place], [place] [address]  Basic metabolic panel     Status: Abnormal   Collection Time: 04/12/23  2:50 AM  Result Value Ref Range   Sodium 138 135 - 145 mmol/L   Potassium 4.3 3.5 - 5.1 mmol/L   Chloride 104 98 - 111 mmol/L   CO2 25 22 - 32 mmol/L   Glucose, Bld 136 (H) 70 - 99 mg/dL    Comment: Glucose reference range applies only to samples taken after fasting for at least 8 hours.   BUN 12 6 - 20 mg/dL   Creatinine, Ser 8.46 0.61 - 1.24 mg/dL   Calcium 9.3 8.9 - 96.2 mg/dL   GFR, Estimated >95 >28 mL/min    Comment: (NOTE) Calculated using the CKD-EPI Creatinine Equation (2021)    Anion gap 9 5 - 15    Comment: Performed at [facility], [address] [place], [place] [address]    Blood Alcohol level:  Lab Results  Component Value Date   [facility] <10 04/06/2023   ETH <10 02/28/2023    Metabolic Disorder Labs: Lab Results  Component Value Date   HGBA1C 5.2 03/03/2023   MPG 102.54 03/03/2023   MPG 96.8 07/07/2021   No results found for: "PROLACTIN" Lab Results  Component Value Date   CHOL 217 (H) 03/03/2023  TRIG 82 03/03/2023   HDL  39 (L) 03/03/2023   CHOLHDL 5.6 03/03/2023   VLDL 16 03/03/2023   LDLCALC 162 (H) 03/03/2023   LDLCALC 138 (H) 07/07/2021    Physical Findings: AIMS:  , ,  ,  ,    CIWA:    COWS:     Musculoskeletal: Strength & Muscle Tone: within normal limits Gait & Station: normal Patient leans: N/A  Psychiatric Specialty Exam:  Presentation  General Appearance:  Appropriate for Environment  Eye Contact: Good  Speech: Normal Rate; Clear and Coherent  Speech Volume: Normal  Handedness: Right   Mood and Affect  Mood: Depressed; Anxious  Affect: Congruent   Thought Process  Thought Processes: Linear; Goal Directed; Coherent  Descriptions of Associations:Intact  Orientation:Full (Time, Place and Person)  Thought Content:Logical  History of Schizophrenia/Schizoaffective disorder:No data recorded Duration of Psychotic Symptoms:No data recorded Hallucinations:No data recorded Ideas of Reference:None  Suicidal Thoughts:No data recorded Homicidal Thoughts:No data recorded  Sensorium  Memory: Immediate Good; Recent Good; Remote Good  Judgment: Poor  Insight: Shallow; Lacking   Executive Functions  Concentration: Good  Attention Span: Good  Recall: Good  Fund of Knowledge: Good  Language: Good   Psychomotor Activity  Psychomotor Activity:No data recorded  Assets  Assets: Housing; Leisure Time; Physical Health; Resilience; Social Support; Talents/Skills; Transportation; Vocational/Educational; Desire for Improvement; Financial Resources/Insurance; Communication Skills   Sleep  Sleep:No data recorded   Physical Exam: Physical Exam Vitals and nursing note reviewed.  Constitutional:      Appearance: Normal appearance. He is normal weight.  Neurological:     General: No focal deficit present.     Mental Status: He is alert and oriented to person, place, and time.  Psychiatric:        Attention and Perception: Attention and perception  normal.        Mood and Affect: Mood and affect normal.        Speech: Speech normal.        Behavior: Behavior normal. Behavior is cooperative.        Thought Content: Thought content normal.        Cognition and Memory: Cognition and memory normal.        Judgment: Judgment normal.    Review of Systems  Constitutional: Negative.   HENT: Negative.    Eyes: Negative.   Respiratory: Negative.    Cardiovascular: Negative.   Gastrointestinal: Negative.   Genitourinary: Negative.   Musculoskeletal: Negative.   Skin: Negative.   Neurological: Negative.   Endo/Heme/Allergies: Negative.   Psychiatric/Behavioral: Negative.     Blood pressure 133/61, pulse 93, temperature 97.9 F (36.6 C), resp. rate 18, SpO2 98%. There is no height or weight on file to calculate BMI.   Treatment Plan Summary: Daily contact with patient to assess and evaluate symptoms and progress in treatment, Medication management, and Plan he signed a voluntary form today and he is going to transfer back to adult psychiatry and stay on one-to-one for now.   Sarina Ill, DO 04/12/2023, 10:38 AM

## 2023-04-12 NOTE — Hospital Course (Signed)
22 y.o. male with medical history significant of depression with cutting himself.  Patient has been on the psychiatric floor for few days.  Medicine consultation was done because he pulled out some sutures and needed to be resutured.  Also patient having trouble urinating and Foley catheter was placed yesterday.  Patient was given gabapentin for anxiety and ended up having a twitching reaction and felt like his skin was burning.  An IV was placed and IV Solu-Medrol, IV Benadryl and Pepcid was given.  Patient was transferred to the medical floor.  I spoke with the patient's mother and she stated that he has a history of these type of reactions.  States he has taken gabapentin before but patient does not recall that.  She also stated that he has had a history of unable to urinate before.  On the medical floor the patient looks much better not twitching anymore.    9/16.  Patient feeling much better.  No complaints of joint pains or muscle aches.  Able to straight leg raise and lift his arms up off the bed.  Will transfer back to psychiatry when bed available. 9/17.  Patient had abdominal pain overnight but this morning was feeling better.  Nighttime physician ordered a CT scan which did not show any hydronephrosis.  Foley catheter removed this morning.  Watch for patient to urinate.

## 2023-04-13 ENCOUNTER — Inpatient Hospital Stay: Payer: BC Managed Care – PPO

## 2023-04-13 DIAGNOSIS — R338 Other retention of urine: Secondary | ICD-10-CM | POA: Diagnosis not present

## 2023-04-13 DIAGNOSIS — T50905S Adverse effect of unspecified drugs, medicaments and biological substances, sequela: Secondary | ICD-10-CM

## 2023-04-13 DIAGNOSIS — K5909 Other constipation: Secondary | ICD-10-CM | POA: Diagnosis not present

## 2023-04-13 DIAGNOSIS — S41112D Laceration without foreign body of left upper arm, subsequent encounter: Secondary | ICD-10-CM | POA: Diagnosis not present

## 2023-04-13 LAB — URINALYSIS, COMPLETE (UACMP) WITH MICROSCOPIC
Bacteria, UA: NONE SEEN
Bilirubin Urine: NEGATIVE
Glucose, UA: NEGATIVE mg/dL
Ketones, ur: NEGATIVE mg/dL
Leukocytes,Ua: NEGATIVE
Nitrite: NEGATIVE
Protein, ur: NEGATIVE mg/dL
Specific Gravity, Urine: 1.018 (ref 1.005–1.030)
Squamous Epithelial / HPF: 0 /HPF (ref 0–5)
pH: 5 (ref 5.0–8.0)

## 2023-04-13 MED ORDER — PANTOPRAZOLE SODIUM 40 MG IV SOLR
40.0000 mg | Freq: Every day | INTRAVENOUS | Status: DC
  Administered 2023-04-13: 40 mg via INTRAVENOUS
  Filled 2023-04-13: qty 10

## 2023-04-13 MED ORDER — IBUPROFEN 400 MG PO TABS
600.0000 mg | ORAL_TABLET | Freq: Once | ORAL | Status: AC
  Administered 2023-04-13: 600 mg via ORAL
  Filled 2023-04-13: qty 2

## 2023-04-13 MED ORDER — KETOROLAC TROMETHAMINE 30 MG/ML IJ SOLN
30.0000 mg | Freq: Once | INTRAMUSCULAR | Status: AC
  Administered 2023-04-13: 30 mg via INTRAVENOUS
  Filled 2023-04-13: qty 1

## 2023-04-13 MED ORDER — IBUPROFEN 400 MG PO TABS: 600.0000 mg | ORAL_TABLET | Freq: Four times a day (QID) | ORAL | Status: DC | PRN

## 2023-04-13 MED ORDER — KETOROLAC TROMETHAMINE 30 MG/ML IJ SOLN
30.0000 mg | Freq: Once | INTRAMUSCULAR | Status: AC | PRN
  Administered 2023-04-13: 30 mg via INTRAVENOUS
  Filled 2023-04-13: qty 1

## 2023-04-13 MED ORDER — POLYETHYLENE GLYCOL 3350 17 G PO PACK
17.0000 g | PACK | Freq: Two times a day (BID) | ORAL | Status: DC
  Administered 2023-04-13 – 2023-04-14 (×3): 17 g via ORAL
  Filled 2023-04-13 (×3): qty 1

## 2023-04-13 MED ORDER — BISACODYL 5 MG PO TBEC
5.0000 mg | DELAYED_RELEASE_TABLET | Freq: Every day | ORAL | Status: DC | PRN
  Administered 2023-04-13: 5 mg via ORAL
  Filled 2023-04-13: qty 1

## 2023-04-13 NOTE — Progress Notes (Signed)
Progress Note   Patient: Vincent Frank WCB:[id number] DOB: [birth year] DOA: 04/11/2023     2 DOS: the patient was seen and examined on 04/13/2023   Brief hospital course: 22 y.o. male with medical history significant of depression with cutting himself.  Patient has been on the psychiatric floor for few days.  Medicine consultation was done because he pulled out some sutures and needed to be resutured.  Also patient having trouble urinating and Foley catheter was placed yesterday.  Patient was given gabapentin for anxiety and ended up having a twitching reaction and felt like his skin was burning.  An IV was placed and IV Solu-Medrol, IV Benadryl and Pepcid was given.  Patient was transferred to the medical floor.  I spoke with the patient's mother and she stated that he has a history of these type of reactions.  States he has taken gabapentin before but patient does not recall that.  She also stated that he has had a history of unable to urinate before.  On the medical floor the patient looks much better not twitching anymore.    9/16.  Patient feeling much better.  No complaints of joint pains or muscle aches.  Able to straight leg raise and lift his arms up off the bed.  Will transfer back to psychiatry when bed available. 9/17.  Patient had abdominal pain overnight but this morning was feeling better.  Nighttime physician ordered a CT scan which did not show any hydronephrosis.  Foley catheter removed this morning.  Watch for patient to urinate.   Assessment and Plan: * Drug reaction This has resolved.  Medically cleared to go to psychiatric floor.  Allergic reaction to gabapentin.  Patient received 1 dose of Solu-Medrol Benadryl and Pepcid.  Also received a dose of cyproheptadine.   Acute urinary retention Foley catheter discontinued today.  Patient has a history of retaining urine with his psychiatric issues.  Arm laceration, left, subsequent encounter Patient was resutured on 9/13.   Continue Keflex.  Remove sutures on 9/23.  No signs of infection.  Constipation Will restart MiraLAX.  MDD (major depressive disorder) Likely has borderline personality also.  Continue Prozac and Abilify.  Discharge to psychiatric floor when I have the staffing to take care of this patient.  Iron deficiency anemia Oral iron.        Subjective: Patient had some abdominal pain overnight.  No pain this morning.  Had some pain after taking a shower today.  CT scan reviewed overnight did not show any hydronephrosis otherwise negative.  Physical Exam: Vitals:   04/12/23 0729 04/12/23 1510 04/13/23 0616 04/13/23 0840  BP: 133/61 135/65 119/68 133/78  Pulse: 93 86 65 79  Resp: 18 18 16 17   Temp: 97.9 F (36.6 C) 98.6 F (37 C) (!) 97.5 F (36.4 C)   TempSrc:  Oral Oral   SpO2: 98% 99% 99% 100%   Physical Exam HENT:     Head: Normocephalic.     Mouth/Throat:     Pharynx: No oropharyngeal exudate.  Eyes:     General: Lids are normal.     Conjunctiva/sclera: Conjunctivae normal.  Cardiovascular:     Rate and Rhythm: Normal rate and regular rhythm.     Heart sounds: Normal heart sounds, S1 normal and S2 normal.  Pulmonary:     Breath sounds: No decreased breath sounds, wheezing, rhonchi or rales.  Abdominal:     Palpations: Abdomen is soft.     Tenderness: There is no abdominal tenderness.  Musculoskeletal:     Right lower leg: No swelling.     Left lower leg: No swelling.  Skin:    General: Skin is warm.     Comments: Left arm covered.  Numerous healing areas of prior cuts.  Neurological:     Mental Status: He is alert and oriented to person, place, and time.     Comments: Able to straight leg raise bilaterally in normal range.  No twitching movements.     Data Reviewed: CT scan overnight negative.  No signs of hydronephrosis.   Disposition: Status is: Inpatient Remains inpatient appropriate because: Psychiatry team to try to figure out staffing on whether they  can take him down to the psychiatric floor.  Medically stable.  Patient will be best served on a psychiatric floor with his issues.  Planned Discharge Destination: Psychiatric floor when bed available    Time spent: 30 minutes Numerous conversations today with staff.  Author: Alford Highland, MD 04/13/2023 12:10 PM  For on call review [email or web address].

## 2023-04-13 NOTE — Progress Notes (Signed)
Per psych team, patient is NOT allowed to have his belongings, as he will be transferred to [facility] when bed is available.
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
## 2023-04-14 DIAGNOSIS — R4588 Nonsuicidal self-harm: Secondary | ICD-10-CM

## 2023-04-14 DIAGNOSIS — S41119A Laceration without foreign body of unspecified upper arm, initial encounter: Secondary | ICD-10-CM

## 2023-04-14 DIAGNOSIS — Z7289 Other problems related to lifestyle: Secondary | ICD-10-CM

## 2023-04-14 DIAGNOSIS — F329 Major depressive disorder, single episode, unspecified: Secondary | ICD-10-CM

## 2023-04-14 DIAGNOSIS — T50905A Adverse effect of unspecified drugs, medicaments and biological substances, initial encounter: Secondary | ICD-10-CM

## 2023-04-14 DIAGNOSIS — R338 Other retention of urine: Secondary | ICD-10-CM | POA: Diagnosis not present

## 2023-04-14 DIAGNOSIS — F3341 Major depressive disorder, recurrent, in partial remission: Secondary | ICD-10-CM

## 2023-04-14 LAB — SARS CORONAVIRUS 2 BY RT PCR: SARS Coronavirus 2 by RT PCR: NEGATIVE

## 2023-04-14 MED ORDER — CEPHALEXIN 500 MG PO CAPS: 500.0000 mg | ORAL_CAPSULE | Freq: Four times a day (QID) | ORAL | 0 refills | Status: AC

## 2023-04-14 MED ORDER — PANTOPRAZOLE SODIUM 40 MG PO TBEC: 40.0000 mg | DELAYED_RELEASE_TABLET | Freq: Every day | ORAL | Status: DC

## 2023-04-14 MED ORDER — BISACODYL 5 MG PO TBEC
10.0000 mg | DELAYED_RELEASE_TABLET | Freq: Every day | ORAL | Status: DC
  Administered 2023-04-14: 10 mg via ORAL
  Filled 2023-04-14: qty 2

## 2023-04-14 NOTE — Progress Notes (Signed)
PHARMACIST - PHYSICIAN COMMUNICATION  DR:   Allena Katz  CONCERNING: IV to Oral Route Change Policy  RECOMMENDATION: This patient is receiving pantoprazole by the intravenous route.  Based on criteria approved by the Pharmacy and Therapeutics Committee, the intravenous medication(s) is/are being converted to the equivalent oral dose form(s).   DESCRIPTION: These criteria include: The patient is eating (either orally or via tube) and/or has been taking other orally administered medications for a least 24 hours The patient has no evidence of active gastrointestinal bleeding or impaired GI absorption (gastrectomy, short bowel, patient on TNA or NPO).  If you have questions about this conversion, please contact the Pharmacy Department  []   [phone] )  [facility] [x]   [phone] )  [facility] []   [phone] )  [facility] []   [phone] )  [facility] []   [phone] )  [facility]   Elliot Gurney, PharmD, BCPS Clinical Pharmacist  04/14/2023 7:52 AM

## 2023-04-14 NOTE — Progress Notes (Addendum)
[facility]  - [place] [facility]   PATIENT NAME: Vincent Frank    MR#:  [id number]  DATE OF BIRTH:  Dec 02, 2000  SUBJECTIVE:  patient seen earlier. Sitter at bedside. Overall doing well. Slept good. Urinating okay. Catheter removed yesterday. Denies any other issues.    VITALS:  Blood pressure 121/74, pulse 75, temperature 97.8 F (36.6 C), resp. rate 17, SpO2 99%.  PHYSICAL EXAMINATION:   GENERAL:  22 y.o.-year-old patient with no acute distress.  LUNGS: Normal breath sounds bilaterally, no wheezing CARDIOVASCULAR: S1, S2 normal. No murmur   ABDOMEN: Soft, nontender, nondistended. Bowel sounds present.  EXTREMITIES: No  edema b/l. Laceration/cut marks on the left arm. Sutures site looks stable. NEUROLOGIC: nonfocal  patient is alert and awake SKIN: as above  LABORATORY PANEL:  CBC Recent Labs  Lab 04/12/23 0250  WBC 15.9*  HGB 12.3*  HCT 36.3*  PLT 297    Chemistries  Recent Labs  Lab 04/12/23 0250  NA 138  K 4.3  CL 104  CO2 25  GLUCOSE 136*  BUN 12  CREATININE 1.03  CALCIUM 9.3   Cardiac Enzymes No results for input(s): "TROPONINI" in the last 168 hours. RADIOLOGY:  CT ABDOMEN PELVIS WO CONTRAST  Result Date: 04/13/2023 CLINICAL DATA:  Left upper quadrant pain EXAM: CT ABDOMEN AND PELVIS WITHOUT CONTRAST TECHNIQUE: Multidetector CT imaging of the abdomen and pelvis was performed following the standard protocol without IV contrast. RADIATION DOSE REDUCTION: This exam was performed according to the departmental dose-optimization program which includes automated exposure control, adjustment of the mA and/or kV according to patient size and/or use of iterative reconstruction technique. COMPARISON:  Six days prior FINDINGS: Lower chest: Trace opacification in the lower lungs, linear and ground-glass on the left and linear on the right, most consistent with atelectasis. Trace bilateral pleural fluid in the posterior costophrenic sulci.  Hepatobiliary: No focal liver abnormality.No evidence of biliary obstruction or stone. Pancreas: Unremarkable. Spleen: Unremarkable. Adrenals/Urinary Tract: Negative adrenals. No hydronephrosis or stone. Collapsed urinary bladder around a Foley catheter. Stomach/Bowel:  No obstruction. No visible bowel inflammation. Vascular/Lymphatic: No acute vascular abnormality. No mass or adenopathy. Reproductive:No pathologic findings. Other: No ascites or pneumoperitoneum. Musculoskeletal: No acute abnormalities. IMPRESSION: No intra-abdominal cause for symptoms. Trace atelectasis at the lung bases. Electronically Signed   By: Tiburcio Pea M.D.   On: 04/13/2023 04:06    Assessment and Plan 22 y.o. male with medical history significant of depression with cutting himself.  Patient has been on the psychiatric floor for few days.  Medicine consultation was done because he pulled out some sutures and needed to be resutured.  Also patient having trouble urinating and Foley catheter was placed yesterday.  Patient was given gabapentin for anxiety and ended up having a twitching reaction and felt like his skin was burning   9/18-- patient overall feeling okay. Able to avoid without any difficulty. Medically at present stable. I have message psychiatry MD and NP to transfer patient down to [facility] or let me know the plan.  Drug reaction --This has resolved.  Medically cleared to go to psychiatric floor.  Allergic reaction to gabapentin.  Patient received 1 dose of Solu-Medrol Benadryl and Pepcid.  Also received a dose of cyproheptadine.    Acute urinary retention Foley catheter discontinued today.  Patient has a history of retaining urine with his psychiatric issues. Per patient able to void. Bladder scan negative for retention   Arm laceration, left, subsequent encounter --Patient was resutured on  9/13.  Continue Keflex.  Remove sutures on 9/23.  No signs of infection. -- Patient empirically on antibiotic    Constipation --Will restart MiraLAX.   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  Continue Prozac and Abilify.  Discharge to psychiatric floor when I have the staffing to take care of this patient.   Iron deficiency anemia --Oral iron.  Addendum: In discussion with secure chat with couple psych nurse practitioner, Dr. per Loraine Leriche, one C unit director, assistant director, social worker it was determined patient needs to be involuntary committed given his high risk of suicide attempt. Psych nurse practitioner to work on [employer]. Per psych NP [place] Neighbor-- Dr.Parmar will be rounding on patient. Per BHU placement RN no bed available at present.  Family communication : none Consults : psych CODE STATUS: full DVT Prophylaxis : ambulatory, Lovenox Level of care: Med-Surg Status is: Inpatient Remains inpatient appropriate because: awaiting behavioral medicine MD/psychiatrist to let me know the plan. Patient is medically stable.  TOTAL TIME TAKING CARE OF THIS PATIENT: 35 minutes.  >50% time spent on counselling and coordination of care  Note: This dictation was prepared with Dragon dictation along with smaller phrase technology. Any transcriptional errors that result from this process are unintentional.  Enedina Finner M.D    [facility]   CC: Primary care physician; Patient, No Pcp Per

## 2023-04-14 NOTE — TOC Transition Note (Addendum)
Transition of Care [facility]) - CM/SW Discharge Note   Patient Details  Name: MACDONALD RAZZAK MRN: [id number] Date of Birth: 2001-06-05  [facility]) CM/SW Contact:  Allena Katz, LCSW Phone Number: 04/14/2023, 3:22 PM   Clinical Narrative:   Pt accepted at [facility] pending his negative covid test. [employer] department to transport. Number for report given to RN. CSW has faxed covid results and dc summary once in.    Fax [phone] Report [phone] or [phone].  [facility] by Dr. Forrestine Him.   Final next level of care: Psychiatric Hospital Barriers to Discharge: Continued Medical Work up   Patient Goals and CMS Choice      Discharge Placement                         Discharge Plan and Services Additional resources added to the After Visit Summary for                                       Social Determinants of Health (SDOH) Interventions SDOH Screenings   Food Insecurity: No Food Insecurity (04/11/2023)  Housing: Low Risk  (04/11/2023)  Transportation Needs: No Transportation Needs (04/11/2023)  Utilities: Not At Risk (04/11/2023)  Alcohol Screen: Low Risk  (04/07/2023)  Social Connections: Unknown (01/16/2023)   Received from [facility]  Tobacco Use: Low Risk  (04/11/2023)     Readmission Risk Interventions     No data to display
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
## 2023-04-14 NOTE — Consult Note (Cosign Needed Addendum)
[facility] Face-to-Face Psychiatry Consult   Reason for Consult:  Psychiatric Evaluation Referring Physician:  Dr. Gean Maidens Patient Identification: Vincent Frank MRN:  [id number] Principal Diagnosis: Drug reaction Diagnosis:  Principal Problem:   Drug reaction Active Problems:   Acute urinary retention   MDD (major depressive disorder)   Arm laceration, left, subsequent encounter   Constipation   Allergic reaction   Iron deficiency anemia   Self-injurious behavior   Total Time spent with patient: 45 minutes  Subjective:   Vincent Frank is a 22 y.o. male patient seen for psychiatric evaluation to determine inpatient need. Patient reports "I got overwhelmed by my brother at home and got a knife from the kitchen".  HPI:  Vincent Frank is a 22 y.o. male patient seen for psychiatric evaluation to determine psychiatric inpatient need.Patient reports "I want to go home. They have removed all of the knives and drugs so I can come home". (Patient referring to his family). Patient reports that he became overwhelmed by his brother at home, causing him to go in the kitchen, get a knife and cut his forearm x 2. (Per MD noted on 04/11/2023, patient cut himself due to work related problems). Patient reports that he did not think about death when cutting his arm, stating "I said oh I messed up". Patient lacking insight and continued not to acknowledge that his SIB could have been detrimental. Patient showed his left forearm and pointed out four injuries that he reports self injuring on two separate days, starting approx 15 days ago. Per his record, SIB reported on 03/16/2023 and active problem of history of fictitious disorder noted. Tourniquet required to control bleeding upon presenting to ED on 03/16/23. Patient asked if he has self injured anywhere else on his body. He reports "no", stating the self inflicted lacerations on his left forearm are the only self inflicted wounds in his history.  Per his noted on 03/16/23, patient had self inflicted wounds on bilateral shins. Patient reports that stressed caused him to self injure both times. Patient has a psychiatric history of Severe MDD, anxiety, and suicide attempt, per his record. He had a recent psychiatric hospitalization, discharging on 03/20/23 and readmitting to psychiatry inpatient on 04/08/2023. Patient has continued to remain inpatient status since his initial recommendation for inpatient psychiatry on 04/07/2023. During this inpatient psychiatric hospitalization, patient removed his sutures and was placed on 1:1 staff supervision approximately 5 days ago. Per 04/12/2023 (2 days ago) MD note, Dr. Marlou Porch recommended for patient to remain inpatient until "he has a few days of good behavior without a one-to-one". Dr. Marval Regal consulted for IVC renewal, as Medical floor attending ED and patient reports his desire to discharge.  Past Psychiatric History: MDD, anxiety  Risk to Self:  denies Risk to Others:  denies Prior Inpatient Therapy:  Yes. See above Prior Outpatient Therapy:  [facility]  Past Medical History:  Past Medical History:  Diagnosis Date   Major depressive disorder     Past Surgical History:  Procedure Laterality Date   NO PAST SURGERIES     Family History:  Family History  Problem Relation Age of Onset   Healthy Mother    Family Psychiatric  History: none reported Social History:  Social History   Substance and Sexual Activity  Alcohol Use Never     Social History   Substance and Sexual Activity  Drug Use Never    Social History   Socioeconomic History   Marital status: Single  Spouse name: Not on file   Number of children: Not on file   Years of education: Not on file   Highest education level: Not on file  Occupational History   Not on file  Tobacco Use   Smoking status: Never   Smokeless tobacco: Never  Vaping Use   Vaping status: Never Used  Substance and Sexual  Activity   Alcohol use: Never   Drug use: Never   Sexual activity: Not Currently  Other Topics Concern   Not on file  Social History Narrative   Not on file   Social Determinants of Health   Financial Resource Strain: Not on file  Food Insecurity: No Food Insecurity (04/11/2023)   Hunger Vital Sign    Worried About Running Out of Food in the Last Year: Never true    Ran Out of Food in the Last Year: Never true  Transportation Needs: No Transportation Needs (04/11/2023)   PRAPARE - Administrator, Civil Service (Medical): No    Lack of Transportation (Non-Medical): No  Physical Activity: Not on file  Stress: Not on file  Social Connections: Unknown (01/16/2023)   Received from [facility]   Social Network    Social Network: Not on file   Additional Social History:    Allergies:   Allergies  Allergen Reactions   Bee Venom Anaphylaxis   Hornet Venom Anaphylaxis and Itching   Gabapentin     twitching   Haldol [Haloperidol] Other (See Comments)    Per father dystonic reaction with  muscle contractures.    Labs:  Results for orders placed or performed during the hospital encounter of 04/11/23 (from the past 48 hour(s))  Urinalysis, Complete w Microscopic -Urine, Catheterized     Status: Abnormal   Collection Time: 04/13/23  1:30 AM  Result Value Ref Range   Color, Urine YELLOW (A) YELLOW   APPearance CLEAR (A) CLEAR   Specific Gravity, Urine 1.018 1.005 - 1.030   pH 5.0 5.0 - 8.0   Glucose, UA NEGATIVE NEGATIVE mg/dL   Hgb urine dipstick SMALL (A) NEGATIVE   Bilirubin Urine NEGATIVE NEGATIVE   Ketones, ur NEGATIVE NEGATIVE mg/dL   Protein, ur NEGATIVE NEGATIVE mg/dL   Nitrite NEGATIVE NEGATIVE   Leukocytes,Ua NEGATIVE NEGATIVE   RBC / HPF 6-10 0 - 5 RBC/hpf   WBC, UA 0-5 0 - 5 WBC/hpf   Bacteria, UA NONE SEEN NONE SEEN   Squamous Epithelial / HPF 0 0 - 5 /HPF   Mucus PRESENT     Comment: Performed at [facility], [address]  [place], [place] [address]    Current Facility-Administered Medications  Medication Dose Route Frequency Provider Last Rate Last Admin   acetaminophen (TYLENOL) tablet 650 mg  650 mg Oral Q6H PRN Alford Highland, MD   650 mg at 04/13/23 1532   Or   acetaminophen (TYLENOL) suppository 650 mg  650 mg Rectal Q6H PRN Alford Highland, MD       ARIPiprazole (ABILIFY) tablet 2 mg  2 mg Oral QHS Wieting, Richard, MD   2 mg at 04/13/23 2054   bisacodyl (DULCOLAX) EC tablet 10 mg  10 mg Oral Daily Enedina Finner, MD   10 mg at 04/14/23 1018   cephALEXin (KEFLEX) capsule 500 mg  500 mg Oral QID Cordella Register A, RPH   500 mg at 04/14/23 1018   enoxaparin (LOVENOX) injection 40 mg  40 mg Subcutaneous Q24H Alford Highland, MD   40 mg at  04/13/23 1532   ferrous sulfate tablet 325 mg  325 mg Oral Q breakfast Alford Highland, MD   325 mg at 04/14/23 1018   FLUoxetine (PROZAC) capsule 40 mg  40 mg Oral Daily Alford Highland, MD   40 mg at 04/14/23 1018   hydrOXYzine (ATARAX) tablet 25 mg  25 mg Oral PRN Alford Highland, MD   25 mg at 04/11/23 2021   ibuprofen (ADVIL) tablet 600 mg  600 mg Oral Q6H PRN Andris Baumann, MD       ondansetron [facility]) tablet 4 mg  4 mg Oral Q6H PRN Alford Highland, MD       Or   ondansetron [facility]) injection 4 mg  4 mg Intravenous Q6H PRN Alford Highland, MD   4 mg at 04/13/23 2158   pantoprazole (PROTONIX) EC tablet 40 mg  40 mg Oral QHS Cordella Register A, RPH       polyethylene glycol (MIRALAX / GLYCOLAX) packet 17 g  17 g Oral BID Alford Highland, MD   17 g at 04/14/23 1017    Musculoskeletal: Strength & Muscle Tone: within normal limits Gait & Station: normal Patient leans: N/A            Psychiatric Specialty Exam:  Presentation  General Appearance:  Appropriate for Environment  Eye Contact: Good  Speech: Clear and Coherent  Speech Volume: Normal  Handedness: Right   Mood and Affect  Mood: Depressed  Affect: Congruent; Depressed  (Patient appeared to get choked up when sharing that his family needs him at home.)   Thought Process  Thought Processes: Goal Directed  Descriptions of Associations:Intact  Orientation:Full (Time, Place and Person)  Thought Content:Logical  History of Schizophrenia/Schizoaffective disorder:No data recorded Duration of Psychotic Symptoms:No data recorded Hallucinations:Hallucinations: None  Ideas of Reference:None  Suicidal Thoughts:Suicidal Thoughts: -- (denies)  Homicidal Thoughts:Homicidal Thoughts: No   Sensorium  Memory: Immediate Fair; Recent Fair  Judgment: Poor  Insight: Lacking   Executive Functions  Concentration: Good  Attention Span: Good  Recall: Fair  Fund of Knowledge: Good  Language: Good   Psychomotor Activity  Psychomotor Activity:Psychomotor Activity: Normal   Assets  Assets: Communication Skills; Housing   Sleep  Sleep:No data recorded  Physical Exam: Physical Exam Vitals and nursing note reviewed.  Neurological:     Mental Status: He is alert and oriented to person, place, and time.    Review of Systems  Psychiatric/Behavioral:  Positive for depression.   All other systems reviewed and are negative.  Blood pressure 121/74, pulse 75, temperature 97.8 F (36.6 C), resp. rate 17, SpO2 99%. There is no height or weight on file to calculate BMI.  Treatment Plan Summary: Patient with inconsistencies in his story. He appears depressed with congruent mood. Patient has multiple presentations to emergent psychiatric services for SIB. Patient evaluated last on 04/12/2023 by Dr. Marlou Porch and was recommended to remain inpatient for continued treatment and observation. This Clinical research associate agrees with Dr. Marlou Porch. Patient continues to meet psychiatric inpatient criteria due to safety concerns.  Disposition: Recommend psychiatric Inpatient admission when medically cleared.  Mcneil Sober, NP 04/14/2023 2:59 PM

## 2023-04-14 NOTE — TOC Initial Note (Signed)
Transition of Care [facility]) - Initial/Assessment Note    Patient Details  Name: Vincent Frank MRN: [id number] Date of Birth: 12-May-2001  [facility] CM/SW Contact:    Allena Katz, LCSW Phone Number: 04/14/2023, 1:46 PM  Clinical Narrative:  Referrals faxed out to inpatient psych for review. NP to renew IVC for patient.                        Patient Goals and CMS Choice            Expected Discharge Plan and Services         Expected Discharge Date: 04/12/23                                    Prior Living Arrangements/Services                       Activities of Daily Living Home Assistive Devices/Equipment: None ADL Screening (condition at time of admission) Patient's cognitive ability adequate to safely complete daily activities?: Yes Is the patient deaf or have difficulty hearing?: No Does the patient have difficulty seeing, even when wearing glasses/contacts?: No Does the patient have difficulty concentrating, remembering, or making decisions?: No Patient able to express need for assistance with ADLs?: Yes Does the patient have difficulty dressing or bathing?: No Independently performs ADLs?: Yes (appropriate for developmental age) Does the patient have difficulty walking or climbing stairs?: No Weakness of Legs: None Weakness of Arms/Hands: None  Permission Sought/Granted                  Emotional Assessment              Admission diagnosis:  Drug reaction [T50.905A] Patient Active Problem List   Diagnosis Date Noted   Iron deficiency anemia 04/12/2023   Anemia, unspecified 04/11/2023   Drug reaction 04/11/2023   Allergic reaction 04/11/2023   Constipation 04/10/2023   Arm laceration, left, subsequent encounter 04/09/2023   MDD (major depressive disorder) 04/07/2023   MDD (major depressive disorder), recurrent episode, severe (HCC) 03/16/2023   Suicide attempt by cutting of wrist (HCC)  02/28/2023   Major depressive disorder, recurrent severe without psychotic features (HCC) 02/28/2023   Oppositional defiant disorder 11/28/2020   MDD (major depressive disorder), recurrent severe, without psychosis (HCC) 11/11/2020   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 [place] [address] Phone: [phone] Fax: [phone]     Social Determinants of Health (SDOH) Social History: SDOH Screenings   Food Insecurity: No Food Insecurity (04/11/2023)  Housing: Low Risk  (04/11/2023)  Transportation Needs: No Transportation Needs (04/11/2023)  Utilities: Not At Risk (04/11/2023)  Alcohol Screen: Low Risk  (04/07/2023)  Social Connections: Unknown (01/16/2023)   Received from [facility]  Tobacco Use: Low Risk  (04/11/2023)   SDOH Interventions:     Readmission Risk Interventions     No data to display

## 2023-04-15 ENCOUNTER — Encounter: Payer: Self-pay | Admitting: Psychiatry

## 2023-05-04 ENCOUNTER — Emergency Department (HOSPITAL_COMMUNITY): Payer: BC Managed Care – PPO

## 2023-05-04 ENCOUNTER — Emergency Department (HOSPITAL_COMMUNITY)
Admission: EM | Admit: 2023-05-04 | Discharge: 2023-05-04 | Disposition: A | Discharge status: Home / Self Care | Payer: BC Managed Care – PPO | Attending: Emergency Medicine | Admitting: Emergency Medicine

## 2023-05-04 DIAGNOSIS — R569 Unspecified convulsions: Secondary | ICD-10-CM | POA: Insufficient documentation

## 2023-05-04 DIAGNOSIS — F32A Depression, unspecified: Secondary | ICD-10-CM | POA: Diagnosis present

## 2023-05-04 DIAGNOSIS — F3341 Major depressive disorder, recurrent, in partial remission: Secondary | ICD-10-CM | POA: Diagnosis present

## 2023-05-04 LAB — CBC WITH DIFFERENTIAL/PLATELET
Abs Immature Granulocytes: 0.02 10*3/uL (ref 0.00–0.07)
Basophils Absolute: 0.1 10*3/uL (ref 0.0–0.1)
Basophils Relative: 1 %
Eosinophils Absolute: 0.3 10*3/uL (ref 0.0–0.5)
Eosinophils Relative: 4 %
HCT: 40.5 % (ref 39.0–52.0)
Hemoglobin: 13.1 g/dL (ref 13.0–17.0)
Immature Granulocytes: 0 %
Lymphocytes Relative: 17 %
Lymphs Abs: 1.1 10*3/uL (ref 0.7–4.0)
MCH: 26.5 pg (ref 26.0–34.0)
MCHC: 32.3 g/dL (ref 30.0–36.0)
MCV: 82 fL (ref 80.0–100.0)
Monocytes Absolute: 0.6 10*3/uL (ref 0.1–1.0)
Monocytes Relative: 9 %
Neutro Abs: 4.6 10*3/uL (ref 1.7–7.7)
Neutrophils Relative %: 69 %
Platelets: 257 10*3/uL (ref 150–400)
RBC: 4.94 MIL/uL (ref 4.22–5.81)
RDW: 12.5 % (ref 11.5–15.5)
WBC: 6.7 10*3/uL (ref 4.0–10.5)
nRBC: 0 % (ref 0.0–0.2)

## 2023-05-04 LAB — RAPID URINE DRUG SCREEN, HOSP PERFORMED
Amphetamines: NOT DETECTED
Barbiturates: NOT DETECTED
Benzodiazepines: NOT DETECTED
Cocaine: NOT DETECTED
Opiates: NOT DETECTED
Tetrahydrocannabinol: NOT DETECTED

## 2023-05-04 LAB — COMPREHENSIVE METABOLIC PANEL
ALT: 21 U/L (ref 0–44)
AST: 21 U/L (ref 15–41)
Albumin: 4.1 g/dL (ref 3.5–5.0)
Alkaline Phosphatase: 70 U/L (ref 38–126)
Anion gap: 16 — ABNORMAL HIGH (ref 5–15)
BUN: 11 mg/dL (ref 6–20)
CO2: 21 mmol/L — ABNORMAL LOW (ref 22–32)
Calcium: 9.8 mg/dL (ref 8.9–10.3)
Chloride: 103 mmol/L (ref 98–111)
Creatinine, Ser: 1.2 mg/dL (ref 0.61–1.24)
GFR, Estimated: >60 mL/min (ref >60)
Glucose, Bld: 91 mg/dL (ref 70–99)
Potassium: 4.3 mmol/L (ref 3.5–5.1)
Sodium: 140 mmol/L (ref 135–145)
Total Bilirubin: 0.2 mg/dL — ABNORMAL LOW (ref 0.3–1.2)
Total Protein: 7 g/dL (ref 6.5–8.1)

## 2023-05-04 LAB — CBG MONITORING, ED: Glucose-Capillary: 82 mg/dL (ref 70–99)

## 2023-05-04 LAB — URINALYSIS, ROUTINE W REFLEX MICROSCOPIC
Bilirubin Urine: NEGATIVE
Glucose, UA: NEGATIVE mg/dL
Hgb urine dipstick: NEGATIVE
Ketones, ur: NEGATIVE mg/dL
Leukocytes,Ua: NEGATIVE
Nitrite: NEGATIVE
Protein, ur: NEGATIVE mg/dL
Specific Gravity, Urine: 1.013 (ref 1.005–1.030)
pH: 7 (ref 5.0–8.0)

## 2023-05-04 LAB — MAGNESIUM: Magnesium: 2 mg/dL (ref 1.7–2.4)

## 2023-05-04 LAB — ETHANOL: Alcohol, Ethyl (B): <10 mg/dL (ref <10)

## 2023-05-04 MED ORDER — LORAZEPAM 2 MG/ML IJ SOLN
1.0000 mg | Freq: Once | INTRAMUSCULAR | Status: AC
  Administered 2023-05-04: 1 mg via INTRAVENOUS
  Filled 2023-05-04: qty 1

## 2023-05-04 MED ORDER — FLUOXETINE HCL 20 MG PO CAPS
40.0000 mg | ORAL_CAPSULE | Freq: Every day | ORAL | Status: DC
  Administered 2023-05-04: 40 mg via ORAL
  Filled 2023-05-04: qty 2

## 2023-05-04 MED ORDER — SODIUM CHLORIDE 0.9 % IV BOLUS
1000.0000 mL | Freq: Once | INTRAVENOUS | Status: AC
  Administered 2023-05-04: 1000 mL via INTRAVENOUS

## 2023-05-04 MED ORDER — LEVETIRACETAM IN NACL 1000 MG/100ML IV SOLN
1000.0000 mg | Freq: Two times a day (BID) | INTRAVENOUS | Status: DC
  Administered 2023-05-04: 1000 mg via INTRAVENOUS
  Filled 2023-05-04: qty 100

## 2023-05-04 MED ORDER — ARIPIPRAZOLE 2 MG PO TABS
2.0000 mg | ORAL_TABLET | Freq: Every day | ORAL | Status: DC
  Administered 2023-05-04: 2 mg via ORAL
  Filled 2023-05-04 (×2): qty 1

## 2023-05-04 MED ORDER — ONDANSETRON HCL 4 MG/2ML IJ SOLN
4.0000 mg | Freq: Once | INTRAMUSCULAR | Status: AC
  Administered 2023-05-04: 4 mg via INTRAVENOUS
  Filled 2023-05-04: qty 2

## 2023-05-04 MED ORDER — HYDROXYZINE HCL 25 MG PO TABS: 25.0000 mg | ORAL_TABLET | Freq: Four times a day (QID) | ORAL | Status: DC | PRN

## 2023-05-04 MED ORDER — ACETAMINOPHEN 325 MG PO TABS: 650.0000 mg | ORAL_TABLET | Freq: Four times a day (QID) | ORAL | Status: DC | PRN

## 2023-05-04 MED ORDER — LEVETIRACETAM 500 MG PO TABS
500.0000 mg | ORAL_TABLET | Freq: Two times a day (BID) | ORAL | Status: DC
  Administered 2023-05-04: 500 mg via ORAL
  Filled 2023-05-04: qty 1

## 2023-05-04 MED ORDER — TRAZODONE HCL 50 MG PO TABS
50.0000 mg | ORAL_TABLET | Freq: Every day | ORAL | Status: DC
  Filled 2023-05-04: qty 1

## 2023-05-04 NOTE — Progress Notes (Addendum)
[facility] Consult Note  Patient Name: Vincent Frank MRN: [id number] DOB: August 21, 2000 DATE OF Consult: 05/04/2023  PRIMARY PSYCHIATRIC DIAGNOSES  1.  Major Depression, Recurrent, in Partial Remission 2.  Tonic-Clonic Seizure Disorder   RECOMMENDATIONS  Recommendations: Medication recommendations: Recommend that he continue his current outpatient medications, which are Abilify 2 mg at bedtime for mood control; Prozac, 40 mg at bedtime for depression/anxiety; hydroxyzine 25 mg q6h PRN anxiety.  Patient has supply of meds at home. Non-Medication/therapeutic recommendations: Continue with outpatient medication provider, whom he saw earlier today.  Recommend also reconnecting with therapist, given some of the life changes that he is undergoing Is inpatient psychiatric hospitalization recommended for this patient? No (Explain why): Patient without any suicidal/homicidal ideation, and no thoughts of self-cutting.  Feels that recent hospitalization did help to stabilize his moods. From a psychiatric perspective, is this patient appropriate for discharge to an outpatient setting/resource or other less restrictive environment for continued care?  Yes (Explain why): Patient will continue with his medication provider, and as above, recommended that he also seek psychotherapy consultation. Follow-Up Telepsychiatry C/L services: We will sign off for now. Please re-consult our service if needed for any concerning changes in the patient's condition, discharge planning, or questions. Communication: Treatment team members (and family members if applicable) who were involved in treatment/care discussions and planning, and with whom we spoke or engaged with via secure text/chat, include the following: Shared above with Dr. Blinda Leatherwood and ED staff via secure message  Thank you for involving [place] in the care of this patient. If you have any additional questions or concerns, please call [phone] and ask for  the provider on-call.   TELEPSYCHIATRY ATTESTATION & CONSENT   As the provider for this telehealth consult, I attest that I verified the patient's identity using two separate identifiers, introduced myself to the patient, provided my credentials, disclosed my location, and performed this encounter via a HIPAA-compliant, real-time, face-to-face, two-way, interactive audio and video platform and with the full consent and agreement of the patient (or guardian as applicable.)   Patient physical location: [facility]. Telehealth provider physical location: home office in state of [place].  Video start time: 2305h EDT  Video end time: 2325h EDT   Total time spent in this encounter was 30 minutes, including record review, clinical interview, behavior observations, discussion of impressions and recommendations (including medications and hospitalization), and consultation/communication with relevant parties   IDENTIFYING DATA  Vincent Frank is a 22 y.o. year-old male for whom a psychiatric consultation has been ordered by the primary provider. The patient was identified using two separate identifiers.  CHIEF COMPLAINT/REASON FOR CONSULT   I'm adjusting to getting my seizures under control.  My depression is a lot better, and I don't want to hurt myself.   HISTORY OF PRESENT ILLNESS (HPI)  The patient reports that he has been struggling with recurrent depression since his early teens, especially since problems began to arise between his older brother (4 years his senior) and his parents concerning the brother's drug usage.  Patient reports that his moods have been especially unstable this year, as he is facing a lot of life-stage challenges, including job change, the illness of his dog, which he has had since he was age 85,  his brother's drug problems, and tense interactions with his parents.  Began to feel overwhelmed by his emotions, and in August 2024 he was hospitalized at [facility] for  self-harm behaviors (cutting).  He went home, but had exacerbation  of his sx's, and in early September 2024 he was taken to [facility] in [place].  He felt that he stabilized more readily the second time, and he was discharged on low dose Abilify and also Prozac.  Has been taking his meds, and he has had no suicidal/homicidal ideation, no psychotic sx's, and no urge to self-harm.  Patient saw a medication provider today, and will that provider again in two weeks..  Is not currently seeing a therapist, but is considering seeing one again, given the various life changes (started a new job, which he likes, e.g.)  Patient came in to ED today because of recurrence of seizures, which he has had since age 71.  Started back on Keppra, and psych consultation requested to monitor patient's current status. Marland Kitchen  PAST PSYCHIATRIC HISTORY   Otherwise as per HPI above.  PAST MEDICAL HISTORY  Past Medical History:  Diagnosis Date   Major depressive disorder      HOME MEDICATIONS  Facility Ordered Medications  Medication   [COMPLETED] LORazepam (ATIVAN) injection 1 mg   [COMPLETED] sodium chloride 0.9 % bolus 1,000 mL   [COMPLETED] ondansetron (ZOFRAN) injection 4 mg   acetaminophen (TYLENOL) tablet 650 mg   ARIPiprazole (ABILIFY) tablet 2 mg   FLUoxetine (PROZAC) capsule 40 mg   levETIRAcetam (KEPPRA) tablet 500 mg   hydrOXYzine (ATARAX) tablet 25 mg   traZODone (DESYREL) tablet 50 mg   PTA Medications  Medication Sig   ARIPiprazole (ABILIFY) 2 MG tablet Take 1 tablet (2 mg total) by mouth at bedtime. Antidepressant augmentation   FLUoxetine (PROZAC) 40 MG capsule Take 1 capsule (40 mg total) by mouth at bedtime. For depression   hydrOXYzine (ATARAX) 25 MG tablet Take 1 tablet (25 mg total) by mouth every 6 (six) hours as needed for anxiety. (Patient taking differently: Take 25 mg by mouth as needed for anxiety.)   acetaminophen (TYLENOL) 325 MG tablet Take 2 tablets (650 mg total) by mouth every  6 (six) hours as needed for mild pain (or Fever >/= 101).   ferrous sulfate 325 (65 FE) MG tablet Take 1 tablet (325 mg total) by mouth daily with breakfast.   polyethylene glycol (MIRALAX / GLYCOLAX) 17 g packet Take 17 g by mouth daily as needed for mild constipation.   levETIRAcetam (KEPPRA) 500 MG tablet Take 500 mg by mouth 2 (two) times daily.   fenofibrate (TRICOR) 145 MG tablet Take 145 mg by mouth daily.     ALLERGIES  Allergies  Allergen Reactions   Bee Venom Anaphylaxis   Hornet Venom Anaphylaxis and Itching   Gabapentin     twitching   Haldol [Haloperidol] Other (See Comments)    Per father dystonic reaction with  muscle contractures.    SOCIAL & SUBSTANCE USE HISTORY  Social History   Socioeconomic History   Marital status: Single    Spouse name: Not on file   Number of children: Not on file   Years of education: Not on file   Highest education level: Not on file  Occupational History   Not on file  Tobacco Use   Smoking status: Never   Smokeless tobacco: Never  Vaping Use   Vaping status: Never Used  Substance and Sexual Activity   Alcohol use: Never   Drug use: Never   Sexual activity: Not Currently  Other Topics Concern   Not on file  Social History Narrative   Not on file   Social Determinants of Health   Financial Resource  Strain: Not on file  Food Insecurity: No Food Insecurity (04/11/2023)   Hunger Vital Sign    Worried About Running Out of Food in the Last Year: Never true    Ran Out of Food in the Last Year: Never true  Transportation Needs: No Transportation Needs (04/11/2023)   PRAPARE - Administrator, Civil Service (Medical): No    Lack of Transportation (Non-Medical): No  Physical Activity: Not on file  Stress: Not on file  Social Connections: Unknown (01/16/2023)   Received from [facility]   Social Network    Social Network: Not on file   Social History   Tobacco Use  Smoking Status Never  Smokeless Tobacco Never    Social History   Substance and Sexual Activity  Alcohol Use Never   Social History   Substance and Sexual Activity  Drug Use Never    Additional pertinent information 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  FAMILY HISTORY  Family History  Problem Relation Age of Onset   Healthy Mother    Family Psychiatric History (if known):  Did not report  MENTAL STATUS EXAM (MSE)  Presentation  General Appearance:  Well Groomed; Appropriate for Environment  Eye Contact: Good  Speech: Normal Rate  Speech Volume: Normal  Handedness: Right   Mood and Affect  Mood: Euthymic  Affect: Constricted   Thought Process  Thought Processes: Coherent  Descriptions of Associations: Intact  Orientation: Full (Time, Place and Person)  Thought Content: Logical  History of Schizophrenia/Schizoaffective disorder:No data recorded Duration of Psychotic Symptoms:No data recorded Hallucinations: Hallucinations: None  Ideas of Reference: None  Suicidal Thoughts: Suicidal Thoughts: No  Homicidal Thoughts: Homicidal Thoughts: No   Sensorium  Memory: Immediate Fair; Recent Fair; Remote Fair  Judgment: Good  Insight: Good   Executive Functions  Concentration: Good  Attention Span: Good  Recall: Good  Fund of Knowledge: Good  Language: Good   Psychomotor Activity  Psychomotor Activity: Psychomotor Activity: Normal   Assets  Assets: Communication Skills; Desire for Improvement; Social Support   Sleep  Sleep: Sleep: Fair   VITALS  Blood pressure 121/69, pulse 79, temperature 98.6 F (37 C), temperature source Oral, resp. rate 14, SpO2 99%.  LABS  Admission on 05/04/2023  Component Date Value Ref Range Status   Sodium 05/04/2023 140  135 - 145 mmol/L Final   Potassium 05/04/2023 4.3  3.5 - 5.1 mmol/L Final   Chloride 05/04/2023 103  98 - 111 mmol/L Final   CO2 05/04/2023 21 (L)  22 - 32  mmol/L Final   Glucose, Bld 05/04/2023 91  70 - 99 mg/dL Final   Glucose reference range applies only to samples taken after fasting for at least 8 hours.   BUN 05/04/2023 11  6 - 20 mg/dL Final   Creatinine, Ser 05/04/2023 1.20  0.61 - 1.24 mg/dL Final   Calcium 29/56/2130 9.8  8.9 - 10.3 mg/dL Final   Total Protein 86/57/8469 7.0  6.5 - 8.1 g/dL Final   Albumin 62/95/2841 4.1  3.5 - 5.0 g/dL Final   AST 32/44/0102 21  15 - 41 U/L Final   ALT 05/04/2023 21  0 - 44 U/L Final   Alkaline Phosphatase 05/04/2023 70  38 - 126 U/L Final   Total Bilirubin 05/04/2023 0.2 (L)  0.3 - 1.2 mg/dL Final   GFR, Estimated 05/04/2023 >60  >60 mL/min Final   Comment: (NOTE) Calculated using the CKD-EPI Creatinine  Equation (2021)    Anion gap 05/04/2023 16 (H)  5 - 15 Final   Performed at [facility], [address], [place], [place] [address]   Glucose-Capillary 05/04/2023 82  70 - 99 mg/dL Final   Glucose reference range applies only to samples taken after fasting for at least 8 hours.   Comment 1 05/04/2023 Notify RN   Final   Comment 2 05/04/2023 Document in Chart   Final   WBC 05/04/2023 6.7  4.0 - 10.5 K/uL Final   RBC 05/04/2023 4.94  4.22 - 5.81 MIL/uL Final   Hemoglobin 05/04/2023 13.1  13.0 - 17.0 g/dL Final   HCT 60/45/4098 40.5  39.0 - 52.0 % Final   MCV 05/04/2023 82.0  80.0 - 100.0 fL Final   MCH 05/04/2023 26.5  26.0 - 34.0 pg Final   MCHC 05/04/2023 32.3  30.0 - 36.0 g/dL Final   RDW 11/91/4782 12.5  11.5 - 15.5 % Final   Platelets 05/04/2023 257  150 - 400 K/uL Final   nRBC 05/04/2023 0.0  0.0 - 0.2 % Final   Neutrophils Relative % 05/04/2023 69  % Final   Neutro Abs 05/04/2023 4.6  1.7 - 7.7 K/uL Final   Lymphocytes Relative 05/04/2023 17  % Final   Lymphs Abs 05/04/2023 1.1  0.7 - 4.0 K/uL Final   Monocytes Relative 05/04/2023 9  % Final   Monocytes Absolute 05/04/2023 0.6  0.1 - 1.0 K/uL Final   Eosinophils Relative 05/04/2023 4  % Final   Eosinophils Absolute  05/04/2023 0.3  0.0 - 0.5 K/uL Final   Basophils Relative 05/04/2023 1  % Final   Basophils Absolute 05/04/2023 0.1  0.0 - 0.1 K/uL Final   Immature Granulocytes 05/04/2023 0  % Final   Abs Immature Granulocytes 05/04/2023 0.02  0.00 - 0.07 K/uL Final   Performed at [facility], [address] [place], [place] [address]   Magnesium 05/04/2023 2.0  1.7 - 2.4 mg/dL Final   Performed at [facility], [address] [place], [place] [address]   Alcohol, Ethyl (B) 05/04/2023 <10  <10 mg/dL Final   Comment: (NOTE) Lowest detectable limit for serum alcohol is 10 mg/dL.  For medical purposes only. Performed at [facility], [address], [place], [place] [address]    Opiates 05/04/2023 NONE DETECTED  NONE DETECTED Final   Cocaine 05/04/2023 NONE DETECTED  NONE DETECTED Final   Benzodiazepines 05/04/2023 NONE DETECTED  NONE DETECTED Final   Amphetamines 05/04/2023 NONE DETECTED  NONE DETECTED Final   Tetrahydrocannabinol 05/04/2023 NONE DETECTED  NONE DETECTED Final   Barbiturates 05/04/2023 NONE DETECTED  NONE DETECTED Final   Comment: (NOTE) DRUG SCREEN FOR MEDICAL PURPOSES ONLY.  IF CONFIRMATION IS NEEDED FOR ANY PURPOSE, NOTIFY LAB WITHIN 5 DAYS.  LOWEST DETECTABLE LIMITS FOR URINE DRUG SCREEN Drug Class                     Cutoff (ng/mL) Amphetamine and metabolites    1000 Barbiturate and metabolites    200 Benzodiazepine                 200 Opiates and metabolites        300 Cocaine and metabolites        300 THC                            50  Performed at [facility], [address] [place], [place] [address]    Color, Urine 05/04/2023 YELLOW  YELLOW Final   APPearance 05/04/2023 CLEAR  CLEAR Final   Specific Gravity, Urine 05/04/2023 1.013  1.005 - 1.030 Final   pH 05/04/2023 7.0  5.0 - 8.0 Final   Glucose, UA 05/04/2023 NEGATIVE  NEGATIVE mg/dL Final   Hgb urine dipstick 05/04/2023 NEGATIVE  NEGATIVE Final   Bilirubin Urine 05/04/2023  NEGATIVE  NEGATIVE Final   Ketones, ur 05/04/2023 NEGATIVE  NEGATIVE mg/dL Final   Protein, ur 40/04/2724 NEGATIVE  NEGATIVE mg/dL Final   Nitrite 36/64/4034 NEGATIVE  NEGATIVE Final   Leukocytes,Ua 05/04/2023 NEGATIVE  NEGATIVE Final   Performed at [facility], [address], [place], [place] [address]    PSYCHIATRIC REVIEW OF SYSTEMS (ROS)  ROS: Notable for the following relevant positive findings: Review of Systems  Constitutional: Negative.   HENT: Negative.    Respiratory: Negative.    Cardiovascular: Negative.   Gastrointestinal: Negative.   Genitourinary: Negative.   Musculoskeletal: Negative.   Skin: Negative.   Neurological:  Positive for seizures.  Endo/Heme/Allergies: Negative.   Psychiatric/Behavioral: Negative.      Additional findings:      Musculoskeletal: No abnormal movements observed      Gait & Station: Normal      Pain Screening: Denies      Nutrition & Dental Concerns: Reviewed   RISK FORMULATION/ASSESSMENT  Is the patient experiencing any suicidal or homicidal ideations: No        Protective factors considered for safety management:   Saw psychiatrist today and will see again in two weeks. Taking medications, and is working with family to share his concerns as they arise  Risk factors/concerns considered for safety management:  Prior attempt Depression Physical illness/chronic pain Recent loss Access to lethal means Impulsivity Male gender Unmarried  Is there a safety management plan with the patient and treatment team to minimize risk factors and promote protective factors: Yes           Explain: Patient is seeing his outpatient medication provider, and he is considering going back into therapy.  Feels that he can communicate concerns to his parents, and is willing to come back to ED if having issues.  Is crisis care placement or psychiatric hospitalization recommended: No     Based on my current evaluation and risk assessment, patient  is determined at this time to be at:  Low risk  *RISK ASSESSMENT Risk assessment is a dynamic process; it is possible that this patient's condition, and risk level, may change. This should be re-evaluated and managed over time as appropriate. Please re-consult psychiatric consult services if additional assistance is needed in terms of risk assessment and management. If your team decides to discharge this patient, please advise the patient how to best access emergency psychiatric services, or to call 911, if their condition worsens or they feel unsafe in any way.   Ezekiel Slocumb, MD Telepsychiatry Consult ServicesPatient ID: [name], male   DOB: 10/30/2000, 22 y.o.   MRN: [id number]

## 2023-05-04 NOTE — ED Notes (Signed)
Attempted to collect urine sample from pt again. Pt stated he is still unable to provide sample at this time.

## 2023-05-04 NOTE — ED Notes (Addendum)
Pt began having tremors / seizure like activity. MD brought to bedside. Pt able to swallow secretions and maintain airway appropriately while having tremors / seizure like activity. Pt shaking with eyes closed. Episode lasted ~4-5 minutes.

## 2023-05-04 NOTE — ED Notes (Signed)
Attempted to collect urine sample from pt, pt stated he does not feel like he can provide sample at this time, but will attempt to provide sample.

## 2023-05-04 NOTE — ED Notes (Signed)
Pt unable to provide urine sample at this time 

## 2023-05-04 NOTE — ED Notes (Signed)
RN encouraging pt to provide urine sample.

## 2023-05-04 NOTE — ED Notes (Addendum)
Pt had 2nd episode of seizure like activity / tremors lasting ~1 minute. MD notified.

## 2023-05-04 NOTE — ED Notes (Signed)
Pt changed into purple scrubs. All of pt's belongings given to pt's mother.

## 2023-05-04 NOTE — ED Triage Notes (Signed)
Pt to ED via [facility] from home. Pt called EMS for reported seizure like activity. Pt was at [facility] ED 3 weeks ago for same as well as SI and was started on keppra. Pt was home alone when seizure like activity occurred. Unwitnessed. Pt states he does not remember what happened but states he thinks he fell. Pt states he thinks he may have been out of keppra x2 days, but pt's mother reports pt took keppra this morning. Pt c/o generalized weakness and headache. No seizure like activity with EMS. GCS 15 upon arrival to ED. Pt denies any SI / HI today.   EMS: 154/84 86 HR 18 RR 98% RA 89 CBG

## 2023-05-04 NOTE — ED Notes (Signed)
Pt continuing to state he is unable to provide urine sample.

## 2023-05-04 NOTE — BH Assessment (Signed)
TTS assessment has been deferred to [facility]. [facility] Coordinator will communicate time of assessment and name of provider to complete assessment in established secure chat.

## 2023-05-04 NOTE — ED Provider Notes (Signed)
[facility] AT [facility] Provider Note   CSN: [id number] Arrival date & time: 05/04/23  1440     History  Chief Complaint  Patient presents with   Seizures    BRAXLEY Frank is a 22 y.o. male.  HPI Patient presents with concern for seizure-like activity.  He arrives via EMS.  Patient is initially alone after being dropped off by EMS, but is eventually joined by his father.  Patient is on Keppra, unclear if he has been consistent with his medications, however. Father notes that the patient saw his physician today about 2 hours prior to ED arrival, had Keppra prescribed, but he is also unsure if this is a new medication or new dosing.  While the patient is awake he states that he had sensation of feeling weak, possibly had seizure, now feels generally uncomfortable, focally has pain in his head, neck, feels as though he hit them when he had a seizure-like activity just prior to EMS activation. EMS reports mild hypertension, otherwise hemodynamically unremarkable.  Notes ongoing work with psychiatry denies any suicidal or homicidal ideation.     Home Medications Prior to Admission medications   Medication Sig Start Date End Date Taking? Authorizing Provider  acetaminophen (TYLENOL) 325 MG tablet Take 2 tablets (650 mg total) by mouth every 6 (six) hours as needed for mild pain (or Fever >/= 101). 04/12/23  Yes Wieting, Richard, MD  ARIPiprazole (ABILIFY) 2 MG tablet Take 1 tablet (2 mg total) by mouth at bedtime. Antidepressant augmentation 03/20/23  Yes Nwoko, Nicole Kindred I, NP  fenofibrate (TRICOR) 145 MG tablet Take 145 mg by mouth daily. 04/30/23  Yes [provider]  ferrous sulfate 325 (65 FE) MG tablet Take 1 tablet (325 mg total) by mouth daily with breakfast. 04/13/23  Yes Wieting, Richard, MD  FLUoxetine (PROZAC) 40 MG capsule Take 1 capsule (40 mg total) by mouth at bedtime. For depression 03/20/23  Yes Armandina Stammer I, NP  hydrOXYzine  (ATARAX) 25 MG tablet Take 1 tablet (25 mg total) by mouth every 6 (six) hours as needed for anxiety. Patient taking differently: Take 25 mg by mouth as needed for anxiety. 03/20/23  Yes Armandina Stammer I, NP  levETIRAcetam (KEPPRA) 500 MG tablet Take 500 mg by mouth 2 (two) times daily. 05/04/23 05/03/24 Yes [provider]  polyethylene glycol (MIRALAX / GLYCOLAX) 17 g packet Take 17 g by mouth daily as needed for mild constipation. 04/12/23  Yes Wieting, Richard, MD  traZODone (DESYREL) 50 MG tablet Take 1 tablet by mouth at bedtime. 05/04/23 05/03/24 Yes [provider]      Allergies    Bee venom, Hornet venom, Gabapentin, and Haldol [haloperidol]    Review of Systems   Review of Systems  All other systems reviewed and are negative.   Physical Exam Updated Vital Signs BP 121/69 (BP Location: Left Arm)   Pulse 79   Temp 98.6 F (37 C) (Oral)   Resp 14   SpO2 99%  Physical Exam Vitals and nursing note reviewed.  Constitutional:      General: He is not in acute distress.    Appearance: He is well-developed.  HENT:     Head: Normocephalic and atraumatic.  Eyes:     Conjunctiva/sclera: Conjunctivae normal.  Cardiovascular:     Rate and Rhythm: Normal rate and regular rhythm.  Pulmonary:     Effort: Pulmonary effort is normal. No respiratory distress.     Breath sounds: No stridor.  Abdominal:     General: There is no distension.  Skin:    General: Skin is warm and dry.     Comments: Multiple wounds on the patient's forearms, none with active bleeding all aged in appearance.  Neurological:     Mental Status: He is alert and oriented to person, place, and time.     ED Results / Procedures / Treatments   Labs (all labs ordered are listed, but only abnormal results are displayed) Labs Reviewed  COMPREHENSIVE METABOLIC PANEL - Abnormal; Notable for the following components:      Result Value   CO2 21 (*)    Total Bilirubin 0.2 (*)    Anion gap 16 (*)    All  other components within normal limits  CBC WITH DIFFERENTIAL/PLATELET  MAGNESIUM  ETHANOL  RAPID URINE DRUG SCREEN, HOSP PERFORMED  URINALYSIS, ROUTINE W REFLEX MICROSCOPIC  CBG MONITORING, ED    EKG EKG Interpretation Date/Time:  Tuesday May 04 2023 14:42:48 EDT Ventricular Rate:  78 PR Interval:  170 QRS Duration:  104 QT Interval:  362 QTC Calculation: 412 R Axis:   72  Text Interpretation: Normal sinus rhythm with sinus arrhythmia T wave abnormality Abnormal ECG Confirmed by Gerhard Munch [phone]) on 05/04/2023 3:10:15 PM  Radiology CT Head Wo Contrast  Result Date: 05/04/2023 CLINICAL DATA:  Seizures.  Trauma to the head and neck. EXAM: CT HEAD WITHOUT CONTRAST CT CERVICAL SPINE WITHOUT CONTRAST TECHNIQUE: Multidetector CT imaging of the head and cervical spine was performed following the standard protocol without intravenous contrast. Multiplanar CT image reconstructions of the cervical spine were also generated. RADIATION DOSE REDUCTION: This exam was performed according to the departmental dose-optimization program which includes automated exposure control, adjustment of the mA and/or kV according to patient size and/or use of iterative reconstruction technique. COMPARISON:  04/06/2023 FINDINGS: CT HEAD FINDINGS Brain: The brain shows a normal appearance without evidence of malformation, atrophy, old or acute small or large vessel infarction, mass lesion, hemorrhage, hydrocephalus or extra-axial collection. Vascular: No hyperdense vessel. No evidence of atherosclerotic calcification. Skull: Normal.  No traumatic finding.  No focal bone lesion. Sinuses/Orbits: Sinuses are clear. Orbits appear normal. Mastoids are clear. Other: None significant CT CERVICAL SPINE FINDINGS Alignment: Normal Skull base and vertebrae: No traumatic or other focal finding. Soft tissues and spinal canal: Normal Disc levels: Normal. No degenerative change. No stenosis of the canal or foramina. Upper chest:  Normal Other: None IMPRESSION: HEAD CT: Normal. CERVICAL SPINE CT: Normal. Electronically Signed   By: Paulina Fusi M.D.   On: 05/04/2023 18:08   CT Cervical Spine Wo Contrast  Result Date: 05/04/2023 CLINICAL DATA:  Seizures.  Trauma to the head and neck. EXAM: CT HEAD WITHOUT CONTRAST CT CERVICAL SPINE WITHOUT CONTRAST TECHNIQUE: Multidetector CT imaging of the head and cervical spine was performed following the standard protocol without intravenous contrast. Multiplanar CT image reconstructions of the cervical spine were also generated. RADIATION DOSE REDUCTION: This exam was performed according to the departmental dose-optimization program which includes automated exposure control, adjustment of the mA and/or kV according to patient size and/or use of iterative reconstruction technique. COMPARISON:  04/06/2023 FINDINGS: CT HEAD FINDINGS Brain: The brain shows a normal appearance without evidence of malformation, atrophy, old or acute small or large vessel infarction, mass lesion, hemorrhage, hydrocephalus or extra-axial collection. Vascular: No hyperdense vessel. No evidence of atherosclerotic calcification. Skull: Normal.  No traumatic finding.  No focal bone lesion. Sinuses/Orbits: Sinuses are clear. Orbits appear normal. Mastoids  are clear. Other: None significant CT CERVICAL SPINE FINDINGS Alignment: Normal Skull base and vertebrae: No traumatic or other focal finding. Soft tissues and spinal canal: Normal Disc levels: Normal. No degenerative change. No stenosis of the canal or foramina. Upper chest: Normal Other: None IMPRESSION: HEAD CT: Normal. CERVICAL SPINE CT: Normal. Electronically Signed   By: Paulina Fusi M.D.   On: 05/04/2023 18:08    Procedures Procedures    Medications Ordered in ED Medications  levETIRAcetam (KEPPRA) IVPB 1000 mg/100 mL premix (0 mg Intravenous Stopped 05/04/23 1632)  LORazepam (ATIVAN) injection 1 mg (1 mg Intravenous Given 05/04/23 1608)  sodium chloride 0.9 %  bolus 1,000 mL (0 mLs Intravenous Stopped 05/04/23 1738)  ondansetron (ZOFRAN) injection 4 mg (4 mg Intravenous Given 05/04/23 1855)    ED Course/ Medical Decision Making/ A&P                                 Medical Decision Making Adult male presents after possible seizure versus seizure-like episode in the context of previously diagnosed seizures.  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  Patient's initial physical exam is reassuring, but given head pain, neck pain after fall plus minus seizure versus seizure-like episode, differential including fracture, intracranial abnormality, electrolyte abnormalities, stress reaction seizure all considered. Patient had a Keppra loading dose, Ativan provided, and soon after the patient's father arrived he had episode of seizure-like activity with clenching his teeth, rhythmic motion.  Amount and/or Complexity of Data Reviewed Independent Historian: parent External Data Reviewed: notes. Labs: ordered. Decision-making details documented in ED Course. Radiology: ordered and independent interpretation performed. Decision-making details documented in ED Course.  Risk Prescription drug management. Decision regarding hospitalization. Diagnosis or treatment significantly limited by social determinants of health.   Update: Patient accompanied by his father.  He shares my chart records from other facilities after the son became less interactive.  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  Soon thereafter the patient's mother arrived, the patient another episode of clenching, less interactivity.  Only notes the patient has possibly been diagnosed with bipolar  disorder at some point as well.  8:04 PM Patient calm, no distress, no seizure activity, head CT, neck CT reviewed no substantial abnormality, labs essentially unremarkable, trace anion gap, but no true acidosis, suspicion for psychogenic activity given the patient's history.  We discussed benefits of speaking with our behavioral health team and the patient is amenable to this as are his family members.  Without evidence for new suicidal ideation, without new cutting or self-harm behavior, no indication for IVC.        Final Clinical Impression(s) / ED Diagnoses Final diagnoses:  Depression, unspecified depression type     Gerhard Munch, MD 05/04/23 2007

## 2023-05-04 NOTE — Discharge Instructions (Signed)
Please follow up with your psychiatrist tomorrow.  If your symptoms worsen, return to the ED for further help.

## 2023-05-10 ENCOUNTER — Emergency Department (HOSPITAL_COMMUNITY)
Admission: EM | Admit: 2023-05-10 | Discharge: 2023-05-11 | Disposition: A | Discharge status: Home / Self Care | Payer: BC Managed Care – PPO | Attending: Emergency Medicine | Admitting: Emergency Medicine

## 2023-05-10 DIAGNOSIS — R7989 Other specified abnormal findings of blood chemistry: Secondary | ICD-10-CM | POA: Diagnosis not present

## 2023-05-10 DIAGNOSIS — D509 Iron deficiency anemia, unspecified: Secondary | ICD-10-CM | POA: Diagnosis not present

## 2023-05-10 DIAGNOSIS — R569 Unspecified convulsions: Secondary | ICD-10-CM | POA: Insufficient documentation

## 2023-05-10 LAB — CBC WITH DIFFERENTIAL/PLATELET
Abs Immature Granulocytes: 0.04 10*3/uL (ref 0.00–0.07)
Basophils Absolute: 0.1 10*3/uL (ref 0.0–0.1)
Basophils Relative: 1 %
Eosinophils Absolute: 0.3 10*3/uL (ref 0.0–0.5)
Eosinophils Relative: 3 %
HCT: 37.9 % — ABNORMAL LOW (ref 39.0–52.0)
Hemoglobin: 12.4 g/dL — ABNORMAL LOW (ref 13.0–17.0)
Immature Granulocytes: 0 %
Lymphocytes Relative: 16 %
Lymphs Abs: 1.6 10*3/uL (ref 0.7–4.0)
MCH: 25.8 pg — ABNORMAL LOW (ref 26.0–34.0)
MCHC: 32.7 g/dL (ref 30.0–36.0)
MCV: 79 fL — ABNORMAL LOW (ref 80.0–100.0)
Monocytes Absolute: 0.9 10*3/uL (ref 0.1–1.0)
Monocytes Relative: 9 %
Neutro Abs: 7.1 10*3/uL (ref 1.7–7.7)
Neutrophils Relative %: 71 %
Platelets: 250 10*3/uL (ref 150–400)
RBC: 4.8 MIL/uL (ref 4.22–5.81)
RDW: 12.3 % (ref 11.5–15.5)
WBC: 9.9 10*3/uL (ref 4.0–10.5)
nRBC: 0 % (ref 0.0–0.2)

## 2023-05-10 LAB — CBG MONITORING, ED: Glucose-Capillary: 88 mg/dL (ref 70–99)

## 2023-05-10 NOTE — ED Notes (Signed)
Pt denies SI/HI

## 2023-05-10 NOTE — ED Provider Triage Note (Signed)
Emergency Medicine Provider Triage Evaluation Note  KYMARI NUON , a 22 y.o. male  was evaluated in triage.  Pt complains of a seizure episode at work.  He does not remember what happened but states he has a headache but that is pretty typical after these episodes.  Review of Systems  Positive: Headache, seizure Negative: Focal weakness  Physical Exam  BP 132/68   Pulse 71   Temp 98 F (36.7 C)   Resp 18   SpO2 100%  Gen:   Awake, no distress   Resp:  Normal effort  MSK:   Generally weak/poor effort throughout all 4 extremities  Medical Decision Making  Medically screening exam initiated at 10:58 PM.  Appropriate orders placed.  Gaelen Brager Bocek was informed that the remainder of the evaluation will be completed by another provider, this initial triage assessment does not replace that evaluation, and the importance of remaining in the ED until their evaluation is complete.  Will order screening labs and EKG.  Frequently gets headaches after these episodes and there was no report of trauma so we will hold off on head CT for now.   Pricilla Loveless, MD 05/10/23 2258

## 2023-05-10 NOTE — ED Notes (Signed)
Pt father waiting in lobby, stated he will be waiting out in the lobby and would like updates, pts father informed that currently pt is stable, we have sent lab work that we are waiting for results, pts father informed me that pt had taken his keppra yesterday morning, last night and this morning, he had gotten a call around 1900 from pt to bring him something to drink and his keppra, pts father suspects pt has not eaten or drank anything since this morning.

## 2023-05-10 NOTE — ED Triage Notes (Addendum)
Pt to ED via EMS from work. Pt texted 911 stating he felt like he was going to have a seizure while at work. Pt here for same last week. 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 EMS reports pt did not take keppra tonight per father. Per father, pt refusing to go to out pt therapy and refuses to take [facility] medications at home. Upon EMS arrival, pt was shaking / tremoring with his eyes closed. No seizure like activity upon arrival to ED. Pt drowsy upon arrival to ED.    EMS: 126/94 73 HR 97% RA 106 CBG 98.5 temp

## 2023-05-11 LAB — COMPREHENSIVE METABOLIC PANEL
ALT: 18 U/L (ref 0–44)
AST: 23 U/L (ref 15–41)
Albumin: 4.4 g/dL (ref 3.5–5.0)
Alkaline Phosphatase: 75 U/L (ref 38–126)
Anion gap: 11 (ref 5–15)
BUN: 16 mg/dL (ref 6–20)
CO2: 23 mmol/L (ref 22–32)
Calcium: 9.5 mg/dL (ref 8.9–10.3)
Chloride: 106 mmol/L (ref 98–111)
Creatinine, Ser: 1.41 mg/dL — ABNORMAL HIGH (ref 0.61–1.24)
GFR, Estimated: >60 mL/min (ref >60)
Glucose, Bld: 98 mg/dL (ref 70–99)
Potassium: 3.8 mmol/L (ref 3.5–5.1)
Sodium: 140 mmol/L (ref 135–145)
Total Bilirubin: 0.5 mg/dL (ref 0.3–1.2)
Total Protein: 7.4 g/dL (ref 6.5–8.1)

## 2023-05-11 LAB — CK: Total CK: 318 U/L (ref 49–397)

## 2023-05-11 LAB — MAGNESIUM: Magnesium: 2 mg/dL (ref 1.7–2.4)

## 2023-05-11 LAB — I-STAT CG4 LACTIC ACID, ED: Lactic Acid, Venous: 0.6 mmol/L (ref 0.5–1.9)

## 2023-05-11 MED ORDER — LEVETIRACETAM IN NACL 1000 MG/100ML IV SOLN
1000.0000 mg | Freq: Once | INTRAVENOUS | Status: AC
  Administered 2023-05-11: 1000 mg via INTRAVENOUS
  Filled 2023-05-11: qty 100

## 2023-05-11 NOTE — Discharge Instructions (Addendum)
Please follow-up with the neurologist.  Until then, make sure that you do not miss any doses of your seizure medicine, levetiracetam.

## 2023-05-11 NOTE — ED Provider Notes (Signed)
[facility] AT [facility] Provider Note   CSN: [id number] Arrival date & time: 05/10/23  2234     History  Chief Complaint  Patient presents with   Seizures    Vincent Frank is a 22 y.o. male.  The history is provided by the patient.  Seizures He has history of depression and questionable history of seizures and was brought in by ambulance because he was reported to have had a witnessed seizure where he is working.  There was no incontinence and no bit lip or tongue.  Patient does not remember the incident.  He thinks he has not missed any doses of his levetiracetam but is not sure.  He states he has not seen a neurologist.  He has been taking the levetiracetam for several weeks.   Home Medications Prior to Admission medications   Medication Sig Start Date End Date Taking? Authorizing Provider  acetaminophen (TYLENOL) 325 MG tablet Take 2 tablets (650 mg total) by mouth every 6 (six) hours as needed for mild pain (or Fever >/= 101). 04/12/23   Alford Highland, MD  ARIPiprazole (ABILIFY) 2 MG tablet Take 1 tablet (2 mg total) by mouth at bedtime. Antidepressant augmentation 03/20/23   Armandina Stammer I, NP  fenofibrate (TRICOR) 145 MG tablet Take 145 mg by mouth daily. 04/30/23   [provider]  ferrous sulfate 325 (65 FE) MG tablet Take 1 tablet (325 mg total) by mouth daily with breakfast. 04/13/23   Alford Highland, MD  FLUoxetine (PROZAC) 40 MG capsule Take 1 capsule (40 mg total) by mouth at bedtime. For depression 03/20/23   Armandina Stammer I, NP  hydrOXYzine (ATARAX) 25 MG tablet Take 1 tablet (25 mg total) by mouth every 6 (six) hours as needed for anxiety. Patient taking differently: Take 25 mg by mouth as needed for anxiety. 03/20/23   Armandina Stammer I, NP  levETIRAcetam (KEPPRA) 500 MG tablet Take 500 mg by mouth 2 (two) times daily. 05/04/23 05/03/24  [provider]  polyethylene glycol (MIRALAX / GLYCOLAX) 17 g packet Take 17 g  by mouth daily as needed for mild constipation. 04/12/23   Alford Highland, MD  traZODone (DESYREL) 50 MG tablet Take 1 tablet by mouth at bedtime. 05/04/23 05/03/24  [provider]      Allergies    Bee venom, Hornet venom, Gabapentin, and Haldol [haloperidol]    Review of Systems   Review of Systems  Neurological:  Positive for seizures.  All other systems reviewed and are negative.   Physical Exam Updated Vital Signs BP 132/68   Pulse 71   Temp 98 F (36.7 C)   Resp 18   SpO2 100%  Physical Exam Vitals and nursing note reviewed.   22 year old male, resting comfortably and in no acute distress. Vital signs are normal. Oxygen saturation is 100%, which is normal. Head is normocephalic and atraumatic. PERRLA, EOMI. Oropharynx is clear.  No evidence of bit lip or tongue. Neck is nontender and supple. Lungs are clear without rales, wheezes, or rhonchi. Chest is nontender. Heart has regular rate and rhythm without murmur. Abdomen is soft, flat, nontender. Extremities have no cyanosis or edema, full range of motion is present. Skin is warm and dry without rash. Neurologic: Drowsy but arousable, oriented x 3, cranial nerves are intact, moves all extremities equally.  ED Results / Procedures / Treatments   Labs (all labs ordered are listed, but only abnormal results are displayed) Labs Reviewed  COMPREHENSIVE METABOLIC PANEL - Abnormal; Notable for the following components:      Result Value   Creatinine, Ser 1.41 (*)    All other components within normal limits  CBC WITH DIFFERENTIAL/PLATELET - Abnormal; Notable for the following components:   Hemoglobin 12.4 (*)    HCT 37.9 (*)    MCV 79.0 (*)    MCH 25.8 (*)    All other components within normal limits  MAGNESIUM  CBG MONITORING, ED    EKG Interpretation Date/Time:  Monday May 10 2023 23:09:52 EDT Ventricular Rate:  67 PR Interval:  164 QRS Duration:  112 QT Interval:  402 QTC Calculation: 424 R  Axis:   50  Text Interpretation: Normal sinus rhythm Normal ECG When compared with ECG of 04-May-2023 14:42, No significant change was found Confirmed by Dione Booze ([id number]) on 05/11/2023 2:11:13 AM        Procedures Procedures    Medications Ordered in ED Medications  levETIRAcetam (KEPPRA) IVPB 1000 mg/100 mL premix (has no administration in time range)    ED Course/ Medical Decision Making/ A&P                                 Medical Decision Making Amount and/or Complexity of Data Reviewed Labs: ordered.  Risk Prescription drug management.   Possible seizure.  I have reviewed his past records, and it appears that initial prescription for levetiracetam was done on 10/8 during a video visit.  I have reviewed his laboratory test, and my interpretation is elevated serum creatinine only mildly increased compared with 10/8, stable anemia.  I have ordered a dose of levetiracetam.  I have also ordered lactic acid level and CK to look for clues for possible actual seizure activity.  I feel he should be evaluated by a neurologist giving him the question of whether he is truly having seizures.  I have reviewed the remainder of his laboratory tests, and my interpretation is normal lactic acid level and CK, normal magnesium.  This does not mean that he did not have a seizure.  I have reviewed his electrocardiogram, and my interpretation is normal ECG.  I am discharging him with a referral to neurology.  He probably will need EEG.  In the meantime, I have stressed the importance of medication compliance.  Final Clinical Impression(s) / ED Diagnoses Final diagnoses:  Seizure-like activity (HCC)  Elevated serum creatinine  Microcytic anemia    Rx / DC Orders ED Discharge Orders     None         Dione Booze, MD 05/11/23 [phone]

## 2023-05-11 NOTE — ED Notes (Signed)
ED Provider at bedside. 

## 2023-05-11 NOTE — ED Notes (Signed)
Report received from [facility]. RN. Assumed care of pt at this time.

## 2023-05-22 ENCOUNTER — Emergency Department (HOSPITAL_COMMUNITY): Payer: BC Managed Care – PPO

## 2023-05-22 ENCOUNTER — Other Ambulatory Visit: Payer: Self-pay

## 2023-05-22 ENCOUNTER — Emergency Department (HOSPITAL_COMMUNITY)
Admission: EM | Admit: 2023-05-22 | Discharge: 2023-05-22 | Disposition: A | Discharge status: Home / Self Care | Payer: BC Managed Care – PPO

## 2023-05-22 DIAGNOSIS — R569 Unspecified convulsions: Secondary | ICD-10-CM | POA: Diagnosis present

## 2023-05-22 LAB — BASIC METABOLIC PANEL
Anion gap: 8 (ref 5–15)
Calcium: 9.4 mg/dL (ref 8.9–10.3)
Creatinine, Ser: 1.44 mg/dL — ABNORMAL HIGH (ref 0.61–1.24)
Glucose, Bld: 93 mg/dL (ref 70–99)
Potassium: 3.9 mmol/L (ref 3.5–5.1)

## 2023-05-22 LAB — CBC
HCT: 40.8 % (ref 39.0–52.0)
Hemoglobin: 13.2 g/dL (ref 13.0–17.0)
WBC: 9.6 10*3/uL (ref 4.0–10.5)
nRBC: 0 % (ref 0.0–0.2)

## 2023-05-22 LAB — I-STAT CG4 LACTIC ACID, ED: Lactic Acid, Venous: 1.6 mmol/L (ref 0.5–1.9)

## 2023-05-22 MED ORDER — LEVETIRACETAM IN NACL 1000 MG/100ML IV SOLN
1000.0000 mg | Freq: Once | INTRAVENOUS | Status: AC
  Administered 2023-05-22: 1000 mg via INTRAVENOUS
  Filled 2023-05-22: qty 100

## 2023-05-22 MED ORDER — LORAZEPAM 2 MG/ML IJ SOLN
INTRAMUSCULAR | Status: AC
  Filled 2023-05-22: qty 1

## 2023-05-23 ENCOUNTER — Emergency Department (HOSPITAL_COMMUNITY): Payer: BC Managed Care – PPO

## 2023-05-23 ENCOUNTER — Emergency Department (HOSPITAL_COMMUNITY)
Admission: EM | Admit: 2023-05-23 | Discharge: 2023-05-24 | Disposition: A | Discharge status: Home / Self Care | Payer: BC Managed Care – PPO | Source: Home / Self Care | Attending: Student | Admitting: Student

## 2023-05-23 ENCOUNTER — Encounter (HOSPITAL_COMMUNITY): Payer: Self-pay

## 2023-05-23 ENCOUNTER — Other Ambulatory Visit: Payer: Self-pay

## 2023-05-23 DIAGNOSIS — I517 Cardiomegaly: Secondary | ICD-10-CM | POA: Insufficient documentation

## 2023-05-23 DIAGNOSIS — R0600 Dyspnea, unspecified: Secondary | ICD-10-CM | POA: Insufficient documentation

## 2023-05-23 DIAGNOSIS — R569 Unspecified convulsions: Secondary | ICD-10-CM | POA: Insufficient documentation

## 2023-05-23 DIAGNOSIS — F332 Major depressive disorder, recurrent severe without psychotic features: Secondary | ICD-10-CM | POA: Diagnosis not present

## 2023-05-23 DIAGNOSIS — W19XXXA Unspecified fall, initial encounter: Secondary | ICD-10-CM | POA: Insufficient documentation

## 2023-05-23 DIAGNOSIS — T63441A Toxic effect of venom of bees, accidental (unintentional), initial encounter: Secondary | ICD-10-CM | POA: Insufficient documentation

## 2023-05-23 DIAGNOSIS — F419 Anxiety disorder, unspecified: Secondary | ICD-10-CM | POA: Insufficient documentation

## 2023-05-23 DIAGNOSIS — E162 Hypoglycemia, unspecified: Secondary | ICD-10-CM | POA: Insufficient documentation

## 2023-05-23 DIAGNOSIS — T450X4A Poisoning by antiallergic and antiemetic drugs, undetermined, initial encounter: Secondary | ICD-10-CM | POA: Insufficient documentation

## 2023-05-23 HISTORY — DX: Unspecified convulsions: R56.9

## 2023-05-23 LAB — COMPREHENSIVE METABOLIC PANEL
ALT: 24 U/L (ref 0–44)
AST: 24 U/L (ref 15–41)
Albumin: 4.5 g/dL (ref 3.5–5.0)
Alkaline Phosphatase: 73 U/L (ref 38–126)
Anion gap: 12 (ref 5–15)
BUN: 12 mg/dL (ref 6–20)
CO2: 22 mmol/L (ref 22–32)
Calcium: 9.5 mg/dL (ref 8.9–10.3)
Chloride: 103 mmol/L (ref 98–111)
Creatinine, Ser: 1.41 mg/dL — ABNORMAL HIGH (ref 0.61–1.24)
GFR, Estimated: >60 mL/min (ref >60)
Glucose, Bld: 90 mg/dL (ref 70–99)
Potassium: 3.5 mmol/L (ref 3.5–5.1)
Sodium: 137 mmol/L (ref 135–145)
Total Bilirubin: 0.5 mg/dL (ref 0.3–1.2)
Total Protein: 7.2 g/dL (ref 6.5–8.1)

## 2023-05-23 LAB — I-STAT CG4 LACTIC ACID, ED
Lactic Acid, Venous: 1.6 mmol/L (ref 0.5–1.9)
Lactic Acid, Venous: 1.5 mmol/L (ref 0.5–1.9)

## 2023-05-23 LAB — CBC WITH DIFFERENTIAL/PLATELET
Abs Immature Granulocytes: 0.04 10*3/uL (ref 0.00–0.07)
Basophils Absolute: 0.1 10*3/uL (ref 0.0–0.1)
Basophils Relative: 1 %
Eosinophils Absolute: 0.3 10*3/uL (ref 0.0–0.5)
Eosinophils Relative: 3 %
HCT: 40.7 % (ref 39.0–52.0)
Hemoglobin: 13.4 g/dL (ref 13.0–17.0)
Immature Granulocytes: 0 %
Lymphocytes Relative: 14 %
Lymphs Abs: 1.5 10*3/uL (ref 0.7–4.0)
MCH: 26 pg (ref 26.0–34.0)
MCHC: 32.9 g/dL (ref 30.0–36.0)
MCV: 79 fL — ABNORMAL LOW (ref 80.0–100.0)
Monocytes Absolute: 0.8 10*3/uL (ref 0.1–1.0)
Monocytes Relative: 7 %
Neutro Abs: 8.4 10*3/uL — ABNORMAL HIGH (ref 1.7–7.7)
Neutrophils Relative %: 75 %
Platelets: 265 10*3/uL (ref 150–400)
RBC: 5.15 MIL/uL (ref 4.22–5.81)
RDW: 12.3 % (ref 11.5–15.5)
WBC: 11.1 10*3/uL — ABNORMAL HIGH (ref 4.0–10.5)
nRBC: 0 % (ref 0.0–0.2)

## 2023-05-23 LAB — I-STAT CHEM 8, ED
BUN: 15 mg/dL (ref 6–20)
Calcium, Ion: 1.18 mmol/L (ref 1.15–1.40)
Chloride: 104 mmol/L (ref 98–111)
Creatinine, Ser: 1.4 mg/dL — ABNORMAL HIGH (ref 0.61–1.24)
Glucose, Bld: 89 mg/dL (ref 70–99)
HCT: 40 % (ref 39.0–52.0)
Hemoglobin: 13.6 g/dL (ref 13.0–17.0)
Potassium: 3.7 mmol/L (ref 3.5–5.1)
Sodium: 140 mmol/L (ref 135–145)
TCO2: 24 mmol/L (ref 22–32)

## 2023-05-23 LAB — SALICYLATE LEVEL: Salicylate Lvl: <7 mg/dL — ABNORMAL LOW (ref 7.0–30.0)

## 2023-05-23 LAB — CK: Total CK: 201 U/L (ref 49–397)

## 2023-05-23 LAB — CBG MONITORING, ED: Glucose-Capillary: 77 mg/dL (ref 70–99)

## 2023-05-23 LAB — ACETAMINOPHEN LEVEL: Acetaminophen (Tylenol), Serum: <10 ug/mL — ABNORMAL LOW (ref 10–30)

## 2023-05-23 LAB — MAGNESIUM: Magnesium: 1.7 mg/dL (ref 1.7–2.4)

## 2023-05-23 LAB — ETHANOL: Alcohol, Ethyl (B): <10 mg/dL (ref <10)

## 2023-05-23 MED ORDER — SODIUM CHLORIDE 0.9 % IV SOLN: Freq: Once | INTRAVENOUS | Status: AC

## 2023-05-23 MED ORDER — SODIUM CHLORIDE 0.9 % IV BOLUS
1000.0000 mL | Freq: Once | INTRAVENOUS | Status: AC
  Administered 2023-05-23: 1000 mL via INTRAVENOUS

## 2023-05-23 MED ORDER — LORAZEPAM 2 MG/ML IJ SOLN
INTRAMUSCULAR | Status: AC
  Filled 2023-05-23: qty 1

## 2023-05-23 NOTE — ED Triage Notes (Signed)
Pt BIB EMS after pt was found down on a trail. Per EMS, pt was running on a trail and got stung by a bee and administered his epi pen and took some benadryl. Pt did have an unresponsiveness episode and was being bagged with an BVM on arrival. Per EMS, pt was given epi and 2.5mg  of versed due to having hives and seizure like activity. Pt has hx of bee venom allergy and seizure disorder.

## 2023-05-23 NOTE — ED Notes (Signed)
Portable xray at bedside.

## 2023-05-23 NOTE — ED Provider Notes (Signed)
[place] EMERGENCY DEPARTMENT AT [facility] Provider Note   CSN: [id number] Arrival date & time: 05/23/23  2042     History {Add pertinent medical, surgical, social history, OB history to HPI:1} Chief Complaint  Patient presents with   Altered Mental Status    Vincent Frank is a 22 y.o. male.  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  He was seen yesterday with altered mental status and seizure-like activity.  At that time he apparently had been found out hiking and sitting on a trail.  Today report is that the patient was out hiking on trails again and got stung by a bee.  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  Apparently he was able to use his phone to call for EMS stating he was coming out of the woods for help but in the meantime had fallen into a creek and had to climb out.  He reportedly did administer his own EpiPen and was found beside the trail with blood all over him and poorly responsive.  For EMS transport, they report patient has some waxing and waning level of consciousness.  1 point in time he had tonic-clonic seizure-like activity and was administered Versed.  EMS reports that when he was not having seizure-like activity however sometimes his respiratory rate will go down and they did end up bagging him and gave an IM 0.3 epinephrine just on arrival to the emergency department with concerns for allergic reaction and respiratory depression from potential allergic reaction.  Patient arrived with them performing bag-valve-mask assisted respirations.  Patient's oxygen saturation had stayed 100% for EMS during transport.  Blood pressures were normotensive.  They report pupils were dilated and there was not indication for Narcan use per protocol.  Reportedly the patient had Benadryl on hand and had taken "a handful of Benadryl" after this bee sting.    Home Medications Prior to Admission medications   Not on File       Allergies    Bee venom, Gabapentin, and Hornet venom    Review of Systems   Review of Systems  Physical Exam Updated Vital Signs BP (!) 142/81   Pulse 93   Temp 99.9 F (37.7 C) (Rectal)   Resp 18   SpO2 100%  Physical Exam Constitutional:      Comments: Patient is well-nourished well-developed.  Color is good.  EMS is assisting with ventilations but patient is having spontaneous respirations.  He is not responding to questions or making purposeful movements.  HENT:     Head:     Comments: Patient is covered in mud but no apparent abrasions or contusions identified to face head or neck.    Nose:     Comments: Nares are patent.  No blood.    Mouth/Throat:     Comments: Airway is clear.  No apparent lacerations or dental injury. Eyes:     Comments: Pupils are about 4 mm and symmetric.  Patient sometimes has some spontaneous eye opening and there is some nystagmus present.  Neck:     Comments: No visible signs of neck trauma.  No swelling.  No abrasions. Cardiovascular:     Rate and Rhythm: Normal rate and regular rhythm.  Pulmonary:     Effort: Pulmonary effort is normal.     Breath sounds: Normal breath sounds.     Comments: Chest wall without any abrasions or contusions. Abdominal:     Comments: Abdomen is soft and flat.  No  abrasions or contusions.  Genitourinary:    Comments: Penis testicles normal in appearance without any apparent trauma.  No swelling or hematomas. Musculoskeletal:     Comments: Back exam and no deformities no bruises no abrasions.  Bilateral extremities upper and lower without apparent contusions abrasions or deformities.  Patient's lower legs have some symmetric well-healed linear scars over the shins consistent with old inflicted lacerations.  Skin:    General: Skin is warm and dry.  Neurological:     Comments: Patient will blink to corneal reflex.  He is not answering any questions.  He is not making any spontaneous movements.     ED Results  / Procedures / Treatments   Labs (all labs ordered are listed, but only abnormal results are displayed) Labs Reviewed  COMPREHENSIVE METABOLIC PANEL - Abnormal; Notable for the following components:      Result Value   Creatinine, Ser 1.41 (*)    All other components within normal limits  ACETAMINOPHEN LEVEL - Abnormal; Notable for the following components:   Acetaminophen (Tylenol), Serum <10 (*)    All other components within normal limits  SALICYLATE LEVEL - Abnormal; Notable for the following components:   Salicylate Lvl <7.0 (*)    All other components within normal limits  CBC WITH DIFFERENTIAL/PLATELET - Abnormal; Notable for the following components:   WBC 11.1 (*)    MCV 79.0 (*)    Neutro Abs 8.4 (*)    All other components within normal limits  I-STAT CHEM 8, ED - Abnormal; Notable for the following components:   Creatinine, Ser 1.40 (*)    All other components within normal limits  ETHANOL  MAGNESIUM  CK  URINALYSIS, ROUTINE W REFLEX MICROSCOPIC  RAPID URINE DRUG SCREEN, HOSP PERFORMED  CBG MONITORING, ED  I-STAT CG4 LACTIC ACID, ED  I-STAT CG4 LACTIC ACID, ED    EKG EKG Interpretation Date/Time:  Sunday May 23 2023 20:45:23 EDT Ventricular Rate:  100 PR Interval:  204 QRS Duration:  115 QT Interval:  388 QTC Calculation: 501 R Axis:   60  Text Interpretation: Sinus tachycardia Incomplete right bundle branch block Probable left ventricular hypertrophy Prolonged QT interval no old comparison Confirmed by Arby Barrette [phone]) on 05/23/2023 9:04:38 PM  Radiology No results found.  Procedures Procedures  {Document cardiac monitor, telemetry assessment procedure when appropriate:1} CRITICAL CARE Performed by: Arby Barrette   Total critical care time: 30 minutes  Critical care time was exclusive of separately billable procedures and treating other patients.  Critical care was necessary to treat or prevent imminent or life-threatening  deterioration.  Critical care was time spent personally by me on the following activities: development of treatment plan with patient and/or surrogate as well as nursing, discussions with consultants, evaluation of patient's response to treatment, examination of patient, obtaining history from patient or surrogate, ordering and performing treatments and interventions, ordering and review of laboratory studies, ordering and review of radiographic studies, pulse oximetry and re-evaluation of patient's condition.  Medications Ordered in ED Medications  LORazepam (ATIVAN) 2 MG/ML injection (  Not Given 05/23/23 2310)  sodium chloride 0.9 % bolus 1,000 mL (0 mLs Intravenous Stopped 05/23/23 2223)  0.9 %  sodium chloride infusion ( Intravenous New Bag/Given 05/23/23 2223)    ED Course/ Medical Decision Making/ A&P   {   Click here for ABCD2, HEART and other calculatorsREFRESH Note before signing :1}  Medical Decision Making Amount and/or Complexity of Data Reviewed Labs: ordered. Radiology: ordered.  Risk Prescription drug management.   On arrival patient is not making verbal response but is spontaneously breathing.  I have been able to remove bag-valve-mask and he is consistently breathing.  He is not showing signs of oxygen desaturation.  Blood pressures are normotensive and heart rate is regular narrow complex.  Will continue with diagnostic evaluation for altered mental status in setting of possible allergic anaphylactic reaction which was treated in the field and is currently not exhibiting tachycardia or hypotension.  Will monitor closely.  Will evaluate for traumatic head injury given fall into the [facility] but clinically there is no external signs of head injury.  Will review metabolic profiles and drug screens.  Patient will require close observation and monitoring.  Lactic acid 1.5.  Salicylate less than 7.  Ethanol less than 10.  Magnesium normal.  Metabolic  panel normal except creatinine of 1.4.  Mild elevation.  White count 11.1 normal H&H.  CBG 77.  CT head and C-spine are pending.  23: 20 patient is awake and alert at this point in time no acute distress.  Dr. Ronnie Derby to follow-up on additional diagnostic studies.  If all is within normal limits suspect patient will be stable for discharge.  At this time vital signs have remained stable respiratory status has remained stable.  Patient has prior history of nonepileptic seizure-like activity.  He does not have any ongoing evidence of significant anaphylactic reaction currently.  {Document critical care time when appropriate:1} {Document review of labs and clinical decision tools ie heart score, Chads2Vasc2 etc:1}  {Document your independent review of radiology images, and any outside records:1} {Document your discussion with family members, caretakers, and with consultants:1} {Document social determinants of health affecting pt's care:1} {Document your decision making why or why not admission, treatments were needed:1} Final Clinical Impression(s) / ED Diagnoses Final diagnoses:  None    Rx / DC Orders ED Discharge Orders     None

## 2023-05-24 ENCOUNTER — Other Ambulatory Visit: Payer: Self-pay

## 2023-05-24 ENCOUNTER — Emergency Department (HOSPITAL_COMMUNITY)
Admission: EM | Admit: 2023-05-24 | Discharge: 2023-05-24 | Disposition: A | Discharge status: Home / Self Care | Payer: BC Managed Care – PPO | Attending: Emergency Medicine | Admitting: Emergency Medicine

## 2023-05-24 ENCOUNTER — Emergency Department (HOSPITAL_COMMUNITY)
Admission: EM | Admit: 2023-05-24 | Discharge: 2023-05-25 | Disposition: A | Discharge status: Other Acute Inpatient Hospital | Payer: BC Managed Care – PPO | Source: Home / Self Care | Attending: Emergency Medicine | Admitting: Emergency Medicine

## 2023-05-24 ENCOUNTER — Encounter: Payer: Self-pay | Admitting: Internal Medicine

## 2023-05-24 DIAGNOSIS — T450X4A Poisoning by antiallergic and antiemetic drugs, undetermined, initial encounter: Secondary | ICD-10-CM | POA: Insufficient documentation

## 2023-05-24 DIAGNOSIS — T50904A Poisoning by unspecified drugs, medicaments and biological substances, undetermined, initial encounter: Secondary | ICD-10-CM

## 2023-05-24 DIAGNOSIS — F419 Anxiety disorder, unspecified: Secondary | ICD-10-CM | POA: Insufficient documentation

## 2023-05-24 DIAGNOSIS — E162 Hypoglycemia, unspecified: Secondary | ICD-10-CM | POA: Insufficient documentation

## 2023-05-24 LAB — URINALYSIS, ROUTINE W REFLEX MICROSCOPIC
Bacteria, UA: NONE SEEN
Bilirubin Urine: NEGATIVE
Glucose, UA: NEGATIVE mg/dL
Hgb urine dipstick: NEGATIVE
Ketones, ur: NEGATIVE mg/dL
Nitrite: NEGATIVE
Protein, ur: NEGATIVE mg/dL
Specific Gravity, Urine: 1.006 (ref 1.005–1.030)
pH: 7 (ref 5.0–8.0)

## 2023-05-24 LAB — CBC WITH DIFFERENTIAL/PLATELET
Abs Immature Granulocytes: 0.06 10*3/uL (ref 0.00–0.07)
Basophils Absolute: 0 10*3/uL (ref 0.0–0.1)
Basophils Relative: 0 %
Eosinophils Absolute: 0.1 10*3/uL (ref 0.0–0.5)
Eosinophils Relative: 1 %
HCT: 39.8 % (ref 39.0–52.0)
Hemoglobin: 12.5 g/dL — ABNORMAL LOW (ref 13.0–17.0)
Immature Granulocytes: 0 %
Lymphocytes Relative: 7 %
Lymphs Abs: 1.1 10*3/uL (ref 0.7–4.0)
MCH: 25.1 pg — ABNORMAL LOW (ref 26.0–34.0)
MCHC: 31.4 g/dL (ref 30.0–36.0)
MCV: 79.9 fL — ABNORMAL LOW (ref 80.0–100.0)
Monocytes Absolute: 0.9 10*3/uL (ref 0.1–1.0)
Monocytes Relative: 6 %
Neutro Abs: 12.3 10*3/uL — ABNORMAL HIGH (ref 1.7–7.7)
Neutrophils Relative %: 86 %
Platelets: 260 10*3/uL (ref 150–400)
RBC: 4.98 MIL/uL (ref 4.22–5.81)
RDW: 12.3 % (ref 11.5–15.5)
WBC: 14.5 10*3/uL — ABNORMAL HIGH (ref 4.0–10.5)
nRBC: 0 % (ref 0.0–0.2)

## 2023-05-24 LAB — RAPID URINE DRUG SCREEN, HOSP PERFORMED
Amphetamines: NOT DETECTED
Barbiturates: NOT DETECTED
Benzodiazepines: POSITIVE — AB
Cocaine: NOT DETECTED
Opiates: NOT DETECTED
Tetrahydrocannabinol: NOT DETECTED

## 2023-05-24 LAB — COMPREHENSIVE METABOLIC PANEL
ALT: 25 U/L (ref 0–44)
AST: 27 U/L (ref 15–41)
Albumin: 4.5 g/dL (ref 3.5–5.0)
Alkaline Phosphatase: 69 U/L (ref 38–126)
Anion gap: 10 (ref 5–15)
BUN: 10 mg/dL (ref 6–20)
CO2: 20 mmol/L — ABNORMAL LOW (ref 22–32)
Calcium: 9.3 mg/dL (ref 8.9–10.3)
Chloride: 110 mmol/L (ref 98–111)
Creatinine, Ser: 1.35 mg/dL — ABNORMAL HIGH (ref 0.61–1.24)
GFR, Estimated: >60 mL/min (ref >60)
Glucose, Bld: 80 mg/dL (ref 70–99)
Potassium: 3.9 mmol/L (ref 3.5–5.1)
Sodium: 140 mmol/L (ref 135–145)
Total Bilirubin: 0.6 mg/dL (ref 0.3–1.2)
Total Protein: 7.2 g/dL (ref 6.5–8.1)

## 2023-05-24 LAB — SALICYLATE LEVEL: Salicylate Lvl: <7 mg/dL — ABNORMAL LOW (ref 7.0–30.0)

## 2023-05-24 LAB — ACETAMINOPHEN LEVEL: Acetaminophen (Tylenol), Serum: <10 ug/mL — ABNORMAL LOW (ref 10–30)

## 2023-05-24 LAB — CBG MONITORING, ED
Glucose-Capillary: 59 mg/dL — ABNORMAL LOW (ref 70–99)
Glucose-Capillary: 92 mg/dL (ref 70–99)

## 2023-05-24 LAB — ETHANOL: Alcohol, Ethyl (B): <10 mg/dL (ref <10)

## 2023-05-24 LAB — BRAIN NATRIURETIC PEPTIDE: B Natriuretic Peptide: 7.4 pg/mL (ref 0.0–100.0)

## 2023-05-24 LAB — TROPONIN I (HIGH SENSITIVITY): Troponin I (High Sensitivity): 3 ng/L (ref <18)

## 2023-05-24 MED ORDER — SODIUM CHLORIDE 0.9 % IV BOLUS
1000.0000 mL | Freq: Once | INTRAVENOUS | Status: AC
Start: 2023-05-24 — End: 2023-05-24
  Administered 2023-05-24: 1000 mL via INTRAVENOUS

## 2023-05-24 MED ORDER — DEXTROSE 50 % IV SOLN
1.0000 | Freq: Once | INTRAVENOUS | Status: AC
  Administered 2023-05-24: 50 mL via INTRAVENOUS
  Filled 2023-05-24: qty 50

## 2023-05-24 MED ORDER — LORAZEPAM 2 MG/ML IJ SOLN
2.0000 mg | Freq: Once | INTRAMUSCULAR | Status: AC
  Administered 2023-05-24: 2 mg via INTRAMUSCULAR
  Filled 2023-05-24: qty 1

## 2023-05-24 NOTE — ED Provider Notes (Signed)
[place] EMERGENCY DEPARTMENT AT [facility] Provider Note  CSN: [id number] Arrival date & time: 05/24/23 4098  Chief Complaint(s) Unresponsive  HPI Vincent Frank is a 22 y.o. male with a past medical history of nonepileptic seizures, TBI returns after being discharged few hours ago after being evaluated for nonepileptic seizures in the setting of reported bee sting.  Patient was reportedly found under a bridge by bystanders.  He [employer] called who called PTR.  When PT arrived, patient became unresponsive prompting a call to EMS.  Patient had intermittent decrease in respirations and assisted ventilations were provided. Remained HDS.  The history is provided by the EMS personnel.    Past Medical History Past Medical History:  Diagnosis Date   Seizures (HCC)    There are no problems to display for this patient.  Home Medication(s) Prior to Admission medications   Not on File                                                                                                                                    Allergies Bee venom, Gabapentin, and Hornet venom  Review of Systems Review of Systems As noted in HPI  Physical Exam Vital Signs  I have reviewed the triage vital signs BP (!) 150/93   Pulse 66   Temp 98.6 F (37 C) (Rectal)   Resp 19   SpO2 100%   Physical Exam Vitals reviewed.  Constitutional:      General: He is not in acute distress.    Appearance: He is well-developed. He is not diaphoretic.  HENT:     Head: Normocephalic and atraumatic.     Nose: Nose normal.  Eyes:     General: No scleral icterus.       Right eye: No discharge.        Left eye: No discharge.     Conjunctiva/sclera: Conjunctivae normal.     Pupils: Pupils are equal, round, and reactive to light.  Cardiovascular:     Rate and Rhythm: Normal rate and regular rhythm.     Heart sounds: No murmur heard.    No friction rub. No gallop.  Pulmonary:     Effort: Pulmonary effort  is normal. No respiratory distress.     Breath sounds: Normal breath sounds. No stridor. No rales.  Abdominal:     General: There is no distension.     Palpations: Abdomen is soft.     Tenderness: There is no abdominal tenderness.  Musculoskeletal:        General: No tenderness.     Cervical back: Normal range of motion and neck supple.  Skin:    General: Skin is warm and dry.     Findings: No erythema or rash.  Neurological:     Comments: Corneal reflex intact  Psychiatric:        Speech: He is noncommunicative.  Behavior: Behavior is uncooperative.     ED Results and Treatments Labs (all labs ordered are listed, but only abnormal results are displayed) Labs Reviewed - No data to display                                                                                                                       EKG  EKG Interpretation Date/Time:  Monday May 24 2023 04:04:46 EDT Ventricular Rate:  82 PR Interval:  188 QRS Duration:  117 QT Interval:  386 QTC Calculation: 451 R Axis:   66  Text Interpretation: Sinus rhythm Incomplete right bundle branch block Probable left ventricular hypertrophy Confirmed by Drema Pry [phone]) on 05/24/2023 4:52:05 AM       Radiology CT Head Wo Contrast  Result Date: 05/24/2023 CLINICAL DATA:  Mental status change, unknown cause; Neck trauma, intoxicated or obtunded (Age >= 16y) EXAM: CT HEAD WITHOUT CONTRAST CT CERVICAL SPINE WITHOUT CONTRAST TECHNIQUE: Multidetector CT imaging of the head and cervical spine was performed following the standard protocol without intravenous contrast. Multiplanar CT image reconstructions of the cervical spine were also generated. RADIATION DOSE REDUCTION: This exam was performed according to the departmental dose-optimization program which includes automated exposure control, adjustment of the mA and/or kV according to patient size and/or use of iterative reconstruction technique. COMPARISON:  CT head  and C-spine 05/04/2023 FINDINGS: CT HEAD FINDINGS Brain: No evidence of large-territorial acute infarction. No parenchymal hemorrhage. No mass lesion. No extra-axial collection. No mass effect or midline shift. No hydrocephalus. Basilar cisterns are patent. Vascular: No hyperdense vessel. Skull: No acute fracture or focal lesion. Sinuses/Orbits: Paranasal sinuses and mastoid air cells are clear. The orbits are unremarkable. Other: None. CT CERVICAL SPINE FINDINGS Alignment: Normal. Skull base and vertebrae: No acute fracture. No aggressive appearing focal osseous lesion or focal pathologic process. Soft tissues and spinal canal: No prevertebral fluid or swelling. No visible canal hematoma. Upper chest: Unremarkable. Other: None. IMPRESSION: 1. No acute intracranial abnormality. 2. No acute displaced fracture or traumatic listhesis of the cervical spine. Electronically Signed   By: Tish Frederickson M.D.   On: 05/24/2023 00:03   CT Cervical Spine Wo Contrast  Result Date: 05/24/2023 CLINICAL DATA:  Mental status change, unknown cause; Neck trauma, intoxicated or obtunded (Age >= 16y) EXAM: CT HEAD WITHOUT CONTRAST CT CERVICAL SPINE WITHOUT CONTRAST TECHNIQUE: Multidetector CT imaging of the head and cervical spine was performed following the standard protocol without intravenous contrast. Multiplanar CT image reconstructions of the cervical spine were also generated. RADIATION DOSE REDUCTION: This exam was performed according to the departmental dose-optimization program which includes automated exposure control, adjustment of the mA and/or kV according to patient size and/or use of iterative reconstruction technique. COMPARISON:  CT head and C-spine 05/04/2023 FINDINGS: CT HEAD FINDINGS Brain: No evidence of large-territorial acute infarction. No parenchymal hemorrhage. No mass lesion. No extra-axial collection. No mass effect or midline shift. No hydrocephalus. Basilar cisterns are patent. Vascular: No  hyperdense vessel. Skull: No  acute fracture or focal lesion. Sinuses/Orbits: Paranasal sinuses and mastoid air cells are clear. The orbits are unremarkable. Other: None. CT CERVICAL SPINE FINDINGS Alignment: Normal. Skull base and vertebrae: No acute fracture. No aggressive appearing focal osseous lesion or focal pathologic process. Soft tissues and spinal canal: No prevertebral fluid or swelling. No visible canal hematoma. Upper chest: Unremarkable. Other: None. IMPRESSION: 1. No acute intracranial abnormality. 2. No acute displaced fracture or traumatic listhesis of the cervical spine. Electronically Signed   By: Tish Frederickson M.D.   On: 05/24/2023 00:03   DG Pelvis Portable  Result Date: 05/23/2023 CLINICAL DATA:  LOC EXAM: PORTABLE PELVIS 1-2 VIEWS COMPARISON:  None Available. FINDINGS: There is no evidence of pelvic fracture or diastasis. No pelvic bone lesions are seen. IMPRESSION: Negative. Electronically Signed   By: Jasmine Pang M.D.   On: 05/23/2023 22:27   DG Chest Port 1 View  Result Date: 05/23/2023 CLINICAL DATA:  LOC EXAM: PORTABLE CHEST 1 VIEW COMPARISON:  07/23/2022 FINDINGS: The heart appears enlarged compared to prior and there is mild central vascular congestion. No acute airspace disease, pleural effusion, or pneumothorax. IMPRESSION: Interval cardiac enlargement compared to prior radiograph with mild central congestion Electronically Signed   By: Jasmine Pang M.D.   On: 05/23/2023 22:26    Medications Ordered in ED Medications - No data to display Procedures Procedures  (including critical care time) Medical Decision Making / ED Course   Medical Decision Making   Patient presents after being found outside by bystanders.  Initially responsive and then became unresponsive with first responders.  Noted to be breathing at a normal rate.  Put on nasal cannula.  He is hemodynamically stable.  No signs of trauma on exam.  No need for repeat CT head or other imaging.  No  evidence of angioedema.  Responds to surprising stimuli.  Favor behavioral/psychogenic episode. Will monitor for now.  Clinical Course as of 05/24/23 0606  Mon May 24, 2023  0532 Patient awake. Sating well on RA. Seen intermittently holding breath [PC]    Clinical Course User Index [PC] Dailah Opperman, Amadeo Garnet, MD   Admitted to feeling anxious.  His GF was supposed to pick him up after previously being discharge, but she did not show up.   Final Clinical Impression(s) / ED Diagnoses Final diagnoses:  Anxiety   The patient appears reasonably screened and/or stabilized for discharge and I doubt any other medical condition or other [facility] requiring further screening, evaluation, or treatment in the ED at this time. I have discussed the findings, Dx and Tx plan with the patient/family who expressed understanding and agree(s) with the plan. Discharge instructions discussed at length. The patient/family was given strict return precautions who verbalized understanding of the instructions. No further questions at time of discharge.  Disposition: Discharge  Condition: Good  ED Discharge Orders     None       Follow Up: [facility] Address: [address] [place], [place] [address] Hours: Open 24 hours Monday through Sunday Phone: [phone] Go to  as needed    This chart was dictated using voice recognition software.  Despite best efforts to proofread,  errors can occur which can change the documentation meaning.    Nira Conn, MD 05/24/23 [phone]

## 2023-05-24 NOTE — ED Notes (Signed)
Patient given orange juice and [place] sandwich.

## 2023-05-24 NOTE — ED Notes (Signed)
Pt. mother would like an update whenever you get some information

## 2023-05-24 NOTE — ED Triage Notes (Signed)
Patient arrived with EMS found unresponsive under a bridge this morning , Received Narcan 1 mg intranasal prior to arrival .

## 2023-05-24 NOTE — ED Provider Notes (Signed)
  Physical Exam  BP 129/72   Pulse 78   Temp 98.1 F (36.7 C) (Oral)   Resp 20   SpO2 100%   Physical Exam Vitals and nursing note reviewed.  Constitutional:      General: He is not in acute distress.    Appearance: He is well-developed.  HENT:     Head: Normocephalic and atraumatic.  Eyes:     Conjunctiva/sclera: Conjunctivae normal.  Cardiovascular:     Rate and Rhythm: Normal rate and regular rhythm.     Heart sounds: No murmur heard. Pulmonary:     Effort: Pulmonary effort is normal. No respiratory distress.     Breath sounds: Normal breath sounds.  Abdominal:     Palpations: Abdomen is soft.     Tenderness: There is no abdominal tenderness.  Musculoskeletal:        General: No swelling.     Cervical back: Neck supple.  Skin:    General: Skin is warm and dry.     Capillary Refill: Capillary refill takes less than 2 seconds.  Neurological:     Mental Status: He is alert.  Psychiatric:        Mood and Affect: Mood normal.     Procedures  Procedures  ED Course / MDM    Medical Decision Making Amount and/or Complexity of Data Reviewed Labs: ordered. Radiology: ordered.  Risk Prescription drug management.   Patient received in handoff.  Initial presentation with respiratory distress and seizure-like behavior.  Long previous history of nonepileptic seizures with extensive outpatient neurologic evaluation at both [place] and at [facility].  Pending CT imaging at time of signout.  CT imaging is reassuring, chest x-ray showing some possible cardiomegaly with vascular congestion.  BNP is unremarkable and patient currently not in any respiratory distress.  I had a long discussion with the patient's mother Clydie Braun and patient reportedly had been in cognitive behavioral therapy previously and his nonepileptic seizures have significant worsened since he has stopped going to therapy.  Patient recently canceled a psychiatrist appointment.  I spoke with the patient at length about  nonepileptic seizures and the need to follow-up with psychiatry.  At this time he does not meet inpatient criteria for admission and he is safe for discharge with outpatient follow-up.  Outpatient cardiology referral placed for cardiomegaly seen on chest x-ray.       Glendora Score, MD 05/24/23 [phone]

## 2023-05-24 NOTE — ED Provider Notes (Signed)
[name] AT [facility] Provider Note   CSN: [id number] Arrival date & time: 05/24/23  2116     History  Chief Complaint  Patient presents with  . Benadryl OD     Vincent Frank is a 22 y.o. male with PMH of nonepileptic seizures and TBI and several ED visits, most recently today for a psychogenic episode and discharged presenting back to the ED by EMS for being found in mud.  Patient was seen here today for a psychogenic episode and discharged this morning.  He states that he went home and had an argument with his parents and then took a walk outside in the woods.  States he took Benadryl, unable to tell me the time or how many he took, does not have the bottle with him.  Denies hallucinations.  Denies that this was suicidal attempt though does endorse passive SI.  He states that while in the woods after taking Benadryl he fell in the creek and was found by [employer].  When attending physician interviews the patient to get history, he tells a different story and states he was out doing survival training with a friend in the woods and got stuck in mud, had the cops ping his phone to find him.       Home Medications Prior to Admission medications   Medication Sig Start Date End Date Taking? Authorizing Provider  acetaminophen (TYLENOL) 325 MG tablet Take 2 tablets (650 mg total) by mouth every 6 (six) hours as needed for mild pain (or Fever >/= 101). 04/12/23   Alford Highland, MD  ARIPiprazole (ABILIFY) 2 MG tablet Take 1 tablet (2 mg total) by mouth at bedtime. Antidepressant augmentation 03/20/23   Armandina Stammer I, NP  fenofibrate (TRICOR) 145 MG tablet Take 145 mg by mouth daily. 04/30/23   [provider]  ferrous sulfate 325 (65 FE) MG tablet Take 1 tablet (325 mg total) by mouth daily with breakfast. 04/13/23   Alford Highland, MD  FLUoxetine (PROZAC) 40 MG capsule Take 1 capsule (40 mg total) by mouth at bedtime. For depression 03/20/23    Armandina Stammer I, NP  hydrOXYzine (ATARAX) 25 MG tablet Take 1 tablet (25 mg total) by mouth every 6 (six) hours as needed for anxiety. Patient taking differently: Take 25 mg by mouth as needed for anxiety. 03/20/23   Armandina Stammer I, NP  levETIRAcetam (KEPPRA) 500 MG tablet Take 500 mg by mouth 2 (two) times daily. 05/04/23 05/03/24  [provider]  polyethylene glycol (MIRALAX / GLYCOLAX) 17 g packet Take 17 g by mouth daily as needed for mild constipation. 04/12/23   Alford Highland, MD  traZODone (DESYREL) 50 MG tablet Take 1 tablet by mouth at bedtime. 05/04/23 05/03/24  [provider]      Allergies    Bee venom, Bee venom, Hornet venom, Bee venom, Gabapentin, Gabapentin, Haldol [haloperidol], Haldol [haloperidol], and Hornet venom    Review of Systems   Review of Systems as per HPI above  Physical Exam Updated Vital Signs BP (!) 148/87   Pulse 95   Temp 98.6 F (37 C) (Rectal)   Resp (!) 24   SpO2 99%  Physical Exam Constitutional:      General: He is not in acute distress.    Appearance: He is not ill-appearing.  HENT:     Head: Normocephalic and atraumatic.     Nose: Nose normal.     Mouth/Throat:     Mouth: Mucous  membranes are dry.  Eyes:     Extraocular Movements: Extraocular movements intact.     Pupils: Pupils are equal, round, and reactive to light.     Comments: Pupils are mildly dilated  Cardiovascular:     Rate and Rhythm: Normal rate and regular rhythm.     Pulses: Normal pulses.     Heart sounds: Normal heart sounds.  Pulmonary:     Effort: Pulmonary effort is normal.     Breath sounds: Normal breath sounds.  Abdominal:     General: There is no distension.     Palpations: Abdomen is soft.     Tenderness: There is no abdominal tenderness. There is no guarding.  Musculoskeletal:        General: No signs of injury.     Cervical back: Neck supple.     Right lower leg: No edema.     Left lower leg: No edema.     Comments: Covered in mud in  the bilateral lower extremities  Neurological:     Mental Status: He is alert and oriented to person, place, and time.     Sensory: No sensory deficit.     Motor: No weakness.     ED Results / Procedures / Treatments   Labs (all labs ordered are listed, but only abnormal results are displayed) Labs Reviewed  CBC WITH DIFFERENTIAL/PLATELET - Abnormal; Notable for the following components:      Result Value   WBC 14.5 (*)    Hemoglobin 12.5 (*)    MCV 79.9 (*)    MCH 25.1 (*)    Neutro Abs 12.3 (*)    All other components within normal limits  COMPREHENSIVE METABOLIC PANEL - Abnormal; Notable for the following components:   CO2 20 (*)    Creatinine, Ser 1.35 (*)    All other components within normal limits  ACETAMINOPHEN LEVEL - Abnormal; Notable for the following components:   Acetaminophen (Tylenol), Serum <10 (*)    All other components within normal limits  SALICYLATE LEVEL - Abnormal; Notable for the following components:   Salicylate Lvl <7.0 (*)    All other components within normal limits  CBG MONITORING, ED - Abnormal; Notable for the following components:   Glucose-Capillary 59 (*)    All other components within normal limits  ETHANOL  URINALYSIS, ROUTINE W REFLEX MICROSCOPIC  RAPID URINE DRUG SCREEN, HOSP PERFORMED  ACETAMINOPHEN LEVEL  CBG MONITORING, ED    EKG EKG Interpretation Date/Time:  Monday May 24 2023 21:25:49 EDT Ventricular Rate:  94 PR Interval:    QRS Duration:  120 QT Interval:  361 QTC Calculation: 452 R Axis:   77  Text Interpretation: Normal sinus rhythm IVCD, consider atypical RBBB Otherwise no significant change Confirmed by Melene Plan [phone]) on 05/24/2023 9:44:23 PM  Radiology CT Head Wo Contrast  Result Date: 05/24/2023 CLINICAL DATA:  Mental status change, unknown cause; Neck trauma, intoxicated or obtunded (Age >= 16y) EXAM: CT HEAD WITHOUT CONTRAST CT CERVICAL SPINE WITHOUT CONTRAST TECHNIQUE: Multidetector CT imaging of  the head and cervical spine was performed following the standard protocol without intravenous contrast. Multiplanar CT image reconstructions of the cervical spine were also generated. RADIATION DOSE REDUCTION: This exam was performed according to the departmental dose-optimization program which includes automated exposure control, adjustment of the mA and/or kV according to patient size and/or use of iterative reconstruction technique. COMPARISON:  CT head and C-spine 05/04/2023 FINDINGS: CT HEAD FINDINGS Brain: No evidence of large-territorial acute infarction.  No parenchymal hemorrhage. No mass lesion. No extra-axial collection. No mass effect or midline shift. No hydrocephalus. Basilar cisterns are patent. Vascular: No hyperdense vessel. Skull: No acute fracture or focal lesion. Sinuses/Orbits: Paranasal sinuses and mastoid air cells are clear. The orbits are unremarkable. Other: None. CT CERVICAL SPINE FINDINGS Alignment: Normal. Skull base and vertebrae: No acute fracture. No aggressive appearing focal osseous lesion or focal pathologic process. Soft tissues and spinal canal: No prevertebral fluid or swelling. No visible canal hematoma. Upper chest: Unremarkable. Other: None. IMPRESSION: 1. No acute intracranial abnormality. 2. No acute displaced fracture or traumatic listhesis of the cervical spine. Electronically Signed   By: Tish Frederickson M.D.   On: 05/24/2023 00:03   CT Cervical Spine Wo Contrast  Result Date: 05/24/2023 CLINICAL DATA:  Mental status change, unknown cause; Neck trauma, intoxicated or obtunded (Age >= 16y) EXAM: CT HEAD WITHOUT CONTRAST CT CERVICAL SPINE WITHOUT CONTRAST TECHNIQUE: Multidetector CT imaging of the head and cervical spine was performed following the standard protocol without intravenous contrast. Multiplanar CT image reconstructions of the cervical spine were also generated. RADIATION DOSE REDUCTION: This exam was performed according to the departmental  dose-optimization program which includes automated exposure control, adjustment of the mA and/or kV according to patient size and/or use of iterative reconstruction technique. COMPARISON:  CT head and C-spine 05/04/2023 FINDINGS: CT HEAD FINDINGS Brain: No evidence of large-territorial acute infarction. No parenchymal hemorrhage. No mass lesion. No extra-axial collection. No mass effect or midline shift. No hydrocephalus. Basilar cisterns are patent. Vascular: No hyperdense vessel. Skull: No acute fracture or focal lesion. Sinuses/Orbits: Paranasal sinuses and mastoid air cells are clear. The orbits are unremarkable. Other: None. CT CERVICAL SPINE FINDINGS Alignment: Normal. Skull base and vertebrae: No acute fracture. No aggressive appearing focal osseous lesion or focal pathologic process. Soft tissues and spinal canal: No prevertebral fluid or swelling. No visible canal hematoma. Upper chest: Unremarkable. Other: None. IMPRESSION: 1. No acute intracranial abnormality. 2. No acute displaced fracture or traumatic listhesis of the cervical spine. Electronically Signed   By: Tish Frederickson M.D.   On: 05/24/2023 00:03   DG Pelvis Portable  Result Date: 05/23/2023 CLINICAL DATA:  LOC EXAM: PORTABLE PELVIS 1-2 VIEWS COMPARISON:  None Available. FINDINGS: There is no evidence of pelvic fracture or diastasis. No pelvic bone lesions are seen. IMPRESSION: Negative. Electronically Signed   By: Jasmine Pang M.D.   On: 05/23/2023 22:27   DG Chest Port 1 View  Result Date: 05/23/2023 CLINICAL DATA:  LOC EXAM: PORTABLE CHEST 1 VIEW COMPARISON:  07/23/2022 FINDINGS: The heart appears enlarged compared to prior and there is mild central vascular congestion. No acute airspace disease, pleural effusion, or pneumothorax. IMPRESSION: Interval cardiac enlargement compared to prior radiograph with mild central congestion Electronically Signed   By: Jasmine Pang M.D.   On: 05/23/2023 22:26    Procedures Procedures     Medications Ordered in ED Medications  dextrose 50 % solution 50 mL (50 mLs Intravenous Given 05/24/23 2142)  sodium chloride 0.9 % bolus 1,000 mL (1,000 mLs Intravenous New Bag/Given 05/24/23 2247)    ED Course/ Medical Decision Making/ A&P Clinical Course as of 05/24/23 2340  Mon May 24, 2023  2232 6 hour obs from time of being found. 4 hour apap, 1 EKG before clearance, if agitation benzos dose to effect, iv fluid and make sure he pees [GD]  2336 Fluoxetine 40 mg  Fenofibrate  Trazodone 50 mg nightly  Keppra 500 bid  Abilify 2  mg for MDD  [GD]    Clinical Course User Index [GD] Karmen Stabs, MD                                 Medical Decision Making Amount and/or Complexity of Data Reviewed Independent Historian: EMS External Data Reviewed: notes.    Details: ED visit today Labs: ordered. Decision-making details documented in ED Course. ECG/medicine tests: ordered and independent interpretation performed. Decision-making details documented in ED Course.  Risk Prescription drug management. Decision regarding hospitalization.   22 year old male who presents after being found outdoors unclear story as to what happened before being found.  Possible Benadryl ingestion reported though amount and time of ingestion unknown; intent of ingestion is also unclear at this time.  Initially hemodynamically stable with normal temperature of 37 Celsius.  He is diffusely tremulous on exam with mid dilated pupils and dry mucous membranes, potentially consistent with anticholinergic toxidrome/reported benadryl ingestion.  He is unable to provide a consistent story as to what happened to bring him in which could be part of this toxidrome.  He is not hallucinating however.  Elopement precautions were placed as it is unclear if this was a suicide attempt.   Initial point-of-care glucose is mildly hypoglycemic at 59.  Patient was given D5 and repeat glucose normal.    EKG was obtained and  notable for normal sinus rhythm rate of 94, normal axis and intervals, no ischemic changes, normal QRS width and QTc duration (120, 452), no heart block.   Blood labs including serum and urine toxicology were obtained and notable for a leukocytosis of 14.5, hemoglobin 12.5, normal electrolytes, creatinine of 1.35 near prior, normal LFTs, negative serum tox (4-hour APAP in process), UA and urine tox pending sample collection  I discussed the patient with poison control.  They recommended a 6-hour obs from the time he was found which would be until 3 AM. Repeat EKG before he leaves, and obtain 4 hour APAP. Benzos if agitated. Fluids ordered. Must urinate before he goes per poison control.   Plan at signout is to medically clear at 3 AM with poison control recs as above, then reassess patient for motive and consult psychiatry given his passive SI and concern this was a suicide attempt.  Care was handed to oncoming team.  Please see their note for further detail medical decision making.          Final Clinical Impression(s) / ED Diagnoses Final diagnoses:  Drug overdose of undetermined intent, initial encounter    Rx / DC Orders ED Discharge Orders     None         Karmen Stabs, MD 05/24/23 2310    Melene Plan, DO 05/24/23 2331

## 2023-05-24 NOTE — ED Notes (Addendum)
Patient alert and speaking with EDP at bedside , plan of care explained by MD , tolerated sips of water . Respirations unlabored . Paper scrubs /non slip socks/slippers given to patient .

## 2023-05-24 NOTE — ED Triage Notes (Signed)
Patient arrived with EMS found in the woods lying on mud , patient took unknown amount of Benadryl this evening . Alert and oriented at arrival/respirations unlabored .

## 2023-05-24 NOTE — ED Notes (Signed)
Patient removed his IV and monitor leads , put his clothes on and advised RN that he is ready to go home , EDP notified , ordered Ativan 2 mg IM . IVC by EDP.

## 2023-05-25 ENCOUNTER — Emergency Department (HOSPITAL_COMMUNITY): Payer: BC Managed Care – PPO

## 2023-05-25 ENCOUNTER — Other Ambulatory Visit: Payer: Self-pay

## 2023-05-25 ENCOUNTER — Inpatient Hospital Stay (HOSPITAL_COMMUNITY)
Admission: AD | Admit: 2023-05-25 | Discharge: 2023-06-01 | DRG: 885 | Disposition: A | Discharge status: Rehabilitation Facility | Payer: BC Managed Care – PPO | Source: Intra-hospital | Attending: Emergency Medicine | Admitting: Emergency Medicine

## 2023-05-25 ENCOUNTER — Encounter (HOSPITAL_COMMUNITY): Payer: Self-pay | Admitting: Nurse Practitioner

## 2023-05-25 DIAGNOSIS — Z91030 Bee allergy status: Secondary | ICD-10-CM

## 2023-05-25 DIAGNOSIS — K5900 Constipation, unspecified: Secondary | ICD-10-CM | POA: Diagnosis present

## 2023-05-25 DIAGNOSIS — E559 Vitamin D deficiency, unspecified: Secondary | ICD-10-CM | POA: Diagnosis present

## 2023-05-25 DIAGNOSIS — Z5982 Transportation insecurity: Secondary | ICD-10-CM

## 2023-05-25 DIAGNOSIS — F339 Major depressive disorder, recurrent, unspecified: Secondary | ICD-10-CM | POA: Diagnosis not present

## 2023-05-25 DIAGNOSIS — Z818 Family history of other mental and behavioral disorders: Secondary | ICD-10-CM

## 2023-05-25 DIAGNOSIS — G4700 Insomnia, unspecified: Secondary | ICD-10-CM | POA: Diagnosis present

## 2023-05-25 DIAGNOSIS — F329 Major depressive disorder, single episode, unspecified: Principal | ICD-10-CM | POA: Diagnosis present

## 2023-05-25 DIAGNOSIS — Z79899 Other long term (current) drug therapy: Secondary | ICD-10-CM

## 2023-05-25 DIAGNOSIS — Z9152 Personal history of nonsuicidal self-harm: Secondary | ICD-10-CM

## 2023-05-25 DIAGNOSIS — Z888 Allergy status to other drugs, medicaments and biological substances status: Secondary | ICD-10-CM | POA: Diagnosis not present

## 2023-05-25 DIAGNOSIS — F445 Conversion disorder with seizures or convulsions: Secondary | ICD-10-CM | POA: Diagnosis present

## 2023-05-25 DIAGNOSIS — F332 Major depressive disorder, recurrent severe without psychotic features: Secondary | ICD-10-CM | POA: Diagnosis not present

## 2023-05-25 DIAGNOSIS — F411 Generalized anxiety disorder: Secondary | ICD-10-CM | POA: Diagnosis present

## 2023-05-25 DIAGNOSIS — R339 Retention of urine, unspecified: Secondary | ICD-10-CM | POA: Diagnosis not present

## 2023-05-25 DIAGNOSIS — Z9151 Personal history of suicidal behavior: Secondary | ICD-10-CM | POA: Diagnosis not present

## 2023-05-25 DIAGNOSIS — Z9181 History of falling: Secondary | ICD-10-CM | POA: Diagnosis not present

## 2023-05-25 DIAGNOSIS — R569 Unspecified convulsions: Principal | ICD-10-CM

## 2023-05-25 DIAGNOSIS — G40909 Epilepsy, unspecified, not intractable, without status epilepticus: Secondary | ICD-10-CM | POA: Diagnosis present

## 2023-05-25 DIAGNOSIS — R064 Hyperventilation: Secondary | ICD-10-CM | POA: Diagnosis not present

## 2023-05-25 DIAGNOSIS — Z885 Allergy status to narcotic agent status: Secondary | ICD-10-CM | POA: Diagnosis not present

## 2023-05-25 LAB — URINALYSIS, ROUTINE W REFLEX MICROSCOPIC
Bilirubin Urine: NEGATIVE
Glucose, UA: 50 mg/dL — AB
Hgb urine dipstick: NEGATIVE
Ketones, ur: NEGATIVE mg/dL
Leukocytes,Ua: NEGATIVE
Nitrite: NEGATIVE
Protein, ur: NEGATIVE mg/dL
Specific Gravity, Urine: 1.013 (ref 1.005–1.030)
pH: 5 (ref 5.0–8.0)

## 2023-05-25 LAB — CBC WITH DIFFERENTIAL/PLATELET
Abs Immature Granulocytes: 0.03 10*3/uL (ref 0.00–0.07)
Basophils Absolute: 0 10*3/uL (ref 0.0–0.1)
Basophils Relative: 0 %
Eosinophils Absolute: 0.2 10*3/uL (ref 0.0–0.5)
Eosinophils Relative: 3 %
HCT: 29.8 % — ABNORMAL LOW (ref 39.0–52.0)
Hemoglobin: 9.4 g/dL — ABNORMAL LOW (ref 13.0–17.0)
Immature Granulocytes: 0 %
Lymphocytes Relative: 16 %
Lymphs Abs: 1.2 10*3/uL (ref 0.7–4.0)
MCH: 25.5 pg — ABNORMAL LOW (ref 26.0–34.0)
MCHC: 31.5 g/dL (ref 30.0–36.0)
MCV: 81 fL (ref 80.0–100.0)
Monocytes Absolute: 0.7 10*3/uL (ref 0.1–1.0)
Monocytes Relative: 10 %
Neutro Abs: 5 10*3/uL (ref 1.7–7.7)
Neutrophils Relative %: 71 %
Platelets: 187 10*3/uL (ref 150–400)
RBC: 3.68 MIL/uL — ABNORMAL LOW (ref 4.22–5.81)
RDW: 12.5 % (ref 11.5–15.5)
WBC: 7.1 10*3/uL (ref 4.0–10.5)
nRBC: 0 % (ref 0.0–0.2)

## 2023-05-25 LAB — BASIC METABOLIC PANEL
Anion gap: 7 (ref 5–15)
BUN: 11 mg/dL (ref 6–20)
CO2: 21 mmol/L — ABNORMAL LOW (ref 22–32)
Calcium: 8.8 mg/dL — ABNORMAL LOW (ref 8.9–10.3)
Chloride: 109 mmol/L (ref 98–111)
Creatinine, Ser: 1.36 mg/dL — ABNORMAL HIGH (ref 0.61–1.24)
GFR, Estimated: >60 mL/min (ref >60)
Glucose, Bld: 86 mg/dL (ref 70–99)
Potassium: 3.7 mmol/L (ref 3.5–5.1)
Sodium: 137 mmol/L (ref 135–145)

## 2023-05-25 LAB — I-STAT CG4 LACTIC ACID, ED: Lactic Acid, Venous: 0.8 mmol/L (ref 0.5–1.9)

## 2023-05-25 LAB — RAPID URINE DRUG SCREEN, HOSP PERFORMED
Amphetamines: NOT DETECTED
Barbiturates: NOT DETECTED
Benzodiazepines: POSITIVE — AB
Cocaine: NOT DETECTED
Opiates: NOT DETECTED
Tetrahydrocannabinol: NOT DETECTED

## 2023-05-25 LAB — ACETAMINOPHEN LEVEL: Acetaminophen (Tylenol), Serum: <10 ug/mL — ABNORMAL LOW (ref 10–30)

## 2023-05-25 MED ORDER — ALUM & MAG HYDROXIDE-SIMETH 200-200-20 MG/5ML PO SUSP
30.0000 mL | ORAL | Status: DC | PRN
  Filled 2023-05-25: qty 30

## 2023-05-25 MED ORDER — LORAZEPAM 1 MG PO TABS: 2.0000 mg | ORAL_TABLET | Freq: Once | ORAL | Status: DC

## 2023-05-25 MED ORDER — LORAZEPAM 1 MG PO TABS
2.0000 mg | ORAL_TABLET | Freq: Once | ORAL | Status: AC
Start: 2023-05-25 — End: 2023-05-25
  Administered 2023-05-25: 2 mg via ORAL
  Filled 2023-05-25: qty 2

## 2023-05-25 MED ORDER — ACETAMINOPHEN 325 MG PO TABS
650.0000 mg | ORAL_TABLET | Freq: Four times a day (QID) | ORAL | Status: DC | PRN
  Administered 2023-05-26: 650 mg via ORAL
  Filled 2023-05-25: qty 2

## 2023-05-25 MED ORDER — LORAZEPAM 1 MG PO TABS: 2.0000 mg | ORAL_TABLET | Freq: Three times a day (TID) | ORAL | Status: DC | PRN

## 2023-05-25 MED ORDER — ARIPIPRAZOLE 2 MG PO TABS
2.0000 mg | ORAL_TABLET | Freq: Every day | ORAL | Status: DC
  Filled 2023-05-25: qty 1

## 2023-05-25 MED ORDER — LEVETIRACETAM 500 MG PO TABS
500.0000 mg | ORAL_TABLET | Freq: Two times a day (BID) | ORAL | Status: DC
  Administered 2023-05-25 – 2023-05-31 (×13): 500 mg via ORAL
  Filled 2023-05-25 (×19): qty 1

## 2023-05-25 MED ORDER — MAGNESIUM HYDROXIDE 400 MG/5ML PO SUSP: 30.0000 mL | Freq: Every day | ORAL | Status: DC | PRN

## 2023-05-25 MED ORDER — ONDANSETRON 4 MG PO TBDP
4.0000 mg | ORAL_TABLET | Freq: Once | ORAL | Status: AC
  Administered 2023-05-25: 4 mg via ORAL
  Filled 2023-05-25: qty 1

## 2023-05-25 MED ORDER — FLUOXETINE HCL 20 MG PO CAPS
40.0000 mg | ORAL_CAPSULE | Freq: Every day | ORAL | Status: DC
  Filled 2023-05-25 (×2): qty 2

## 2023-05-25 MED ORDER — DIPHENHYDRAMINE HCL 25 MG PO CAPS: 50.0000 mg | ORAL_CAPSULE | Freq: Three times a day (TID) | ORAL | Status: DC | PRN

## 2023-05-25 MED ORDER — LEVETIRACETAM 500 MG PO TABS
500.0000 mg | ORAL_TABLET | Freq: Two times a day (BID) | ORAL | Status: DC
  Administered 2023-05-25: 500 mg via ORAL
  Filled 2023-05-25: qty 1

## 2023-05-25 MED ORDER — FLUOXETINE HCL 20 MG PO CAPS: 40.0000 mg | ORAL_CAPSULE | Freq: Every day | ORAL | Status: DC

## 2023-05-25 MED ORDER — ARIPIPRAZOLE 2 MG PO TABS
2.0000 mg | ORAL_TABLET | Freq: Every day | ORAL | Status: DC
  Filled 2023-05-25 (×2): qty 1

## 2023-05-25 MED ORDER — DIPHENHYDRAMINE HCL 50 MG/ML IJ SOLN: 50.0000 mg | Freq: Three times a day (TID) | INTRAMUSCULAR | Status: DC | PRN

## 2023-05-25 MED ORDER — TRAZODONE HCL 50 MG PO TABS: 50.0000 mg | ORAL_TABLET | Freq: Every day | ORAL | Status: DC | PRN

## 2023-05-25 MED ORDER — HYDROXYZINE HCL 25 MG PO TABS
25.0000 mg | ORAL_TABLET | Freq: Three times a day (TID) | ORAL | Status: DC | PRN
  Administered 2023-05-26: 25 mg via ORAL
  Filled 2023-05-25: qty 1

## 2023-05-25 MED ORDER — TRAZODONE HCL 50 MG PO TABS
50.0000 mg | ORAL_TABLET | Freq: Every day | ORAL | Status: DC | PRN
  Administered 2023-05-26: 50 mg via ORAL
  Filled 2023-05-25: qty 1

## 2023-05-25 MED ORDER — LORAZEPAM 2 MG/ML IJ SOLN
2.0000 mg | Freq: Three times a day (TID) | INTRAMUSCULAR | Status: DC | PRN
  Administered 2023-05-25 – 2023-06-01 (×2): 2 mg via INTRAMUSCULAR
  Filled 2023-05-25 (×3): qty 1

## 2023-05-25 MED ORDER — AMMONIA AROMATIC IN INHA
RESPIRATORY_TRACT | Status: AC
  Filled 2023-05-25: qty 10

## 2023-05-25 MED ORDER — LORAZEPAM 2 MG/ML PO CONC
2.0000 mg | Freq: Once | ORAL | Status: DC
  Administered 2023-05-25: 2 mg

## 2023-05-25 NOTE — Plan of Care (Signed)

## 2023-05-25 NOTE — ED Notes (Signed)
Mother updated as to patient's status.

## 2023-05-25 NOTE — Progress Notes (Signed)
Writer was notify by nursing staff at [facility] the patient was having seizure activities lasting close to 4 minutes.  According to nursing staff patient had received 2 mg of Ativan but still having seizure activity on and off.  Writer did call over to [facility] spoke with Dr. TEE MD who will be accepting the patient for medical clearance.  New order was given for Ativan 2 mg if patient continues to sees prior to EMS arriving.

## 2023-05-25 NOTE — ED Notes (Signed)
IVC documentation delivered to [facility]

## 2023-05-25 NOTE — ED Notes (Signed)
Pt noted to be tachypneic  with eyes closed, equal rise and fall of chest noted. Pt c/o dizziness. Sitter present at bedside. Dr Delight Hoh aware, new order for chest x-ray received at this time.

## 2023-05-25 NOTE — ED Notes (Signed)
Unable to give urine specimen at this time .  

## 2023-05-25 NOTE — ED Provider Notes (Signed)
EMERGENCY DEPARTMENT AT [facility] Provider Note   CSN: [id number] Arrival date & time: 05/25/23  2141     History {Add pertinent medical, surgical, social history, OB history to HPI:1} Chief Complaint  Patient presents with   Seizures    Vincent Frank is a 22 y.o. male.   Seizures      Home Medications Prior to Admission medications   Medication Sig Start Date End Date Taking? Authorizing Provider  acetaminophen (TYLENOL) 325 MG tablet Take 2 tablets (650 mg total) by mouth every 6 (six) hours as needed for mild pain (or Fever >/= 101). Patient not taking: Reported on 05/25/2023 04/12/23   Alford Highland, MD  ARIPiprazole (ABILIFY) 2 MG tablet Take 1 tablet (2 mg total) by mouth at bedtime. Antidepressant augmentation 03/20/23   Armandina Stammer I, NP  fenofibrate (TRICOR) 145 MG tablet Take 145 mg by mouth daily. 04/30/23   [provider]  ferrous sulfate 325 (65 FE) MG tablet Take 1 tablet (325 mg total) by mouth daily with breakfast. Patient not taking: Reported on 05/25/2023 04/13/23   Alford Highland, MD  FLUoxetine (PROZAC) 40 MG capsule Take 1 capsule (40 mg total) by mouth at bedtime. For depression 03/20/23   Armandina Stammer I, NP  hydrOXYzine (ATARAX) 25 MG tablet Take 1 tablet (25 mg total) by mouth every 6 (six) hours as needed for anxiety. Patient not taking: Reported on 05/25/2023 03/20/23   Armandina Stammer I, NP  levETIRAcetam (KEPPRA) 500 MG tablet Take 500 mg by mouth 2 (two) times daily. 05/04/23 05/03/24  [provider]  polyethylene glycol (MIRALAX / GLYCOLAX) 17 g packet Take 17 g by mouth daily as needed for mild constipation. Patient not taking: Reported on 05/25/2023 04/12/23   Alford Highland, MD  traZODone (DESYREL) 50 MG tablet Take 1 tablet by mouth daily as needed for sleep. 05/04/23 05/03/24  [provider]      Allergies    Bee venom, Haldol [haloperidol], Hornet venom, and Gabapentin    Review of  Systems   Review of Systems  Neurological:  Positive for seizures.    Physical Exam Updated Vital Signs BP (!) 129/48   Pulse (!) 116   Temp 98.4 F (36.9 C) (Oral)   Resp (!) 24   Ht 6\' 3"  (1.905 m)   Wt 107.5 kg   SpO2 (!) 88%   BMI 29.62 kg/m  Physical Exam  ED Results / Procedures / Treatments   Labs (all labs ordered are listed, but only abnormal results are displayed) Labs Reviewed  CBC WITH DIFFERENTIAL/PLATELET  BASIC METABOLIC PANEL  CBG MONITORING, ED  I-STAT CG4 LACTIC ACID, ED    EKG None  Radiology DG Chest Portable 1 View  Result Date: 05/25/2023 CLINICAL DATA:  Shortness of breath. EXAM: PORTABLE CHEST 1 VIEW COMPARISON:  Chest radiograph dated May 23, 2023 FINDINGS: Low lung volumes. The heart size appears enlarged, similar to the prior exam. The mediastinal contours are unchanged. Similar bilateral interstitial prominence. No pneumothorax or sizable pleural effusion. The visualized skeletal structures are unchanged. IMPRESSION: Low lung volumes with similar appearance of cardiac enlargement and bilateral interstitial prominence which could represent mild central vascular congestion or atypical infection. Electronically Signed   By: Hart Robinsons M.D.   On: 05/25/2023 08:49   CT Head Wo Contrast  Result Date: 05/24/2023 CLINICAL DATA:  Mental status change, unknown cause; Neck trauma, intoxicated or obtunded (Age >= 16y) EXAM: CT HEAD WITHOUT CONTRAST CT CERVICAL  SPINE WITHOUT CONTRAST TECHNIQUE: Multidetector CT imaging of the head and cervical spine was performed following the standard protocol without intravenous contrast. Multiplanar CT image reconstructions of the cervical spine were also generated. RADIATION DOSE REDUCTION: This exam was performed according to the departmental dose-optimization program which includes automated exposure control, adjustment of the mA and/or kV according to patient size and/or use of iterative reconstruction  technique. COMPARISON:  CT head and C-spine 05/04/2023 FINDINGS: CT HEAD FINDINGS Brain: No evidence of large-territorial acute infarction. No parenchymal hemorrhage. No mass lesion. No extra-axial collection. No mass effect or midline shift. No hydrocephalus. Basilar cisterns are patent. Vascular: No hyperdense vessel. Skull: No acute fracture or focal lesion. Sinuses/Orbits: Paranasal sinuses and mastoid air cells are clear. The orbits are unremarkable. Other: None. CT CERVICAL SPINE FINDINGS Alignment: Normal. Skull base and vertebrae: No acute fracture. No aggressive appearing focal osseous lesion or focal pathologic process. Soft tissues and spinal canal: No prevertebral fluid or swelling. No visible canal hematoma. Upper chest: Unremarkable. Other: None. IMPRESSION: 1. No acute intracranial abnormality. 2. No acute displaced fracture or traumatic listhesis of the cervical spine. Electronically Signed   By: Tish Frederickson M.D.   On: 05/24/2023 00:03   CT Cervical Spine Wo Contrast  Result Date: 05/24/2023 CLINICAL DATA:  Mental status change, unknown cause; Neck trauma, intoxicated or obtunded (Age >= 16y) EXAM: CT HEAD WITHOUT CONTRAST CT CERVICAL SPINE WITHOUT CONTRAST TECHNIQUE: Multidetector CT imaging of the head and cervical spine was performed following the standard protocol without intravenous contrast. Multiplanar CT image reconstructions of the cervical spine were also generated. RADIATION DOSE REDUCTION: This exam was performed according to the departmental dose-optimization program which includes automated exposure control, adjustment of the mA and/or kV according to patient size and/or use of iterative reconstruction technique. COMPARISON:  CT head and C-spine 05/04/2023 FINDINGS: CT HEAD FINDINGS Brain: No evidence of large-territorial acute infarction. No parenchymal hemorrhage. No mass lesion. No extra-axial collection. No mass effect or midline shift. No hydrocephalus. Basilar cisterns  are patent. Vascular: No hyperdense vessel. Skull: No acute fracture or focal lesion. Sinuses/Orbits: Paranasal sinuses and mastoid air cells are clear. The orbits are unremarkable. Other: None. CT CERVICAL SPINE FINDINGS Alignment: Normal. Skull base and vertebrae: No acute fracture. No aggressive appearing focal osseous lesion or focal pathologic process. Soft tissues and spinal canal: No prevertebral fluid or swelling. No visible canal hematoma. Upper chest: Unremarkable. Other: None. IMPRESSION: 1. No acute intracranial abnormality. 2. No acute displaced fracture or traumatic listhesis of the cervical spine. Electronically Signed   By: Tish Frederickson M.D.   On: 05/24/2023 00:03   DG Pelvis Portable  Result Date: 05/23/2023 CLINICAL DATA:  LOC EXAM: PORTABLE PELVIS 1-2 VIEWS COMPARISON:  None Available. FINDINGS: There is no evidence of pelvic fracture or diastasis. No pelvic bone lesions are seen. IMPRESSION: Negative. Electronically Signed   By: Jasmine Pang M.D.   On: 05/23/2023 22:27   DG Chest Port 1 View  Result Date: 05/23/2023 CLINICAL DATA:  LOC EXAM: PORTABLE CHEST 1 VIEW COMPARISON:  07/23/2022 FINDINGS: The heart appears enlarged compared to prior and there is mild central vascular congestion. No acute airspace disease, pleural effusion, or pneumothorax. IMPRESSION: Interval cardiac enlargement compared to prior radiograph with mild central congestion Electronically Signed   By: Jasmine Pang M.D.   On: 05/23/2023 22:26    Procedures Procedures  {Document cardiac monitor, telemetry assessment procedure when appropriate:1}  Medications Ordered in ED Medications  ARIPiprazole (ABILIFY) tablet 2 mg (  has no administration in time range)  FLUoxetine (PROZAC) capsule 40 mg (has no administration in time range)  levETIRAcetam (KEPPRA) tablet 500 mg (500 mg Oral Given 05/25/23 1735)  traZODone (DESYREL) tablet 50 mg (has no administration in time range)  acetaminophen (TYLENOL) tablet  650 mg (has no administration in time range)  alum & mag hydroxide-simeth (MAALOX/MYLANTA) 200-200-20 MG/5ML suspension 30 mL (has no administration in time range)  magnesium hydroxide (MILK OF MAGNESIA) suspension 30 mL (has no administration in time range)  LORazepam (ATIVAN) tablet 2 mg ( Oral See Alternative 05/25/23 2045)    Or  LORazepam (ATIVAN) injection 2 mg (2 mg Intramuscular Not Given 05/25/23 2045)  diphenhydrAMINE (BENADRYL) capsule 50 mg (has no administration in time range)    Or  diphenhydrAMINE (BENADRYL) injection 50 mg (has no administration in time range)  hydrOXYzine (ATARAX) tablet 25 mg (has no administration in time range)  LORazepam (ATIVAN) tablet 2 mg (has no administration in time range)    ED Course/ Medical Decision Making/ A&P   {   Click here for ABCD2, HEART and other calculatorsREFRESH Note before signing :1}                              Medical Decision Making Amount and/or Complexity of Data Reviewed Labs: ordered.   ***  {Document critical care time when appropriate:1} {Document review of labs and clinical decision tools ie heart score, Chads2Vasc2 etc:1}  {Document your independent review of radiology images, and any outside records:1} {Document your discussion with family members, caretakers, and with consultants:1} {Document social determinants of health affecting pt's care:1} {Document your decision making why or why not admission, treatments were needed:1} Final Clinical Impression(s) / ED Diagnoses Final diagnoses:  None    Rx / DC Orders ED Discharge Orders     None
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
## 2023-05-25 NOTE — Progress Notes (Signed)
Pt has been accepted to [facility] on 05/25/2023 Bed assignment: 307-1   Pt meets inpatient criteria per Eligha Bridegroom NP  Attending Physician will be Dr. Sarita Bottom, MD   Report can be called to: Adult unit: [phone]   Pt can arrive anytime  Care Team Notified: [facility]  Rona Ravens NP,Eric Isbanioly RN, Bobette Mo RN, Malva Limes RN, Hansen Hendra LCSW    [place] Alanda Colton LCSW-A   05/25/2023 10:31 AM

## 2023-05-25 NOTE — Progress Notes (Signed)
Adm note.  Pt is a 69 y CM adm under IVC to [facility] from [facility]. Pt reportedly took " twenty five, benadryl " by mouth yesterday evening.  He states that he then went for a walk on a trail and "  got stuck past his knees in mud".  Pt  has pressured affect at time of admission he is in scrubs.  Pt has noted tremors but is able to sign consents and forms.  Pt was searched and skin assessment performed.  He was brought onto the milieu and given a snack and juice. Pt then went and laid down without incident.   Q44min checks initiated for safety.

## 2023-05-25 NOTE — Group Note (Signed)
Date:  05/25/2023 Time:  9:43 PM  Group Topic/Focus:  Wrap-Up Group:   The focus of this group is to help patients review their daily goal of treatment and discuss progress on daily workbooks.    Participation Level:  Did Not Attend  Participation Quality:   N/A  Affect:   N/A  Cognitive:   N/A  Insight: None  Engagement in Group:   N/A  Modes of Intervention:   N/A  Additional Comments:  Patient did not attend wrap up group.   Kennieth Francois 05/25/2023, 9:43 PM
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
## 2023-05-25 NOTE — ED Notes (Signed)
Pt report given to [place], [facility]

## 2023-05-25 NOTE — ED Notes (Signed)
Poison Control called, side effects of Benadryl may still be working ie. Tachycardia, agitation, confusion, hallucinations.  Per poison control, continue to monitor patient, hydrate and for agitation or hallucinations give Benzos such as Ativan or Versed.

## 2023-05-25 NOTE — ED Triage Notes (Addendum)
Pt BIB EMS from [facility] with reports of seizures. It is reported that pt took an unknown amount of Benadryl. Pt was given 4 mg of ativan. Pt started seizing with ems and was given 2.5 of versed. Pt has a 18 g to his left ac. Pt is currently sleeping.

## 2023-05-25 NOTE — ED Notes (Signed)
Poison control updated as to patient's status.

## 2023-05-25 NOTE — ED Notes (Signed)
Tele psych at bedside for assessment 

## 2023-05-25 NOTE — ED Notes (Signed)
IVC papers: CASE: [id number] 3 copies placed on clipboard in [place] zone Original in red folder

## 2023-05-25 NOTE — ED Notes (Signed)
Personal belongings/clothes inventoried and placed in [place] at pod purple . Patient given a shower / paper scrubs .

## 2023-05-25 NOTE — ED Notes (Signed)
Mr Zajac is c/o nausea and abdominal pain (achy, stabbing pain). He is still a bit shaky & agitated.  Nicanor Alcon, MD notified

## 2023-05-26 ENCOUNTER — Encounter (HOSPITAL_COMMUNITY): Payer: Self-pay

## 2023-05-26 LAB — CBG MONITORING, ED: Glucose-Capillary: 88 mg/dL (ref 70–99)

## 2023-05-26 MED ORDER — FLUOXETINE HCL 20 MG PO CAPS
60.0000 mg | ORAL_CAPSULE | Freq: Every day | ORAL | Status: DC
  Administered 2023-05-27 – 2023-05-31 (×5): 60 mg via ORAL
  Filled 2023-05-26 (×9): qty 3

## 2023-05-26 MED ORDER — ZIPRASIDONE HCL 20 MG PO CAPS: 20.0000 mg | ORAL_CAPSULE | Freq: Three times a day (TID) | ORAL | Status: DC | PRN

## 2023-05-26 MED ORDER — ZIPRASIDONE MESYLATE 20 MG IM SOLR: 20.0000 mg | Freq: Three times a day (TID) | INTRAMUSCULAR | Status: DC | PRN

## 2023-05-26 MED ORDER — ARIPIPRAZOLE 5 MG PO TABS
5.0000 mg | ORAL_TABLET | Freq: Every day | ORAL | Status: DC
  Administered 2023-05-26 – 2023-05-31 (×6): 5 mg via ORAL
  Filled 2023-05-26 (×9): qty 1

## 2023-05-26 NOTE — ED Notes (Signed)
Patient able to follow commands and able to stand up.

## 2023-05-26 NOTE — Discharge Instructions (Addendum)
Your workup today was reassuring.  Please go back for psychiatric treatment.

## 2023-05-26 NOTE — Progress Notes (Signed)
Pt is currently in bed resting with eyes closed.  1:1 Sitter at bedside.   [name] breathing is even and unlabored.  Staff will cont to monitor for safety.

## 2023-05-26 NOTE — Group Note (Signed)
Recreation Therapy Group Note   Group Topic:Other  Group Date: 05/26/2023 Start Time: 0920 End Time: 1012 Facilitators: Kiondre Grenz-McCall, LRT,CTRS Location: [address] Dayroom   Group Topic: Self-Expression  Goal Area(s) Addresses:  Patient will successfully identify positive attributes about themselves.  Patient will identify healthy ways to express feelings. Patient will acknowledge benefit(s) of improved self-expression.   Activity: Quarry manager. Patients were given card stock paper and individual watercolor sets. Patients were to use the supplies given to create pictures express what they are feeling or thinking.  Education: Self-Expression Discharge Planning  Education Outcome: Acknowledges education/In group clarification offered/Needs additional education   Affect/Mood: N/A   Participation Level: Did not attend    Clinical Observations/Individualized Feedback:     Plan: Continue to engage patient in RT group sessions 2-3x/week.   Vincent Frank, LRT,CTRS 05/26/2023 1:05 PM

## 2023-05-26 NOTE — BH IP Treatment Plan (Signed)
Interdisciplinary Treatment and Diagnostic Plan Initial  05/26/2023 Time of Session: 1130 Vincent Frank MRN: [id number]  Principal Diagnosis: MDD (major depressive disorder)  Secondary Diagnoses: Principal Problem:   MDD (major depressive disorder)   Current Medications:  Current Facility-Administered Medications  Medication Dose Route Frequency Provider Last Rate Last Admin   acetaminophen (TYLENOL) tablet 650 mg  650 mg Oral Q6H PRN Eligha Bridegroom, NP       alum & mag hydroxide-simeth (MAALOX/MYLANTA) 200-200-20 MG/5ML suspension 30 mL  30 mL Oral Q4H PRN Eligha Bridegroom, NP       ARIPiprazole (ABILIFY) tablet 5 mg  5 mg Oral QHS Kizzie Ide B, MD       diphenhydrAMINE (BENADRYL) capsule 50 mg  50 mg Oral TID PRN Eligha Bridegroom, NP       Or   diphenhydrAMINE (BENADRYL) injection 50 mg  50 mg Intramuscular TID PRN Eligha Bridegroom, NP       [START ON 05/27/2023] FLUoxetine (PROZAC) capsule 60 mg  60 mg Oral Daily Kizzie Ide B, MD       hydrOXYzine (ATARAX) tablet 25 mg  25 mg Oral TID PRN Eligha Bridegroom, NP       levETIRAcetam (KEPPRA) tablet 500 mg  500 mg Oral BID Eligha Bridegroom, NP   500 mg at 05/26/23 0803   LORazepam (ATIVAN) tablet 2 mg  2 mg Oral TID PRN Eligha Bridegroom, NP       Or   LORazepam (ATIVAN) injection 2 mg  2 mg Intramuscular TID PRN Eligha Bridegroom, NP   2 mg at 05/25/23 2040   LORazepam (ATIVAN) tablet 2 mg  2 mg Per Tube Once [place]eng[place], [name], MD       magnesium hydroxide (MILK OF MAGNESIA) suspension 30 mL  30 mL Oral Daily PRN Eligha Bridegroom, NP       traZODone (DESYREL) tablet 50 mg  50 mg Oral Daily PRN Eligha Bridegroom, NP       PTA Medications: Medications Prior to Admission  Medication Sig Dispense Refill Last Dose   acetaminophen (TYLENOL) 325 MG tablet Take 2 tablets (650 mg total) by mouth every 6 (six) hours as needed for mild pain (or Fever >/= 101). (Patient not taking: Reported on 05/25/2023)      ARIPiprazole  (ABILIFY) 2 MG tablet Take 1 tablet (2 mg total) by mouth at bedtime. Antidepressant augmentation 30 tablet 0    fenofibrate (TRICOR) 145 MG tablet Take 145 mg by mouth daily.      ferrous sulfate 325 (65 FE) MG tablet Take 1 tablet (325 mg total) by mouth daily with breakfast. (Patient not taking: Reported on 05/25/2023)      FLUoxetine (PROZAC) 40 MG capsule Take 1 capsule (40 mg total) by mouth at bedtime. For depression 30 capsule 0    hydrOXYzine (ATARAX) 25 MG tablet Take 1 tablet (25 mg total) by mouth every 6 (six) hours as needed for anxiety. (Patient not taking: Reported on 05/25/2023) 75 tablet 0    levETIRAcetam (KEPPRA) 500 MG tablet Take 500 mg by mouth 2 (two) times daily.      polyethylene glycol (MIRALAX / GLYCOLAX) 17 g packet Take 17 g by mouth daily as needed for mild constipation. (Patient not taking: Reported on 05/25/2023) 14 each 0    traZODone (DESYREL) 50 MG tablet Take 1 tablet by mouth daily as needed for sleep.       Patient Stressors:    Patient Strengths:    Treatment Modalities: Medication Management, Group  therapy, Case management,  1 to 1 session with clinician, Psychoeducation, Recreational therapy.   Physician Treatment Plan for Primary Diagnosis: MDD (major depressive disorder) Long Term Goal(s):     Short Term Goals: Ability to identify changes in lifestyle to reduce recurrence of condition will improve Ability to verbalize feelings will improve Ability to disclose and discuss suicidal ideas Ability to demonstrate self-control will improve Ability to identify and develop effective coping behaviors will improve Ability to maintain clinical measurements within normal limits will improve Compliance with prescribed medications will improve Ability to identify triggers associated with substance abuse/mental health issues will improve  Medication Management: Evaluate patient's response, side effects, and tolerance of medication regimen.  Therapeutic  Interventions: 1 to 1 sessions, Unit Group sessions and Medication administration.  Evaluation of Outcomes: Progressing  Physician Treatment Plan for Secondary Diagnosis: Principal Problem:   MDD (major depressive disorder)  Long Term Goal(s):     Short Term Goals: Ability to identify changes in lifestyle to reduce recurrence of condition will improve Ability to verbalize feelings will improve Ability to disclose and discuss suicidal ideas Ability to demonstrate self-control will improve Ability to identify and develop effective coping behaviors will improve Ability to maintain clinical measurements within normal limits will improve Compliance with prescribed medications will improve Ability to identify triggers associated with substance abuse/mental health issues will improve     Medication Management: Evaluate patient's response, side effects, and tolerance of medication regimen.  Therapeutic Interventions: 1 to 1 sessions, Unit Group sessions and Medication administration.  Evaluation of Outcomes: Progressing   RN Treatment Plan for Primary Diagnosis: MDD (major depressive disorder) Long Term Goal(s): Knowledge of disease and therapeutic regimen to maintain health will improve  Short Term Goals: Ability to remain free from injury will improve, Ability to verbalize frustration and anger appropriately will improve, Ability to demonstrate self-control, Ability to participate in decision making will improve, Ability to verbalize feelings will improve, Ability to disclose and discuss suicidal ideas, Ability to identify and develop effective coping behaviors will improve, and Compliance with prescribed medications will improve  Medication Management: RN will administer medications as ordered by provider, will assess and evaluate patient's response and provide education to patient for prescribed medication. RN will report any adverse and/or side effects to prescribing  provider.  Therapeutic Interventions: 1 on 1 counseling sessions, Psychoeducation, Medication administration, Evaluate responses to treatment, Monitor vital signs and CBGs as ordered, Perform/monitor CIWA, COWS, AIMS and Fall Risk screenings as ordered, Perform wound care treatments as ordered.  Evaluation of Outcomes: Progressing   LCSW Treatment Plan for Primary Diagnosis: MDD (major depressive disorder) Long Term Goal(s): Safe transition to appropriate next level of care at discharge, Engage patient in therapeutic group addressing interpersonal concerns.  Short Term Goals: Engage patient in aftercare planning with referrals and resources, Increase social support, Increase ability to appropriately verbalize feelings, Increase emotional regulation, Facilitate acceptance of mental health diagnosis and concerns, Facilitate patient progression through stages of change regarding substance use diagnoses and concerns, Identify triggers associated with mental health/substance abuse issues, and Increase skills for wellness and recovery  Therapeutic Interventions: Assess for all discharge needs, 1 to 1 time with Social worker, Explore available resources and support systems, Assess for adequacy in community support network, Educate family and significant other(s) on suicide prevention, Complete Psychosocial Assessment, Interpersonal group therapy.  Evaluation of Outcomes: Progressing   Progress in Treatment: Attending groups: Yes. Participating in groups: Yes. Taking medication as prescribed: Yes. Toleration medication: Yes. Family/Significant other contact  made: No, will contact:  Declined consents Patient understands diagnosis: Yes. Discussing patient identified problems/goals with staff: Yes. Medical problems stabilized or resolved: Yes. Denies suicidal/homicidal ideation: Yes. Issues/concerns per patient self-inventory: Yes. Other: N/A  New problem(s) identified: No, Describe:  None  reported  New Short Term/Long Term Goal(s):stabilization, elimination of SI thoughts, development of comprehensive mental wellness plan.   Patient Goals:  "I can't think of any right now"  Discharge Plan or Barriers: CSW will continue to follow and assess for appropriate referrals and possible discharge planning.   Reason for Continuation of Hospitalization: Anxiety Depression Medication stabilization Suicidal ideation Withdrawal symptoms  Estimated Length of Stay: 5-7 Days  Last 3 [place] Suicide Severity Risk Score: Flowsheet Row ED to Hosp-Admission (Current) [facility] from 05/24/2023 in [facility] at [facility] ED from 05/23/2023 in [facility] Emergency Department at [facility]  C-SSRS RISK CATEGORY No Risk No Risk No Risk       Last PHQ 2/9 Scores:     No data to display         detox, medication management for mood stabilization; elimination of SI thoughts; development of comprehensive mental wellness/sobriety plan    Scribe for Treatment Team: Ane Payment, LCSW 05/26/2023 12:30 PM

## 2023-05-26 NOTE — Progress Notes (Signed)
   05/26/23 2135  Psych Admission Type (Psych Patients Only)  Admission Status Involuntary  Psychosocial Assessment  Patient Complaints Depression  Eye Contact Avoids  Facial Expression Flat  Affect Appropriate to circumstance  Speech Logical/coherent  Interaction Assertive  Motor Activity Other (Comment)  Appearance/Hygiene Improved  Behavior Characteristics Guarded  Mood Depressed  Thought Process  Coherency WDL  Content WDL  Delusions None reported or observed  Perception WDL  Hallucination None reported or observed  Judgment Poor  Confusion None  Danger to Self  Current suicidal ideation? Denies  Danger to Others  Danger to Others None reported or observed  Danger to Others Abnormal  Harmful Behavior to others No threats or harm toward other people  Destructive Behavior No threats or harm toward property

## 2023-05-26 NOTE — ED Notes (Signed)
Pt completed the PO challenge. Pt was able to drink water with no issues. RN and MD were notified

## 2023-05-26 NOTE — Progress Notes (Signed)
Pt in bed with eyes closed throughout discussion and assessment with nurse. Pt remains with 1:1 for safety. Pt denies SI/HI/AVH. Pt states he did not sleep well last night due to ED visit. Pt reports feeling dizzy this morning and states he just wants to sleep. Fluids encouraged.

## 2023-05-26 NOTE — Progress Notes (Signed)
During 15 minute check tech informed nurse that pt was witnessed having seizure like activity while laying in bed. Seizing was intermittent lasting several minutes at a time. Provider notified. 4mg  of Ativan administered. Pt transferred to [facility] ED.

## 2023-05-26 NOTE — H&P (Signed)
Psychiatric Admission Assessment Adult  Patient Identification: Vincent Frank MRN:  [id number] Date of Evaluation:  05/26/2023  Chief Complaint:  Seizure-like activity (HCC) [R56.9] MDD (major depressive disorder) [F32.9],  MDD (major depressive disorder)  Principal Problem:   MDD (major depressive disorder)   History of Present Illness:  Vincent Frank is a 22 y.o., male with a past psychiatric history significant for MDD and seizure-like activity who presents to the [facility] Involuntary from [facility] Emergency Department for evaluation and management of suicide attempt of overdosing on Benadryl.   Initial assessment on 10/30, patient was evaluated on the inpatient unit, the patient reports he had an accidental overdose. He was walking in the woods two nights ago and he was intoxicated on alcohol and he was on the phone with his brother and he got stung and he asked his brother how much Bendryl he should take. His brother told him to take "25" and patient reports taking that exact amount. Afterwards, he kept walking in the woods and he started feeling the effects of the medications and he got stuck in the mud. He then called 911 due to being stuck in the mud. He denies trying to take his life when taking the Benadryl. The sting was on his right forearm. His motivating factors are his job, his family, and general life. He works as a IT sales professional for 1.5 years.   Today, patient feels tired and "wiped out". His mood is disappointed because he states "I am disappointed for not using my brain".   Denies SI, HI, and AVH.  Psychiatric ROS Mood Symptoms Denies persistent sadness or low mood; reports loss of interest or pleasure in activities (anhedonia)  Anxiety Symptoms Refused to answer further questions   Chart review: Per consult psych note on 10/29 by Eligha Bridegroom,  I spoke with patient's mother, Vincent Frank, who states the patient was seemingly  "doing better" but over the past few days has been having pseudoseizures, and "disappearing." She tells me they have seeing a neurologist, he's received multiple EEGs and CAT scans, all of which are negative. Pt has been diagnosed with pseudoseizures, as they can not find any medical cause or true seizure findings.    She states yesterday bistanders found him under a bridge, and then later in the day was found in the woods covered in mud. She is unsure what is going on, he "acts like nothing is wrong and nothing happened." He did not stay at the house the night before and was missing all night. She is not sure why his behavior is this way, she states he has giant medical bills due to the lies he will tell doctors such as "falls and car wrecks" that didn't happen just to get medical tests done. She states "he is attention seeking but he also does try to kill himself. He lies so much I don't know what to believe. I don't think he's acting like his normal self right now and I don't think it would be safe for him to discharge home at this time." She also mentions he refuses to see a therapist, and randomly cancelled his psychiatry OP appointment the other day. Mother is up to date the patient will be transferring to [facility] today, she is happy with this plan.   Psychiatric medications prior to admission: abilify (good for years), prozac (good for years), keppra (3-4 weeks), trazodone    Past Psychiatric History:  Previous Psych Diagnoses: MDD Prior psychiatric treatment: did not  answer Psychiatric medication compliance history: compliant  Current psychiatric treatment: abilify, prozac, trazodone Current psychiatrist: most recently 2 weeks ago Current therapist: most recently weeks ago   Previous hospitalizations twice- unsure when History of suicide attempts: per chart review once 2 years ago attempting to jump off roof (currently denies) History of self harm: most recently 2.5 months ago (reports over a  year ago-cutting to release stress)  Substance Use History: Alcohol: drank for weeks and stopped 8 months ago (this contrasts to stating that prior to hospitalization when he was walking in the woods intoxicated as he reported) Hx withdrawal tremors/shakes: denies Hx alcohol related blackouts denies Hx alcohol induced hallucinations: denies Hx alcoholic seizures: denies DUI: denies  --------  Tobacco: denies Marijuana: denies Cocaine: denies Methamphetamines: denies Ecstasy: denies Opiates: denies Benzodiazepines: denies Prescribed Meds abuse: yes, Benadryl  History of Detox / Rehab: denies  Is the patient at risk to self? Yes Has the patient been a risk to self in the past 6 months? Yes Has the patient been a risk to self within the distant past? Yes Is the patient a risk to others? No Has the patient been a risk to others in the past 6 months? No Has the patient been a risk to others within the distant past? No  Alcohol Screening: 1. How often do you have a drink containing alcohol?: Never 2. How many drinks containing alcohol do you have on a typical day when you are drinking?: 1 or 2 3. How often do you have six or more drinks on one occasion?: Never AUDIT-C Score: 0 4. How often during the last year have you found that you were not able to stop drinking once you had started?: Never 5. How often during the last year have you failed to do what was normally expected from you because of drinking?: Never 6. How often during the last year have you needed a first drink in the morning to get yourself going after a heavy drinking session?: Never 7. How often during the last year have you had a feeling of guilt of remorse after drinking?: Never 8. How often during the last year have you been unable to remember what happened the night before because you had been drinking?: Never 9. Have you or someone else been injured as a result of your drinking?: No 10. Has a relative or friend  or a doctor or another health worker been concerned about your drinking or suggested you cut down?: No Alcohol Use Disorder Identification Test Final Score (AUDIT): 0 Alcohol Brief Interventions/Follow-up: Alcohol education/Brief advice Tobacco Screening:    Substance Abuse History in the last 12 months: Yes  Past Medical/Surgical History:  Medical Diagnoses: seizures Prior Hosp: multiple times for seizures  Head trauma: yes, recently fell off ladder but he did not want to go in detail of the event    Allergies: haldol-dystonic reaction  Family History:  Psych: brother bipolar Suicide: brother attempted Substance use family hx: denies current  Social History:   Marital Status: single Children: denies 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 Housing: living with family bc he states not having the money to live alone Legal: denies Weapons: denies  Lab Results:  Results for orders placed or performed during the hospital encounter of 05/25/23 (from the past 48 hour(s))  CBC with Differential     Status: Abnormal   Collection Time: 05/25/23 10:40 PM  Result Value Ref Range   WBC 7.1 4.0 - 10.5 K/uL   RBC 3.68 (  L) 4.22 - 5.81 MIL/uL   Hemoglobin 9.4 (L) 13.0 - 17.0 g/dL   HCT 16.1 (L) 09.6 - 04.5 %   MCV 81.0 80.0 - 100.0 fL   MCH 25.5 (L) 26.0 - 34.0 pg   MCHC 31.5 30.0 - 36.0 g/dL   RDW 40.9 81.1 - 91.4 %   Platelets 187 150 - 400 K/uL   nRBC 0.0 0.0 - 0.2 %   Neutrophils Relative % 71 %   Neutro Abs 5.0 1.7 - 7.7 K/uL   Lymphocytes Relative 16 %   Lymphs Abs 1.2 0.7 - 4.0 K/uL   Monocytes Relative 10 %   Monocytes Absolute 0.7 0.1 - 1.0 K/uL   Eosinophils Relative 3 %   Eosinophils Absolute 0.2 0.0 - 0.5 K/uL   Basophils Relative 0 %   Basophils Absolute 0.0 0.0 - 0.1 K/uL   Immature Granulocytes 0 %   Abs Immature Granulocytes 0.03 0.00 - 0.07 K/uL    Comment: Performed at [facility], [address] [place], [place]  [address]  Basic metabolic panel     Status: Abnormal   Collection Time: 05/25/23 10:40 PM  Result Value Ref Range   Sodium 137 135 - 145 mmol/L   Potassium 3.7 3.5 - 5.1 mmol/L   Chloride 109 98 - 111 mmol/L   CO2 21 (L) 22 - 32 mmol/L   Glucose, Bld 86 70 - 99 mg/dL    Comment: Glucose reference range applies only to samples taken after fasting for at least 8 hours.   BUN 11 6 - 20 mg/dL   Creatinine, Ser 6.21 (H) 0.61 - 1.24 mg/dL   Calcium 8.8 (L) 8.9 - 10.3 mg/dL   GFR, Estimated >30 >86 mL/min    Comment: (NOTE) Calculated using the CKD-EPI Creatinine Equation (2021)    Anion gap 7 5 - 15    Comment: Performed at [facility], [address] [place], [place] [address]    Blood Alcohol level:  Lab Results  Component Value Date   [facility] <10 05/24/2023   ETH <10 05/23/2023    Metabolic Disorder Labs:  Lab Results  Component Value Date   HGBA1C 5.2 03/03/2023   MPG 102.54 03/03/2023   MPG 96.8 07/07/2021   No results found for: "PROLACTIN" Lab Results  Component Value Date   CHOL 217 (H) 03/03/2023   TRIG 82 03/03/2023   HDL 39 (L) 03/03/2023   CHOLHDL 5.6 03/03/2023   VLDL 16 03/03/2023   LDLCALC 162 (H) 03/03/2023   LDLCALC 138 (H) 07/07/2021    Current Medications: Current Facility-Administered Medications  Medication Dose Route Frequency Provider Last Rate Last Admin   acetaminophen (TYLENOL) tablet 650 mg  650 mg Oral Q6H PRN Eligha Bridegroom, NP       alum & mag hydroxide-simeth (MAALOX/MYLANTA) 200-200-20 MG/5ML suspension 30 mL  30 mL Oral Q4H PRN Eligha Bridegroom, NP       ammonia inhalant            ARIPiprazole (ABILIFY) tablet 2 mg  2 mg Oral QHS Eligha Bridegroom, NP       diphenhydrAMINE (BENADRYL) capsule 50 mg  50 mg Oral TID PRN Eligha Bridegroom, NP       Or   diphenhydrAMINE (BENADRYL) injection 50 mg  50 mg Intramuscular TID PRN Eligha Bridegroom, NP       FLUoxetine (PROZAC) capsule 40 mg  40 mg Oral QHS Eligha Bridegroom, NP  hydrOXYzine (ATARAX) tablet 25 mg  25 mg Oral TID PRN Eligha Bridegroom, NP       levETIRAcetam (KEPPRA) tablet 500 mg  500 mg Oral BID Eligha Bridegroom, NP   500 mg at 05/26/23 0803   LORazepam (ATIVAN) tablet 2 mg  2 mg Oral TID PRN Eligha Bridegroom, NP       Or   LORazepam (ATIVAN) injection 2 mg  2 mg Intramuscular TID PRN Eligha Bridegroom, NP   2 mg at 05/25/23 2040   LORazepam (ATIVAN) tablet 2 mg  2 mg Per Tube Once Massengill, [name], MD       magnesium hydroxide (MILK OF MAGNESIA) suspension 30 mL  30 mL Oral Daily PRN Eligha Bridegroom, NP       traZODone (DESYREL) tablet 50 mg  50 mg Oral Daily PRN Eligha Bridegroom, NP        PTA Medications: Medications Prior to Admission  Medication Sig Dispense Refill Last Dose   acetaminophen (TYLENOL) 325 MG tablet Take 2 tablets (650 mg total) by mouth every 6 (six) hours as needed for mild pain (or Fever >/= 101). (Patient not taking: Reported on 05/25/2023)      ARIPiprazole (ABILIFY) 2 MG tablet Take 1 tablet (2 mg total) by mouth at bedtime. Antidepressant augmentation 30 tablet 0    fenofibrate (TRICOR) 145 MG tablet Take 145 mg by mouth daily.      ferrous sulfate 325 (65 FE) MG tablet Take 1 tablet (325 mg total) by mouth daily with breakfast. (Patient not taking: Reported on 05/25/2023)      FLUoxetine (PROZAC) 40 MG capsule Take 1 capsule (40 mg total) by mouth at bedtime. For depression 30 capsule 0    hydrOXYzine (ATARAX) 25 MG tablet Take 1 tablet (25 mg total) by mouth every 6 (six) hours as needed for anxiety. (Patient not taking: Reported on 05/25/2023) 75 tablet 0    levETIRAcetam (KEPPRA) 500 MG tablet Take 500 mg by mouth 2 (two) times daily.      polyethylene glycol (MIRALAX / GLYCOLAX) 17 g packet Take 17 g by mouth daily as needed for mild constipation. (Patient not taking: Reported on 05/25/2023) 14 each 0    traZODone (DESYREL) 50 MG tablet Take 1 tablet by mouth daily as needed for sleep.        Mental Status  Exam  General Appearance: appears at stated age, in scrubs  Behavior: noncooperative  Psychomotor Activity: no psychomotor agitation or retardation noted   Eye Contact: fair  Speech: normal amount, tone, volume and fluency    Mood: depressed Affect: congruent  Thought Process: linear, goal directed, no circumstantial or tangential thought process noted, no racing thoughts or flight of ideas  Descriptions of Associations: intact   Thought Content Hallucinations: denies AH, VH , does not appear responding to stimuli  Delusions: no paranoia, delusions of control, grandeur, ideas of reference, thought broadcasting, and magical thinking  Suicidal Thoughts: denies SI, intention, plan  Homicidal Thoughts: denies HI, intention, plan   Alertness/Orientation: alert and fully oriented   Insight: limited Judgment: limited  Memory: limited   Executive Functions  Concentration: intact  Attention Span: fair  Recall: intact  Fund of Knowledge: fair   Physical Exam  Constitutional:      Appearance: Normal appearance.  Cardiovascular:     Rate and Rhythm: Normal rate.  Pulmonary:     Effort: Pulmonary effort is normal.  Neurological:     General: No focal deficit present.  Mental Status: Alert and oriented to person, place, and time.    Review of Systems  No reported symptoms  Blood pressure 117/87, pulse 86, temperature 98 F (36.7 C), resp. rate 16, height 6\' 3"  (1.905 m), weight 107.5 kg, SpO2 99%. Body mass index is 29.62 kg/m.   Treatment Plan Summary: Daily contact with patient to assess and evaluate symptoms and progress in treatment and medication management  ASSESSMENT: Vincent Frank is a 22 y.o., male with a past psychiatric history significant for MDD and seizure-like activity who presents to the [facility] Involuntary from [facility] Emergency Department for evaluation and management of suicide attempt of overdosing on Benadryl.    PLAN: Safety and Monitoring:  -- Involuntary admission to inpatient psychiatric unit for safety, stabilization and treatment  -- Daily contact with patient to assess and evaluate symptoms and progress in treatment  -- Patient's case to be discussed in multi-disciplinary team meeting  -- Observation Level : q15 minute checks  -- Vital signs:  q12 hours  -- Precautions: suicide, elopement, and assault  2. Medications:    Psychiatric Diagnosis and Treatment MDD, recurrent, severe -Increase Abilify to 5 mg nightly (home dose was 2 mg) -Increase Prozac to 60 mg daily (home dose was 40 mg) -Trazodone 50 mg at bedtime as needed for insomnia -Atarax 25 mg TID as needed for anxiety Agitation Protocol: Ativan/Benadryl/Geodon  Medical Diagnosis and Treatment Reported seizures -continue Keppra 500 BID  Patient does not need nicotine replacement  Other as needed medications  Tylenol 650 mg every 6 hours as needed for pain Mylanta 30 mL every 4 hours as needed for indigestion Milk of magnesia 30 mL daily as needed for constipation    The risks/benefits/side-effects/alternatives to the above medication were discussed in detail with the patient and time was given for questions. The patient consents to medication trial. FDA black box warnings, if present, were discussed.  The patient is agreeable with the medication plan, as above. We will monitor the patient's response to pharmacologic treatment, and adjust medications as necessary.  3. Routine and other pertinent labs: EKG monitoring: QTc: 457 on 10/29  Metabolism / endocrine: BMI: Body mass index is 29.62 kg/m.  CBC: hgb 9.4 CMP: Cr 1.35 UDS: positive benzo Ethanol: <10 UA: small leuks  4. Group Therapy:  -- Encouraged patient to participate in unit milieu and in scheduled group therapies   -- Short Term Goals: Ability to identify changes in lifestyle to reduce recurrence of condition will improve, Ability to verbalize  feelings will improve, Ability to disclose and discuss suicidal ideas, Ability to demonstrate self-control will improve, Ability to identify and develop effective coping behaviors will improve, Ability to maintain clinical measurements within normal limits will improve, Compliance with prescribed medications will improve, and Ability to identify triggers associated with substance abuse/mental health issues will improve  -- Long Term Goals: Improvement in symptoms so as ready for discharge -- Patient is encouraged to participate in group therapy while admitted to the psychiatric unit. -- We will address other chronic and acute stressors, which contributed to the patient's MDD (major depressive disorder) in order to reduce the risk of self-harm at discharge.  5. Discharge Planning:   -- Social work and case management to assist with discharge planning and identification of hospital follow-up needs prior to discharge  -- Estimated LOS: 5-7 days  -- Discharge Concerns: Need to establish a safety plan; Medication compliance and effectiveness  -- Discharge Goals: Return home with outpatient referrals  for mental health follow-up including medication management/psychotherapy  I certify that inpatient services furnished can reasonably be expected to improve the patient's condition.    I discussed my assessment, planned testing and intervention for the patient with Dr. Jimmey Ralph who agrees with my formulated course of action.   Lance Muss, MD, PGY-2 10/30/202410:00 AM

## 2023-05-26 NOTE — Progress Notes (Signed)
Pt remains awake and alert. Sitting up in bed and eating.   Sitter at bedside.   Pt continues to have tremors and has been assisted with ambulation to prevent falls.   Safety maintained at this time.

## 2023-05-26 NOTE — BHH Suicide Risk Assessment (Signed)
Suicide Risk Assessment  Admission Assessment    [facility] Admission Suicide Risk Assessment   Nursing information obtained from:    Demographic factors:  Male, Adolescent or young adult, Caucasian Current Mental Status:  NA Loss Factors:  NA Historical Factors:  NA Risk Reduction Factors:  NA  Total Time spent with patient: 45 minutes Principal Problem: MDD (major depressive disorder) Diagnosis:  Principal Problem:   MDD (major depressive disorder)   Subjective Data:   Vincent Frank is a 22 y.o., male with a past psychiatric history significant for MDD and seizure-like activity who presents to the [facility] Involuntary from [facility] Emergency Department for evaluation and management of suicide attempt of overdosing on Benadryl.    Initial assessment on 10/30, patient was evaluated on the inpatient unit, the patient reports he had an accidental overdose. He was walking in the woods two nights ago and he was intoxicated on alcohol and he was on the phone with his brother and he got stung and he asked his brother how much Bendryl he should take. His brother told him to take "25" and patient reports taking that exact amount. Afterwards, he kept walking in the woods and he started feeling the effects of the medications and he got stuck in the mud. He then called 911 due to being stuck in the mud. He denies trying to take his life when taking the Benadryl. The sting was on his right forearm. His motivating factors are his job, his family, and general life. He works as a IT sales professional for 1.5 years.    Today, patient feels tired and "wiped out". His mood is disappointed because he states "I am disappointed for not using my brain".    Denies SI, HI, and AVH.  Continued Clinical Symptoms:  Alcohol Use Disorder Identification Test Final Score (AUDIT): 0 The "Alcohol Use Disorders Identification Test", Guidelines for Use in Primary Care, Second Edition.  World Environmental consultant [facility]). Score between 0-7:  no or low risk or alcohol related problems. Score between 8-15:  moderate risk of alcohol related problems. Score between 16-19:  high risk of alcohol related problems. Score 20 or above:  warrants further diagnostic evaluation for alcohol dependence and treatment.   CLINICAL FACTORS:   Depression:   Anhedonia   Musculoskeletal: Strength & Muscle Tone: within normal limits Gait & Station: normal Patient leans: N/A  Psychiatric Specialty Exam:  General Appearance: appears at stated age, in scrubs   Behavior: noncooperative   Psychomotor Activity: no psychomotor agitation or retardation noted    Eye Contact: fair  Speech: normal amount, tone, volume and fluency      Mood: depressed Affect: congruent   Thought Process: linear, goal directed, no circumstantial or tangential thought process noted, no racing thoughts or flight of ideas  Descriptions of Associations: intact    Thought Content Hallucinations: denies AH, VH , does not appear responding to stimuli  Delusions: no paranoia, delusions of control, grandeur, ideas of reference, thought broadcasting, and magical thinking  Suicidal Thoughts: denies SI, intention, plan  Homicidal Thoughts: denies HI, intention, plan    Alertness/Orientation: alert and fully oriented    Insight: limited Judgment: limited   Memory: limited    Executive Functions  Concentration: intact  Attention Span: fair  Recall: intact  Fund of Knowledge: fair    Physical Exam  Constitutional:      Appearance: Normal appearance.  Cardiovascular:     Rate and Rhythm: Normal rate.  Pulmonary:  Effort: Pulmonary effort is normal.  Neurological:     General: No focal deficit present.     Mental Status: Alert and oriented to person, place, and time.      Review of Systems  No reported symptoms   COGNITIVE FEATURES THAT CONTRIBUTE TO RISK:  Closed-mindedness    SUICIDE RISK:  Moderate:   Frequent suicidal ideation with limited intensity, and duration, some specificity in terms of plans, no associated intent, good self-control, limited dysphoria/symptomatology, some risk factors present, and identifiable protective factors, including available and accessible social support.  PLAN OF CARE: See H&P for assessment and plan.   I certify that inpatient services furnished can reasonably be expected to improve the patient's condition.   Lance Muss, MD 05/26/2023, 1:29 PM

## 2023-05-26 NOTE — BHH Group Notes (Signed)
Spiritual care group facilitated by Chaplain Katy Denessa Cavan, B[facility]  Group focused on topic of strength. Group members reflected on what thoughts and feelings emerge when they hear this topic. They then engaged in facilitated dialog around how strength is present in their lives. This dialog focused on representing what strength had been to them in their lives (images and patterns given) and what they saw as helpful in their life now (what they needed / wanted).  Activity drew on narrative framework.  Patient Progress: Did not attend.  

## 2023-05-26 NOTE — Plan of Care (Signed)

## 2023-05-27 NOTE — Progress Notes (Signed)
1:1 Note: Patient resting in bed at this time, no complaints made. Pt able to tolerate lunch well. 1:1 observation continues for safety.

## 2023-05-27 NOTE — Progress Notes (Signed)
1:1 Note: Patient sitting on bed ,no distress noted at this time. 1:1 MHT at bedside. Safety maintained.

## 2023-05-27 NOTE — Progress Notes (Signed)
   05/27/23 0557  15 Minute Checks  Location Bedroom  Visual Appearance Calm  Behavior Sleeping  Sleep (Behavioral Health Patients Only)  Calculate sleep? (Click Yes once per 24 hr at 0600 safety check) Yes  Documented sleep last 24 hours 9.5

## 2023-05-27 NOTE — Plan of Care (Signed)
  Problem: Education: Goal: Emotional status will improve Outcome: Progressing   Problem: Education: Goal: Mental status will improve Outcome: Progressing   Problem: Activity: Goal: Sleeping patterns will improve Outcome: Progressing   Problem: Physical Regulation: Goal: Ability to maintain clinical measurements within normal limits will improve Outcome: Progressing   Problem: Safety: Goal: Periods of time without injury will increase Outcome: Progressing

## 2023-05-27 NOTE — Group Note (Signed)
LCSW Group Therapy Note   Group Date: 05/27/2023 Start Time: 1100 End Time: 1200   Type of Therapy and Topic:  Group Therapy: Boundaries  Participation Level:  Did Not Attend  Description of Group: This group will address the use of boundaries in their personal lives. Patients will explore why boundaries are important, the difference between healthy and unhealthy boundaries, and negative and postive outcomes of different boundaries and will look at how boundaries can be crossed.  Patients will be encouraged to identify current boundaries in their own lives and identify what kind of boundary is being set. Facilitators will guide patients in utilizing problem-solving interventions to address and correct types boundaries being used and to address when no boundary is being used. Understanding and applying boundaries will be explored and addressed for obtaining and maintaining a balanced life. Patients will be encouraged to explore ways to assertively make their boundaries and needs known to significant others in their lives, using other group members and facilitator for role play, support, and feedback.  Therapeutic Goals:  1.  Patient will identify areas in their life where setting clear boundaries could be  used to improve their life.  2.  Patient will identify signs/triggers that a boundary is not being respected. 3.  Patient will identify two ways to set boundaries in order to achieve balance in  their lives: 4.  Patient will demonstrate ability to communicate their needs and set boundaries  through discussion and/or role plays  Summary of Patient Progress:  Pt did not attend group  Therapeutic Modalities:   Cognitive Behavioral Therapy Solution-Focused Therapy  Kathi Der, LCSWA 05/27/2023  12:27 PM

## 2023-05-27 NOTE — Progress Notes (Signed)
Patient resting in his room, no acute distress or complaints made. 1:1 observation continues for safety. Safety maintained.

## 2023-05-27 NOTE — Progress Notes (Signed)
Patient reports epigastric pain, patient rated his pain 8/10. Patient declined Mylanta. Tylenol 650 mg administered. Will continue to monitor.
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
## 2023-05-27 NOTE — Progress Notes (Signed)
Patient resting in his room, no acute distress noted. 1:1 observation continues for safety. Safety maintained.

## 2023-05-27 NOTE — Progress Notes (Signed)
1:1 Note: Patient sitting up in bed at this time, no complaints made or distress noted. Pt able to tolerate dinner well. 1:1 observation continues. Safety maintained.

## 2023-05-27 NOTE — Progress Notes (Signed)
1:1 Note: Patient resting in bed, patient verbalized decreased pain, no distress noted. Sitter at bedside, safety maintained.

## 2023-05-27 NOTE — Progress Notes (Signed)
   05/27/23 0800  Psych Admission Type (Psych Patients Only)  Admission Status Involuntary  Psychosocial Assessment  Patient Complaints Depression  Eye Contact Avoids  Facial Expression Flat  Affect Depressed;Flat  Speech Logical/coherent  Interaction Cautious  Motor Activity Slow  Appearance/Hygiene Improved  Behavior Characteristics Guarded  Mood Depressed  Thought Process  Coherency WDL  Content WDL  Delusions None reported or observed  Perception WDL  Hallucination None reported or observed  Judgment Poor  Confusion None  Danger to Self  Current suicidal ideation? Denies  Agreement Not to Harm Self Yes  Description of Agreement Verbal  Danger to Others  Danger to Others None reported or observed  Danger to Others Abnormal  Harmful Behavior to others No threats or harm toward other people  Destructive Behavior No threats or harm toward property

## 2023-05-27 NOTE — Progress Notes (Signed)
1:1 Note: Patient resting in bed at this time. Sitter at bedside, ongoing observation and assistance with care. Respirations are even and unlabored. Patient kept safe, will continue to monitor.

## 2023-05-27 NOTE — Progress Notes (Signed)
[facility] MD Progress Note  05/27/2023 9:02 AM Vincent Frank  MRN:  [id number]   Reason for Admission:  Vincent Frank is a 22 y.o. male with a history of MDD, who was initially admitted for inpatient psychiatric hospitalization on 05/25/2023 for management of concern with SI attempt with taking 25 pills of Benadryl. The patient is currently on Hospital Day 2.   Chart Review from last 24 hours:  The patient's chart was reviewed and nursing notes were reviewed. Vital signs: 112/61. The patient's case was discussed in multidisciplinary team meeting. Per [facility], patient was taking medications appropriately. Patient received the following PRN medications: tylenol, atarax, and trazodone. Per nursing, patient is calm and cooperative and attended no groups.   Information Obtained Today During Patient Interview: The patient was seen in his room, no acute distress. On assessment, the patient feels "sad and angry" today. When asked about speaking with family or friends, he denies. Patient reports having good sleep and good appetite. Patient feels that the medications have been fine and denies adverse effects. He agrees to take the increased dose of Abilify and Prozac and monitor for side effects.    Denies SI, HI, AVH.    Principal Problem: MDD (major depressive disorder) Diagnosis: Principal Problem:   MDD (major depressive disorder)    Past Psychiatric History:  Previous Psych Diagnoses: MDD Prior psychiatric treatment: did not answer Psychiatric medication compliance history: compliant   Current psychiatric treatment: abilify, prozac, trazodone Current psychiatrist: most recently 2 weeks ago Current therapist: most recently weeks ago    Previous hospitalizations twice- unsure when History of suicide attempts: per chart review once 2 years ago attempting to jump off roof (currently denies) History of self harm: most recently 2.5 months ago (reports over a year ago-cutting to release  stress)   Substance Use History: Alcohol: drank for weeks and stopped 8 months ago (this contrasts to stating that prior to hospitalization when he was walking in the woods intoxicated as he reported) Hx withdrawal tremors/shakes: denies Hx alcohol related blackouts denies Hx alcohol induced hallucinations: denies Hx alcoholic seizures: denies DUI: denies   --------   Tobacco: denies Marijuana: denies Cocaine: denies Methamphetamines: denies Ecstasy: denies Opiates: denies Benzodiazepines: denies Prescribed Meds abuse: yes, Benadryl  Past Medical History:  Past Medical History:  Diagnosis Date   Major depressive disorder    Seizures (HCC)     Past Surgical History:  Procedure Laterality Date   NO PAST SURGERIES     Family History:  Family History  Problem Relation Age of Onset   Healthy Mother    Family Psychiatric  History: Psych: brother bipolar Suicide: brother attempted Substance use family hx: denies current   Social History:    Marital Status: single Children: denies 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 Housing: living with family bc he states not having the money to live alone Legal: denies Weapons: denies     Current Medications: Current Facility-Administered Medications  Medication Dose Route Frequency Provider Last Rate Last Admin   acetaminophen (TYLENOL) tablet 650 mg  650 mg Oral Q6H PRN Eligha Bridegroom, NP   650 mg at 05/26/23 2135   alum & mag hydroxide-simeth (MAALOX/MYLANTA) 200-200-20 MG/5ML suspension 30 mL  30 mL Oral Q4H PRN Eligha Bridegroom, NP       ARIPiprazole (ABILIFY) tablet 5 mg  5 mg Oral QHS Kizzie Ide B, MD   5 mg at 05/26/23 2134   diphenhydrAMINE (BENADRYL) capsule 50 mg  50 mg Oral TID PRN  Eligha Bridegroom, NP       Or   diphenhydrAMINE (BENADRYL) injection 50 mg  50 mg Intramuscular TID PRN Eligha Bridegroom, NP       FLUoxetine (PROZAC) capsule 60 mg  60 mg Oral Daily Kizzie Ide B, MD   60 mg  at 05/27/23 0809   hydrOXYzine (ATARAX) tablet 25 mg  25 mg Oral TID PRN Eligha Bridegroom, NP   25 mg at 05/26/23 2319   levETIRAcetam (KEPPRA) tablet 500 mg  500 mg Oral BID Eligha Bridegroom, NP   500 mg at 05/27/23 0810   LORazepam (ATIVAN) tablet 2 mg  2 mg Oral TID PRN Eligha Bridegroom, NP       Or   LORazepam (ATIVAN) injection 2 mg  2 mg Intramuscular TID PRN Eligha Bridegroom, NP   2 mg at 05/25/23 2040   LORazepam (ATIVAN) tablet 2 mg  2 mg Per Tube Once Phineas Inches, MD       magnesium hydroxide (MILK OF MAGNESIA) suspension 30 mL  30 mL Oral Daily PRN Eligha Bridegroom, NP       traZODone (DESYREL) tablet 50 mg  50 mg Oral Daily PRN Eligha Bridegroom, NP   50 mg at 05/26/23 2319   ziprasidone (GEODON) injection 20 mg  20 mg Intramuscular TID PRN Lance Muss, MD       Or   ziprasidone (GEODON) capsule 20 mg  20 mg Oral TID PRN Lance Muss, MD        Lab Results:  Results for orders placed or performed during the hospital encounter of 05/25/23 (from the past 48 hour(s))  CBC with Differential     Status: Abnormal   Collection Time: 05/25/23 10:40 PM  Result Value Ref Range   WBC 7.1 4.0 - 10.5 K/uL   RBC 3.68 (L) 4.22 - 5.81 MIL/uL   Hemoglobin 9.4 (L) 13.0 - 17.0 g/dL   HCT 45.4 (L) 09.8 - 11.9 %   MCV 81.0 80.0 - 100.0 fL   MCH 25.5 (L) 26.0 - 34.0 pg   MCHC 31.5 30.0 - 36.0 g/dL   RDW 14.7 82.9 - 56.2 %   Platelets 187 150 - 400 K/uL   nRBC 0.0 0.0 - 0.2 %   Neutrophils Relative % 71 %   Neutro Abs 5.0 1.7 - 7.7 K/uL   Lymphocytes Relative 16 %   Lymphs Abs 1.2 0.7 - 4.0 K/uL   Monocytes Relative 10 %   Monocytes Absolute 0.7 0.1 - 1.0 K/uL   Eosinophils Relative 3 %   Eosinophils Absolute 0.2 0.0 - 0.5 K/uL   Basophils Relative 0 %   Basophils Absolute 0.0 0.0 - 0.1 K/uL   Immature Granulocytes 0 %   Abs Immature Granulocytes 0.03 0.00 - 0.07 K/uL    Comment: Performed at [facility], [address] [place], [place] [address]   Basic metabolic panel     Status: Abnormal   Collection Time: 05/25/23 10:40 PM  Result Value Ref Range   Sodium 137 135 - 145 mmol/L   Potassium 3.7 3.5 - 5.1 mmol/L   Chloride 109 98 - 111 mmol/L   CO2 21 (L) 22 - 32 mmol/L   Glucose, Bld 86 70 - 99 mg/dL    Comment: Glucose reference range applies only to samples taken after fasting for at least 8 hours.   BUN 11 6 - 20 mg/dL   Creatinine, Ser 5.78 (H) 0.61 - 1.24 mg/dL   Calcium 8.8 (  L) 8.9 - 10.3 mg/dL   GFR, Estimated >40 >34 mL/min    Comment: (NOTE) Calculated using the CKD-EPI Creatinine Equation (2021)    Anion gap 7 5 - 15    Comment: Performed at [facility], [address] [place], [place] [address]    Blood Alcohol level:  Lab Results  Component Value Date   ETH <10 05/24/2023   ETH <10 05/23/2023    Metabolic Disorder Labs: Lab Results  Component Value Date   HGBA1C 5.2 03/03/2023   MPG 102.54 03/03/2023   MPG 96.8 07/07/2021   No results found for: "PROLACTIN" Lab Results  Component Value Date   CHOL 217 (H) 03/03/2023   TRIG 82 03/03/2023   HDL 39 (L) 03/03/2023   CHOLHDL 5.6 03/03/2023   VLDL 16 03/03/2023   LDLCALC 162 (H) 03/03/2023   LDLCALC 138 (H) 07/07/2021    Physical Findings: AIMS:  , ,  ,  ,    CIWA:    COWS:     Musculoskeletal: Strength & Muscle Tone: within normal limits Gait & Station: normal Patient leans: N/A  Psychiatric Specialty Exam:  General Appearance: appears at stated age, casually dressed and groomed   Behavior: irritable but better  Psychomotor Activity: no psychomotor agitation or retardation noted   Eye Contact: fair Speech: normal amount, tone, volume and fluency   Mood: depressed Affect: congruent, restricted  Thought Process: linear, goal directed, no circumstantial or tangential thought process noted, no racing thoughts or flight of ideas Descriptions of Associations: intact Thought Content: no bizarre content, logical and  future-oriented Hallucinations: denies AH, VH , does not appear responding to stimuli Delusions: no paranoia, delusions of control, grandeur, ideas of reference, thought broadcasting, and magical thinking Suicidal Thoughts: denies SI, intention, plan  Homicidal Thoughts: denies HI, intention, plan   Alertness/Orientation: alert and fully oriented  Insight: limited Judgment: limited  Memory: intact  Executive Functions  Concentration: intact  Attention Span: fair Recall: intact Fund of Knowledge: fair    Agricultural engineer; Desire for Improvement; Social Support    Physical Exam: Constitutional:      Appearance: Normal appearance.  Cardiovascular:     Rate and Rhythm: Normal rate.  Pulmonary:     Effort: Pulmonary effort is normal.  Neurological:     General: No focal deficit present.     Mental Status: Alert and oriented to person, place, and time.   Review of Systems  No reported symptoms    ASSESSMENT:  Diagnoses / Active Problems: Principal Problem: MDD (major depressive disorder) Diagnosis: Principal Problem:   MDD (major depressive disorder)   RAIDON GOEBEL is a 22 y.o., male with a past psychiatric history significant for MDD and seizure-like activity who presents to the [facility] Involuntary from [facility] Emergency Department for evaluation and management of suicide attempt of overdosing on Benadryl.    PLAN: Safety and Monitoring:             -- Involuntary admission to inpatient psychiatric unit for safety, stabilization and treatment             -- Daily contact with patient to assess and evaluate symptoms and progress in treatment             -- Patient's case to be discussed in multi-disciplinary team meeting             -- Observation Level : q15 minute checks             --  Vital signs:  q12 hours             -- Precautions: suicide, elopement, and assault   2. Medications:               Psychiatric  Diagnosis and Treatment MDD, recurrent, severe -Continue Abilify 5 mg nightly (home dose was 2 mg) -Continue Prozac 60 mg daily (home dose was 40 mg) -Trazodone 50 mg at bedtime as needed for insomnia -Atarax 25 mg TID as needed for anxiety Agitation Protocol: Ativan/Benadryl/Geodon   Medical Diagnosis and Treatment Reported seizures -continue Keppra 500 BID   Patient does not need nicotine replacement   Other as needed medications  Tylenol 650 mg every 6 hours as needed for pain Mylanta 30 mL every 4 hours as needed for indigestion Milk of magnesia 30 mL daily as needed for constipation       The risks/benefits/side-effects/alternatives to the above medication were discussed in detail with the patient and time was given for questions. The patient consents to medication trial. FDA black box warnings, if present, were discussed.   The patient is agreeable with the medication plan, as above. We will monitor the patient's response to pharmacologic treatment, and adjust medications as necessary.   3. Routine and other pertinent labs: EKG monitoring: QTc: 457 on 10/29   Metabolism / endocrine: BMI: Body mass index is 29.62 kg/m.   CBC: hgb 9.4 CMP: Cr 1.35 UDS: positive benzo Ethanol: <10 UA: small leuks   4. Group Therapy:             -- Encouraged patient to participate in unit milieu and in scheduled group therapies              -- Short Term Goals: Ability to identify changes in lifestyle to reduce recurrence of condition will improve, Ability to verbalize feelings will improve, Ability to disclose and discuss suicidal ideas, Ability to demonstrate self-control will improve, Ability to identify and develop effective coping behaviors will improve, Ability to maintain clinical measurements within normal limits will improve, Compliance with prescribed medications will improve, and Ability to identify triggers associated with substance abuse/mental health issues will improve              -- Long Term Goals: Improvement in symptoms so as ready for discharge -- Patient is encouraged to participate in group therapy while admitted to the psychiatric unit. -- We will address other chronic and acute stressors, which contributed to the patient's MDD (major depressive disorder) in order to reduce the risk of self-harm at discharge.   5. Discharge Planning:              -- Social work and case management to assist with discharge planning and identification of hospital follow-up needs prior to discharge             -- Estimated LOS: 5-7 days             -- Discharge Concerns: Need to establish a safety plan; Medication compliance and effectiveness             -- Discharge Goals: Return home with outpatient referrals for mental health follow-up including medication management/psychotherapy   I certify that inpatient services furnished can reasonably be expected to improve the patient's condition.       Total Time Spent in Direct Patient Care:  I personally spent 20 minutes on the unit in direct patient care. The direct patient care time included face-to-face time with the patient,  reviewing the patient's chart, communicating with other professionals, and coordinating care. Greater than 50% of this time was spent in counseling or coordinating care with the patient regarding goals of hospitalization, psycho-education, and discharge planning needs.   Lance Muss, MD PGY-2 Psychiatry 05/27/2023, 9:02 AM

## 2023-05-27 NOTE — Progress Notes (Signed)
1:1 Note: Patient still sleeping at this time. Respirations even and unlabored. No s/s of distress observed. Sitter by bedside, ongoing observation and assistance with care. Safety maintained.

## 2023-05-27 NOTE — Progress Notes (Signed)
1:1 Note: Patient lying in bed resting at this time. No acute distress noted. 1:1 observation in progress for safety. Safety maintained.

## 2023-05-27 NOTE — Plan of Care (Signed)
  Problem: Education: Goal: Knowledge of [facility] Education information/materials will improve Outcome: Progressing   Problem: Education: Goal: Mental status will improve Outcome: Progressing   Problem: Activity: Goal: Sleeping patterns will improve Outcome: Progressing   Problem: Coping: Goal: Ability to verbalize frustrations and anger appropriately will improve Outcome: Progressing   Problem: Health Behavior/Discharge Planning: Goal: Compliance with treatment plan for underlying cause of condition will improve Outcome: Progressing   Problem: Safety: Goal: Periods of time without injury will increase Outcome: Progressing

## 2023-05-28 NOTE — Plan of Care (Signed)
  Problem: Education: Goal: Knowledge of [facility] Education information/materials will improve Outcome: Progressing Goal: Emotional status will improve Outcome: Progressing Goal: Mental status will improve Outcome: Progressing Goal: Verbalization of understanding the information provided will improve Outcome: Progressing   Problem: Activity: Goal: Interest or engagement in activities will improve Outcome: Progressing Goal: Sleeping patterns will improve Outcome: Progressing   Problem: Safety: Goal: Periods of time without injury will increase Outcome: Progressing   Problem: Physical Regulation: Goal: Ability to maintain clinical measurements within normal limits will improve Outcome: Progressing

## 2023-05-28 NOTE — Progress Notes (Signed)
[facility] : [facility] MD Progress Note  05/28/2023 8:13 AM Vincent Frank  MRN:  [id number]  Principal Problem: MDD (major depressive disorder) Diagnosis: Principal Problem:   MDD (major depressive disorder)   Reason for Admission:  Vincent Frank is a 22 y.o. male with a history of MDD, who was initially admitted for inpatient psychiatric hospitalization on 05/25/2023 under IVC for management of concern with SI attempt with taking 25 pills of Benadryl.   (admitted on 05/25/2023, total  LOS: 3 days )   Pertinent information discussed during bed progression:  Slept 3.75 hours, per RN he was restless overnight.   PRNs required overnight: No   Information Obtained Today During Patient Interview:  The patient was evaluated on the unit.  He reports adequate appetite.  He does note his sleep has been "rough", and agreed to receive scheduled trazodone nightly.  He reports his nightly dose of Abilify sedating and wants to continue taking it at that time.  He has participated minimally on the unit, and has mostly kept to himself. He reports his mood as "fine" and answers minimally to all questions. He denies any suicidal ideations or homicidal ideations.  He reports no side effects to currently prescribed medications.  He is preoccupied with being discharged, states "I have so many things to do, I do not want to be here".  He denies any symptoms of psychosis.  He denies any somatic complaints.   Past Psychiatric History:  Previous Psych Diagnoses: MDD Prior psychiatric treatment: did not answer Psychiatric medication compliance history: compliant   Current psychiatric treatment: abilify, prozac, trazodone Current psychiatrist: most recently 2 weeks ago Current therapist: most recently weeks ago    Previous hospitalizations twice- unsure when History of suicide attempts: per chart review once 2 years ago attempting to jump off roof (currently denies) History of self harm: most recently 2.5  months ago (reports over a year ago-cutting to release stress)   Substance Use History: Alcohol: drank for weeks and stopped 8 months ago (this contrasts to stating that prior to hospitalization when he was walking in the woods intoxicated as he reported) Hx withdrawal tremors/shakes: denies Hx alcohol related blackouts denies Hx alcohol induced hallucinations: denies Hx alcoholic seizures: denies DUI: denies   --------   Tobacco: denies Marijuana: denies Cocaine: denies Methamphetamines: denies Ecstasy: denies Opiates: denies Benzodiazepines: denies Prescribed Meds abuse: yes, Benadryl Past Medical History:  Past Medical History:  Diagnosis Date   Major depressive disorder    Seizures (HCC)    Family History:  Family History  Problem Relation Age of Onset   Healthy Mother     Current Medications: Current Facility-Administered Medications  Medication Dose Route Frequency Provider Last Rate Last Admin   acetaminophen (TYLENOL) tablet 650 mg  650 mg Oral Q6H PRN Eligha Bridegroom, NP   650 mg at 05/26/23 2135   alum & mag hydroxide-simeth (MAALOX/MYLANTA) 200-200-20 MG/5ML suspension 30 mL  30 mL Oral Q4H PRN Eligha Bridegroom, NP       ARIPiprazole (ABILIFY) tablet 5 mg  5 mg Oral QHS Kizzie Ide B, MD   5 mg at 05/27/23 2126   diphenhydrAMINE (BENADRYL) capsule 50 mg  50 mg Oral TID PRN Eligha Bridegroom, NP       Or   diphenhydrAMINE (BENADRYL) injection 50 mg  50 mg Intramuscular TID PRN Eligha Bridegroom, NP       FLUoxetine (PROZAC) capsule 60 mg  60 mg Oral Daily Kizzie Ide B, MD   60 mg at 05/27/23  0809   hydrOXYzine (ATARAX) tablet 25 mg  25 mg Oral TID PRN Eligha Bridegroom, NP   25 mg at 05/26/23 2319   levETIRAcetam (KEPPRA) tablet 500 mg  500 mg Oral BID Eligha Bridegroom, NP   500 mg at 05/27/23 1657   LORazepam (ATIVAN) tablet 2 mg  2 mg Oral TID PRN Eligha Bridegroom, NP       Or   LORazepam (ATIVAN) injection 2 mg  2 mg Intramuscular TID PRN Eligha Bridegroom, NP   2 mg at 05/25/23 2040   LORazepam (ATIVAN) tablet 2 mg  2 mg Per Tube Once Massengill, [name], MD       magnesium hydroxide (MILK OF MAGNESIA) suspension 30 mL  30 mL Oral Daily PRN Eligha Bridegroom, NP       traZODone (DESYREL) tablet 50 mg  50 mg Oral Daily PRN Eligha Bridegroom, NP   50 mg at 05/26/23 2319   ziprasidone (GEODON) injection 20 mg  20 mg Intramuscular TID PRN Lance Muss, MD       Or   ziprasidone (GEODON) capsule 20 mg  20 mg Oral TID PRN Lance Muss, MD        Lab Results: No results found for this or any previous visit (from the past 48 hour(s)).  Blood Alcohol level:  Lab Results  Component Value Date   ETH <10 05/24/2023   ETH <10 05/23/2023    Metabolic Labs: Lab Results  Component Value Date   HGBA1C 5.2 03/03/2023   MPG 102.54 03/03/2023   MPG 96.8 07/07/2021   No results found for: "PROLACTIN" Lab Results  Component Value Date   CHOL 217 (H) 03/03/2023   TRIG 82 03/03/2023   HDL 39 (L) 03/03/2023   CHOLHDL 5.6 03/03/2023   VLDL 16 03/03/2023   LDLCALC 162 (H) 03/03/2023   LDLCALC 138 (H) 07/07/2021   Physical Findings: AIMS: No  Psychiatric Specialty Exam:  Presentation  General Appearance: Well Groomed; Appropriate for Environment Eye Contact:Good Speech:Normal Rate Speech Volume:Normal Handedness:Not assessed   Mood and Affect  Mood:"Fine" Affect:Constricted, appeared somewhat irritable   Thought Process  Thought Processes:Coherent  Descriptions of Associations:Intact  Orientation:Grossly intact  Thought Content:Logical  History of Schizophrenia/Schizoaffective disorder:No Duration of Psychotic Symptoms:N/A Hallucinations:Denies Ideas of Reference:Denies  Suicidal Thoughts:Denies Homicidal Thoughts:Denies  Sensorium  Memory:Immediate Fair; Recent Fair; Remote Fair  Judgment: Poor  Insight: Poor   Executive Functions  Concentration:Fair  Attention Span:Fair  Recall:Fair  Fund of  Knowledge:Fair  Language:Fair   Psychomotor Activity  Psychomotor Activity:Normal  Assets  Assets:Communication Skills; Desire for Improvement; Social Support   Sleep  Sleep:Poor   Physical Exam: Physical Exam Vitals and nursing note reviewed.  Constitutional:      General: He is not in acute distress.    Appearance: Normal appearance.  HENT:     Head: Normocephalic and atraumatic.  Pulmonary:     Effort: Pulmonary effort is normal. No respiratory distress.  Musculoskeletal:        General: Normal range of motion.  Neurological:     General: No focal deficit present.     Mental Status: He is alert.    Review of Systems  All other systems reviewed and are negative.  Blood pressure 120/79, pulse 92, temperature 98 F (36.7 C), resp. rate 16, height 6\' 3"  (1.905 m), weight 107.5 kg, SpO2 99%. Body mass index is 29.62 kg/m.  Treatment Plan Summary: Daily contact with patient to assess and evaluate symptoms and progress in  treatment and Medication management  ASSESSMENT:   Diagnoses / Active Problems: Principal Problem: MDD (major depressive disorder) Diagnosis: Principal Problem:   MDD (major depressive disorder)     Vincent Frank is a 22 y.o., male with a past psychiatric history significant for MDD and seizure-like activity who presents to the [facility]voluntary from [facility] Emergency Department for evaluation and management of suicide attempt of overdosing on Benadryl.    PLAN: Safety and Monitoring:             -- Involuntary admission to inpatient psychiatric unit for safety, stabilization and treatment             -- Daily contact with patient to assess and evaluate symptoms and progress in treatment             -- Patient's case to be discussed in multi-disciplinary team meeting             -- Observation Level : q15 minute checks             -- Vital signs:  q12 hours             -- Precautions: suicide, elopement, and  assault   2. Medications:               Psychiatric Diagnosis and Treatment MDD, recurrent, severe -Continue Abilify 5 mg nightly (home dose was 2 mg)  -Continue Prozac 60 mg daily (home dose was 40 mg)  -Start scheduled trazodone 50 mg at bedtime as needed for insomnia  -Atarax 25 mg TID as needed for anxiety Agitation Protocol: Ativan/Benadryl/Geodon   Medical Diagnosis and Treatment Reported seizures -continue Keppra 500 BID    Patient does not need nicotine replacement   Other as needed medications  Tylenol 650 mg every 6 hours as needed for pain Mylanta 30 mL every 4 hours as needed for indigestion Milk of magnesia 30 mL daily as needed for constipation   The risks/benefits/side-effects/alternatives to the above medication were discussed in detail with the patient and time was given for questions. The patient consents to medication trial. FDA black box warnings, if present, were discussed.   The patient is agreeable with the medication plan, as above. We will monitor the patient's response to pharmacologic treatment, and adjust medications as necessary.   3. Routine and other pertinent labs: EKG monitoring: QTc: 457 on 10/29   Metabolism / endocrine: BMI: Body mass index is 29.62 kg/m.   CBC: hgb 9.4 CMP: Cr 1.35 UDS: positive benzo Ethanol: <10 UA: small leuks   4. Group Therapy:             -- Encouraged patient to participate in unit milieu and in scheduled group therapies              -- Short Term Goals: Ability to identify changes in lifestyle to reduce recurrence of condition will improve, Ability to verbalize feelings will improve, Ability to disclose and discuss suicidal ideas, Ability to demonstrate self-control will improve, Ability to identify and develop effective coping behaviors will improve, Ability to maintain clinical measurements within normal limits will improve, Compliance with prescribed medications will improve, and Ability to identify triggers  associated with substance abuse/mental health issues will improve             -- Long Term Goals: Improvement in symptoms so as ready for discharge -- Patient is encouraged to participate in group therapy while admitted to the psychiatric unit. -- We  will address other chronic and acute stressors, which contributed to the patient's MDD (major depressive disorder) in order to reduce the risk of self-harm at discharge.  4. Discharge Planning:   -- Social work and case management to assist with discharge planning and identification of hospital follow-up needs prior to discharge  -- Estimated LOS: 5-7 days   -- Discharge Concerns: Need to establish a safety plan; Medication compliance and effectiveness  -- Discharge Goals: Return home with outpatient referrals for mental health follow-up including medication management/psychotherapy   I certify that inpatient services furnished can reasonably be expected to improve the patient's condition.   This note was created using a voice recognition software as a result there may be grammatical errors inadvertently enclosed that do not reflect the nature of this encounter. Every attempt is made to correct such errors.   Dr. Liston Alba, MD PGY-2, Psychiatry Residency  11/1/20248:13 AM

## 2023-05-28 NOTE — Progress Notes (Signed)
1:1 Note: Patient lying in bed resting calmly, no distress noted. 1:1 observation in progress for pt's safety. Safety maintained.

## 2023-05-28 NOTE — Group Note (Signed)
Recreation Therapy Group Note   Group Topic:Stress Management  Group Date: 05/28/2023 Start Time: 0930 End Time: 0955 Facilitators: Cruz Bong-McCall, LRT,CTRS Location: [address] Dayroom   Group Topic: Stress Management  Goal Area(s) Addresses:  Patient will identify positive stress management techniques. Patient will identify benefits of using stress management post d/c.  Activity: Meditation. LRT explained to patients the essence of meditation and to get as relaxed as possible. LRT played a meditation that guided patients to identify things that are holding them back from reaching the goals they see for themselves.   Education:  Stress Management, Discharge Planning.   Education Outcome: Acknowledges Education   Affect/Mood: N/A   Participation Level: Did not attend    Clinical Observations/Individualized Feedback:     Plan: Continue to engage patient in RT group sessions 2-3x/week.   Amisha Pospisil-McCall, LRT,CTRS  05/28/2023 12:40 PM

## 2023-05-28 NOTE — Progress Notes (Signed)
1:1 Note: Patient lying in bed with eyes closed breathing regular. No complaints made. 1:1 continues. Safety maintained.

## 2023-05-28 NOTE — Progress Notes (Signed)
1:1 Note: Patient lying in bed resting quietly with eyes closed. No distress noted. 1:1 continues. Safety maintained.

## 2023-05-28 NOTE — Progress Notes (Signed)
   05/28/23 0810  Psych Admission Type (Psych Patients Only)  Admission Status Involuntary  Psychosocial Assessment  Patient Complaints None  Eye Contact Brief  Facial Expression Flat  Affect Depressed  Speech Logical/coherent  Interaction Assertive  Motor Activity Slow  Appearance/Hygiene Unremarkable  Behavior Characteristics Guarded  Mood Depressed  Thought Process  Coherency WDL  Content WDL  Delusions None reported or observed  Perception WDL  Hallucination None reported or observed  Judgment Impaired  Confusion None  Danger to Self  Current suicidal ideation? Denies  Self-Injurious Behavior No self-injurious ideation or behavior indicators observed or expressed   Agreement Not to Harm Self Yes  Description of Agreement Verbal  Danger to Others  Danger to Others None reported or observed  Danger to Others Abnormal  Harmful Behavior to others No threats or harm toward other people  Destructive Behavior No threats or harm toward property

## 2023-05-28 NOTE — Progress Notes (Signed)
1:1 Note: Patient sitting in wheelchair eating his dinner at this time, no distress noted. 1:1 observation in progress. Safety maintained.

## 2023-05-28 NOTE — Progress Notes (Signed)
1:1 Note: Patient resting in bed calmly , no complaints voiced. Pt tolerated lunch well. Safety maintained.

## 2023-05-28 NOTE — Progress Notes (Signed)
   05/27/23 2056  Psych Admission Type (Psych Patients Only)  Admission Status Involuntary  Psychosocial Assessment  Patient Complaints Depression;Anxiety  Eye Contact Brief  Facial Expression Flat  Affect Depressed  Speech Logical/coherent  Interaction Assertive  Motor Activity Other (Comment) (WDL)  Appearance/Hygiene Unremarkable  Behavior Characteristics Guarded  Mood Depressed  Thought Process  Coherency WDL  Content WDL  Delusions None reported or observed  Perception WDL  Hallucination None reported or observed  Judgment Poor  Confusion None  Danger to Self  Current suicidal ideation? Denies  Self-Injurious Behavior No self-injurious ideation or behavior indicators observed or expressed   Agreement Not to Harm Self Yes  Description of Agreement verbal  Danger to Others  Danger to Others None reported or observed  Danger to Others Abnormal  Harmful Behavior to others No threats or harm toward other people  Destructive Behavior No threats or harm toward property

## 2023-05-29 MED ORDER — MAGNESIUM CITRATE PO SOLN
1.0000 | Freq: Once | ORAL | Status: AC
  Administered 2023-05-29: 1 via ORAL
  Filled 2023-05-29 (×2): qty 296

## 2023-05-29 NOTE — Progress Notes (Signed)
Vincent Frank, is !:! For safety. Isolating in room but in wheelchair when in hallway.Pleasant and cooperative denying SI/HI/AVH Responding appropriately with no seizure activity observed and neuro intact Taking scheduled meds as directed

## 2023-05-29 NOTE — Progress Notes (Signed)
   05/28/23 2154  Psych Admission Type (Psych Patients Only)  Admission Status Involuntary  Psychosocial Assessment  Eye Contact Fair  Facial Expression Flat  Affect Depressed  Speech Logical/coherent  Interaction Assertive  Motor Activity Slow  Appearance/Hygiene Unremarkable  Behavior Characteristics Guarded  Mood Depressed  Thought Process  Coherency WDL  Content WDL  Delusions None reported or observed  Perception WDL  Hallucination None reported or observed  Judgment Impaired  Confusion None  Danger to Self  Current suicidal ideation? Denies  Agreement Not to Harm Self Yes  Description of Agreement verbal  Danger to Others  Danger to Others None reported or observed

## 2023-05-29 NOTE — Progress Notes (Signed)
[facility] MD Progress Note  05/29/2023 6:52 AM Vincent Frank  MRN:  [id number]  Principal Problem: MDD (major depressive disorder) Diagnosis: Principal Problem:   MDD (major depressive disorder)   Reason for Admission:  Vincent Frank is a 22 y.o. male with a history of MDD, who was initially admitted for inpatient psychiatric hospitalization on 05/25/2023 under IVC for management of concern with SI attempt with taking 25 pills of Benadryl.   (admitted on 05/25/2023, total  LOS: 4 days )   Pertinent information discussed during bed progression:  No acute concerns overnight  PRNs required overnight: None   Information Obtained Today During Patient Interview:  Patient was evaluated on the unit, exhibiting a brighter affect and intermittently using humor, stating he is "hanging in there." He agreed to participate in some group activities but reports he tends to keep to himself and has difficulty engaging on the unit.  He verbally contracted to participate in minimum to group sessions today.  The patient denies suicidal ideation, homicidal ideation, and auditory or visual hallucinations.   He reports it has been 5 days since his last bowel movement and agrees to one-time dosing of mag citrate.   Past Psychiatric History:  Previous Psych Diagnoses: MDD Prior psychiatric treatment: did not answer Psychiatric medication compliance history: compliant   Current psychiatric treatment: abilify, prozac, trazodone Current psychiatrist: most recently 2 weeks ago Current therapist: most recently weeks ago    Previous hospitalizations twice- unsure when History of suicide attempts: per chart review once 2 years ago attempting to jump off roof (currently denies) History of self harm: most recently 2.5 months ago (reports over a year ago-cutting to release stress)   Substance Use History: Alcohol: drank for weeks and stopped 8 months ago (this contrasts to stating that prior to  hospitalization when he was walking in the woods intoxicated as he reported) Hx withdrawal tremors/shakes: denies Hx alcohol related blackouts denies Hx alcohol induced hallucinations: denies Hx alcoholic seizures: denies DUI: denies   --------   Tobacco: denies Marijuana: denies Cocaine: denies Methamphetamines: denies Ecstasy: denies Opiates: denies Benzodiazepines: denies Prescribed Meds abuse: yes, Benadryl Past Medical History:  Past Medical History:  Diagnosis Date   Major depressive disorder    Seizures (HCC)    Family History:  Family History  Problem Relation Age of Onset   Healthy Mother     Current Medications: Current Facility-Administered Medications  Medication Dose Route Frequency Provider Last Rate Last Admin   acetaminophen (TYLENOL) tablet 650 mg  650 mg Oral Q6H PRN Eligha Bridegroom, NP   650 mg at 05/26/23 2135   alum & mag hydroxide-simeth (MAALOX/MYLANTA) 200-200-20 MG/5ML suspension 30 mL  30 mL Oral Q4H PRN Eligha Bridegroom, NP       ARIPiprazole (ABILIFY) tablet 5 mg  5 mg Oral QHS Kizzie Ide B, MD   5 mg at 05/28/23 2102   diphenhydrAMINE (BENADRYL) capsule 50 mg  50 mg Oral TID PRN Eligha Bridegroom, NP       Or   diphenhydrAMINE (BENADRYL) injection 50 mg  50 mg Intramuscular TID PRN Eligha Bridegroom, NP       FLUoxetine (PROZAC) capsule 60 mg  60 mg Oral Daily Kizzie Ide B, MD   60 mg at 05/28/23 0865   hydrOXYzine (ATARAX) tablet 25 mg  25 mg Oral TID PRN Eligha Bridegroom, NP   25 mg at 05/26/23 2319   levETIRAcetam (KEPPRA) tablet 500 mg  500 mg Oral BID Eligha Bridegroom, NP  500 mg at 05/28/23 1710   LORazepam (ATIVAN) tablet 2 mg  2 mg Oral TID PRN Eligha Bridegroom, NP       Or   LORazepam (ATIVAN) injection 2 mg  2 mg Intramuscular TID PRN Eligha Bridegroom, NP   2 mg at 05/25/23 2040   LORazepam (ATIVAN) tablet 2 mg  2 mg Per Tube Once Massengill, [name], MD       magnesium hydroxide (MILK OF MAGNESIA) suspension 30 mL  30 mL Oral  Daily PRN Eligha Bridegroom, NP       traZODone (DESYREL) tablet 50 mg  50 mg Oral Daily PRN Eligha Bridegroom, NP   50 mg at 05/26/23 2319   ziprasidone (GEODON) injection 20 mg  20 mg Intramuscular TID PRN Lance Muss, MD       Or   ziprasidone (GEODON) capsule 20 mg  20 mg Oral TID PRN Lance Muss, MD        Lab Results: No results found for this or any previous visit (from the past 48 hour(s)).  Blood Alcohol level:  Lab Results  Component Value Date   ETH <10 05/24/2023   ETH <10 05/23/2023    Metabolic Labs: Lab Results  Component Value Date   HGBA1C 5.2 03/03/2023   MPG 102.54 03/03/2023   MPG 96.8 07/07/2021   No results found for: "PROLACTIN" Lab Results  Component Value Date   CHOL 217 (H) 03/03/2023   TRIG 82 03/03/2023   HDL 39 (L) 03/03/2023   CHOLHDL 5.6 03/03/2023   VLDL 16 03/03/2023   LDLCALC 162 (H) 03/03/2023   LDLCALC 138 (H) 07/07/2021   Physical Findings: AIMS: No  Psychiatric Specialty Exam:  Presentation  General Appearance: Well Groomed; Appropriate for Environment Eye Contact:Good Speech:Normal Rate Speech Volume:Normal Handedness:Not assessed   Mood and Affect  Mood: "Hanging in there" Affect: Full range, brighter affect today   Thought Process  Thought Processes:Coherent  Descriptions of Associations:Intact  Orientation:Grossly intact  Thought Content:Logical  History of Schizophrenia/Schizoaffective disorder:No Duration of Psychotic Symptoms:N/A Hallucinations:Denies Ideas of Reference:Denies  Suicidal Thoughts:Denies Homicidal Thoughts:Denies  Sensorium  Memory:Immediate Fair; Recent Fair; Remote Fair  Judgment: Poor  Insight: Limited   Executive Functions  Concentration:Fair  Attention Span:Fair  Recall:Fair  Fund of Knowledge:Fair  Language:Fair   Psychomotor Activity  Psychomotor Activity:Normal  Assets  Assets:Communication Skills; Desire for Improvement; Social Support   Sleep   Sleep: Fair   Physical Exam: Physical Exam Vitals and nursing note reviewed.  Constitutional:      General: He is not in acute distress.    Appearance: Normal appearance.  HENT:     Head: Normocephalic and atraumatic.  Pulmonary:     Effort: Pulmonary effort is normal. No respiratory distress.  Musculoskeletal:        General: Normal range of motion.  Neurological:     General: No focal deficit present.     Mental Status: He is alert.   Review of Systems  All other systems reviewed and are negative.  Blood pressure 129/71, pulse 97, temperature 98 F (36.7 C), temperature source Oral, resp. rate 18, height 6\' 3"  (1.905 m), weight 107.5 kg, SpO2 99%. Body mass index is 29.62 kg/m.  Treatment Plan Summary: Daily contact with patient to assess and evaluate symptoms and progress in treatment and Medication management  ASSESSMENT:   Diagnoses / Active Problems: Principal Problem: MDD (major depressive disorder) Diagnosis: Principal Problem:   MDD (major depressive disorder)     Lynden Flemmer  Riesen is a 22 y.o., male with a past psychiatric history significant for MDD and seizure-like activity who presents to the [facility] Involuntary from [facility] Emergency Department for evaluation and management of suicide attempt of overdosing on Benadryl.    PLAN: Safety and Monitoring:             -- INVOLUNTARY admission to inpatient psychiatric unit for safety, stabilization and treatment             -- Daily contact with patient to assess and evaluate symptoms and progress in treatment             -- Patient's case to be discussed in multi-disciplinary team meeting             -- Observation Level : q15 minute checks             -- Vital signs:  q12 hours             -- Precautions: suicide, elopement, and assault   2. Medications:               Psychiatric Diagnosis and Treatment MDD, recurrent, severe -Continue Abilify 5 mg nightly (home dose was 2 mg)   -Continue Prozac 60 mg daily (home dose was 40 mg)  -Continue trazodone 50 mg at bedtime as needed for insomnia  -Continue Atarax 25 mg TID as needed for anxiety Agitation Protocol: Ativan/Benadryl/Geodon   Medical Diagnosis and Treatment Reported seizures -continue Keppra 500 BID    Patient does not need nicotine replacement   Other as needed medications  Tylenol 650 mg every 6 hours as needed for pain Mylanta 30 mL every 4 hours as needed for indigestion Milk of magnesia 30 mL daily as needed for constipation   The risks/benefits/side-effects/alternatives to the above medication were discussed in detail with the patient and time was given for questions. The patient consents to medication trial. FDA black box warnings, if present, were discussed.   The patient is agreeable with the medication plan, as above. We will monitor the patient's response to pharmacologic treatment, and adjust medications as necessary.   3. Routine and other pertinent labs: EKG monitoring: QTc: 457 on 10/29   Metabolism / endocrine: BMI: Body mass index is 29.62 kg/m.   CBC: hgb 9.4 CMP: Cr 1.35 UDS: positive benzo Ethanol: <10 UA: small leuks   4. Group Therapy:             -- Encouraged patient to participate in unit milieu and in scheduled group therapies              -- Short Term Goals: Ability to identify changes in lifestyle to reduce recurrence of condition will improve, Ability to verbalize feelings will improve, Ability to disclose and discuss suicidal ideas, Ability to demonstrate self-control will improve, Ability to identify and develop effective coping behaviors will improve, Ability to maintain clinical measurements within normal limits will improve, Compliance with prescribed medications will improve, and Ability to identify triggers associated with substance abuse/mental health issues will improve             -- Long Term Goals: Improvement in symptoms so as ready for discharge --  Patient is encouraged to participate in group therapy while admitted to the psychiatric unit. -- We will address other chronic and acute stressors, which contributed to the patient's MDD (major depressive disorder) in order to reduce the risk of self-harm at discharge.  4. Discharge Planning:   --  Social work and case management to assist with discharge planning and identification of Frank follow-up needs prior to discharge  -- Estimated LOS: 5-7 days   -- Discharge Concerns: Need to establish a safety plan; Medication compliance and effectiveness  -- Discharge Goals: Return home with outpatient referrals for mental health follow-up including medication management/psychotherapy   I certify that inpatient services furnished can reasonably be expected to improve the patient's condition.   This note was created using a voice recognition software as a result there may be grammatical errors inadvertently enclosed that do not reflect the nature of this encounter. Every attempt is made to correct such errors.   Dr. Liston Alba, MD PGY-2, Psychiatry Residency  11/2/20246:52 AM

## 2023-05-29 NOTE — Progress Notes (Signed)
Administered Bottle of Mg Citrate to Vincent Frank this morning>He is noncompliant-has not drank it. Encouraged him to drink the Mg Citrate for his constipation as well as to drink po fluids and walk.

## 2023-05-29 NOTE — [facility] Group Notes (Signed)
[facility] Group Notes:  (Nursing/MHT/Case Management/Adjunct)  Date:  05/29/2023  Time:  9:27 PM  Type of Therapy:   wrap-up group  Participation Level:  Did Not Attend  Participation Quality:    Affect:    Cognitive:    Insight:    Engagement in Group:    Modes of Intervention:    Summary of Progress/Problems:  Vincent Frank 05/29/2023, 9:27 PM

## 2023-05-29 NOTE — [facility] Group Notes (Signed)
[facility] Group Notes:  (Nursing)  Date:  05/29/2023  Time:  1600  Type of Therapy:  Psychoeducational Skills  Participation Level:  Did Not Attend  Summary of Progress/Problems:  Shela Nevin 05/29/2023, 7:08 PM

## 2023-05-30 LAB — VITAMIN B12: Vitamin B-12: 294 pg/mL (ref 180–914)

## 2023-05-30 LAB — FOLATE: Folate: 6.4 ng/mL (ref >5.9)

## 2023-05-30 MED ORDER — INFLUENZA VIRUS VACC SPLIT PF (FLUZONE) 0.5 ML IM SUSY
0.5000 mL | PREFILLED_SYRINGE | INTRAMUSCULAR | Status: DC
  Filled 2023-05-30: qty 0.5

## 2023-05-30 MED ORDER — POLYETHYLENE GLYCOL 3350 17 G PO PACK
17.0000 g | PACK | Freq: Two times a day (BID) | ORAL | Status: DC
  Administered 2023-05-30 – 2023-05-31 (×4): 17 g via ORAL
  Filled 2023-05-30 (×9): qty 1

## 2023-05-30 MED ORDER — TRAZODONE HCL 100 MG PO TABS
50.0000 mg | ORAL_TABLET | Freq: Every day | ORAL | Status: DC
  Administered 2023-05-30 – 2023-05-31 (×2): 50 mg via ORAL
  Filled 2023-05-30 (×5): qty 1

## 2023-05-30 MED ORDER — SENNOSIDES-DOCUSATE SODIUM 8.6-50 MG PO TABS
1.0000 | ORAL_TABLET | Freq: Two times a day (BID) | ORAL | Status: AC
  Administered 2023-05-30 – 2023-05-31 (×4): 1 via ORAL
  Filled 2023-05-30 (×4): qty 1

## 2023-05-30 NOTE — [facility] Group Notes (Signed)
Did not participate in emotional wellness group

## 2023-05-30 NOTE — Plan of Care (Signed)
  Problem: Education: Goal: Knowledge of [facility] Education information/materials will improve Outcome: Progressing   Problem: Activity: Goal: Interest or engagement in activities will improve Outcome: Progressing   Problem: Coping: Goal: Ability to verbalize frustrations and anger appropriately will improve Outcome: Progressing   

## 2023-05-30 NOTE — Plan of Care (Signed)
  Problem: Education: Goal: Emotional status will improve Outcome: Progressing Goal: Mental status will improve Outcome: Progressing Patient denies SI/HI/A/VH asking when was the last time he had a seizure "I was just thinking when was the last time I had a seizure because I am suppose to have them every three day"   Patient compliant with medications all fall protocol in place 1:1 Observations ongoing. Support and encouragement provided.

## 2023-05-30 NOTE — Progress Notes (Addendum)
1:1 Note 1100  Patient continues to sleep.  1:1 present for safety.  Respirations even and unlabored.  No signs/symptoms of pain/distress noted on patient's face/body movements.

## 2023-05-30 NOTE — Progress Notes (Addendum)
[facility] MD Progress Note  05/30/2023 7:01 AM Vincent Frank  MRN:  [id number]  Principal Problem: MDD (major depressive disorder) Diagnosis: Principal Problem:   MDD (major depressive disorder)   Reason for Admission:  Vincent Frank is a 22 y.o. male with a history of MDD, who was initially admitted for inpatient psychiatric hospitalization on 05/25/2023 under IVC for management of concern with SI attempt with taking 25 pills of Benadryl.   (admitted on 05/25/2023, total  LOS: 5 days )  PRNs required overnight: None   Information Obtained Today During Patient Interview:  Patient evaluated at bedside. Reports sleep was poor, reports tossing and turning all night. Agrees to have scheduled trazodone. Reports appetite is good. States mood is "I'm alive" today. When asked for improvements, pt reports "I have had some time to slow down".   Reports goals for today include participate in 2 groups. He reports he was only able to attend 1 session.   On interview, suicidal ideations are not present . Homicidal ideations are not present.   There are no auditory hallucinations, visual hallucinations, paranoid ideations, or delusional thought processes.   Patient has not spoken with family while hospitalized, was encouraged to reach out to his mom in order to carry out safety planning later this week.  Past Psychiatric History:  Previous Psych Diagnoses: MDD Prior psychiatric treatment: did not answer Psychiatric medication compliance history: compliant   Current psychiatric treatment: abilify, prozac, trazodone Current psychiatrist: most recently 2 weeks ago Current therapist: most recently weeks ago    Previous hospitalizations twice- unsure when History of suicide attempts: per chart review once 2 years ago attempting to jump off roof (currently denies) History of self harm: most recently 2.5 months ago (reports over a year ago-cutting to release stress)   Substance Use  History: Alcohol: drank for weeks and stopped 8 months ago (this contrasts to stating that prior to hospitalization when he was walking in the woods intoxicated as he reported) Hx withdrawal tremors/shakes: denies Hx alcohol related blackouts denies Hx alcohol induced hallucinations: denies Hx alcoholic seizures: denies DUI: denies   --------   Tobacco: denies Marijuana: denies Cocaine: denies Methamphetamines: denies Ecstasy: denies Opiates: denies Benzodiazepines: denies Prescribed Meds abuse: yes, Benadryl Past Medical History:  Past Medical History:  Diagnosis Date   Major depressive disorder    Seizures (HCC)    Family History:  Family History  Problem Relation Age of Onset   Healthy Mother     Current Medications: Current Facility-Administered Medications  Medication Dose Route Frequency Provider Last Rate Last Admin   acetaminophen (TYLENOL) tablet 650 mg  650 mg Oral Q6H PRN Eligha Bridegroom, NP   650 mg at 05/26/23 2135   alum & mag hydroxide-simeth (MAALOX/MYLANTA) 200-200-20 MG/5ML suspension 30 mL  30 mL Oral Q4H PRN Eligha Bridegroom, NP       ARIPiprazole (ABILIFY) tablet 5 mg  5 mg Oral QHS Kizzie Ide B, MD   5 mg at 05/29/23 2127   diphenhydrAMINE (BENADRYL) capsule 50 mg  50 mg Oral TID PRN Eligha Bridegroom, NP       Or   diphenhydrAMINE (BENADRYL) injection 50 mg  50 mg Intramuscular TID PRN Eligha Bridegroom, NP       FLUoxetine (PROZAC) capsule 60 mg  60 mg Oral Daily Kizzie Ide B, MD   60 mg at 05/29/23 0815   hydrOXYzine (ATARAX) tablet 25 mg  25 mg Oral TID PRN Eligha Bridegroom, NP   25 mg at 05/26/23  2319   levETIRAcetam (KEPPRA) tablet 500 mg  500 mg Oral BID Eligha Bridegroom, NP   500 mg at 05/29/23 1728   LORazepam (ATIVAN) tablet 2 mg  2 mg Oral TID PRN Eligha Bridegroom, NP       Or   LORazepam (ATIVAN) injection 2 mg  2 mg Intramuscular TID PRN Eligha Bridegroom, NP   2 mg at 05/25/23 2040   LORazepam (ATIVAN) tablet 2 mg  2 mg Per Tube  Once Massengill, [name], MD       magnesium hydroxide (MILK OF MAGNESIA) suspension 30 mL  30 mL Oral Daily PRN Eligha Bridegroom, NP       traZODone (DESYREL) tablet 50 mg  50 mg Oral Daily PRN Eligha Bridegroom, NP   50 mg at 05/26/23 2319   ziprasidone (GEODON) injection 20 mg  20 mg Intramuscular TID PRN Lance Muss, MD       Or   ziprasidone (GEODON) capsule 20 mg  20 mg Oral TID PRN Lance Muss, MD        Lab Results: No results found for this or any previous visit (from the past 48 hour(s)).  Blood Alcohol level:  Lab Results  Component Value Date   ETH <10 05/24/2023   ETH <10 05/23/2023    Metabolic Labs: Lab Results  Component Value Date   HGBA1C 5.2 03/03/2023   MPG 102.54 03/03/2023   MPG 96.8 07/07/2021   No results found for: "PROLACTIN" Lab Results  Component Value Date   CHOL 217 (H) 03/03/2023   TRIG 82 03/03/2023   HDL 39 (L) 03/03/2023   CHOLHDL 5.6 03/03/2023   VLDL 16 03/03/2023   LDLCALC 162 (H) 03/03/2023   LDLCALC 138 (H) 07/07/2021   Physical Findings: AIMS: No  Psychiatric Specialty Exam:  Presentation  General Appearance: Well Groomed; Appropriate for Environment Eye Contact:Good Speech:Normal Rate Speech Volume:Normal Handedness:Not assessed   Mood and Affect  Mood: "I'm alive" Affect: Full range; Congruent   Thought Process  Thought Processes:Coherent Descriptions of Associations:Intact Orientation:Grossly intact Thought Content:Logical  History of Schizophrenia/Schizoaffective disorder:No Duration of Psychotic Symptoms:N/A Hallucinations:Denies Ideas of Reference:Denies  Suicidal Thoughts:Denies Homicidal Thoughts:Denies  Sensorium  Memory:Immediate Fair; Recent Fair; Remote Fair Judgment: Poor Insight: Limited   Executive Functions  Concentration:Fair Attention Span:Fair Recall:Fair Fund of Knowledge:Fair Language:Fair   Psychomotor Activity  Psychomotor Activity:Normal  Assets   Assets:Communication Skills; Desire for Improvement; Social Support   Sleep  Sleep: Fair   Physical Exam: Physical Exam Vitals and nursing note reviewed.  Constitutional:      General: He is not in acute distress.    Appearance: Normal appearance.  HENT:     Head: Normocephalic and atraumatic.  Pulmonary:     Effort: Pulmonary effort is normal. No respiratory distress.  Musculoskeletal:        General: Normal range of motion.  Neurological:     General: No focal deficit present.     Mental Status: He is alert.    Review of Systems  All other systems reviewed and are negative.  Blood pressure 123/73, pulse 80, temperature (!) 97.4 F (36.3 C), temperature source Oral, resp. rate 20, height 6\' 3"  (1.905 m), weight 107.5 kg, SpO2 98%. Body mass index is 29.62 kg/m.  Treatment Plan Summary: Daily contact with patient to assess and evaluate symptoms and progress in treatment and Medication management  ASSESSMENT:   Diagnoses / Active Problems: Principal Problem: MDD (major depressive disorder) Diagnosis: Principal Problem:   MDD (major  depressive disorder)     Vincent Frank is a 22 y.o., male with a past psychiatric history significant for MDD and seizure-like activity who presents to the [facility] Involuntary from [facility] Emergency Department for evaluation and management of suicide attempt of overdosing on Benadryl.    PLAN: Safety and Monitoring:             -- INVOLUNTARY admission to inpatient psychiatric unit for safety, stabilization and treatment             -- Daily contact with patient to assess and evaluate symptoms and progress in treatment             -- Patient's case to be discussed in multi-disciplinary team meeting             -- Observation Level : q15 minute checks             -- Vital signs:  q12 hours             -- Precautions: suicide, elopement, and assault   2. Medications:               Psychiatric Diagnosis and  Treatment MDD, recurrent, severe -Continue Abilify 5 mg nightly (home dose was 2 mg)  -Continue Prozac 60 mg daily (home dose was 40 mg)  -Continue trazodone 50 mg at bedtime as needed for insomnia  -Continue Atarax 25 mg TID as needed for anxiety Agitation Protocol: Ativan/Benadryl/Geodon   Medical Diagnosis and Treatment Reported seizures -continue Keppra 500 BID    Constipation - unresolved -MiraLAX, Senokot Colace   Other as needed medications  Tylenol 650 mg every 6 hours as needed for pain Mylanta 30 mL every 4 hours as needed for indigestion Milk of magnesia 30 mL daily as needed for constipation   The risks/benefits/side-effects/alternatives to the above medication were discussed in detail with the patient and time was given for questions. The patient consents to medication trial. FDA black box warnings, if present, were discussed.   The patient is agreeable with the medication plan, as above. We will monitor the patient's response to pharmacologic treatment, and adjust medications as necessary.   3. Routine and other pertinent labs: EKG monitoring: QTc: 457 on 10/29   Metabolism / endocrine: BMI: Body mass index is 29.62 kg/m.   CBC: hgb 9.4 CMP: Cr 1.35 UDS: positive benzo Ethanol: <10 UA: small leuks   4. Group Therapy:             -- Encouraged patient to participate in unit milieu and in scheduled group therapies              -- Short Term Goals: Ability to identify changes in lifestyle to reduce recurrence of condition will improve, Ability to verbalize feelings will improve, Ability to disclose and discuss suicidal ideas, Ability to demonstrate self-control will improve, Ability to identify and develop effective coping behaviors will improve, Ability to maintain clinical measurements within normal limits will improve, Compliance with prescribed medications will improve, and Ability to identify triggers associated with substance abuse/mental health issues will  improve             -- Long Term Goals: Improvement in symptoms so as ready for discharge -- Patient is encouraged to participate in group therapy while admitted to the psychiatric unit. -- We will address other chronic and acute stressors, which contributed to the patient's MDD (major depressive disorder) in order to reduce the risk of self-harm at  discharge.  4. Discharge Planning:   -- Social work and case management to assist with discharge planning and identification of hospital follow-up needs prior to discharge  -- Estimated LOS: Likely Tues-Wed  -- Discharge Concerns: Need to establish a safety plan; Medication compliance and effectiveness  -- Discharge Goals: Return home with outpatient referrals for mental health follow-up including medication management/psychotherapy   I certify that inpatient services furnished can reasonably be expected to improve the patient's condition.   This note was created using a voice recognition software as a result there may be grammatical errors inadvertently enclosed that do not reflect the nature of this encounter. Every attempt is made to correct such errors.   Dr. Liston Alba, MD PGY-2, Psychiatry Residency  11/3/20247:01 AM

## 2023-05-30 NOTE — [facility] Group Notes (Signed)
Did not participate in Orientation group

## 2023-05-30 NOTE — Progress Notes (Signed)
1:1 Note Pt observed laying down in bed, pt stated he had a good day, stated he is feeling much better. Denied seizure episode, denied SI/HI and contracted for safety, remains on 1:1 for safety, will continue to monitor.

## 2023-05-30 NOTE — Progress Notes (Addendum)
1:1 Note 1330  Patient continues to sleep in his bed.  Marland Kitchen  Respirations even and unlabored.  No signs/symptoms of pain/distress noted on patient's face/body movements.  Safety maintained with 1:1 per MD order.  Marland Kitchen

## 2023-05-30 NOTE — Plan of Care (Signed)
Nurse discussed anxiety, depression and coping skills with patient.  

## 2023-05-30 NOTE — Progress Notes (Signed)
1:1 Note Pt is currently a sleep, breathing is easy and normal. Pt took his medication before bed without issue. No fall observed or reported. Continues to be on 1:1 for fall, will continues to monitor.

## 2023-05-30 NOTE — Progress Notes (Signed)
1:1 Note 1630   Patient has been sleeping this afternoon in his bed.  1:1 present for safety.  Respirations even and unlabored.  No signs/symptoms of pain/distress noted on patient's face/body movements.  1:1 continues per MD order.

## 2023-05-30 NOTE — Group Note (Signed)
Date:  05/30/2023 Time:  9:10 PM  Group Topic/Focus:  Wrap-Up Group:   The focus of this group is to help patients review their daily goal of treatment and discuss progress on daily workbooks.    Participation Level:  Did Not Attend   Additional Comments:pt was not present for tonights wrap up group.   Bing Plume D 05/30/2023, 9:10 PM

## 2023-05-30 NOTE — [facility] Group Notes (Signed)
Type of Therapy and Topic:  Group Therapy: Self Care  Participation Level:  Did Not Attend   Description of Group:   In this group, patients shared and discussed the importance of acknowledging the elements in their lives for which they are taking care of their self mentally and how this can positively impact their mood.  The group discussed how bringing the positive elements of their lives to the forefront of their minds can help with recovery from any illness, physical or mental.  An exercise was done as a group in which a list was made for self care items in order to encourage participants to consider other potential positives in their lives.  Therapeutic Goals: Patients will identify one or more item for which they are taking care of themself mentally  Patients will discuss how it is possible to seek self care in even bad situations. Patients will explore other possible items of self care that they could remember.   Summary of Patient Progress:  NA  Therapeutic Modalities:   Cognitive Behavioral Therapy MI

## 2023-05-30 NOTE — Progress Notes (Signed)
1:1 Note Pt is a sleep at this time, no problems noted, remains on 1:1 for safety, will continue to monitor.

## 2023-05-30 NOTE — Progress Notes (Signed)
1:1 Note 1000  Patient continues to sleep.  1:1 present for safety.  Respirations even and unlabored.  No signs/symptoms of pain/distress noted on patient's face/body movements.

## 2023-05-30 NOTE — Progress Notes (Signed)
1:1 Note 1815  Patient has been in bed most of the day.  Ate 100% of his dinner.  Respirations even and unlabored.  No signs/symptoms of pain/distress noted on patient's face/body movements.  Denied SI and HI, contracts for safety.  Denied A/V hallucinations.  1:1 continues per MD order for safety.

## 2023-05-30 NOTE — Progress Notes (Signed)
1:1 Note 08:00 Patient went to dining room for breakfast this morning using his wheelchair.  1:1 continues for patient's safety.  Patient took his 8:00 am morning medications.  Patient is presently laying in bed sleeping.  Patient had denied SI and HI, contracts for safety.  Denied A/V hallucinations.  Denied pain.  Safety maintained with 1:1 per MD order.

## 2023-05-31 ENCOUNTER — Inpatient Hospital Stay (HOSPITAL_COMMUNITY): Payer: BC Managed Care – PPO

## 2023-05-31 ENCOUNTER — Encounter (HOSPITAL_COMMUNITY): Payer: Self-pay

## 2023-05-31 ENCOUNTER — Other Ambulatory Visit: Payer: Self-pay

## 2023-05-31 LAB — VITAMIN D 25 HYDROXY (VIT D DEFICIENCY, FRACTURES): Vit D, 25-Hydroxy: 29.2 ng/mL — ABNORMAL LOW (ref 30–100)

## 2023-05-31 LAB — RPR: RPR Ser Ql: NONREACTIVE

## 2023-05-31 MED ORDER — KETOROLAC TROMETHAMINE 30 MG/ML IJ SOLN
30.0000 mg | Freq: Once | INTRAMUSCULAR | Status: AC
  Administered 2023-05-31: 30 mg via INTRAVENOUS
  Filled 2023-05-31: qty 1

## 2023-05-31 MED ORDER — VITAMIN D 25 MCG (1000 UNIT) PO TABS
2000.0000 [IU] | ORAL_TABLET | Freq: Every day | ORAL | Status: DC
  Administered 2023-05-31: 2000 [IU] via ORAL
  Filled 2023-05-31 (×4): qty 2

## 2023-05-31 MED ORDER — LEVETIRACETAM IN NACL 500 MG/100ML IV SOLN
500.0000 mg | Freq: Once | INTRAVENOUS | Status: AC
  Administered 2023-05-31: 500 mg via INTRAVENOUS
  Filled 2023-05-31: qty 100

## 2023-05-31 NOTE — Progress Notes (Signed)
[facility] MD Progress Note  05/31/2023 8:58 PM Vincent Frank  MRN:  [id number]  Principal Problem: MDD (major depressive disorder) Diagnosis: Principal Problem:   MDD (major depressive disorder)   Reason for Admission:  Vincent Frank is a 22 y.o. male with a history of MDD, who was initially admitted for inpatient psychiatric hospitalization on 05/25/2023 under IVC for management of concern with SI attempt with taking 25 pills of Benadryl.   (admitted on 05/25/2023, total  LOS: 6 days )  PRNs required overnight: None   Information Obtained Today During Patient Interview:  Vincent Frank was seen in his room during rounds.  He reported that his mood was "good" today.  He denies any new psychiatric or medical complaints.  He reports that his appetite is good.  He reports that focus and concentration are adequate.  He denies issues with energy.  He reports adequate sleep.  He denies any medication side effects.  He denies suicidal ideations, homicidal ideations, auditory hallucinations, visual hallucinations, or delusions.  He continues to spend most of his time in bed and is minimally participative in groups.  Reports that his hip pain is a little bit better today and he is walking better than previous days.  He states that he still not been in touch with his mother since admission.  I encouraged him to call and report progress.   Past Psychiatric History:  Previous Psych Diagnoses: MDD Prior psychiatric treatment: did not answer Psychiatric medication compliance history: compliant   Current psychiatric treatment: abilify, prozac, trazodone Current psychiatrist: most recently 2 weeks ago Current therapist: most recently weeks ago    Previous hospitalizations twice- unsure when History of suicide attempts: per chart review once 2 years ago attempting to jump off roof (currently denies) History of self harm: most recently 2.5 months ago (reports over a year ago-cutting to release  stress)   Substance Use History: Alcohol: drank for weeks and stopped 8 months ago (this contrasts to stating that prior to hospitalization when he was walking in the woods intoxicated as he reported) Hx withdrawal tremors/shakes: denies Hx alcohol related blackouts denies Hx alcohol induced hallucinations: denies Hx alcoholic seizures: denies DUI: denies   --------   Tobacco: denies Marijuana: denies Cocaine: denies Methamphetamines: denies Ecstasy: denies Opiates: denies Benzodiazepines: denies Prescribed Meds abuse: yes, Benadryl Past Medical History:  Past Medical History:  Diagnosis Date   Major depressive disorder    Seizures (HCC)    Family History:  Family History  Problem Relation Age of Onset   Healthy Mother     Current Medications: Current Facility-Administered Medications  Medication Dose Route Frequency Provider Last Rate Last Admin   acetaminophen (TYLENOL) tablet 650 mg  650 mg Oral Q6H PRN Eligha Bridegroom, NP   650 mg at 05/26/23 2135   alum & mag hydroxide-simeth (MAALOX/MYLANTA) 200-200-20 MG/5ML suspension 30 mL  30 mL Oral Q4H PRN Eligha Bridegroom, NP       ARIPiprazole (ABILIFY) tablet 5 mg  5 mg Oral QHS Kizzie Ide B, MD   5 mg at 05/30/23 2137   cholecalciferol (VITAMIN D3) 25 MCG (1000 UNIT) tablet 2,000 Units  2,000 Units Oral Daily Golda Acre, MD   2,000 Units at 05/31/23 1838   diphenhydrAMINE (BENADRYL) capsule 50 mg  50 mg Oral TID PRN Eligha Bridegroom, NP       Or   diphenhydrAMINE (BENADRYL) injection 50 mg  50 mg Intramuscular TID PRN Eligha Bridegroom, NP       FLUoxetine (  PROZAC) capsule 60 mg  60 mg Oral Daily Kizzie Ide B, MD   60 mg at 05/31/23 5621   hydrOXYzine (ATARAX) tablet 25 mg  25 mg Oral TID PRN Eligha Bridegroom, NP   25 mg at 05/26/23 2319   influenza vac split trivalent PF (FLULAVAL) injection 0.5 mL  0.5 mL Intramuscular Tomorrow-1000 Abbott Pao, Nadir, MD       levETIRAcetam (KEPPRA) tablet 500 mg  500 mg Oral  BID Eligha Bridegroom, NP   500 mg at 05/31/23 1655   LORazepam (ATIVAN) tablet 2 mg  2 mg Oral TID PRN Eligha Bridegroom, NP       Or   LORazepam (ATIVAN) injection 2 mg  2 mg Intramuscular TID PRN Eligha Bridegroom, NP   2 mg at 05/25/23 2040   LORazepam (ATIVAN) tablet 2 mg  2 mg Per Tube Once Massengill, [name], MD       magnesium hydroxide (MILK OF MAGNESIA) suspension 30 mL  30 mL Oral Daily PRN Eligha Bridegroom, NP       polyethylene glycol (MIRALAX / GLYCOLAX) packet 17 g  17 g Oral BID Carrion-Carrero, Karle Starch, MD   17 g at 05/31/23 1655   traZODone (DESYREL) tablet 50 mg  50 mg Oral QHS Carrion-Carrero, Karle Starch, MD   50 mg at 05/30/23 2137   ziprasidone (GEODON) injection 20 mg  20 mg Intramuscular TID PRN Lance Muss, MD       Or   ziprasidone (GEODON) capsule 20 mg  20 mg Oral TID PRN Lance Muss, MD       Current Outpatient Medications  Medication Sig Dispense Refill   acetaminophen (TYLENOL) 325 MG tablet Take 2 tablets (650 mg total) by mouth every 6 (six) hours as needed for mild pain (or Fever >/= 101). (Patient not taking: Reported on 05/25/2023)     ARIPiprazole (ABILIFY) 2 MG tablet Take 1 tablet (2 mg total) by mouth at bedtime. Antidepressant augmentation 30 tablet 0   fenofibrate (TRICOR) 145 MG tablet Take 145 mg by mouth daily.     ferrous sulfate 325 (65 FE) MG tablet Take 1 tablet (325 mg total) by mouth daily with breakfast. (Patient not taking: Reported on 05/25/2023)     FLUoxetine (PROZAC) 40 MG capsule Take 1 capsule (40 mg total) by mouth at bedtime. For depression 30 capsule 0   hydrOXYzine (ATARAX) 25 MG tablet Take 1 tablet (25 mg total) by mouth every 6 (six) hours as needed for anxiety. (Patient not taking: Reported on 05/25/2023) 75 tablet 0   levETIRAcetam (KEPPRA) 500 MG tablet Take 500 mg by mouth 2 (two) times daily.     polyethylene glycol (MIRALAX / GLYCOLAX) 17 g packet Take 17 g by mouth daily as needed for mild constipation. (Patient not  taking: Reported on 05/25/2023) 14 each 0   traZODone (DESYREL) 50 MG tablet Take 1 tablet by mouth daily as needed for sleep.      Lab Results:  Results for orders placed or performed during the hospital encounter of 05/25/23 (from the past 48 hour(s))  Folate     Status: None   Collection Time: 05/30/23  6:24 PM  Result Value Ref Range   Folate 6.4 >5.9 ng/mL    Comment: Performed at [facility] , [address], [place], [place] [address]  Vitamin B12     Status: None   Collection Time: 05/30/23  6:24 PM  Result Value Ref Range   Vitamin B-12 294 180 - 914  pg/mL    Comment: (NOTE) This assay is not validated for testing neonatal or myeloproliferative syndrome specimens for Vitamin B12 levels. Performed at [facility], [address] [place], [place] [address]   VITAMIN D 25 Hydroxy (Vit-D Deficiency, Fractures)     Status: Abnormal   Collection Time: 05/30/23  6:24 PM  Result Value Ref Range   Vit D, 25-Hydroxy 29.20 (L) 30 - 100 ng/mL    Comment: (NOTE) Vitamin D deficiency has been defined by the Institute of Medicine  and an Endocrine Society practice guideline as a level of serum 25-OH  vitamin D less than 20 ng/mL (1,2). The Endocrine Society went on to  further define vitamin D insufficiency as a level between 21 and 29  ng/mL (2).  1. IOM (Institute of Medicine). 2010. Dietary reference intakes for  calcium and D. [place]: [employer]. 2. Holick MF, Binkley Chisholm, Bischoff-Ferrari HA, et al. Evaluation,  treatment, and prevention of vitamin D deficiency: an Endocrine  Society clinical practice guideline, JCEM. 2011 Jul; 96(7): 1911-30.  Performed at [facility], [address], [place], [place] [address]   RPR     Status: None   Collection Time: 05/30/23  6:24 PM  Result Value Ref Range   RPR Ser Ql NON REACTIVE NON REACTIVE    Comment: Performed at [facility], [address]  [place], [place] [address]    Blood Alcohol level:  Lab Results  Component Value Date   [facility] <10 05/24/2023   ETH <10 05/23/2023    Metabolic Labs: Lab Results  Component Value Date   HGBA1C 5.2 03/03/2023   MPG 102.54 03/03/2023   MPG 96.8 07/07/2021   No results found for: "PROLACTIN" Lab Results  Component Value Date   CHOL 217 (H) 03/03/2023   TRIG 82 03/03/2023   HDL 39 (L) 03/03/2023   CHOLHDL 5.6 03/03/2023   VLDL 16 03/03/2023   LDLCALC 162 (H) 03/03/2023   LDLCALC 138 (H) 07/07/2021   Physical Findings: AIMS: No  Psychiatric Specialty Exam:  Presentation  General Appearance: Well Groomed; Appropriate for Environment Eye Contact:Good Speech:Normal Rate Speech Volume:Normal Handedness:Not assessed   Mood and Affect  Mood: "I'm alive" Affect: Full range; Congruent   Thought Process  Thought Processes:Coherent Descriptions of Associations:Intact Orientation:Grossly intact Thought Content:Logical  History of Schizophrenia/Schizoaffective disorder:No Duration of Psychotic Symptoms:N/A Hallucinations:Denies Ideas of Reference:Denies  Suicidal Thoughts:Denies Homicidal Thoughts:Denies  Sensorium  Memory:Immediate Fair; Recent Fair; Remote Fair Judgment: Poor Insight: Limited   Executive Functions  Concentration:Fair Attention Span:Fair Recall:Fair Fund of Knowledge:Fair Language:Fair   Psychomotor Activity  Psychomotor Activity:Normal  Assets  Assets:Communication Skills; Desire for Improvement; Social Support   Sleep  Sleep: Fair   Physical Exam: Physical Exam Vitals and nursing note reviewed.  Constitutional:      General: He is not in acute distress.    Appearance: Normal appearance.  HENT:     Head: Normocephalic and atraumatic.  Pulmonary:     Effort: Pulmonary effort is normal. No respiratory distress.  Musculoskeletal:        General: Normal range of motion.  Neurological:     General: No focal deficit present.      Mental Status: He is alert.    Review of Systems  All other systems reviewed and are negative.  Blood pressure (!) 130/91, pulse 70, temperature 98 F (36.7 C), temperature source Oral, resp. rate 16, height 6\' 3"  (1.905 m), weight 107.5 kg,  SpO2 99%. Body mass index is 29.62 kg/m.  Treatment Plan Summary: Daily contact with patient to assess and evaluate symptoms and progress in treatment and Medication management  ASSESSMENT:   Diagnoses / Active Problems: Principal Problem: MDD (major depressive disorder) Diagnosis: Principal Problem:   MDD (major depressive disorder)     HERNAN TURNAGE is a 22 y.o., male with a past psychiatric history significant for MDD and seizure-like activity who [facility] the [facility] Emergency Department for evaluation and management of suicide attempt of overdosing on Benadryl.    PLAN: Safety and Monitoring:             -- INVOLUNTARY admission to inpatient psychiatric unit for safety, stabilization and treatment             -- Daily contact with patient to assess and evaluate symptoms and progress in treatment             -- Patient's case to be discussed in multi-disciplinary team meeting             -- Observation Level : q15 minute checks             -- Vital signs:  q12 hours             -- Precautions: suicide, elopement, and assault   2. Medications:               Psychiatric Diagnosis and Treatment MDD, recurrent, severe -Continue Abilify 5 mg nightly (home dose was 2 mg)  -Continue Prozac 60 mg daily (home dose was 40 mg)  -Continue trazodone 50 mg at bedtime as needed for insomnia  -Continue Atarax 25 mg TID as needed for anxiety Agitation Protocol: Ativan/Benadryl/Geodon   Medical Diagnosis and Treatment Reported seizures -continue Keppra 500 BID    Constipation - unresolved -MiraLAX, Senokot Colace   Other as needed medications  Tylenol 650 mg every 6 hours as needed for  pain Mylanta 30 mL every 4 hours as needed for indigestion Milk of magnesia 30 mL daily as needed for constipation   The risks/benefits/side-effects/alternatives to the above medication were discussed in detail with the patient and time was given for questions. The patient consents to medication trial. FDA black box warnings, if present, were discussed.   The patient is agreeable with the medication plan, as above. We will monitor the patient's response to pharmacologic treatment, and adjust medications as necessary.   3. Routine and other pertinent labs: EKG monitoring: QTc: 457 on 10/29   Metabolism / endocrine: BMI: Body mass index is 29.62 kg/m.   CBC: hgb 9.4 CMP: Cr 1.35 UDS: positive benzo Ethanol: <10 UA: small leuks   4. Group Therapy:             -- Encouraged patient to participate in unit milieu and in scheduled group therapies              -- Short Term Goals: Ability to identify changes in lifestyle to reduce recurrence of condition will improve, Ability to verbalize feelings will improve, Ability to disclose and discuss suicidal ideas, Ability to demonstrate self-control will improve, Ability to identify and develop effective coping behaviors will improve, Ability to maintain clinical measurements within normal limits will improve, Compliance with prescribed medications will improve, and Ability to identify triggers associated with substance abuse/mental health issues will improve             -- Long Term Goals: Improvement in  symptoms so as ready for discharge -- Patient is encouraged to participate in group therapy while admitted to the psychiatric unit. -- We will address other chronic and acute stressors, which contributed to the patient's MDD (major depressive disorder) in order to reduce the risk of self-harm at discharge.  4. Discharge Planning:   -- Social work and case management to assist with discharge planning and identification of hospital follow-up needs  prior to discharge  -- Estimated LOS: Likely Tues-Wed  -- Discharge Concerns: Need to establish a safety plan; Medication compliance and effectiveness  -- Discharge Goals: Return home with outpatient referrals for mental health follow-up including medication management/psychotherapy   I certify that inpatient services furnished can reasonably be expected to improve the patient's condition.   This note was created using a voice recognition software as a result there may be grammatical errors inadvertently enclosed that do not reflect the nature of this encounter. Every attempt is made to correct such errors.   Criss Alvine, MD Psychiatrist  11/4/20248:58 PM    Total Time Spent in Direct Patient Care:  I personally spent 35 minutes on the unit in direct patient care. The direct patient care time included face-to-face time with the patient, reviewing the patient's chart, communicating with other professionals, and coordinating care. Greater than 50% of this time was spent in counseling or coordinating care with the patient regarding goals of hospitalization, psycho-education, and discharge planning needs.

## 2023-05-31 NOTE — ED Notes (Signed)
Pt BIB [facility] from [facility] d/t seizure at 1900 that lasted 2 minutes - could not describe but did have a "body tensing" with EMS - BP and HR were exact same before and fter.  Pt has hx of "psuedo Seizures"  pt was given anti Sz meds at 1700 - Keppra.

## 2023-05-31 NOTE — Plan of Care (Signed)
  Problem: Education: Goal: Emotional status will improve Outcome: Progressing Goal: Mental status will improve Outcome: Progressing Goal: Verbalization of understanding the information provided will improve Outcome: Progressing   Problem: Activity: Goal: Interest or engagement in activities will improve Outcome: Progressing Goal: Sleeping patterns will improve Outcome: Progressing   Problem: Coping: Goal: Ability to verbalize frustrations and anger appropriately will improve Outcome: Progressing Goal: Ability to demonstrate self-control will improve Outcome: Progressing   Problem: Health Behavior/Discharge Planning: Goal: Identification of resources available to assist in meeting health care needs will improve Outcome: Progressing Goal: Compliance with treatment plan for underlying cause of condition will improve Outcome: Progressing   Problem: Physical Regulation: Goal: Ability to maintain clinical measurements within normal limits will improve Outcome: Progressing

## 2023-05-31 NOTE — Progress Notes (Signed)
[facility] LCSW Progress Note   05/31/2023    10:28 AM  Gardiner Barefoot J Hyams      Type of Note: Collateral with Mom    CSW spoke with mom briefly and provided an update on patient based on progression meeting this morning and chart notes. Mom was appreciative of information but asked for the attending doctor to give her a call. CSW sent the doctor a message regarding this request. CSW will continue to assist.     Signed:   Jacob Moores, MSW, [facility] 05/31/2023 10:28 AM

## 2023-05-31 NOTE — Group Note (Signed)
Recreation Therapy Group Note   Group Topic:Team Building  Group Date: 05/31/2023 Start Time: 0943 End Time: 1018 Facilitators: Johnella Crumm-McCall, LRT,CTRS Location: [address] Dayroom   Group Topic: Communication, Team Building, Problem Solving  Goal Area(s) Addresses:  Patient will effectively work with peer towards shared goal.  Patient will identify skills used to make activity successful.  Patient will identify how skills used during activity can be applied to reach post d/c goals.   Intervention: STEM Activity- Glass blower/designer  Group Description: Tallest Pharmacist, community. In teams of 5-6, patients were given 11 craft pipe cleaners. Using the materials provided, patients were instructed to compete again the opposing team(s) to build the tallest free-standing structure from floor level. The activity was timed; difficulty increased by Clinical research associate as Production designer, theatre/television/film continued.  Systematically resources were removed with additional directions for example, placing one arm behind their back, working in silence, and shape stipulations. LRT facilitated post-activity discussion reviewing team processes and necessary communication skills involved in completion. Patients were encouraged to reflect how the skills utilized, or not utilized, in this activity can be incorporated to positively impact support systems post discharge.  Education: Pharmacist, community, Scientist, physiological, Discharge Planning   Education Outcome: Acknowledges education/In group clarification offered/Needs additional education.   Affect/Mood: N/A   Participation Level: Did not attend    Clinical Observations/Individualized Feedback:     Plan: Continue to engage patient in RT group sessions 2-3x/week.   Zidan Helget-McCall, LRT,CTRS 05/31/2023 12:28 PM

## 2023-05-31 NOTE — Progress Notes (Signed)
   05/31/23 0900  Psych Admission Type (Psych Patients Only)  Admission Status Involuntary  Psychosocial Assessment  Patient Complaints None  Eye Contact Fair  Facial Expression Flat  Affect Flat  Speech Logical/coherent  Interaction Minimal  Motor Activity Slow  Appearance/Hygiene Unremarkable  Behavior Characteristics Guarded  Mood Pleasant  Thought Process  Coherency WDL  Content WDL  Delusions None reported or observed  Perception WDL  Hallucination None reported or observed  Judgment Poor  Confusion None  Danger to Self  Current suicidal ideation? Denies  Agreement Not to Harm Self Yes  Description of Agreement verbal  Danger to Others  Danger to Others None reported or observed  Danger to Others Abnormal  Harmful Behavior to others Threats of violence towards other people observed or expressed

## 2023-05-31 NOTE — ED Provider Notes (Signed)
[place] EMERGENCY DEPARTMENT AT [facility] Provider Note   CSN: [id number] Arrival date & time: 05/25/23  2141     History  Chief Complaint  Patient presents with   Seizures    Vincent Frank is a 22 y.o. male presenting from behavioral health hospital with concern for seizure-like activity.  Patient reportedly had an episode of generalized shaking and seizure type activity.  He was being transported to the hospital and another episode by EMS was given Versed by EMS.  Patient says that he feels "lousy" and has a headache on arrival.  I reviewed his external records and he has been extensively worked up for seizures, and carries a diagnosis of psychogenic nonepileptic seizures.  He has intermittently been on and off Keppra.  Currently the patient is on Keppra and taking it twice daily.  However he has had nondescript EEGs, and CT head as recently at 05/18/23 at [facility] (no acute findings).  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 Has been managed on Keppra 500 mg twice daily. Pt reiterates he has been taking most of the time. Presented to the ED with concerns of recurrent seizures at home but was unwitnessed. Lives with his parents. He did have 2 witnessed seizures back-to-back in the ER. CT head was negative for any hemorrhage or obvious infarct. Transferred to [facility] for neurology evaluation. Neurology evaluated and captured event on EEG that was non-epileptiform. Suspect psychogenic and pt has a VERY complicated psychiatric hx. Neurology stopped keppra. Neurology signed off with no further recommendations.."   HPI     Home Medications Prior to Admission medications   Medication Sig Start Date End Date Taking? Authorizing Provider  acetaminophen  (TYLENOL) 325 MG tablet Take 2 tablets (650 mg total) by mouth every 6 (six) hours as needed for mild pain (or Fever >/= 101). Patient not taking: Reported on 05/25/2023 04/12/23   Alford Highland, MD  ARIPiprazole (ABILIFY) 2 MG tablet Take 1 tablet (2 mg total) by mouth at bedtime. Antidepressant augmentation 03/20/23   Armandina Stammer I, NP  fenofibrate (TRICOR) 145 MG tablet Take 145 mg by mouth daily. 04/30/23   [provider]  ferrous sulfate 325 (65 FE) MG tablet Take 1 tablet (325 mg total) by mouth daily with breakfast. Patient not taking: Reported on 05/25/2023 04/13/23   Alford Highland, MD  FLUoxetine (PROZAC) 40 MG capsule Take 1 capsule (40 mg total) by mouth at bedtime. For depression 03/20/23   Armandina Stammer I, NP  hydrOXYzine (ATARAX) 25 MG tablet Take 1 tablet (25 mg total) by mouth every 6 (six) hours as needed for anxiety. Patient not taking: Reported on 05/25/2023 03/20/23   Armandina Stammer I, NP  levETIRAcetam (KEPPRA) 500 MG tablet Take 500 mg by mouth 2 (two) times daily. 05/04/23 05/03/24  [provider]  polyethylene glycol (MIRALAX / GLYCOLAX) 17 g packet Take 17 g by mouth daily as needed for mild constipation. Patient not taking: Reported on 05/25/2023 04/12/23   Alford Highland, MD  traZODone (DESYREL) 50 MG tablet Take 1 tablet by mouth daily as needed for sleep. 05/04/23 05/03/24  [provider]      Allergies    Bee venom, Haldol [haloperidol], Hornet venom, and Gabapentin    Review of Systems   Review of Systems  Physical Exam Updated  Vital Signs BP 103/83   Pulse 60   Temp 98 F (36.7 C) (Oral)   Resp 20   Ht 6\' 3"  (1.905 m)   Wt 107.5 kg   SpO2 99%   BMI 29.62 kg/m  Physical Exam Constitutional:      General: He is not in acute distress.    Comments: Drowsy, awakes and responds to commands  HENT:     Head: Normocephalic and atraumatic.  Eyes:     Conjunctiva/sclera: Conjunctivae normal.     Pupils: Pupils are equal, round,  and reactive to light.  Cardiovascular:     Rate and Rhythm: Normal rate and regular rhythm.  Pulmonary:     Effort: Pulmonary effort is normal. No respiratory distress.  Skin:    General: Skin is warm and dry.  Neurological:     General: No focal deficit present.     Mental Status: He is alert and oriented to person, place, and time. Mental status is at baseline.     ED Results / Procedures / Treatments   Labs (all labs ordered are listed, but only abnormal results are displayed) Labs Reviewed  CBC WITH DIFFERENTIAL/PLATELET - Abnormal; Notable for the following components:      Result Value   RBC 3.68 (*)    Hemoglobin 9.4 (*)    HCT 29.8 (*)    MCH 25.5 (*)    All other components within normal limits  BASIC METABOLIC PANEL - Abnormal; Notable for the following components:   CO2 21 (*)    Creatinine, Ser 1.36 (*)    Calcium 8.8 (*)    All other components within normal limits  VITAMIN D 25 HYDROXY (VIT D DEFICIENCY, FRACTURES) - Abnormal; Notable for the following components:   Vit D, 25-Hydroxy 29.20 (*)    All other components within normal limits  FOLATE  VITAMIN B12  RPR  CBG MONITORING, ED  I-STAT CG4 LACTIC ACID, ED    EKG None  Radiology CT Head Wo Contrast  Result Date: 05/31/2023 CLINICAL DATA:  Altered mental status EXAM: CT HEAD WITHOUT CONTRAST TECHNIQUE: Contiguous axial images were obtained from the base of the skull through the vertex without intravenous contrast. RADIATION DOSE REDUCTION: This exam was performed according to the departmental dose-optimization program which includes automated exposure control, adjustment of the mA and/or kV according to patient size and/or use of iterative reconstruction technique. COMPARISON:  05/04/2023 FINDINGS: Brain: No evidence of acute infarction, hemorrhage, hydrocephalus, extra-axial collection or mass lesion/mass effect. Vascular: No hyperdense vessel or unexpected calcification. Skull: Normal. Negative for  fracture or focal lesion. Sinuses/Orbits: The visualized paranasal sinuses are essentially clear. The mastoid air cells are unopacified. Other: None. IMPRESSION: Normal head CT. Electronically Signed   By: Charline Bills M.D.   On: 05/31/2023 22:54    Procedures Procedures    Medications Ordered in ED Medications  levETIRAcetam (KEPPRA) tablet 500 mg (500 mg Oral Given 05/31/23 1655)  acetaminophen (TYLENOL) tablet 650 mg (650 mg Oral Given 05/26/23 2135)  alum & mag hydroxide-simeth (MAALOX/MYLANTA) 200-200-20 MG/5ML suspension 30 mL (has no administration in time range)  magnesium hydroxide (MILK OF MAGNESIA) suspension 30 mL (has no administration in time range)  LORazepam (ATIVAN) tablet 2 mg ( Oral See Alternative 05/25/23 2045)    Or  LORazepam (ATIVAN) injection 2 mg (2 mg Intramuscular Not Given 05/25/23 2045)  diphenhydrAMINE (BENADRYL) capsule 50 mg (has no administration in time range)    Or  diphenhydrAMINE (BENADRYL) injection 50 mg (  has no administration in time range)  hydrOXYzine (ATARAX) tablet 25 mg (25 mg Oral Given 05/26/23 2319)  LORazepam (ATIVAN) tablet 2 mg (2 mg Per Tube Not Given 05/25/23 2232)  ammonia inhalant (  Not Given 05/26/23 0538)  ARIPiprazole (ABILIFY) tablet 5 mg (5 mg Oral Given 05/30/23 2137)  FLUoxetine (PROZAC) capsule 60 mg (60 mg Oral Given 05/31/23 0822)  ziprasidone (GEODON) injection 20 mg (has no administration in time range)    Or  ziprasidone (GEODON) capsule 20 mg (has no administration in time range)  polyethylene glycol (MIRALAX / GLYCOLAX) packet 17 g (17 g Oral Given 05/31/23 1655)  traZODone (DESYREL) tablet 50 mg (50 mg Oral Given 05/30/23 2137)  influenza vac split trivalent PF (FLULAVAL) injection 0.5 mL (0.5 mLs Intramuscular Patient Refused/Not Given 05/31/23 1132)  cholecalciferol (VITAMIN D3) 25 MCG (1000 UNIT) tablet 2,000 Units (2,000 Units Oral Given 05/31/23 1838)  levETIRAcetam (KEPPRA) IVPB 500 mg/100 mL premix (has no  administration in time range)  ketorolac (TORADOL) 30 MG/ML injection 30 mg (has no administration in time range)  magnesium citrate solution 1 Bottle (1 Bottle Oral Given 05/29/23 1228)  senna-docusate (Senokot-S) tablet 1 tablet (1 tablet Oral Given 05/31/23 1655)    ED Course/ Medical Decision Making/ A&P                                 Medical Decision Making Amount and/or Complexity of Data Reviewed Labs: ordered. Radiology: ordered.  Risk Prescription drug management.   Patient presented to ED after reported seizure-like activity occurring at the behavioral health hospital as well as for the paramedics.  Patient is awake with a GCS of 15 on arrival, drowsy, complaining of a headache.  CT of the head was performed and personally reviewed with no emergent findings.  Patient's vital signs are otherwise unremarkable.  He was given his evening Keppra here in the ED with 500 mg IV Keppra.  I reviewed his prior medical records including his extensive psychiatric and neurological evaluations at outside hospitals.  This included an EEG during his noted episodes which did not correspond to correlate to true epileptic activity.  He is felt to have nonepileptic or psychogenic seizures, likely related to underlying psychiatric history.  In this case there is no role for new antiepileptic medications or neurology consultation.  I called and spoke to the [facility] at [facility] to provide signout and plan for transfer back to [facility].        Final Clinical Impression(s) / ED Diagnoses Final diagnoses:  Seizure-like activity [facility])    Rx / DC Orders ED Discharge Orders     None         Wiatt Mahabir, Kermit Balo, MD 05/31/23 2340

## 2023-05-31 NOTE — Progress Notes (Signed)
1900 pt requested this writer to come to room because stated "I feel a seizure coming on." Patient proceeded to seize until 1903. Patient was laid on side and airway was maintained. After the episode patient was not responding. Vital signs were stable. EMS was called 1913 due to unresponsiveness in post-ictal state. Patient assisted EMS with repositioning onto gurney. Patient transported to [facility] with [facility].

## 2023-05-31 NOTE — Group Note (Unsigned)
Date:  05/31/2023 Time:  11:22 AM  Group Topic/Focus:  Goals Group:   The focus of this group is to help patients establish daily goals to achieve during treatment and discuss how the patient can incorporate goal setting into their daily lives to aide in recovery.     Participation Level:  {BHH PARTICIPATION ZOXWR:60454}  Participation Quality:  {BHH PARTICIPATION QUALITY:22265}  Affect:  {BHH AFFECT:22266}  Cognitive:  {BHH COGNITIVE:22267}  Insight: {BHH Insight2:20797}  Engagement in Group:  {BHH ENGAGEMENT IN UJWJX:91478}  Modes of Intervention:  {BHH MODES OF INTERVENTION:22269}  Additional Comments:  ***  Reymundo Poll 05/31/2023, 11:22 AM

## 2023-05-31 NOTE — BH IP Treatment Plan (Signed)
Interdisciplinary Treatment and Diagnostic Plan Update  05/31/2023 Time of Session: 11:45AM - UPDATE Vincent Frank MRN: [id number]  Principal Diagnosis: MDD (major depressive disorder)  Secondary Diagnoses: Principal Problem:   MDD (major depressive disorder)   Current Medications:  Current Facility-Administered Medications  Medication Dose Route Frequency Provider Last Rate Last Admin   acetaminophen (TYLENOL) tablet 650 mg  650 mg Oral Q6H PRN Eligha Bridegroom, NP   650 mg at 05/26/23 2135   alum & mag hydroxide-simeth (MAALOX/MYLANTA) 200-200-20 MG/5ML suspension 30 mL  30 mL Oral Q4H PRN Eligha Bridegroom, NP       ARIPiprazole (ABILIFY) tablet 5 mg  5 mg Oral QHS Kizzie Ide B, MD   5 mg at 05/30/23 2137   diphenhydrAMINE (BENADRYL) capsule 50 mg  50 mg Oral TID PRN Eligha Bridegroom, NP       Or   diphenhydrAMINE (BENADRYL) injection 50 mg  50 mg Intramuscular TID PRN Eligha Bridegroom, NP       FLUoxetine (PROZAC) capsule 60 mg  60 mg Oral Daily Kizzie Ide B, MD   60 mg at 05/31/23 4098   hydrOXYzine (ATARAX) tablet 25 mg  25 mg Oral TID PRN Eligha Bridegroom, NP   25 mg at 05/26/23 2319   influenza vac split trivalent PF (FLULAVAL) injection 0.5 mL  0.5 mL Intramuscular Tomorrow-1000 Abbott Pao, Nadir, MD       levETIRAcetam (KEPPRA) tablet 500 mg  500 mg Oral BID Eligha Bridegroom, NP   500 mg at 05/31/23 1191   LORazepam (ATIVAN) tablet 2 mg  2 mg Oral TID PRN Eligha Bridegroom, NP       Or   LORazepam (ATIVAN) injection 2 mg  2 mg Intramuscular TID PRN Eligha Bridegroom, NP   2 mg at 05/25/23 2040   LORazepam (ATIVAN) tablet 2 mg  2 mg Per Tube Once Massengill, [name], MD       magnesium hydroxide (MILK OF MAGNESIA) suspension 30 mL  30 mL Oral Daily PRN Eligha Bridegroom, NP       polyethylene glycol (MIRALAX / GLYCOLAX) packet 17 g  17 g Oral BID Carrion-Carrero, Karle Starch, MD   17 g at 05/31/23 4782   senna-docusate (Senokot-S) tablet 1 tablet  1 tablet Oral BID  Lorri Frederick, MD   1 tablet at 05/31/23 9562   traZODone (DESYREL) tablet 50 mg  50 mg Oral QHS Carrion-Carrero, Karle Starch, MD   50 mg at 05/30/23 2137   ziprasidone (GEODON) injection 20 mg  20 mg Intramuscular TID PRN Lance Muss, MD       Or   ziprasidone (GEODON) capsule 20 mg  20 mg Oral TID PRN Lance Muss, MD       PTA Medications: Medications Prior to Admission  Medication Sig Dispense Refill Last Dose   acetaminophen (TYLENOL) 325 MG tablet Take 2 tablets (650 mg total) by mouth every 6 (six) hours as needed for mild pain (or Fever >/= 101). (Patient not taking: Reported on 05/25/2023)      ARIPiprazole (ABILIFY) 2 MG tablet Take 1 tablet (2 mg total) by mouth at bedtime. Antidepressant augmentation 30 tablet 0    fenofibrate (TRICOR) 145 MG tablet Take 145 mg by mouth daily.      ferrous sulfate 325 (65 FE) MG tablet Take 1 tablet (325 mg total) by mouth daily with breakfast. (Patient not taking: Reported on 05/25/2023)      FLUoxetine (PROZAC) 40 MG capsule Take 1 capsule (40 mg total) by mouth at  bedtime. For depression 30 capsule 0    hydrOXYzine (ATARAX) 25 MG tablet Take 1 tablet (25 mg total) by mouth every 6 (six) hours as needed for anxiety. (Patient not taking: Reported on 05/25/2023) 75 tablet 0    levETIRAcetam (KEPPRA) 500 MG tablet Take 500 mg by mouth 2 (two) times daily.      polyethylene glycol (MIRALAX / GLYCOLAX) 17 g packet Take 17 g by mouth daily as needed for mild constipation. (Patient not taking: Reported on 05/25/2023) 14 each 0    traZODone (DESYREL) 50 MG tablet Take 1 tablet by mouth daily as needed for sleep.       Patient Stressors:    Patient Strengths:    Treatment Modalities: Medication Management, Group therapy, Case management,  1 to 1 session with clinician, Psychoeducation, Recreational therapy.   Physician Treatment Plan for Primary Diagnosis: MDD (major depressive disorder) Long Term Goal(s):     Short Term Goals:  Ability to identify changes in lifestyle to reduce recurrence of condition will improve Ability to verbalize feelings will improve Ability to disclose and discuss suicidal ideas Ability to demonstrate self-control will improve Ability to identify and develop effective coping behaviors will improve Ability to maintain clinical measurements within normal limits will improve Compliance with prescribed medications will improve Ability to identify triggers associated with substance abuse/mental health issues will improve  Medication Management: Evaluate patient's response, side effects, and tolerance of medication regimen.  Therapeutic Interventions: 1 to 1 sessions, Unit Group sessions and Medication administration.  Evaluation of Outcomes: Progressing  Physician Treatment Plan for Secondary Diagnosis: Principal Problem:   MDD (major depressive disorder)  Long Term Goal(s):     Short Term Goals: Ability to identify changes in lifestyle to reduce recurrence of condition will improve Ability to verbalize feelings will improve Ability to disclose and discuss suicidal ideas Ability to demonstrate self-control will improve Ability to identify and develop effective coping behaviors will improve Ability to maintain clinical measurements within normal limits will improve Compliance with prescribed medications will improve Ability to identify triggers associated with substance abuse/mental health issues will improve     Medication Management: Evaluate patient's response, side effects, and tolerance of medication regimen.  Therapeutic Interventions: 1 to 1 sessions, Unit Group sessions and Medication administration.  Evaluation of Outcomes: Progressing   RN Treatment Plan for Primary Diagnosis: MDD (major depressive disorder) Long Term Goal(s): Knowledge of disease and therapeutic regimen to maintain health will improve  Short Term Goals: Ability to remain free from injury will improve,  Ability to verbalize frustration and anger appropriately will improve, Ability to participate in decision making will improve, Ability to verbalize feelings will improve, Ability to identify and develop effective coping behaviors will improve, and Compliance with prescribed medications will improve  Medication Management: RN will administer medications as ordered by provider, will assess and evaluate patient's response and provide education to patient for prescribed medication. RN will report any adverse and/or side effects to prescribing provider.  Therapeutic Interventions: 1 on 1 counseling sessions, Psychoeducation, Medication administration, Evaluate responses to treatment, Monitor vital signs and CBGs as ordered, Perform/monitor CIWA, COWS, AIMS and Fall Risk screenings as ordered, Perform wound care treatments as ordered.  Evaluation of Outcomes: Progressing   LCSW Treatment Plan for Primary Diagnosis: MDD (major depressive disorder) Long Term Goal(s): Safe transition to appropriate next level of care at discharge, Engage patient in therapeutic group addressing interpersonal concerns.  Short Term Goals: Engage patient in aftercare planning with referrals and resources, Increase  social support, Increase emotional regulation, Facilitate acceptance of mental health diagnosis and concerns, Identify triggers associated with mental health/substance abuse issues, and Increase skills for wellness and recovery  Therapeutic Interventions: Assess for all discharge needs, 1 to 1 time with Social worker, Explore available resources and support systems, Assess for adequacy in community support network, Educate family and significant other(s) on suicide prevention, Complete Psychosocial Assessment, Interpersonal group therapy.  Evaluation of Outcomes: Progressing   Progress in Treatment: Attending groups: No. Participating in groups: No. Taking medication as prescribed: Yes. Toleration medication:  Yes. Family/Significant other contact made:Yes, contacted: Vincent Frank (Mother) [phone]   Patient understands diagnosis: Yes. Discussing patient identified problems/goals with staff: Yes. Medical problems stabilized or resolved: Yes. Denies suicidal/homicidal ideation: Yes. Issues/concerns per patient self-inventory: No. Other: N/A   New problem(s) identified: No, Describe:  None reported   New Short Term/Long Term Goal(s):stabilization, elimination of SI thoughts, development of comprehensive mental wellness plan.    Patient Goals:  "I can't think of any right now"   Discharge Plan or Barriers: CSW will continue to follow and assess for appropriate referrals and possible discharge planning.    Reason for Continuation of Hospitalization: Anxiety Depression Medication stabilization Suicidal ideation Withdrawal symptoms   Estimated Length of Stay: 1-2 Days  Last 3 [place] Suicide Severity Risk Score: Flowsheet Row ED to Hosp-Admission (Current) [facility] from [facility]24/2023 in [facility] at [facility] ED from 05/23/2023 in [facility] Emergency Department at [facility]  C-SSRS RISK CATEGORY No Risk No Risk No Risk       Last PHQ 2/9 Scores:     No data to display          Scribe for Treatment Team: Kathi Der, LCSWA 05/31/2023 3:03 PM

## 2023-06-01 ENCOUNTER — Inpatient Hospital Stay (HOSPITAL_COMMUNITY)
Admission: EM | Admit: 2023-06-01 | Discharge: 2023-06-03 | DRG: 880 | Disposition: A | Discharge status: Other Acute Inpatient Hospital | Payer: BC Managed Care – PPO | Source: Intra-hospital | Attending: Internal Medicine | Admitting: Internal Medicine

## 2023-06-01 ENCOUNTER — Other Ambulatory Visit: Payer: Self-pay

## 2023-06-01 ENCOUNTER — Encounter (HOSPITAL_COMMUNITY): Payer: Self-pay

## 2023-06-01 ENCOUNTER — Encounter: Payer: Self-pay | Admitting: Internal Medicine

## 2023-06-01 ENCOUNTER — Observation Stay (HOSPITAL_COMMUNITY)
Admission: RE | Admit: 2023-06-01 | Payer: BC Managed Care – PPO | Source: Other Acute Inpatient Hospital | Admitting: Internal Medicine

## 2023-06-01 DIAGNOSIS — F332 Major depressive disorder, recurrent severe without psychotic features: Secondary | ICD-10-CM | POA: Diagnosis not present

## 2023-06-01 DIAGNOSIS — R064 Hyperventilation: Secondary | ICD-10-CM | POA: Diagnosis not present

## 2023-06-01 DIAGNOSIS — K5900 Constipation, unspecified: Secondary | ICD-10-CM | POA: Diagnosis present

## 2023-06-01 DIAGNOSIS — F445 Conversion disorder with seizures or convulsions: Principal | ICD-10-CM | POA: Diagnosis present

## 2023-06-01 DIAGNOSIS — R569 Unspecified convulsions: Secondary | ICD-10-CM

## 2023-06-01 DIAGNOSIS — Z888 Allergy status to other drugs, medicaments and biological substances status: Secondary | ICD-10-CM | POA: Diagnosis not present

## 2023-06-01 DIAGNOSIS — R339 Retention of urine, unspecified: Secondary | ICD-10-CM | POA: Diagnosis present

## 2023-06-01 DIAGNOSIS — F339 Major depressive disorder, recurrent, unspecified: Secondary | ICD-10-CM | POA: Diagnosis not present

## 2023-06-01 DIAGNOSIS — Z91030 Bee allergy status: Secondary | ICD-10-CM | POA: Diagnosis not present

## 2023-06-01 DIAGNOSIS — Z9151 Personal history of suicidal behavior: Secondary | ICD-10-CM | POA: Diagnosis not present

## 2023-06-01 DIAGNOSIS — G40909 Epilepsy, unspecified, not intractable, without status epilepticus: Secondary | ICD-10-CM | POA: Diagnosis present

## 2023-06-01 DIAGNOSIS — Z885 Allergy status to narcotic agent status: Secondary | ICD-10-CM

## 2023-06-01 DIAGNOSIS — Z9181 History of falling: Secondary | ICD-10-CM

## 2023-06-01 LAB — CBC WITH DIFFERENTIAL/PLATELET
Abs Immature Granulocytes: 0.03 10*3/uL (ref 0.00–0.07)
Basophils Absolute: 0 10*3/uL (ref 0.0–0.1)
Basophils Relative: 1 %
Eosinophils Absolute: 0.2 10*3/uL (ref 0.0–0.5)
Eosinophils Relative: 2 %
HCT: 39.3 % (ref 39.0–52.0)
Hemoglobin: 12.8 g/dL — ABNORMAL LOW (ref 13.0–17.0)
Immature Granulocytes: 0 %
Lymphocytes Relative: 14 %
Lymphs Abs: 1.2 10*3/uL (ref 0.7–4.0)
MCH: 25.8 pg — ABNORMAL LOW (ref 26.0–34.0)
MCHC: 32.6 g/dL (ref 30.0–36.0)
MCV: 79.1 fL — ABNORMAL LOW (ref 80.0–100.0)
Monocytes Absolute: 0.6 10*3/uL (ref 0.1–1.0)
Monocytes Relative: 7 %
Neutro Abs: 6.4 10*3/uL (ref 1.7–7.7)
Neutrophils Relative %: 76 %
Platelets: 263 10*3/uL (ref 150–400)
RBC: 4.97 MIL/uL (ref 4.22–5.81)
RDW: 12.6 % (ref 11.5–15.5)
WBC: 8.4 10*3/uL (ref 4.0–10.5)
nRBC: 0 % (ref 0.0–0.2)

## 2023-06-01 LAB — BASIC METABOLIC PANEL
Anion gap: 9 (ref 5–15)
BUN: 19 mg/dL (ref 6–20)
CO2: 23 mmol/L (ref 22–32)
Calcium: 9.5 mg/dL (ref 8.9–10.3)
Chloride: 106 mmol/L (ref 98–111)
Creatinine, Ser: 1.38 mg/dL — ABNORMAL HIGH (ref 0.61–1.24)
GFR, Estimated: >60 mL/min (ref >60)
Glucose, Bld: 93 mg/dL (ref 70–99)
Potassium: 3.6 mmol/L (ref 3.5–5.1)
Sodium: 138 mmol/L (ref 135–145)

## 2023-06-01 MED ORDER — ALUM & MAG HYDROXIDE-SIMETH 200-200-20 MG/5ML PO SUSP: 30.0000 mL | ORAL | Status: DC | PRN

## 2023-06-01 MED ORDER — ONDANSETRON HCL 4 MG/2ML IJ SOLN: 4.0000 mg | Freq: Four times a day (QID) | INTRAMUSCULAR | Status: DC | PRN

## 2023-06-01 MED ORDER — LEVETIRACETAM 500 MG PO TABS
500.0000 mg | ORAL_TABLET | Freq: Two times a day (BID) | ORAL | Status: DC
  Administered 2023-06-01 – 2023-06-03 (×4): 500 mg via ORAL
  Filled 2023-06-01 (×4): qty 1

## 2023-06-01 MED ORDER — ENOXAPARIN SODIUM 40 MG/0.4ML IJ SOSY: 40.0000 mg | PREFILLED_SYRINGE | INTRAMUSCULAR | Status: DC

## 2023-06-01 MED ORDER — SENNOSIDES-DOCUSATE SODIUM 8.6-50 MG PO TABS: 1.0000 | ORAL_TABLET | Freq: Every evening | ORAL | Status: DC | PRN

## 2023-06-01 MED ORDER — DOCUSATE SODIUM 100 MG PO CAPS
100.0000 mg | ORAL_CAPSULE | Freq: Two times a day (BID) | ORAL | Status: DC
  Administered 2023-06-01 – 2023-06-02 (×2): 100 mg via ORAL
  Filled 2023-06-01 (×2): qty 1

## 2023-06-01 MED ORDER — IPRATROPIUM-ALBUTEROL 0.5-2.5 (3) MG/3ML IN SOLN: 3.0000 mL | RESPIRATORY_TRACT | Status: DC | PRN

## 2023-06-01 MED ORDER — ACETAMINOPHEN 650 MG RE SUPP: 650.0000 mg | Freq: Four times a day (QID) | RECTAL | Status: DC | PRN

## 2023-06-01 MED ORDER — LEVETIRACETAM 500 MG PO TABS
500.0000 mg | ORAL_TABLET | Freq: Two times a day (BID) | ORAL | Status: DC
  Filled 2023-06-01 (×2): qty 1

## 2023-06-01 MED ORDER — ARIPIPRAZOLE 5 MG PO TABS: 5.0000 mg | ORAL_TABLET | Freq: Every day | ORAL | Status: DC

## 2023-06-01 MED ORDER — ENOXAPARIN SODIUM 40 MG/0.4ML IJ SOSY
40.0000 mg | PREFILLED_SYRINGE | Freq: Every day | INTRAMUSCULAR | Status: DC
  Administered 2023-06-02: 40 mg via SUBCUTANEOUS
  Filled 2023-06-01: qty 0.4

## 2023-06-01 MED ORDER — ARIPIPRAZOLE 5 MG PO TABS
5.0000 mg | ORAL_TABLET | Freq: Every day | ORAL | Status: DC
  Administered 2023-06-02 (×2): 5 mg via ORAL
  Filled 2023-06-01 (×3): qty 1

## 2023-06-01 MED ORDER — HYDROXYZINE HCL 25 MG PO TABS: 25.0000 mg | ORAL_TABLET | Freq: Three times a day (TID) | ORAL | Status: DC | PRN

## 2023-06-01 MED ORDER — LORAZEPAM 2 MG/ML IJ SOLN: 2.0000 mg | Freq: Once | INTRAMUSCULAR | Status: DC | PRN

## 2023-06-01 MED ORDER — HYDROXYZINE HCL 25 MG PO TABS
25.0000 mg | ORAL_TABLET | Freq: Three times a day (TID) | ORAL | Status: DC | PRN
  Administered 2023-06-02: 25 mg via ORAL
  Filled 2023-06-01: qty 1

## 2023-06-01 MED ORDER — ENOXAPARIN SODIUM 60 MG/0.6ML IJ SOSY
50.0000 mg | PREFILLED_SYRINGE | Freq: Every day | INTRAMUSCULAR | Status: DC
  Administered 2023-06-01: 50 mg via SUBCUTANEOUS
  Filled 2023-06-01: qty 0.6

## 2023-06-01 MED ORDER — TRAZODONE HCL 50 MG PO TABS: 50.0000 mg | ORAL_TABLET | Freq: Every day | ORAL | Status: DC

## 2023-06-01 MED ORDER — TRAZODONE HCL 50 MG PO TABS
50.0000 mg | ORAL_TABLET | Freq: Every day | ORAL | Status: DC
  Administered 2023-06-01 – 2023-06-02 (×2): 50 mg via ORAL
  Filled 2023-06-01 (×2): qty 1

## 2023-06-01 MED ORDER — HYDRALAZINE HCL 20 MG/ML IJ SOLN: 10.0000 mg | INTRAMUSCULAR | Status: DC | PRN

## 2023-06-01 MED ORDER — VITAMIN D 25 MCG (1000 UNIT) PO TABS
2000.0000 [IU] | ORAL_TABLET | Freq: Every day | ORAL | Status: DC
  Administered 2023-06-01 – 2023-06-03 (×3): 2000 [IU] via ORAL
  Filled 2023-06-01 (×3): qty 2

## 2023-06-01 MED ORDER — LEVETIRACETAM 500 MG PO TABS: 500.0000 mg | ORAL_TABLET | Freq: Two times a day (BID) | ORAL | Status: DC

## 2023-06-01 MED ORDER — FLUOXETINE HCL 20 MG PO CAPS
60.0000 mg | ORAL_CAPSULE | Freq: Every day | ORAL | Status: DC
Start: 2023-06-01 — End: 2023-06-03
  Administered 2023-06-01 – 2023-06-03 (×3): 60 mg via ORAL
  Filled 2023-06-01 (×3): qty 3

## 2023-06-01 MED ORDER — POLYETHYLENE GLYCOL 3350 17 G PO PACK
17.0000 g | PACK | Freq: Two times a day (BID) | ORAL | Status: DC
Start: 2023-06-01 — End: 2023-06-03
  Filled 2023-06-01: qty 1

## 2023-06-01 MED ORDER — METOPROLOL TARTRATE 5 MG/5ML IV SOLN: 5.0000 mg | INTRAVENOUS | Status: DC | PRN

## 2023-06-01 MED ORDER — ACETAMINOPHEN 325 MG PO TABS: 650.0000 mg | ORAL_TABLET | Freq: Four times a day (QID) | ORAL | Status: DC | PRN

## 2023-06-01 MED ORDER — FLUOXETINE HCL 20 MG PO CAPS: 60.0000 mg | ORAL_CAPSULE | Freq: Every day | ORAL | Status: DC

## 2023-06-01 NOTE — Progress Notes (Addendum)
Patient arrived back to [facility] to room 307-1. Patient medically cleared at [facility].  CT scan normal. No distress noted. Patient is ambulatory. Walked back to his room. Continues 1:1 for safety. Patient remains safe and is sleeping at this time.

## 2023-06-01 NOTE — Progress Notes (Signed)
Pt had pseudoseizure 1103-1108 shaking, no incontinence, vomiting, or tongue biting. MD notified and present.   Pt began shaking again 1113-1116 no incontinence, vomiting, or tongue biting. MD present again.   Pt is currently laying in the bed within arm's reach of the sitter.

## 2023-06-01 NOTE — Progress Notes (Signed)
Patient arrived to the unit via stretcher transport, pt appears calm, only complaint is mild dizziness. No other complaints

## 2023-06-01 NOTE — Progress Notes (Signed)
[facility] MD Progress Note  06/01/2023 12:34 PM Vincent Frank  MRN:  [id number]  Subjective:    Vincent Frank is a 22 y.o. male with a history of MDD, who was initially admitted for inpatient psychiatric hospitalization on 05/25/2023 under IVC for management of concern with SI attempt with taking 25 pills of Benadryl.    Yesterday the psychiatry team made the following recommendations: -Continue Abilify 5 mg nightly (home dose was 2 mg)  -Continue Prozac 60 mg daily (home dose was 40 mg)  -Continue trazodone 50 mg at bedtime as needed for insomnia  -Continue Atarax 25 mg TID as needed for anxiety  Overnight, the patient had seizure-like activity sent to the ER for evaluation, per provider note emergency department: Vincent Frank is a 22 y.o. male presenting from behavioral health hospital with concern for seizure-like activity.  Patient reportedly had an episode of generalized shaking and seizure type activity.  He was being transported to the hospital and another episode by EMS was given Versed by EMS.  Patient says that he feels "lousy" and has a headache on arrival.   I reviewed his external records and he has been extensively worked up for seizures, and carries a diagnosis of psychogenic nonepileptic seizures.  He has intermittently been on and off Keppra.  Currently the patient is on Keppra and taking it twice daily.  However he has had nondescript EEGs, and CT head as recently at 05/18/23 at [facility] (no acute findings).   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 Has been managed on Keppra 500 mg twice daily. Pt reiterates he has been taking most of the time. Presented to the ED with concerns of recurrent seizures at home but was unwitnessed. Lives with  his parents. He did have 2 witnessed seizures back-to-back in the ER. CT head was negative for any hemorrhage or obvious infarct. Transferred to [facility] for neurology evaluation. Neurology evaluated and captured event on EEG that was non-epileptiform. Suspect psychogenic and pt has a VERY complicated psychiatric hx. Neurology stopped keppra. Neurology signed off with no further recommendations.."   ED assessment: Patient presented to ED after reported seizure-like activity occurring at the behavioral health hospital as well as for the paramedics.  Patient is awake with a GCS of 15 on arrival, drowsy, complaining of a headache.  CT of the head was performed and personally reviewed with no emergent findings.  Patient's vital signs are otherwise unremarkable.  He was given his evening Keppra here in the ED with 500 mg IV Keppra.   I reviewed his prior medical records including his extensive psychiatric and neurological evaluations at outside hospitals.  This included an EEG during his noted episodes which did not correspond to correlate to true epileptic activity.  He is felt to have nonepileptic or psychogenic seizures, likely related to underlying psychiatric history.  In this case there is no role for new antiepileptic medications or neurology consultation.   I called and spoke to the [facility] to provide signout and plan for transfer back to [facility].  I assessed the patient twice this morning, due to medical emergency event: On my initial evaluation, the patient was lying in bed, not responding to verbal stimuli.  Per nursing, patient had a 5-minute seizure with prominent shaking of his right upper extremity, no tongue  biting or urinary incontinence.  Vitals were stable.  About 10 minutes later, the patient apparently had another seizure-like episode, with shaking of the entire body, not verbally responsive, no tongue biting or incontinence.  Later around 1215 or so, the patient had another seizure-like episode.   It was decided there after to send the patient back to the emergency department for further evaluation.  He has a documented history of pseudoseizures, and is already on Keppra.  He did not take any of his medications including Keppra this morning.  He did receive IV Keppra last night.  The patient's seizure-like presentation, he was unable to give any subjective information regarding his psychiatric symptoms including mood, anxiety level, sleep, appetite, suicidal thoughts, homicidal thoughts, or any psychotic symptoms.  Nor was he able to provide any information regarding medications or side effects to medications.    Principal Problem: MDD (major depressive disorder) Diagnosis: Principal Problem:   MDD (major depressive disorder) Active Problems:   Psychogenic nonepileptic seizure  Total Time spent with patient: 20 minutes  Past Psychiatric History:  Previous Psych Diagnoses: MDD Prior psychiatric treatment: did not answer Psychiatric medication compliance history: compliant   Current psychiatric treatment: abilify, prozac, trazodone Current psychiatrist: most recently 2 weeks ago Current therapist: most recently weeks ago    Previous hospitalizations twice- unsure when History of suicide attempts: per chart review once 2 years ago attempting to jump off roof (currently denies) History of self harm: most recently 2.5 months ago (reports over a year ago-cutting to release stress)     Past Medical History:  Past Medical History:  Diagnosis Date   Major depressive disorder    Seizures (HCC)     Past Surgical History:  Procedure Laterality Date   NO PAST SURGERIES     Family History:  Family History  Problem Relation Age of Onset   Healthy Mother    Family Psychiatric  History: See H&P  Social History:  Social History   Substance and Sexual Activity  Alcohol Use Never     Social History   Substance and Sexual Activity  Drug Use Never    Social History    Socioeconomic History   Marital status: Single    Spouse name: Not on file   Number of children: Not on file   Years of education: Not on file   Highest education level: Not on file  Occupational History   Not on file  Tobacco Use   Smoking status: Never   Smokeless tobacco: Never  Vaping Use   Vaping status: Never Used  Substance and Sexual Activity   Alcohol use: Never   Drug use: Never   Sexual activity: Not Currently  Other Topics Concern   Not on file  Social History Narrative   ** Merged History Encounter **       Social Determinants of Health   Financial Resource Strain: Low Risk  (05/07/2023)   Received from [facility]   Overall Financial Resource Strain (CARDIA)    Difficulty of Paying Living Expenses: Not very hard  Food Insecurity: No Food Insecurity (05/25/2023)   Hunger Vital Sign    Worried About Running Out of Food in the Last Year: Never true    Ran Out of Food in the Last Year: Never true  Transportation Needs: Unknown (05/25/2023)   PRAPARE - Administrator, Civil Service (Medical): Patient declined    Lack of Transportation (Non-Medical): No  Recent Concern: Transportation Needs - Unmet  Transportation Needs (05/07/2023)   Received from [facility] - Transportation    In the past 12 months, has lack of transportation kept you from medical appointments or from getting medications?: No    Lack of Transportation (Non-Medical): Yes  Physical Activity: Not on file  Stress: Not on file  Social Connections: Unknown (01/16/2023)   Received from [facility]   Social Network    Social Network: Not on file   Additional Social History:                           Current Medications: Current Facility-Administered Medications  Medication Dose Route Frequency Provider Last Rate Last Admin   acetaminophen (TYLENOL) tablet 650 mg  650 mg Oral Q6H PRN Eligha Bridegroom, NP   650 mg at  05/26/23 2135   alum & mag hydroxide-simeth (MAALOX/MYLANTA) 200-200-20 MG/5ML suspension 30 mL  30 mL Oral Q4H PRN Eligha Bridegroom, NP       ARIPiprazole (ABILIFY) tablet 5 mg  5 mg Oral QHS Kizzie Ide B, MD   5 mg at 05/31/23 2342   diphenhydrAMINE (BENADRYL) capsule 50 mg  50 mg Oral TID PRN Eligha Bridegroom, NP       Or   diphenhydrAMINE (BENADRYL) injection 50 mg  50 mg Intramuscular TID PRN Eligha Bridegroom, NP       FLUoxetine (PROZAC) capsule 60 mg  60 mg Oral Daily Kizzie Ide B, MD   60 mg at 05/31/23 2130   hydrOXYzine (ATARAX) tablet 25 mg  25 mg Oral TID PRN Eligha Bridegroom, NP   25 mg at 05/26/23 2319   influenza vac split trivalent PF (FLULAVAL) injection 0.5 mL  0.5 mL Intramuscular Tomorrow-1000 Abbott Pao, Nadir, MD       levETIRAcetam (KEPPRA) tablet 500 mg  500 mg Oral Q12H Green, Terri L, RPH       LORazepam (ATIVAN) tablet 2 mg  2 mg Oral TID PRN Eligha Bridegroom, NP       Or   LORazepam (ATIVAN) injection 2 mg  2 mg Intramuscular TID PRN Eligha Bridegroom, NP   2 mg at 05/25/23 2040   LORazepam (ATIVAN) tablet 2 mg  2 mg Per Tube Once Mamye Bolds, [name], MD       magnesium hydroxide (MILK OF MAGNESIA) suspension 30 mL  30 mL Oral Daily PRN Eligha Bridegroom, NP       polyethylene glycol (MIRALAX / GLYCOLAX) packet 17 g  17 g Oral BID Carrion-Carrero, Margely, MD   17 g at 05/31/23 1655   traZODone (DESYREL) tablet 50 mg  50 mg Oral QHS Carrion-Carrero, Margely, MD   50 mg at 05/31/23 2342   vitamin D3 (CHOLECALCIFEROL) tablet 2,000 Units  2,000 Units Oral Daily Golda Acre, MD   2,000 Units at 05/31/23 1838   ziprasidone (GEODON) injection 20 mg  20 mg Intramuscular TID PRN Lance Muss, MD       Or   ziprasidone (GEODON) capsule 20 mg  20 mg Oral TID PRN Lance Muss, MD        Lab Results:  Results for orders placed or performed during the hospital encounter of 05/25/23 (from the past 48 hour(s))  Folate     Status: None   Collection Time: 05/30/23   6:24 PM  Result Value Ref Range   Folate 6.4 >5.9 ng/mL    Comment: Performed at [facility], [address]  [place], [place] [address]  Vitamin B12     Status: None   Collection Time: 05/30/23  6:24 PM  Result Value Ref Range   Vitamin B-12 294 180 - 914 pg/mL    Comment: (NOTE) This assay is not validated for testing neonatal or myeloproliferative syndrome specimens for Vitamin B12 levels. Performed at [facility], [address], [place], [place] [address]   VITAMIN D 25 Hydroxy (Vit-D Deficiency, Fractures)     Status: Abnormal   Collection Time: 05/30/23  6:24 PM  Result Value Ref Range   Vit D, 25-Hydroxy 29.20 (L) 30 - 100 ng/mL    Comment: (NOTE) Vitamin D deficiency has been defined by the Institute of Medicine  and an Endocrine Society practice guideline as a level of serum 25-OH  vitamin D less than 20 ng/mL (1,2). The Endocrine Society went on to  further define vitamin D insufficiency as a level between 21 and 29  ng/mL (2).  1. IOM (Institute of Medicine). 2010. Dietary reference intakes for  calcium and D. [place]: [employer]. 2. Holick MF, Binkley [place], Bischoff-Ferrari HA, et al. Evaluation,  treatment, and prevention of vitamin D deficiency: an Endocrine  Society clinical practice guideline, JCEM. 2011 Jul; 96(7): 1911-30.  Performed at [facility], [address], [place], [place] [address]   RPR     Status: None   Collection Time: 05/30/23  6:24 PM  Result Value Ref Range   RPR Ser Ql NON REACTIVE NON REACTIVE    Comment: Performed at [facility], [address] [place], [place] [address]    Blood Alcohol level:  Lab Results  Component Value Date   [facility] <10 05/24/2023   ETH <10 05/23/2023    Metabolic Disorder Labs: Lab Results  Component Value Date   HGBA1C 5.2 03/03/2023   MPG 102.54 03/03/2023   MPG 96.8 07/07/2021   No results found for: "PROLACTIN" Lab Results   Component Value Date   CHOL 217 (H) 03/03/2023   TRIG 82 03/03/2023   HDL 39 (L) 03/03/2023   CHOLHDL 5.6 03/03/2023   VLDL 16 03/03/2023   LDLCALC 162 (H) 03/03/2023   LDLCALC 138 (H) 07/07/2021    Physical Findings: AIMS:  , ,  ,  ,    CIWA:    COWS:       Psychiatric Specialty Exam:  Presentation  General Appearance:  -- (Laying in bed, not verbally responsive, shaking)  Eye Contact: None  Speech: -- (Not verbally responsive)  Speech Volume: Other (comment)  Handedness: Right   Mood and Affect  Mood: -- (Did not comment on)  Affect: -- (Patient having seizure-like activity)   Thought Process  Thought Processes: -- (Could not assess)  Descriptions of Associations:-- (Could not assess)  Orientation:None  Thought Content:-- (Could not assess)  History of Schizophrenia/Schizoaffective disorder:No  Duration of Psychotic Symptoms:No data recorded Hallucinations:Hallucinations: None  Ideas of Reference:None  Suicidal Thoughts:Suicidal Thoughts: -- (Could not assess)  Homicidal Thoughts:Homicidal Thoughts: -- (Could not assess)   Sensorium  Memory: Other (comment)  Judgment: Other (comment)  Insight: Other (comment)   Executive Functions  Concentration: Other (comment)  Attention Span: Other (comment)  Recall: Good  Fund of Knowledge: Good  Language: Good   Psychomotor Activity  Psychomotor Activity: Psychomotor Activity: -- (Having seizure like activity)   Assets  Assets: Communication Skills; Desire for Improvement; Social Support   Sleep  Sleep: Sleep: -- (Could not assess)  Physical Exam: Physical Exam Neurological:     Comments: Having seizure-like activity    Review of Systems  Neurological:        Having seizure-like activity   Blood pressure 93/74, pulse (!) 109, temperature 98.1 F (36.7 C), resp. rate 18, height 6\' 3"  (1.905 m), weight 107.5 kg, SpO2 99%. Body mass index is 29.62  kg/m.   Treatment Plan Summary: Daily contact with patient to assess and evaluate symptoms and progress in treatment and Medication management   ASSESSMENT:   Diagnoses / Active Problems: Principal Problem: MDD (major depressive disorder) Diagnosis: Principal Problem:   MDD (major depressive disorder)     Vincent Frank is a 22 y.o., male with a past psychiatric history significant for MDD and seizure-like activity who [facility] to the [facility] from [facility] Emergency Department for evaluation and management of suicide attempt of overdosing on Benadryl.   On Tuesday, 11/5, patient was sent back to the emergency department for 3 episodes of seizure-like activity   PLAN: Safety and Monitoring:             -- INVOLUNTARY admission to inpatient psychiatric unit for safety, stabilization and treatment             -- Daily contact with patient to assess and evaluate symptoms and progress in treatment             -- Patient's case to be discussed in multi-disciplinary team meeting             -- Observation Level : q15 minute checks             -- Vital signs:  q12 hours             -- Precautions: suicide, elopement, and assault   2. Medications:               Psychiatric Diagnosis and Treatment MDD, recurrent, severe -Continue Abilify 5 mg nightly (home dose was 2 mg)  -Continue Prozac 60 mg daily (home dose was 40 mg)  -Continue trazodone 50 mg at bedtime as needed for insomnia  -Continue Atarax 25 mg TID as needed for anxiety Agitation Protocol: Ativan/Benadryl/Geodon   Medical Diagnosis and Treatment  Reported seizures -continue Keppra 500 BID    Constipation - unresolved -MiraLAX, Senokot Colace   Other as needed medications  Tylenol 650 mg every 6 hours as needed for pain Mylanta 30 mL every 4 hours as needed for indigestion Milk of magnesia 30 mL daily as needed for constipation   The risks/benefits/side-effects/alternatives  to the above medication were discussed in detail with the patient and time was given for questions. The patient consents to medication trial. FDA black box warnings, if present, were discussed.   The patient is agreeable with the medication plan, as above. We will monitor the patient's response to pharmacologic treatment, and adjust medications as necessary.   3. Routine and other pertinent labs: EKG monitoring: QTc: 457 on 10/29   Metabolism / endocrine: BMI: Body mass index is 29.62 kg/m.   CBC: hgb 9.4 CMP: Cr 1.35 UDS: positive benzo Ethanol: <10 UA: small leuks   4. Group Therapy:             -- Encouraged patient to participate in unit milieu and in scheduled group therapies   -- Patient is encouraged to participate in group therapy while admitted to the psychiatric unit. -- We will address other chronic and acute stressors, which contributed to  the patient's MDD (major depressive disorder) in order to reduce the risk of self-harm at discharge.   4. Discharge Planning:              -- Social work and case management to assist with discharge planning and identification of hospital follow-up needs prior to discharge             -- Estimated LOS: Likely Tues-Wed             -- Discharge Concerns: Need to establish a safety plan; Medication compliance and effectiveness             -- Discharge Goals: Return home with outpatient referrals for mental health follow-up including medication management/psychotherapy    Phineas Inches, MD 06/01/2023, 12:34 PM   Total Time Spent in Direct Patient Care:  I personally spent 35 minutes on the unit in direct patient care. The direct patient care time included face-to-face time with the patient, reviewing the patient's chart, communicating with other professionals, and coordinating care. Greater than 50% of this time was spent in counseling or coordinating care with the patient regarding goals of hospitalization, psycho-education, and  discharge planning needs.   Phineas Inches, MD Psychiatrist

## 2023-06-01 NOTE — ED Provider Notes (Signed)
[place] EMERGENCY DEPARTMENT AT [facility] Provider Note   CSN: [id number] Arrival date & time: 05/25/23  2141     History  Chief Complaint  Patient presents with   Seizures    Vincent Frank is a 22 y.o. male.  22 year old male with prior medical history as detailed below presents from [facility] for evaluation of possible seizure.  Patient is currently at [facility] on IVC hold after overdose on Benadryl. Patient with documented history of nonepileptic seizure activity.  Per chart review patient was seen last night at this facility for same issues.  Patient has continued shaking episodes while at [facility].  Staff at [facility] concern for possible seizure.  Patient was given 2 mg of Ativan IM just prior to transport to the ED this afternoon.  On arrival at the ED the patient is sleepy but calm and directable.     The history is provided by the patient and medical records.       Home Medications Prior to Admission medications   Medication Sig Start Date End Date Taking? Authorizing Provider  acetaminophen (TYLENOL) 325 MG tablet Take 2 tablets (650 mg total) by mouth every 6 (six) hours as needed for mild pain (or Fever >/= 101). Patient not taking: Reported on 05/25/2023 04/12/23   Alford Highland, MD  ARIPiprazole (ABILIFY) 2 MG tablet Take 1 tablet (2 mg total) by mouth at bedtime. Antidepressant augmentation 03/20/23   Armandina Stammer I, NP  fenofibrate (TRICOR) 145 MG tablet Take 145 mg by mouth daily. 04/30/23   [provider]  ferrous sulfate 325 (65 FE) MG tablet Take 1 tablet (325 mg total) by mouth daily with breakfast. Patient not taking: Reported on 05/25/2023 04/13/23   Alford Highland, MD  FLUoxetine (PROZAC) 40 MG capsule Take 1 capsule (40 mg total) by mouth at bedtime. For depression 03/20/23   Armandina Stammer I, NP  hydrOXYzine (ATARAX) 25 MG tablet Take 1 tablet (25 mg total) by mouth every 6 (six) hours as needed for anxiety. Patient not taking: Reported on  05/25/2023 03/20/23   Armandina Stammer I, NP  levETIRAcetam (KEPPRA) 500 MG tablet Take 500 mg by mouth 2 (two) times daily. 05/04/23 05/03/24  [provider]  polyethylene glycol (MIRALAX / GLYCOLAX) 17 g packet Take 17 g by mouth daily as needed for mild constipation. Patient not taking: Reported on 05/25/2023 04/12/23   Alford Highland, MD  traZODone (DESYREL) 50 MG tablet Take 1 tablet by mouth daily as needed for sleep. 05/04/23 05/03/24  [provider]      Allergies    Bee venom, Haldol [haloperidol], Hornet venom, and Gabapentin    Review of Systems   Review of Systems  All other systems reviewed and are negative.   Physical Exam Updated Vital Signs BP (!) 108/52   Pulse 65   Temp 98.1 F (36.7 C)   Resp 18   Ht 6\' 3"  (1.905 m)   Wt 107.5 kg   SpO2 97%   BMI 29.62 kg/m  Physical Exam Vitals and nursing note reviewed.  Constitutional:      General: He is not in acute distress.    Appearance: Normal appearance. He is well-developed.  HENT:     Head: Normocephalic and atraumatic.  Eyes:     Conjunctiva/sclera: Conjunctivae normal.     Pupils: Pupils are equal, round, and reactive to light.  Cardiovascular:     Rate and Rhythm: Normal rate and regular rhythm.     Heart  sounds: Normal heart sounds.  Pulmonary:     Effort: Pulmonary effort is normal. No respiratory distress.     Breath sounds: Normal breath sounds.  Abdominal:     General: There is no distension.     Palpations: Abdomen is soft.     Tenderness: There is no abdominal tenderness.  Musculoskeletal:        General: No deformity. Normal range of motion.     Cervical back: Normal range of motion and neck supple.  Skin:    General: Skin is warm and dry.  Neurological:     General: No focal deficit present.     Mental Status: He is alert and oriented to person, place, and time.     ED Results / Procedures / Treatments   Labs (all labs ordered are listed, but only abnormal results are  displayed) Labs Reviewed  CBC WITH DIFFERENTIAL/PLATELET - Abnormal; Notable for the following components:      Result Value   RBC 3.68 (*)    Hemoglobin 9.4 (*)    HCT 29.8 (*)    MCH 25.5 (*)    All other components within normal limits  BASIC METABOLIC PANEL - Abnormal; Notable for the following components:   CO2 21 (*)    Creatinine, Ser 1.36 (*)    Calcium 8.8 (*)    All other components within normal limits  VITAMIN D 25 HYDROXY (VIT D DEFICIENCY, FRACTURES) - Abnormal; Notable for the following components:   Vit D, 25-Hydroxy 29.20 (*)    All other components within normal limits  CBC WITH DIFFERENTIAL/PLATELET - Abnormal; Notable for the following components:   Hemoglobin 12.8 (*)    MCV 79.1 (*)    MCH 25.8 (*)    All other components within normal limits  FOLATE  VITAMIN B12  RPR  BASIC METABOLIC PANEL  RAPID URINE DRUG SCREEN, HOSP PERFORMED  CBG MONITORING, ED  I-STAT CG4 LACTIC ACID, ED    EKG None  Radiology CT Head Wo Contrast  Result Date: 05/31/2023 CLINICAL DATA:  Altered mental status EXAM: CT HEAD WITHOUT CONTRAST TECHNIQUE: Contiguous axial images were obtained from the base of the skull through the vertex without intravenous contrast. RADIATION DOSE REDUCTION: This exam was performed according to the departmental dose-optimization program which includes automated exposure control, adjustment of the mA and/or kV according to patient size and/or use of iterative reconstruction technique. COMPARISON:  05/04/2023 FINDINGS: Brain: No evidence of acute infarction, hemorrhage, hydrocephalus, extra-axial collection or mass lesion/mass effect. Vascular: No hyperdense vessel or unexpected calcification. Skull: Normal. Negative for fracture or focal lesion. Sinuses/Orbits: The visualized paranasal sinuses are essentially clear. The mastoid air cells are unopacified. Other: None. IMPRESSION: Normal head CT. Electronically Signed   By: Charline Bills M.D.   On:  05/31/2023 22:54    Procedures Procedures    Medications Ordered in ED Medications  acetaminophen (TYLENOL) tablet 650 mg (650 mg Oral Given 05/26/23 2135)  alum & mag hydroxide-simeth (MAALOX/MYLANTA) 200-200-20 MG/5ML suspension 30 mL (has no administration in time range)  magnesium hydroxide (MILK OF MAGNESIA) suspension 30 mL (has no administration in time range)  LORazepam (ATIVAN) tablet 2 mg ( Oral See Alternative 05/25/23 2045)    Or  LORazepam (ATIVAN) injection 2 mg (2 mg Intramuscular Not Given 05/25/23 2045)  diphenhydrAMINE (BENADRYL) capsule 50 mg (has no administration in time range)    Or  diphenhydrAMINE (BENADRYL) injection 50 mg (has no administration in time range)  hydrOXYzine (ATARAX) tablet 25  mg (25 mg Oral Given 05/26/23 2319)  LORazepam (ATIVAN) tablet 2 mg (2 mg Per Tube Not Given 05/25/23 2232)  ammonia inhalant (  Not Given 05/26/23 0538)  ARIPiprazole (ABILIFY) tablet 5 mg (5 mg Oral Given 05/31/23 2342)  FLUoxetine (PROZAC) capsule 60 mg (60 mg Oral Given 05/31/23 0822)  ziprasidone (GEODON) injection 20 mg (has no administration in time range)    Or  ziprasidone (GEODON) capsule 20 mg (has no administration in time range)  polyethylene glycol (MIRALAX / GLYCOLAX) packet 17 g (17 g Oral Given 05/31/23 1655)  traZODone (DESYREL) tablet 50 mg (50 mg Oral Given by Other 05/31/23 2342)  influenza vac split trivalent PF (FLULAVAL) injection 0.5 mL (0.5 mLs Intramuscular Patient Refused/Not Given 05/31/23 1132)  cholecalciferol (VITAMIN D3) 25 MCG (1000 UNIT) tablet 2,000 Units (2,000 Units Oral Given 05/31/23 1838)  levETIRAcetam (KEPPRA) tablet 500 mg (has no administration in time range)  magnesium citrate solution 1 Bottle (1 Bottle Oral Given 05/29/23 1228)  senna-docusate (Senokot-S) tablet 1 tablet (1 tablet Oral Given 05/31/23 1655)  levETIRAcetam (KEPPRA) IVPB 500 mg/100 mL premix (0 mg Intravenous Stopped 06/01/23 0008)  ketorolac (TORADOL) 30 MG/ML  injection 30 mg (30 mg Intravenous Given 05/31/23 2345)    ED Course/ Medical Decision Making/ A&P                                 Medical Decision Making Amount and/or Complexity of Data Reviewed Labs: ordered. Radiology: ordered.  Risk Prescription drug management. Decision regarding hospitalization.    Medical Screen Complete  This patient presented to the ED with complaint of suspected seizure activity versus nonepileptic seizure activity.  This complaint involves an extensive number of treatment options. The initial differential diagnosis includes, but is not limited to, nonepileptic seizure activity, etc.  This presentation is: Acute, Chronic, Self-Limited, Previously Undiagnosed, Uncertain Prognosis, Complicated, Systemic Symptoms, and Threat to Life/Bodily Function  Patient is transferred from [facility] on IVC hold for evaluation of possible seizure-like episode.  Patient with prior documentation of nonepileptic seizure activity.  Patient with continued shaking episodes while at [facility].  Patient's case discussed briefly with Dr. Selina Cooley with neurology.  She recommends admission for EEG monitoring.  Patient will need bed at [facility].  Hospitalist service made aware of case.    Additional history obtained:  External records from outside sources obtained and reviewed including prior ED visits and prior Inpatient records.    Lab Tests:  I ordered and personally interpreted labs.  The pertinent results include:  cbc bmp   Consultations Obtained:  I consulted neurology,  and discussed lab and imaging findings as well as pertinent plan of care.    Problem List / ED Course:  Seizure-like episode   Reevaluation:  After the interventions noted above, I reevaluated the patient and found that they have: improved   Disposition:  After consideration of the diagnostic results and the patients response to treatment, I feel that the patent would benefit from admission.           Final Clinical Impression(s) / ED Diagnoses Final diagnoses:  Seizure-like activity [facility])    Rx / DC Orders ED Discharge Orders     None         Wynetta Fines, MD 06/01/23 1546

## 2023-06-01 NOTE — Progress Notes (Cosign Needed Addendum)
Patient ID: Vincent Frank, male   DOB: Jun 23, 2001, 22 y.o.   MRN: [id number] Writer responded to an emergency call in pt's room to find him in bed with rhythmic jerking movements of all of his extremities. Nursing staff placed him in a side lying position to secure airway, and vitals monitored (see flowsheets). Orders given for Ativan 2 mg IM which was administered. The first seizure like activity began at 1213 through 1217 & second one started at 1222-1223.  Orders given to transfer patient to the [facility] for medical evaluation status post seizure like activity. Report called to the [facility].  Pt was able to wake up when EMS got to the unit, and was alert when he left the hospital. There was no incontinence.

## 2023-06-01 NOTE — Group Note (Signed)
Recreation Therapy Group Note   Group Topic:Animal Assisted Therapy   Group Date: 06/01/2023 Start Time: 1610 End Time: 1030 Facilitators: Raquel Racey-McCall, LRT,CTRS Location: 300 Hall Dayroom   Animal-Assisted Activity (AAA) Program Checklist/Progress Notes Patient Eligibility Criteria Checklist & Daily Group note for Rec Tx Intervention  AAA/T Program Assumption of Risk Form signed by Patient/ or Parent Legal Guardian Yes  Patient understands his/her participation is voluntary Yes  Education: Charity fundraiser, Appropriate Animal Interaction   Education Outcome: Acknowledges education.    Affect/Mood: N/A   Participation Level: Did not attend    Clinical Observations/Individualized Feedback:     Plan: Continue to engage patient in RT group sessions 2-3x/week.   Findley Blankenbaker-McCall, LRT,CTRS 06/01/2023 12:14 PM

## 2023-06-01 NOTE — H&P (Signed)
History and Physical    Vincent Frank HQI:[id number] DOB: Oct 29, 2000 DOA: 05/25/2023  PCP: Pcp, No Patient coming from: [facility]  Chief Complaint: Seizure  HPI: Vincent Frank is a 22 y.o. male with medical history significant of hx of MDD, seizure like dx initially came to [facility] ED for suicide OD on benadryl. Reports this was accidental OD and was admitted to [facility]. While at [facility], he had several seizure type of episodes and was brought to the ED last night, and eventually sent back. But earlier today had another 4-5 episodes each last time lasting 3-5 mins.   In the ED basic work up including CTH is neg. EDP discussed case with Dr Selina Cooley who recommended admitting him to [facility] for LTM.    Review of Systems: As per HPI otherwise 10 point review of systems negative.  Review of Systems Otherwise negative except as per HPI, including: General: Denies fever, chills, night sweats or unintended weight loss. Resp: Denies cough, wheezing, shortness of breath. Cardiac: Denies chest pain, palpitations, orthopnea, paroxysmal nocturnal dyspnea. GI: Denies abdominal pain, nausea, vomiting, diarrhea or constipation GU: Denies dysuria, frequency, hesitancy or incontinence MS: Denies muscle aches, joint pain or swelling Neuro: Denies headache, neurologic deficits (focal weakness, numbness, tingling), abnormal gait Psych: Denies anxiety, depression, SI/HI/AVH Skin: Denies new rashes or lesions ID: Denies sick contacts, exotic exposures, travel  Past Medical History:  Diagnosis Date   Major depressive disorder    Seizures (HCC)     Past Surgical History:  Procedure Laterality Date   NO PAST SURGERIES      SOCIAL HISTORY:  reports that he has never smoked. He has never used smokeless tobacco. He reports that he does not drink alcohol and does not use drugs.  Allergies  Allergen Reactions   Bee Venom Anaphylaxis   Haldol [Haloperidol] Anaphylaxis    Per father dystonic reaction with   muscle contractures.   Hornet Venom Anaphylaxis and Itching   Gabapentin     twitching    FAMILY HISTORY: Family History  Problem Relation Age of Onset   Healthy Mother      Prior to Admission medications   Medication Sig Start Date End Date Taking? Authorizing Provider  ARIPiprazole (ABILIFY) 5 MG tablet Take 1 tablet (5 mg total) by mouth at bedtime. 06/01/23   Massengill, [name], MD  FLUoxetine (PROZAC) 20 MG capsule Take 3 capsules (60 mg total) by mouth daily. 06/01/23   Massengill, [name], MD  hydrOXYzine (ATARAX) 25 MG tablet Take 1 tablet (25 mg total) by mouth 3 (three) times daily as needed for anxiety. 06/01/23   Massengill, [name], MD  levETIRAcetam (KEPPRA) 500 MG tablet Take 1 tablet (500 mg total) by mouth every 12 (twelve) hours. 06/01/23   Massengill, [name], MD  traZODone (DESYREL) 50 MG tablet Take 1 tablet (50 mg total) by mouth at bedtime. 06/01/23   Phineas Inches, MD    Physical Exam: Vitals:   06/01/23 1105 06/01/23 1113 06/01/23 1245 06/01/23 1525  BP: (!) 140/76 93/74 (!) 108/52 127/74  Pulse: 83 (!) 109 65 74  Resp: 18 18  17   Temp:    98.6 F (37 C)  TempSrc:    Oral  SpO2: 99% 99% 97% 98%  Weight:    107.5 kg  Height:    6\' 3"  (1.905 m)      Constitutional: NAD, calm, comfortable Eyes: PERRL, lids and conjunctivae normal ENMT: Mucous membranes are moist. Posterior pharynx clear of any exudate or lesions.Normal dentition.  Neck: normal, supple, no masses, no thyromegaly Respiratory: clear to auscultation bilaterally, no wheezing, no crackles. Normal respiratory effort. No accessory muscle use.  Cardiovascular: Regular rate and rhythm, no murmurs / rubs / gallops. No extremity edema. 2+ pedal pulses. No carotid bruits.  Abdomen: no tenderness, no masses palpated. No hepatosplenomegaly. Bowel sounds positive.  Musculoskeletal: no clubbing / cyanosis. No joint deformity upper and lower extremities. Good ROM, no contractures. Normal muscle tone.   Skin: no rashes, lesions, ulcers. No induration Neurologic: CN 2-12 grossly intact. Sensation intact, DTR normal. Strength 5/5 in all 4.  Psychiatric: Normal judgment and insight. Alert and oriented x 3.    Body mass index is 29.62 kg/m.      Labs on Admission: I have personally reviewed following labs and imaging studies  CBC: Recent Labs  Lab 05/25/23 2240 06/01/23 1400  WBC 7.1 8.4  NEUTROABS 5.0 6.4  HGB 9.4* 12.8*  HCT 29.8* 39.3  MCV 81.0 79.1*  PLT 187 263   Basic Metabolic Panel: Recent Labs  Lab 05/25/23 2240 06/01/23 1400  NA 137 138  K 3.7 3.6  CL 109 106  CO2 21* 23  GLUCOSE 86 93  BUN 11 19  CREATININE 1.36* 1.38*  CALCIUM 8.8* 9.5   GFR: Estimated Creatinine Clearance: 114.1 mL/min (A) (by C-G formula based on SCr of 1.38 mg/dL (H)). Liver Function Tests: No results for input(s): "AST", "ALT", "ALKPHOS", "BILITOT", "PROT", "ALBUMIN" in the last 168 hours. No results for input(s): "LIPASE", "AMYLASE" in the last 168 hours. No results for input(s): "AMMONIA" in the last 168 hours. Coagulation Profile: No results for input(s): "INR", "PROTIME" in the last 168 hours. Cardiac Enzymes: No results for input(s): "CKTOTAL", "CKMB", "CKMBINDEX", "TROPONINI" in the last 168 hours. BNP (last 3 results) No results for input(s): "PROBNP" in the last 8760 hours. HbA1C: No results for input(s): "HGBA1C" in the last 72 hours. CBG: Recent Labs  Lab 05/25/23 2148  GLUCAP 88   Lipid Profile: No results for input(s): "CHOL", "HDL", "LDLCALC", "TRIG", "CHOLHDL", "LDLDIRECT" in the last 72 hours. Thyroid Function Tests: No results for input(s): "TSH", "T4TOTAL", "FREET4", "T3FREE", "THYROIDAB" in the last 72 hours. Anemia Panel: Recent Labs    05/30/23 1824  VITAMINB12 294  FOLATE 6.4   Urine analysis:    Component Value Date/Time   COLORURINE YELLOW 05/25/2023 0232   APPEARANCEUR CLEAR 05/25/2023 0232   LABSPEC 1.013 05/25/2023 0232   PHURINE 5.0  05/25/2023 0232   GLUCOSEU 50 (A) 05/25/2023 0232   HGBUR NEGATIVE 05/25/2023 0232   BILIRUBINUR NEGATIVE 05/25/2023 0232   KETONESUR NEGATIVE 05/25/2023 0232   PROTEINUR NEGATIVE 05/25/2023 0232   NITRITE NEGATIVE 05/25/2023 0232   LEUKOCYTESUR NEGATIVE 05/25/2023 0232   Sepsis Labs: !!!!!!!!!!!!!!!!!!!!!!!!!!!!!!!!!!!!!!!!!!!! @LABRCNTIP (procalcitonin:4,lacticidven:4) )No results found for this or any previous visit (from the past 240 hour(s)).   Radiological Exams on Admission: CT Head Wo Contrast  Result Date: 05/31/2023 CLINICAL DATA:  Altered mental status EXAM: CT HEAD WITHOUT CONTRAST TECHNIQUE: Contiguous axial images were obtained from the base of the skull through the vertex without intravenous contrast. RADIATION DOSE REDUCTION: This exam was performed according to the departmental dose-optimization program which includes automated exposure control, adjustment of the mA and/or kV according to patient size and/or use of iterative reconstruction technique. COMPARISON:  05/04/2023 FINDINGS: Brain: No evidence of acute infarction, hemorrhage, hydrocephalus, extra-axial collection or mass lesion/mass effect. Vascular: No hyperdense vessel or unexpected calcification. Skull: Normal. Negative for fracture or focal lesion. Sinuses/Orbits: The visualized paranasal sinuses  are essentially clear. The mastoid air cells are unopacified. Other: None. IMPRESSION: Normal head CT. Electronically Signed   By: Charline Bills M.D.   On: 05/31/2023 22:54    Nutritional status  All images have been reviewed by me personally.    Assessment/Plan Principal Problem:   MDD (major depressive disorder) Active Problems:   Psychogenic nonepileptic seizure    Seizure, tonic clonic.  -Admit to [facility] under obs for LTM. Neuro, Dr Selina Cooley consulted.  -Appears he has been on Keppra 500mg  bid.  -Possible psychogenic seizures.  -Avoid benzo's for now until on LTM  MDD, recurrent -Cont meds from [facility], Abilify,  prozac, trazodone, Atarax.   Constipation -prn bowel regimen.     DVT prophylaxis: Lovenox Code Status: Full Family Communication:  Consults called: Neuro, Dr Selina Cooley Admission status: Admit to [facility] for Obs  Observation admission.    Time Spent: 65 minutes.  >50% of the time was devoted to discussing the patients care, assessment, plan and disposition with other care givers along with counseling the patient about the risks and benefits of treatment.    Miguel Rota MD [facility]  If 7PM-7AM, please contact night-coverage   06/01/2023, 4:26 PM

## 2023-06-01 NOTE — Progress Notes (Signed)
Pt was asleep most of the morning between seizure activity. Unable to assess pt as they were too drowsy.   06/01/23 1500  Psych Admission Type (Psych Patients Only)  Admission Status Involuntary  Psychosocial Assessment  Patient Complaints None  Eye Contact Avoids  Facial Expression Flat  Affect UTA  Speech UTA  Interaction Avoidant  Motor Activity Slow  Appearance/Hygiene Unremarkable  Behavior Characteristics Unable to participate  Mood Depressed  Thought Process  Coherency Unable to assess  Content UTA  Delusions UTA  Perception UTA  Hallucination UTA  Judgment UTA  Confusion UTA

## 2023-06-01 NOTE — ED Triage Notes (Signed)
Pt BIB EMS from [facility], c/o seizure-like activity. Sent out after reportedly four seizures at the hospital.

## 2023-06-01 NOTE — ED Notes (Addendum)
Report given to Jillian, rn

## 2023-06-01 NOTE — Discharge Summary (Signed)
Physician Discharge Summary Note  Patient:  Vincent Frank is an 22 y.o., male MRN:  [id number] DOB:  08-06-2000 Patient phone:  [phone] (home)  Patient address:   [address] [place] [facility] [address],  Total Time spent with patient: 20 minutes  Date of Admission:  05/25/2023 Date of Discharge: 06-01-2023  Reason for Admission:   Vincent Frank is a 35 y.o., male with a past psychiatric history significant for MDD and seizure-like activity who presents to the [facility] Involuntary from [facility] Emergency Department for evaluation and management of suicide attempt of overdosing on Benadryl.    Initial assessment on 10/30, patient was evaluated on the inpatient unit, the patient reports he had an accidental overdose. He was walking in the woods two nights ago and he was intoxicated on alcohol and he was on the phone with his brother and he got stung and he asked his brother how much Bendryl he should take. His brother told him to take "25" and patient reports taking that exact amount. Afterwards, he kept walking in the woods and he started feeling the effects of the medications and he got stuck in the mud. He then called 911 due to being stuck in the mud. He denies trying to take his life when taking the Benadryl. The sting was on his right forearm. His motivating factors are his job, his family, and general life. He works as a IT sales professional for 1.5 years.      Principal Problem: MDD (major depressive disorder) Discharge Diagnoses: Principal Problem:   MDD (major depressive disorder) Active Problems:   Psychogenic nonepileptic seizure   Past Psychiatric History:  Previous Psych Diagnoses: MDD Prior psychiatric treatment: did not answer Psychiatric medication compliance history: compliant   Current psychiatric treatment: abilify, prozac, trazodone Current psychiatrist: most recently 2 weeks ago Current therapist: most recently weeks ago     Previous hospitalizations twice- unsure when History of suicide attempts: per chart review once 2 years ago attempting to jump off roof (currently denies) History of self harm: most recently 2.  Past Medical History:  Past Medical History:  Diagnosis Date   Major depressive disorder    Seizures (HCC)     Past Surgical History:  Procedure Laterality Date   NO PAST SURGERIES     Family History:  Family History  Problem Relation Age of Onset   Healthy Mother    Family Psychiatric  History: See H&P    Social History:  Social History   Substance and Sexual Activity  Alcohol Use Never     Social History   Substance and Sexual Activity  Drug Use Never    Social History   Socioeconomic History   Marital status: Single    Spouse name: Not on file   Number of children: Not on file   Years of education: Not on file   Highest education level: Not on file  Occupational History   Not on file  Tobacco Use   Smoking status: Never   Smokeless tobacco: Never  Vaping Use   Vaping status: Never Used  Substance and Sexual Activity   Alcohol use: Never   Drug use: Never   Sexual activity: Not Currently  Other Topics Concern   Not on file  Social History Narrative   ** Merged History Encounter **       Social Determinants of Health   Financial Resource Strain: Low Risk  (05/07/2023)   Received from [facility]   Overall  Financial Resource Strain (CARDIA)    Difficulty of Paying Living Expenses: Not very hard  Food Insecurity: No Food Insecurity (05/25/2023)   Hunger Vital Sign    Worried About Running Out of Food in the Last Year: Never true    Ran Out of Food in the Last Year: Never true  Transportation Needs: Unknown (05/25/2023)   PRAPARE - Administrator, Civil Service (Medical): Patient declined    Lack of Transportation (Non-Medical): No  Recent Concern: Transportation Needs - Unmet Transportation Needs (05/07/2023)   Received  from [facility] - Transportation    In the past 12 months, has lack of transportation kept you from medical appointments or from getting medications?: No    Lack of Transportation (Non-Medical): Yes  Physical Activity: Not on file  Stress: Not on file  Social Connections: Unknown (01/16/2023)   Received from [facility]   Social Network    Social Network: Not on file    Hospital Course:   During the patient's hospitalization, patient had extensive initial psychiatric evaluation, and follow-up psychiatric evaluations every day.  Psychiatric diagnoses provided upon initial assessment:  MDD, recurrent, severe  Patient's psychiatric medications were adjusted on admission:  -Increase Abilify to 5 mg nightly (home dose was 2 mg) -Increase Prozac to 60 mg daily (home dose was 40 mg)  During the hospitalization, other adjustments were made to the patient's psychiatric medication regimen:  -none  Patient's care was discussed during the interdisciplinary team meeting every day during the hospitalization.  On evening of 05-31-23, pt was transferred to ED due to seizure like activity, but returned to [facility] later that night. On 06-01-23, pt had a series of seizure-like activity episodes and was transferred to the ED for evaluation -he was admitted to [facility] on 06-01-23.    Psychiatric Specialty Exam:  Presentation  General Appearance:  -- (Laying in bed, not verbally responsive, shaking)  Eye Contact: None  Speech: -- (Not verbally responsive)  Speech Volume: Other (comment)  Handedness: Right   Mood and Affect  Mood: -- (Did not comment on)  Affect: -- (Patient having seizure-like activity)   Thought Process  Thought Processes: -- (Could not assess)  Descriptions of Associations:-- (Could not assess)  Orientation:None  Thought Content:-- (Could not assess)  History of Schizophrenia/Schizoaffective disorder:No  Duration of  Psychotic Symptoms:No data recorded Hallucinations:Hallucinations: None  Ideas of Reference:None  Suicidal Thoughts:Suicidal Thoughts: -- (Could not assess)  Homicidal Thoughts:Homicidal Thoughts: -- (Could not assess)   Sensorium  Memory: Other (comment)  Judgment: Other (comment)  Insight: Other (comment)   Executive Functions  Concentration: Other (comment)  Attention Span: Other (comment)  Recall: Good  Fund of Knowledge: Good  Language: Good   Psychomotor Activity  Psychomotor Activity: Psychomotor Activity: -- (Having seizure like activity)   Assets  Assets: Communication Skills; Desire for Improvement; Social Support   Sleep  Sleep: Sleep: -- (Could not assess)    Physical Exam: Physical Exam Neurological:     Comments: Having seizure like activity     Review of Systems  Neurological:        Having seizure like activity    Blood pressure 127/74, pulse 74, temperature 98.6 F (37 C), temperature source Oral, resp. rate 17, height 6\' 3"  (1.905 m), weight 107.5 kg, SpO2 98%. Body mass index is 29.62 kg/m.   Social History   Tobacco Use  Smoking Status Never  Smokeless Tobacco Never   Tobacco Cessation:  N/A, patient does not currently use tobacco products   Blood Alcohol level:  Lab Results  Component Value Date   ETH <10 05/24/2023   ETH <10 05/23/2023    Metabolic Disorder Labs:  Lab Results  Component Value Date   HGBA1C 5.2 03/03/2023   MPG 102.54 03/03/2023   MPG 96.8 07/07/2021   No results found for: "PROLACTIN" Lab Results  Component Value Date   CHOL 217 (H) 03/03/2023   TRIG 82 03/03/2023   HDL 39 (L) 03/03/2023   CHOLHDL 5.6 03/03/2023   VLDL 16 03/03/2023   LDLCALC 162 (H) 03/03/2023   LDLCALC 138 (H) 07/07/2021    See Psychiatric Specialty Exam and Suicide Risk Assessment completed by Attending Physician prior to discharge.  Discharge destination:  Other:  [facility] hospital  Is patient on multiple  antipsychotic therapies at discharge:  No   Has Patient had three or more failed trials of antipsychotic monotherapy by history:  No  Recommended Plan for Multiple Antipsychotic Therapies: NA  Discharge Instructions     Diet - low sodium heart healthy   Complete by: As directed       Allergies as of 06/01/2023       Reactions   Bee Venom Anaphylaxis   Haldol [haloperidol] Anaphylaxis   Per father dystonic reaction with  muscle contractures.   Hornet Venom Anaphylaxis, Itching   Gabapentin    twitching        Medication List     STOP taking these medications    acetaminophen 325 MG tablet Commonly known as: TYLENOL   fenofibrate 145 MG tablet Commonly known as: TRICOR   ferrous sulfate 325 (65 FE) MG tablet   polyethylene glycol 17 g packet Commonly known as: MIRALAX / GLYCOLAX       TAKE these medications      Indication  ARIPiprazole 5 MG tablet Commonly known as: ABILIFY Take 1 tablet (5 mg total) by mouth at bedtime. What changed:  medication strength how much to take additional instructions  Indication: Antidepressant augmentation.   FLUoxetine 20 MG capsule Commonly known as: PROZAC Take 3 capsules (60 mg total) by mouth daily. What changed:  medication strength how much to take when to take this additional instructions  Indication: Major Depressive Disorder   hydrOXYzine 25 MG tablet Commonly known as: ATARAX Take 1 tablet (25 mg total) by mouth 3 (three) times daily as needed for anxiety. What changed: when to take this    levETIRAcetam 500 MG tablet Commonly known as: KEPPRA Take 1 tablet (500 mg total) by mouth every 12 (twelve) hours. What changed: when to take this    traZODone 50 MG tablet Commonly known as: DESYREL Take 1 tablet (50 mg total) by mouth at bedtime. What changed:  when to take this reasons to take this         Follow-up Information     [facility] Schedule an appointment as soon as  possible for a visit.   Why: Please call to schedule an appointment with [facility] for therapy services as soon as possible, as we were unable to contact prior to your discharge. Contact information: [address] [place] [address] [phone]         [facility]. Schedule an appointment as soon as possible for a visit.   Why: Please call to schedule an appointment for medication management services with Dr. Ardyth Gal. Contact information: [address] [place], [place] [address]  Phone: [phone]  Follow-up recommendations:    Pt transferred/admitted to [facility] hospital.   Activity: as tolerated  Diet: heart healthy  Other: -Follow-up with your outpatient psychiatric provider -instructions on appointment date, time, and address (location) are provided to you in discharge paperwork.  -Take your psychiatric medications as prescribed at discharge  -Follow-up with outpatient primary care doctor and other specialists -for management of preventative medicine and chronic medical disease  -If you are prescribed an atypical antipsychotic medication, we recommend that your outpatient psychiatrist follow routine screening for side effects within 3 months of discharge, including monitoring: AIMS scale, height, weight, blood pressure, fasting lipid panel, HbA1c, and fasting blood sugar.   -Recommend total abstinence from alcohol, tobacco, and other illicit drug use at discharge.   -If your psychiatric symptoms recur, worsen, or if you have side effects to your psychiatric medications, call your outpatient psychiatric provider, 911, [phone] or go to the nearest emergency department.  -If suicidal thoughts occur, immediately call your outpatient psychiatric provider, 911, [phone] or go to the nearest emergency department.   Signed: Phineas Inches, MD 06/01/2023, 3:40 PM   Total Time Spent in Direct Patient Care:  I personally spent 40 minutes  on the unit in direct patient care. The direct patient care time included face-to-face time with the patient, reviewing the patient's chart, communicating with other professionals, and coordinating care. Greater than 50% of this time was spent in counseling or coordinating care with the patient regarding goals of hospitalization, psycho-education, and discharge planning needs.   Phineas Inches, MD Psychiatrist

## 2023-06-01 NOTE — [facility] Suicide Risk Assessment (Signed)
[facility] Discharge Suicide Risk Assessment   Principal Problem: MDD (major depressive disorder) Discharge Diagnoses: Principal Problem:   MDD (major depressive disorder) Active Problems:   Psychogenic nonepileptic seizure   Total Time spent with patient: 20 minutes   DEBORAH DONDERO is a 22 y.o., male with a past psychiatric history significant for MDD and seizure-like activity who presents to the [facility] Involuntary from [facility] Emergency Department for evaluation and management of suicide attempt of overdosing on Benadryl.   During the patient's hospitalization, patient had extensive initial psychiatric evaluation, and follow-up psychiatric evaluations every day.   Psychiatric diagnoses provided upon initial assessment:  MDD, recurrent, severe  Patient's psychiatric medications were adjusted on admission:  -Increase Abilify to 5 mg nightly (home dose was 2 mg) -Increase Prozac to 60 mg daily (home dose was 40 mg)   During the hospitalization, other adjustments were made to the patient's psychiatric medication regimen:  -none   Patient's care was discussed during the interdisciplinary team meeting every day during the hospitalization.   On evening of 05-31-23, pt was transferred to ED due to seizure like activity, but returned to [facility] later that night. On 06-01-23, pt had a series of seizure-like activity episodes and was transferred to the ED for evaluation -he was admitted to [facility] hospital on 06-01-23.    Psychiatric Specialty Exam  Presentation  General Appearance:  -- (Laying in bed, not verbally responsive, shaking)  Eye Contact: None  Speech: -- (Not verbally responsive)  Speech Volume: Other (comment)  Handedness: Right   Mood and Affect  Mood: -- (Did not comment on)  Duration of Depression Symptoms: Greater than two weeks  Affect: -- (Patient having seizure-like activity)   Thought Process  Thought Processes: -- (Could not  assess)  Descriptions of Associations:-- (Could not assess)  Orientation:None  Thought Content:-- (Could not assess)  History of Schizophrenia/Schizoaffective disorder:No  Duration of Psychotic Symptoms:No data recorded Hallucinations:Hallucinations: None  Ideas of Reference:None  Suicidal Thoughts:Suicidal Thoughts: -- (Could not assess)  Homicidal Thoughts:Homicidal Thoughts: -- (Could not assess)   Sensorium  Memory: Other (comment)  Judgment: Other (comment)  Insight: Other (comment)   Executive Functions  Concentration: Other (comment)  Attention Span: Other (comment)  Recall: Good  Fund of Knowledge: Good  Language: Good   Psychomotor Activity  Psychomotor Activity: Psychomotor Activity: -- (Having seizure like activity)   Assets  Assets: Communication Skills; Desire for Improvement; Social Support   Sleep  Sleep: Sleep: -- (Could not assess)   Physical Exam: Physical Exam See discharge summary  ROS See discharge summary    Blood pressure 127/74, pulse 74, temperature 98.6 F (37 C), temperature source Oral, resp. rate 17, height 6\' 3"  (1.905 m), weight 107.5 kg, SpO2 98%. Body mass index is 29.62 kg/m.  Mental Status Per Nursing Assessment::   On Admission:  NA  Demographic Factors:  Male, Adolescent or young adult, and Caucasian  Loss Factors: NA  Historical Factors: NA  Risk Reduction Factors:   Unable to assess on day of dc  Continued Clinical Symptoms:  Unable to assess on day of dc/transfer   Cognitive Features That Contribute To Risk:  None    Suicide Risk:  Mild:  There are no identifiable suicide plans, no associated intent, mild dysphoria and related symptoms, good self-control (both objective and subjective assessment), few other risk factors, and identifiable protective factors, including available and accessible social support.    Follow-up Information     [facility] Schedule  an  appointment as soon as possible for a visit.   Why: Please call to schedule an appointment with [facility] for therapy services as soon as possible, as we were unable to contact prior to your discharge. Contact information: [address] [place] [address] [phone]         [facility]. Schedule an appointment as soon as possible for a visit.   Why: Please call to schedule an appointment for medication management services with Dr. Ardyth Gal. Contact information: [address] [place], [place] [address]  Phone: [phone]                Plan Of Care/Follow-up recommendations:   -Follow-up with your outpatient psychiatric provider -instructions on appointment date, time, and address (location) are provided to you in discharge paperwork.   -Take your psychiatric medications as prescribed at discharge   -Follow-up with outpatient primary care doctor and other specialists -for management of preventative medicine and chronic medical disease   -If you are prescribed an atypical antipsychotic medication, we recommend that your outpatient psychiatrist follow routine screening for side effects within 3 months of discharge, including monitoring: AIMS scale, height, weight, blood pressure, fasting lipid panel, HbA1c, and fasting blood sugar.    -Recommend total abstinence from alcohol, tobacco, and other illicit drug use at discharge.    -If your psychiatric symptoms recur, worsen, or if you have side effects to your psychiatric medications, call your outpatient psychiatric provider, 911, [phone] or go to the nearest emergency department.   -If suicidal thoughts occur, immediately call your outpatient psychiatric provider, 911, [phone] or go to the nearest emergency department.   Phineas Inches, MD 06/01/2023, 3:45 PM

## 2023-06-01 NOTE — Plan of Care (Signed)
  Problem: Education: Goal: Knowledge of [facility] Education information/materials will improve Outcome: Progressing Goal: Emotional status will improve Outcome: Not Progressing Goal: Mental status will improve Outcome: Not Progressing Goal: Verbalization of understanding the information provided will improve Outcome: Not Progressing   Problem: Activity: Goal: Interest or engagement in activities will improve Outcome: Not Progressing

## 2023-06-01 NOTE — Progress Notes (Signed)
Addendum to previous note *seizure like activity* instead of pseudoseizure.

## 2023-06-01 NOTE — Progress Notes (Signed)
Addedum to previous note "seizure like activity" instead of pseudoseizure.

## 2023-06-01 NOTE — Progress Notes (Signed)
   06/01/23 0116  Psych Admission Type (Psych Patients Only)  Admission Status Involuntary  Psychosocial Assessment  Patient Complaints None  Eye Contact Fair  Facial Expression Flat  Affect Flat  Speech Logical/coherent  Interaction Assertive  Motor Activity Slow  Appearance/Hygiene Unremarkable  Behavior Characteristics Cooperative  Mood Pleasant  Thought Process  Coherency WDL  Content WDL  Delusions None reported or observed  Perception WDL  Hallucination None reported or observed  Judgment Poor  Confusion None  Danger to Self  Current suicidal ideation? Denies  Agreement Not to Harm Self Yes  Description of Agreement verbal  Danger to Others  Danger to Others None reported or observed  Danger to Others Abnormal  Harmful Behavior to others No threats or harm toward other people

## 2023-06-01 NOTE — Progress Notes (Signed)
Pt began having a pseudoseizure at [phone] and then again 1222-1223. Pt retained continence and did not bite tongue. RN staff stated that since pain wasn't working to get pt to respond then they would have to tickle him to gauge pt reaction . Pt then giggled and smiled. EMS was called.

## 2023-06-01 NOTE — Progress Notes (Signed)
This RN has attempted to wake the pt 4 times for medication. Pt refuses to wake up to come and get medication. Will continue to attempt.

## 2023-06-01 NOTE — Group Note (Unsigned)
LCSW Group Therapy Note  Group Date: 06/01/2023 Start Time: 1100 End Time: 1200   Type of Therapy and Topic:  Group Therapy - Healthy vs Unhealthy Coping Skills  Participation Level:  {BHH PARTICIPATION QIONG:29528}   Description of Group The focus of this group was to determine what unhealthy coping techniques typically are used by group members and what healthy coping techniques would be helpful in coping with various problems. Patients were guided in becoming aware of the differences between healthy and unhealthy coping techniques. Patients were asked to identify 2-3 healthy coping skills they would like to learn to use more effectively.  Therapeutic Goals 1. Patients learned that coping is what human beings do all day long to deal with various situations in their lives 2. Patients defined and discussed healthy vs unhealthy coping techniques 3. Patients identified their preferred coping techniques and identified whether these were healthy or unhealthy 4. Patients determined 2-3 healthy coping skills they would like to become more familiar with and use more often. 5. Patients provided support and ideas to each other   Summary of Patient Progress:  During group, *** expressed ***. Patient proved open to input from peers and feedback from CSW. Patient demonstrated *** insight into the subject matter, was respectful of peers, and participated throughout the entire session.   Therapeutic Modalities Cognitive Behavioral Therapy Motivational Interviewing  Kathi Der, LCSWA 06/01/2023  1:25 PM

## 2023-06-01 NOTE — Progress Notes (Signed)
Nursing 1:1 Note  Pt is resting in bed, breathing unlabored. Pt is calm. Pt refuses to get up for medication. Pt remains safe with sitter and contracts for safety.

## 2023-06-02 ENCOUNTER — Observation Stay (HOSPITAL_COMMUNITY): Payer: BC Managed Care – PPO

## 2023-06-02 ENCOUNTER — Encounter (HOSPITAL_COMMUNITY): Payer: Self-pay | Admitting: Internal Medicine

## 2023-06-02 DIAGNOSIS — Z9152 Personal history of nonsuicidal self-harm: Secondary | ICD-10-CM | POA: Diagnosis not present

## 2023-06-02 DIAGNOSIS — F339 Major depressive disorder, recurrent, unspecified: Secondary | ICD-10-CM | POA: Diagnosis present

## 2023-06-02 DIAGNOSIS — F332 Major depressive disorder, recurrent severe without psychotic features: Secondary | ICD-10-CM | POA: Diagnosis present

## 2023-06-02 DIAGNOSIS — Z818 Family history of other mental and behavioral disorders: Secondary | ICD-10-CM | POA: Diagnosis not present

## 2023-06-02 DIAGNOSIS — Z5982 Transportation insecurity: Secondary | ICD-10-CM | POA: Diagnosis not present

## 2023-06-02 DIAGNOSIS — F445 Conversion disorder with seizures or convulsions: Secondary | ICD-10-CM

## 2023-06-02 DIAGNOSIS — R569 Unspecified convulsions: Secondary | ICD-10-CM

## 2023-06-02 DIAGNOSIS — Z79899 Other long term (current) drug therapy: Secondary | ICD-10-CM | POA: Diagnosis not present

## 2023-06-02 DIAGNOSIS — E559 Vitamin D deficiency, unspecified: Secondary | ICD-10-CM | POA: Diagnosis present

## 2023-06-02 DIAGNOSIS — R339 Retention of urine, unspecified: Secondary | ICD-10-CM | POA: Diagnosis present

## 2023-06-02 DIAGNOSIS — Z9151 Personal history of suicidal behavior: Secondary | ICD-10-CM | POA: Diagnosis not present

## 2023-06-02 DIAGNOSIS — Z9181 History of falling: Secondary | ICD-10-CM | POA: Diagnosis not present

## 2023-06-02 DIAGNOSIS — F411 Generalized anxiety disorder: Secondary | ICD-10-CM | POA: Diagnosis present

## 2023-06-02 DIAGNOSIS — G4700 Insomnia, unspecified: Secondary | ICD-10-CM | POA: Diagnosis present

## 2023-06-02 DIAGNOSIS — K5900 Constipation, unspecified: Secondary | ICD-10-CM | POA: Diagnosis present

## 2023-06-02 DIAGNOSIS — R064 Hyperventilation: Secondary | ICD-10-CM | POA: Diagnosis present

## 2023-06-02 DIAGNOSIS — Z91030 Bee allergy status: Secondary | ICD-10-CM | POA: Diagnosis not present

## 2023-06-02 DIAGNOSIS — Z885 Allergy status to narcotic agent status: Secondary | ICD-10-CM | POA: Diagnosis not present

## 2023-06-02 DIAGNOSIS — G40909 Epilepsy, unspecified, not intractable, without status epilepticus: Secondary | ICD-10-CM | POA: Diagnosis not present

## 2023-06-02 DIAGNOSIS — Z888 Allergy status to other drugs, medicaments and biological substances status: Secondary | ICD-10-CM | POA: Diagnosis not present

## 2023-06-02 LAB — URINALYSIS, ROUTINE W REFLEX MICROSCOPIC
Bilirubin Urine: NEGATIVE
Glucose, UA: NEGATIVE mg/dL
Hgb urine dipstick: NEGATIVE
Ketones, ur: NEGATIVE mg/dL
Leukocytes,Ua: NEGATIVE
Nitrite: NEGATIVE
Protein, ur: NEGATIVE mg/dL
Specific Gravity, Urine: 1.019 (ref 1.005–1.030)
pH: 6 (ref 5.0–8.0)

## 2023-06-02 LAB — RAPID URINE DRUG SCREEN, HOSP PERFORMED
Amphetamines: NOT DETECTED
Barbiturates: NOT DETECTED
Benzodiazepines: NOT DETECTED
Cocaine: NOT DETECTED
Opiates: NOT DETECTED
Tetrahydrocannabinol: NOT DETECTED

## 2023-06-02 LAB — COMPREHENSIVE METABOLIC PANEL
ALT: 33 U/L (ref 0–44)
AST: 24 U/L (ref 15–41)
Albumin: 3.8 g/dL (ref 3.5–5.0)
Alkaline Phosphatase: 67 U/L (ref 38–126)
Anion gap: 6 (ref 5–15)
BUN: 15 mg/dL (ref 6–20)
CO2: 24 mmol/L (ref 22–32)
Calcium: 9.4 mg/dL (ref 8.9–10.3)
Chloride: 108 mmol/L (ref 98–111)
Creatinine, Ser: 1.36 mg/dL — ABNORMAL HIGH (ref 0.61–1.24)
GFR, Estimated: >60 mL/min (ref >60)
Glucose, Bld: 96 mg/dL (ref 70–99)
Potassium: 3.6 mmol/L (ref 3.5–5.1)
Sodium: 138 mmol/L (ref 135–145)
Total Bilirubin: 0.5 mg/dL (ref <1.2)
Total Protein: 6.6 g/dL (ref 6.5–8.1)

## 2023-06-02 LAB — CBC
HCT: 37.1 % — ABNORMAL LOW (ref 39.0–52.0)
Hemoglobin: 12.2 g/dL — ABNORMAL LOW (ref 13.0–17.0)
MCH: 25.5 pg — ABNORMAL LOW (ref 26.0–34.0)
MCHC: 32.9 g/dL (ref 30.0–36.0)
MCV: 77.6 fL — ABNORMAL LOW (ref 80.0–100.0)
Platelets: 249 10*3/uL (ref 150–400)
RBC: 4.78 MIL/uL (ref 4.22–5.81)
RDW: 12.4 % (ref 11.5–15.5)
WBC: 7.8 10*3/uL (ref 4.0–10.5)
nRBC: 0 % (ref 0.0–0.2)

## 2023-06-02 LAB — CK: Total CK: 280 U/L (ref 49–397)

## 2023-06-02 NOTE — Progress Notes (Signed)
LTM EEG hooked up and running - no initial skin breakdown - push button tested - [facility] monitoring.  

## 2023-06-02 NOTE — Procedures (Signed)
Patient Name: Vincent Frank  MRN: [id number]  Epilepsy Attending: Charlsie Quest  Referring Physician/Provider: Erick Blinks, MD  Duration: 06/02/2023 0526 to 06/03/2023 0526  Patient history: 22 y.o. male with hx of MDD, seizure like activit who presented to the ED for Benadryl overdose. Transferred for persistent episodes concerning for potential seizures. EEG to evaluate for seizure  Level of alertness: Awake, asleep  AEDs during EEG study: LEV  Technical aspects: This EEG study was done with scalp electrodes positioned according to the 10-20 International system of electrode placement. Electrical activity was reviewed with band pass filter of 1-70Hz , sensitivity of 7 uV/mm, display speed of 33mm/sec with a 60Hz  notched filter applied as appropriate. EEG data were recorded continuously and digitally stored.  Video monitoring was available and reviewed as appropriate.  Description: The posterior dominant rhythm consists of 8 Hz activity of moderate voltage (25-35 uV) seen predominantly in posterior head regions, symmetric and reactive to eye opening and eye closing. Sleep was characterized by vertex waves, sleep spindles (12 to 14 Hz), maximal frontocentral region. No EEG change was seen during hyperventilation.  Photic driving was not seen during photic stimulation.    One event was recorded on 06/02/2023 at 1339.  Patient was undergoing photic stimulation followed by hyperventilation.  Patient reported "feeling woozy". He then had subtle shaking in bilateral lower extremities followed by left arm twitching. Concomitant EEG before, during and after the event showed normal posterior dominant rhythm and did not show any EEG change to suggest seizure.  IMPRESSION: This study is within normal limits. No seizures or epileptiform discharges were seen throughout the recording.  One event was recorded on 06/02/2023 at 1339 as described above without concomitant EEG change.  This was a  nonepileptic event.  A normal interictal EEG does not exclude the diagnosis of epilepsy.  Eddrick Dilone Annabelle Harman

## 2023-06-02 NOTE — Progress Notes (Signed)
PROGRESS NOTE    Vincent Frank  WNU:[id number] DOB: 2000/12/01 DOA: 06/01/2023 PCP: Pcp, No   Brief Narrative:  22 y.o. male with medical history significant of hx of MDD, seizure like dx initially came to [facility] ED for suicide OD on benadryl. Reports this was accidental OD and was admitted to [facility]. While at [facility], he had several seizure type of episodes and was brought to the ED last night, and eventually sent back. But earlier today had another 4-5 episodes each last time lasting 3-5 mins.  Transferred to [facility] for LTM, neurology consulted.   Assessment & Plan:  Principal Problem:   Seizure (HCC)      Seizure, tonic clonic.  Currently on Keppra, neurology consulted.  LTM ongoing so far no seizures.   MDD, recurrent -Cont meds from [facility] Abilify, prozac, trazodone, Atarax.  Will request psych to reengage.   Constipation -prn bowel regimen.    DVT prophylaxis:Lovenox. Code Status: Full Family Communication:   So far no seizures, will request psych to reengage.  Should be able to transition him back to [facility] tomorrow   Subjective: No complaints, so far no seizures.   Examination:  General exam: Appears calm and comfortable  Respiratory system: Clear to auscultation. Respiratory effort normal. Cardiovascular system: S1 & S2 heard, RRR. No JVD, murmurs, rubs, gallops or clicks. No pedal edema. Gastrointestinal system: Abdomen is nondistended, soft and nontender. No organomegaly or masses felt. Normal bowel sounds heard. Central nervous system: Alert and oriented. No focal neurological deficits. Extremities: Symmetric 5 x 5 power. Skin: No rashes, lesions or ulcers Psychiatry: Judgement and insight appear normal. Mood & affect appropriate.      Diet Orders (From admission, onward)     Start     Ordered   06/01/23 2133  Diet regular Room service appropriate? Yes; Fluid consistency: Thin  Diet effective now       Question Answer Comment  Room service  appropriate? Yes   Fluid consistency: Thin      06/01/23 2132            Objective: Vitals:   06/01/23 2026 06/01/23 2354 06/02/23 0410 06/02/23 0808  BP:  (!) 118/58 (!) 107/56 (!) 100/54  Pulse:  68 (!) 56 60  Resp:  19 17 18   Temp:  97.9 F (36.6 C) 98.1 F (36.7 C) 97.6 F (36.4 C)  TempSrc:  Oral Oral Oral  SpO2:  97% 98% 98%  Weight: 107.6 kg     Height: 6\' 3"  (1.905 m)       Intake/Output Summary (Last 24 hours) at 06/02/2023 0916 Last data filed at 06/02/2023 0000 Gross per 24 hour  Intake 480 ml  Output 1000 ml  Net -520 ml   Filed Weights   06/01/23 2026  Weight: 107.6 kg    Scheduled Meds:  ARIPiprazole  5 mg Oral QHS   cholecalciferol  2,000 Units Oral Daily   docusate sodium  100 mg Oral BID   enoxaparin (LOVENOX) injection  40 mg Subcutaneous QHS   FLUoxetine  60 mg Oral Daily   levETIRAcetam  500 mg Oral Q12H   polyethylene glycol  17 g Oral BID   traZODone  50 mg Oral QHS   Continuous Infusions:  Nutritional status     Body mass index is 29.65 kg/m.  Data Reviewed:   CBC: Recent Labs  Lab 06/01/23 1400 06/02/23 0501  WBC 8.4 7.8  NEUTROABS 6.4  --   HGB 12.8* 12.2*  HCT 39.3 37.1*  MCV 79.1* 77.6*  PLT 263 249   Basic Metabolic Panel: Recent Labs  Lab 06/01/23 1400 06/02/23 0501  NA 138 138  K 3.6 3.6  CL 106 108  CO2 23 24  GLUCOSE 93 96  BUN 19 15  CREATININE 1.38* 1.36*  CALCIUM 9.5 9.4   GFR: Estimated Creatinine Clearance: 115.8 mL/min (A) (by C-G formula based on SCr of 1.36 mg/dL (H)). Liver Function Tests: Recent Labs  Lab 06/02/23 0501  AST 24  ALT 33  ALKPHOS 67  BILITOT 0.5  PROT 6.6  ALBUMIN 3.8   No results for input(s): "LIPASE", "AMYLASE" in the last 168 hours. No results for input(s): "AMMONIA" in the last 168 hours. Coagulation Profile: No results for input(s): "INR", "PROTIME" in the last 168 hours. Cardiac Enzymes: Recent Labs  Lab 06/01/23 2328  CKTOTAL 280   BNP (last 3  results) No results for input(s): "PROBNP" in the last 8760 hours. HbA1C: No results for input(s): "HGBA1C" in the last 72 hours. CBG: No results for input(s): "GLUCAP" in the last 168 hours. Lipid Profile: No results for input(s): "CHOL", "HDL", "LDLCALC", "TRIG", "CHOLHDL", "LDLDIRECT" in the last 72 hours. Thyroid Function Tests: No results for input(s): "TSH", "T4TOTAL", "FREET4", "T3FREE", "THYROIDAB" in the last 72 hours. Anemia Panel: Recent Labs    05/30/23 1824  VITAMINB12 294  FOLATE 6.4   Sepsis Labs: No results for input(s): "PROCALCITON", "LATICACIDVEN" in the last 168 hours.  No results found for this or any previous visit (from the past 240 hour(s)).       Radiology Studies: CT Head Wo Contrast  Result Date: 05/31/2023 CLINICAL DATA:  Altered mental status EXAM: CT HEAD WITHOUT CONTRAST TECHNIQUE: Contiguous axial images were obtained from the base of the skull through the vertex without intravenous contrast. RADIATION DOSE REDUCTION: This exam was performed according to the departmental dose-optimization program which includes automated exposure control, adjustment of the mA and/or kV according to patient size and/or use of iterative reconstruction technique. COMPARISON:  05/04/2023 FINDINGS: Brain: No evidence of acute infarction, hemorrhage, hydrocephalus, extra-axial collection or mass lesion/mass effect. Vascular: No hyperdense vessel or unexpected calcification. Skull: Normal. Negative for fracture or focal lesion. Sinuses/Orbits: The visualized paranasal sinuses are essentially clear. The mastoid air cells are unopacified. Other: None. IMPRESSION: Normal head CT. Electronically Signed   By: Charline Bills M.D.   On: 05/31/2023 22:54           LOS: 0 days   Time spent= 35 mins    Miguel Rota, MD [facility]  If 7PM-7AM, please contact night-coverage  06/02/2023, 9:16 AM

## 2023-06-02 NOTE — TOC Initial Note (Signed)
Transition of Care [facility]) - Initial/Assessment Note    Patient Details  Name: Vincent Frank MRN: [id number] Date of Birth: May 11, 2001  Transition of [facility]) CM/SW Contact:    Baldemar Lenis, LCSW Phone Number: 06/02/2023, 3:38 PM  Clinical Narrative:       CSW notified by MD that patient likely can transition back to [facility] tomorrow, pending neuro and psychiatry consults. CSW also notified by RN of needing clarify for patient's IVC status, paperwork has not been located. Per chart review, patient's last IVC was completed on 10/29, so would have expired on 11/5. CSW completed IVC and sent to Magistrate, IVC renewed.   Patient under active IVC. Case number [id number]. CSW to follow.            Expected Discharge Plan: Psychiatric Hospital Barriers to Discharge: Continued Medical Work up   Patient Goals and CMS Choice            Expected Discharge Plan and Services     Post Acute Care Choice: NA                                        Prior Living Arrangements/Services     Patient language and need for interpreter reviewed:: No Do you feel safe going back to the place where you live?: Yes      Need for Family Participation in Patient Care: No (Comment) Care giver support system in place?: Yes (comment)   Criminal Activity/Legal Involvement Pertinent to Current Situation/Hospitalization: No - Comment as needed  Activities of Daily Living   ADL Screening (condition at time of admission) Independently performs ADLs?: Yes (appropriate for developmental age) Is the patient deaf or have difficulty hearing?: No Does the patient have difficulty seeing, even when wearing glasses/contacts?: No Does the patient have difficulty concentrating, remembering, or making decisions?: No  Permission Sought/Granted                  Emotional Assessment           Psych Involvement: Yes (comment)  Admission diagnosis:  Seizure (HCC) [R56.9] Patient  Active Problem List   Diagnosis Date Noted   Seizure (HCC) 06/01/2023   Self-injurious behavior 04/14/2023   Iron deficiency anemia 04/12/2023   Anemia, unspecified 04/11/2023   Drug reaction 04/11/2023   Allergic reaction 04/11/2023   Constipation 04/10/2023   Arm laceration, left, subsequent encounter 04/09/2023   MDD (major depressive disorder) 04/07/2023   MDD (major depressive disorder), recurrent episode, severe (HCC) 03/16/2023   Suicide attempt by cutting of wrist (HCC) 02/28/2023   Major depressive disorder, recurrent severe without psychotic features (HCC) 02/28/2023   Oppositional defiant disorder 11/28/2020   MDD (major depressive disorder), recurrent, in partial remission (HCC) 11/11/2020   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 [place] [address] Phone: [phone] Fax: [phone]     Social Determinants of Health (SDOH) Social History: SDOH Screenings   Food Insecurity: No Food Insecurity (06/01/2023)  Housing: Patient Declined (06/01/2023)  Transportation Needs: No Transportation Needs (06/01/2023)  Recent Concern: Transportation Needs - Unmet Transportation Needs (05/07/2023)   Received from [facility]  Utilities: Not At Risk (06/01/2023)  Alcohol Screen: Low Risk  (05/25/2023)  Financial Resource Strain: Low Risk  (05/07/2023)   Received from [facility]  Social Connections: Unknown (01/16/2023)   Received from [facility]  Tobacco Use: Low Risk  (06/02/2023)   SDOH Interventions:     Readmission Risk Interventions     No data to display

## 2023-06-02 NOTE — Progress Notes (Signed)
2030 Patient arrived to unit, no admission orders or notes. Bed placement was paged and nurse was informed pt had been Admitted by MD prior to arrival and oncall service need to be called for orders. Paged on call provider and stated that Previous dr needed complete the patient admission or and admission provider needed to be assign by Bed placement. Nurse then again reached out to bed placement and updated them on the stated. Bed placement stated that she would reach out to the On call provider for basic admission orders until. The previous provider can complete the admission.  Basic orders were placed.   In had off report patient was stated to be IVC  with 1:1 sitter;however there is no notes to verify or orders. 0500 Page was sent by Change Nurse and assigned nurse to bed placement with no call back to confirm orders. Will handoff to oncoming nurse to f/u.

## 2023-06-02 NOTE — Consult Note (Signed)
NEUROLOGY CONSULT NOTE   Date of service: June 02, 2023 Patient Name: Vincent Frank MRN:  [id number] DOB:  01-20-2001 Chief Complaint: "spells concerning for seizures" Requesting Provider: Miguel Rota, MD  History of Present Illness  Vincent Frank is a 22 y.o. male with hx of MDD, seizure like activit who presented to the ED for Benadryl overdose. With his prior hx of suicide attempt, was admitted to [facility] where he had several spells of seizure like activity and transferred back to the ED. He was sent back to [facility] where he proceeded to have 4-5 more episodes and sent back to the ED. Neurology was consulted for evaluation for these spells being seizures.  Patient reports having seizure like episodes when he was a kid but they went away and then returned about 4-5 months ago. He reports fall from a height of about 14 feet while on a ladder. Had his helmet on and a tank which hit the ground first. Reports being on Keppra and that it was stopped in October when he was admitted to [facility].  Typical episode with smell of metal, lightheadedness and then loses consciousness and confused upon awakening. Did not bite his tongue, felt like he lost his bladder a few times.  On reviewing his admission at [facility] where he underwent LTM EEG, they seemed to have captured one of his events on 05/13/23 at 15:50, which is described as "patient was laying in bed on his left side of his face towards the head. Was having some slight shaking of the body and head that correlated with some motion artifact on EEG. Shaking with start and stop and when the patient was rolled over, eyes remain closed. Again, shaking started and stopped with some slight artifact seen on EEG. However, the remainder of the EEG showed a normal waking background with a well-formed posterior dominant rhythm, even with decreased responsiveness. There was no epileptiform correlate".    ROS  Comprehensive ROS performed and  pertinent positives documented in HPI  Past History   Past Medical History:  Diagnosis Date   Major depressive disorder    Seizures (HCC)     Past Surgical History:  Procedure Laterality Date   NO PAST SURGERIES      Family History: Family History  Problem Relation Age of Onset   Healthy Mother     Social History  reports that he has never smoked. He has never used smokeless tobacco. He reports that he does not drink alcohol and does not use drugs.  Allergies  Allergen Reactions   Bee Venom Anaphylaxis   Haldol [Haloperidol] Anaphylaxis    Per father dystonic reaction with  muscle contractures.   Hornet Venom Anaphylaxis and Itching   Gabapentin     twitching    Medications   Current Facility-Administered Medications:    acetaminophen (TYLENOL) tablet 650 mg, 650 mg, Oral, Q6H PRN **OR** acetaminophen (TYLENOL) suppository 650 mg, 650 mg, Rectal, Q6H PRN, Crosley, Debby, MD   alum & mag hydroxide-simeth (MAALOX/MYLANTA) 200-200-20 MG/5ML suspension 30 mL, 30 mL, Oral, Q4H PRN, Amin, Ankit C, MD   ARIPiprazole (ABILIFY) tablet 5 mg, 5 mg, Oral, QHS, Amin, Ankit C, MD, 5 mg at 06/02/23 0045   cholecalciferol (VITAMIN D3) 25 MCG (1000 UNIT) tablet 2,000 Units, 2,000 Units, Oral, Daily, Amin, Ankit C, MD, 2,000 Units at 06/01/23 2251   docusate sodium (COLACE) capsule 100 mg, 100 mg, Oral, BID, Crosley, Debby, MD, 100 mg at 06/01/23 2252   enoxaparin (  LOVENOX) injection 40 mg, 40 mg, Subcutaneous, QHS, Amin, Ankit C, MD   FLUoxetine (PROZAC) capsule 60 mg, 60 mg, Oral, Daily, Amin, Ankit C, MD, 60 mg at 06-05-23 2251   hydrOXYzine (ATARAX) tablet 25 mg, 25 mg, Oral, TID PRN, Amin, Ankit C, MD   levETIRAcetam (KEPPRA) tablet 500 mg, 500 mg, Oral, Q12H, Amin, Ankit C, MD, 500 mg at 06-05-23 2251   LORazepam (ATIVAN) injection 2 mg, 2 mg, Intravenous, Once PRN, Crosley, Debby, MD   polyethylene glycol (MIRALAX / GLYCOLAX) packet 17 g, 17 g, Oral, BID, Amin, Ankit C, MD    traZODone (DESYREL) tablet 50 mg, 50 mg, Oral, QHS, Amin, Ankit C, MD, 50 mg at 06/05/2023 2251  Vitals   Vitals:   06/05/23 2020 06/05/2023 2026 05-Jun-2023 2354 06/02/23 0410  BP: 132/73  (!) 118/58 (!) 107/56  Pulse: 68  68 (!) 56  Resp: 17  19 17   Temp: 98.1 F (36.7 C)  97.9 F (36.6 C) 98.1 F (36.7 C)  TempSrc: Oral  Oral Oral  SpO2: 94%  97% 98%  Weight:  107.6 kg    Height:  6\' 3"  (1.905 m)      Body mass index is 29.65 kg/m.  Physical Exam   Constitutional: Appears well-developed and well-nourished.  Psych: Affect appropriate to situation.  Eyes: No scleral injection.  HENT: No OP obstruction.  Head: Normocephalic.  Cardiovascular: Normal rate and regular rhythm.  Respiratory: Effort normal, non-labored breathing.  GI: Soft.  No distension. There is no tenderness.  Skin: WDI. Healed wounds from self inflicted cuts on his left wrist/forearm and BL shins.  Neurologic Examination  Mental status/Cognition: Alert, oriented to self, place, month and year, good attention.  Speech/language: Fluent, comprehension intact, object naming intact, repetition intact.  Cranial nerves:   CN II Pupils equal and reactive to light, no VF deficits    CN III,IV,VI EOM intact, no gaze preference or deviation, no nystagmus    CN V Slight paresthesia in Left temple/zygomatic arch   CN VII no asymmetry, no nasolabial fold flattening    CN VIII normal hearing to speech    CN IX & X normal palatal elevation, no uvular deviation    CN XI 5/5 head turn and 5/5 shoulder shrug bilaterally    CN XII midline tongue protrusion    Motor:  Muscle bulk: normal, tone normal, pronator drift none tremor none Mvmt Root Nerve  Muscle Right Left Comments  SA C5/6 Ax Deltoid 5 5   EF C5/6 Mc Biceps 5 5   EE C6/7/8 Rad Triceps 5 5   WF C6/7 Med FCR     WE C7/8 PIN ECU     F Ab C8/T1 U ADM/FDI 5 5   HF L1/2/3 Fem Illopsoas 5 5   KE L2/3/4 Fem Quad 5 5   DF L4/5 D Peron Tib Ant 5 5   PF S1/2 Tibial  Grc/Sol 5 5    Sensation:  Light touch Intact throughout   Pin prick    Temperature    Vibration   Proprioception    Coordination/Complex Motor:  - Finger to Nose intact BL - Heel to shin intact BL - Rapid alternating movement are normal - Gait: deferred.  Labs/Imaging/Neurodiagnostic studies   CBC:  Recent Labs  Lab Jun 05, 2023 1400 06/02/23 0501  WBC 8.4 7.8  NEUTROABS 6.4  --   HGB 12.8* 12.2*  HCT 39.3 37.1*  MCV 79.1* 77.6*  PLT 263 249    Basic  Metabolic Panel:  Lab Results  Component Value Date   NA 138 06/02/2023   K 3.6 06/02/2023   CO2 24 06/02/2023   GLUCOSE 96 06/02/2023   BUN 15 06/02/2023   CREATININE 1.36 (H) 06/02/2023   CALCIUM 9.4 06/02/2023   GFRNONAA >60 06/02/2023    Lipid Panel:  Lab Results  Component Value Date   LDLCALC 162 (H) 03/03/2023    HgbA1c:  Lab Results  Component Value Date   HGBA1C 5.2 03/03/2023    Urine Drug Screen:     Component Value Date/Time   LABOPIA NONE DETECTED 05/25/2023 0232   COCAINSCRNUR NONE DETECTED 05/25/2023 0232   LABBENZ POSITIVE (A) 05/25/2023 0232   AMPHETMU NONE DETECTED 05/25/2023 0232   THCU NONE DETECTED 05/25/2023 0232   LABBARB NONE DETECTED 05/25/2023 0232     Alcohol Level     Component Value Date/Time   ETH <10 05/24/2023 2125    INR  Lab Results  Component Value Date   INR 1.1 04/06/2023    APTT No results found for: "APTT"  AED levels: No results found for: "PHENYTOIN", "ZONISAMIDE", "LAMOTRIGINE", "LEVETIRACETA"    CT Head without contrast(Personally reviewed): CTH was negative for a large hypodensity concerning for a large territory infarct or hyperdensity concerning for an ICH.  Neurodiagnostics cEEG:  pending  Impression   Vincent Frank is a 22 y.o. male with hx of MDD, seizure like activit who presented to the ED for Benadryl overdose. Transferred for persistent episodes concerning for potential seizures.  At the request of Psychiatry team, will  have him up on LTM for spell characterization. 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  Recommendations  - LTM EEG for spell capture. Recommend photic stimulation and potential hyperventilation to see if we can provoke a spell. ______________________________________________________________________    Welton Flakes [facility]

## 2023-06-03 ENCOUNTER — Encounter (HOSPITAL_COMMUNITY): Payer: Self-pay | Admitting: Family

## 2023-06-03 ENCOUNTER — Other Ambulatory Visit: Payer: Self-pay

## 2023-06-03 ENCOUNTER — Inpatient Hospital Stay (HOSPITAL_COMMUNITY)
Admission: AD | Admit: 2023-06-03 | Discharge: 2023-06-05 | Disposition: A | Discharge status: Home / Self Care | Payer: BC Managed Care – PPO | Source: Intra-hospital | Attending: Psychiatry | Admitting: Psychiatry

## 2023-06-03 DIAGNOSIS — F411 Generalized anxiety disorder: Secondary | ICD-10-CM | POA: Diagnosis present

## 2023-06-03 DIAGNOSIS — E559 Vitamin D deficiency, unspecified: Secondary | ICD-10-CM | POA: Diagnosis present

## 2023-06-03 DIAGNOSIS — R252 Cramp and spasm: Secondary | ICD-10-CM | POA: Diagnosis present

## 2023-06-03 DIAGNOSIS — Z885 Allergy status to narcotic agent status: Secondary | ICD-10-CM

## 2023-06-03 DIAGNOSIS — Z9151 Personal history of suicidal behavior: Secondary | ICD-10-CM

## 2023-06-03 DIAGNOSIS — Z91030 Bee allergy status: Secondary | ICD-10-CM

## 2023-06-03 DIAGNOSIS — K5900 Constipation, unspecified: Secondary | ICD-10-CM | POA: Diagnosis present

## 2023-06-03 DIAGNOSIS — R569 Unspecified convulsions: Secondary | ICD-10-CM | POA: Diagnosis not present

## 2023-06-03 DIAGNOSIS — R7989 Other specified abnormal findings of blood chemistry: Secondary | ICD-10-CM | POA: Diagnosis present

## 2023-06-03 DIAGNOSIS — Z7289 Other problems related to lifestyle: Secondary | ICD-10-CM

## 2023-06-03 DIAGNOSIS — F332 Major depressive disorder, recurrent severe without psychotic features: Principal | ICD-10-CM | POA: Diagnosis present

## 2023-06-03 DIAGNOSIS — Z888 Allergy status to other drugs, medicaments and biological substances status: Secondary | ICD-10-CM

## 2023-06-03 DIAGNOSIS — Z91038 Other insect allergy status: Secondary | ICD-10-CM

## 2023-06-03 DIAGNOSIS — Z79899 Other long term (current) drug therapy: Secondary | ICD-10-CM

## 2023-06-03 DIAGNOSIS — F445 Conversion disorder with seizures or convulsions: Secondary | ICD-10-CM | POA: Diagnosis present

## 2023-06-03 DIAGNOSIS — G4700 Insomnia, unspecified: Secondary | ICD-10-CM | POA: Diagnosis present

## 2023-06-03 LAB — CBC
HCT: 39.8 % (ref 39.0–52.0)
Hemoglobin: 13.1 g/dL (ref 13.0–17.0)
MCH: 25.7 pg — ABNORMAL LOW (ref 26.0–34.0)
MCHC: 32.9 g/dL (ref 30.0–36.0)
MCV: 78 fL — ABNORMAL LOW (ref 80.0–100.0)
Platelets: 269 10*3/uL (ref 150–400)
RBC: 5.1 MIL/uL (ref 4.22–5.81)
RDW: 12.4 % (ref 11.5–15.5)
WBC: 7.5 10*3/uL (ref 4.0–10.5)
nRBC: 0 % (ref 0.0–0.2)

## 2023-06-03 LAB — BASIC METABOLIC PANEL
Anion gap: 10 (ref 5–15)
BUN: 12 mg/dL (ref 6–20)
CO2: 23 mmol/L (ref 22–32)
Calcium: 9.8 mg/dL (ref 8.9–10.3)
Chloride: 107 mmol/L (ref 98–111)
Creatinine, Ser: 1.15 mg/dL (ref 0.61–1.24)
GFR, Estimated: >60 mL/min (ref >60)
Glucose, Bld: 91 mg/dL (ref 70–99)
Potassium: 3.9 mmol/L (ref 3.5–5.1)
Sodium: 140 mmol/L (ref 135–145)

## 2023-06-03 LAB — MAGNESIUM: Magnesium: 2 mg/dL (ref 1.7–2.4)

## 2023-06-03 MED ORDER — LAMOTRIGINE 25 MG PO TABS
25.0000 mg | ORAL_TABLET | Freq: Every day | ORAL | Status: DC
  Administered 2023-06-03: 25 mg via ORAL
  Filled 2023-06-03: qty 1

## 2023-06-03 MED ORDER — DIPHENHYDRAMINE HCL 25 MG PO CAPS: 50.0000 mg | ORAL_CAPSULE | Freq: Three times a day (TID) | ORAL | Status: DC | PRN

## 2023-06-03 MED ORDER — FLUOXETINE HCL 20 MG PO CAPS
60.0000 mg | ORAL_CAPSULE | Freq: Every day | ORAL | Status: DC
Start: 2023-06-04 — End: 2023-06-05
  Administered 2023-06-04 – 2023-06-05 (×2): 60 mg via ORAL
  Filled 2023-06-03 (×3): qty 3

## 2023-06-03 MED ORDER — DIPHENHYDRAMINE HCL 50 MG/ML IJ SOLN: 50.0000 mg | Freq: Three times a day (TID) | INTRAMUSCULAR | Status: DC | PRN

## 2023-06-03 MED ORDER — HYDROXYZINE HCL 25 MG PO TABS
25.0000 mg | ORAL_TABLET | Freq: Three times a day (TID) | ORAL | Status: DC | PRN
  Filled 2023-06-03: qty 1

## 2023-06-03 MED ORDER — ACETAMINOPHEN 325 MG PO TABS: 650.0000 mg | ORAL_TABLET | Freq: Four times a day (QID) | ORAL | Status: DC | PRN

## 2023-06-03 MED ORDER — LAMOTRIGINE 25 MG PO TABS
25.0000 mg | ORAL_TABLET | Freq: Every day | ORAL | Status: DC
Start: 2023-06-04 — End: 2023-06-05
  Administered 2023-06-04 – 2023-06-05 (×2): 25 mg via ORAL
  Filled 2023-06-03 (×3): qty 1

## 2023-06-03 MED ORDER — VITAMIN D3 25 MCG PO TABS
2000.0000 [IU] | ORAL_TABLET | Freq: Every day | ORAL | Status: DC
  Administered 2023-06-04 – 2023-06-05 (×2): 2000 [IU] via ORAL
  Filled 2023-06-03 (×3): qty 2

## 2023-06-03 MED ORDER — MAGNESIUM HYDROXIDE 400 MG/5ML PO SUSP: 30.0000 mL | Freq: Every day | ORAL | Status: DC | PRN

## 2023-06-03 MED ORDER — POLYETHYLENE GLYCOL 3350 17 G PO PACK
17.0000 g | PACK | Freq: Two times a day (BID) | ORAL | Status: DC
  Filled 2023-06-03 (×6): qty 1

## 2023-06-03 MED ORDER — TRAZODONE HCL 50 MG PO TABS
50.0000 mg | ORAL_TABLET | Freq: Every day | ORAL | Status: DC
  Administered 2023-06-03: 50 mg via ORAL
  Filled 2023-06-03 (×5): qty 1

## 2023-06-03 MED ORDER — ARIPIPRAZOLE 5 MG PO TABS
5.0000 mg | ORAL_TABLET | Freq: Every day | ORAL | Status: DC
  Administered 2023-06-03 – 2023-06-04 (×2): 5 mg via ORAL
  Filled 2023-06-03 (×5): qty 1

## 2023-06-03 MED ORDER — ALUM & MAG HYDROXIDE-SIMETH 200-200-20 MG/5ML PO SUSP: 30.0000 mL | ORAL | Status: DC | PRN

## 2023-06-03 MED ORDER — DOCUSATE SODIUM 100 MG PO CAPS
100.0000 mg | ORAL_CAPSULE | Freq: Two times a day (BID) | ORAL | Status: DC
  Administered 2023-06-04 – 2023-06-05 (×2): 100 mg via ORAL
  Filled 2023-06-03 (×6): qty 1

## 2023-06-03 NOTE — Progress Notes (Signed)
Post bladder scan only 5ml

## 2023-06-03 NOTE — Hospital Course (Addendum)
  Brief Narrative:  22 y.o. male with medical history significant of hx of MDD, seizure like dx initially came to [facility] ED for suicide OD on benadryl. Reports this was accidental OD and was admitted to [facility]. While at [facility], he had several seizure type of episodes and was brought to the ED last night, and eventually sent back. But earlier today had another 4-5 episodes each last time lasting 3-5 mins.  Transferred to [facility] for LTM, neurology consulted.  Intermittent urinary retention requiring straight catheter LTM neg, will dc to [facility], ok with Neuro.      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  LTM = neg.    MDD, recurrent -Cont meds from [facility], Abilify, prozac, trazodone, Atarax.  Will request psych to reengage.   Constipation -prn bowel regimen.   Urinary retention - Requiring straight cath overnight.  UA, UDS negative. Wonder holding his urine due to his psych issues.    DVT prophylaxis:Lovenox. Code Status: Full Family Communication:   [facility] today     Subjective: Doing ok no complaints.    Examination:   General exam: Appears calm and comfortable  Respiratory system: Clear to auscultation. Respiratory effort normal. Cardiovascular system: S1 & S2 heard, RRR. No JVD, murmurs, rubs, gallops or clicks. No pedal edema. Gastrointestinal system: Abdomen is nondistended, soft and nontender. No organomegaly or masses felt. Normal bowel sounds heard. Central nervous system: Alert and oriented. No focal neurological deficits. Extremities: Symmetric 5 x 5 power. Skin: No rashes, lesions or ulcers Psychiatry: Judgement and insight appear normal. Mood & affect appropriate.

## 2023-06-03 NOTE — Discharge Summary (Signed)
Physician Discharge Summary  Vincent Frank ZOX:[id number] DOB: March 01, 2001 DOA: 06/01/2023  PCP: Pcp, No  Admit date: 06/01/2023 Discharge date: 06/03/2023  Admitted From: [facility] Disposition:  [facility]  Recommendations for Outpatient Follow-up:  Transfer back to [facility], resume psych meds    Discharge Condition: Stable CODE STATUS: Full Diet recommendation: Regular  Brief/Interim Summary:  Brief Narrative:  22 y.o. male with medical history significant of hx of MDD, seizure like dx initially came to [facility] ED for suicide OD on benadryl. Reports this was accidental OD and was admitted to [facility]. While at [facility], he had several seizure type of episodes and was brought to the ED last night, and eventually sent back. But earlier today had another 4-5 episodes each last time lasting 3-5 mins.  Transferred to [facility] for LTM, neurology consulted.  Intermittent urinary retention requiring straight catheter LTM neg, will dc to [facility], ok with Neuro.      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  LTM = neg.    MDD, recurrent -Cont meds from [facility], Abilify, prozac, trazodone, Atarax.  Will request psych to reengage.   Constipation -prn bowel regimen.   Urinary retention - Requiring straight cath overnight.  UA, UDS negative. Wonder holding his urine due to his psych issues.    DVT prophylaxis:Lovenox. Code Status: Full Family Communication:   [facility] today     Subjective: Doing ok no complaints.    Examination:   General exam: Appears calm and comfortable  Respiratory system: Clear to auscultation. Respiratory effort normal. Cardiovascular system: S1 & S2 heard, RRR. No JVD, murmurs, rubs, gallops or clicks. No pedal edema. Gastrointestinal system: Abdomen is nondistended, soft and nontender. No organomegaly or masses felt. Normal bowel sounds heard. Central nervous system: Alert and oriented.  No focal neurological deficits. Extremities: Symmetric 5 x 5 power. Skin: No rashes, lesions or ulcers Psychiatry: Judgement and insight appear normal. Mood & affect appropriate.     Discharge Diagnoses:  Principal Problem:   Seizure [facility])        Discharge Exam: Vitals:   06/03/23 0339 06/03/23 0725  BP: 119/76 (!) 101/52  Pulse: 60 60  Resp: 16 19  Temp: 98.2 F (36.8 C) 98.4 F (36.9 C)  SpO2: 98% 98%   Vitals:   06/02/23 2014 06/02/23 2355 06/03/23 0339 06/03/23 0725  BP: 120/61 124/65 119/76 (!) 101/52  Pulse: 65 65 60 60  Resp: 17 17 16 19   Temp: 98.2 F (36.8 C) 97.8 F (36.6 C) 98.2 F (36.8 C) 98.4 F (36.9 C)  TempSrc: Oral Oral Oral Oral  SpO2: 98% 98% 98% 98%  Weight:      Height:         Discharge Instructions   Allergies as of 06/03/2023       Reactions   Bee Venom Anaphylaxis   Haldol [haloperidol] Anaphylaxis   Per father dystonic reaction with  muscle contractures.   Hornet Venom Anaphylaxis, Itching   Gabapentin    twitching        Medication List     TAKE these medications    ARIPiprazole 5 MG tablet Commonly known as: ABILIFY Take 1 tablet (5 mg total) by mouth at bedtime.   FLUoxetine 20 MG capsule Commonly known as: PROZAC Take 3 capsules (60 mg total) by mouth daily.   hydrOXYzine 25 MG tablet Commonly known as: ATARAX Take 1  tablet (25 mg total) by mouth 3 (three) times daily as needed for anxiety.   levETIRAcetam 500 MG tablet Commonly known as: KEPPRA Take 1 tablet (500 mg total) by mouth every 12 (twelve) hours.   traZODone 50 MG tablet Commonly known as: DESYREL Take 1 tablet (50 mg total) by mouth at bedtime.        Allergies  Allergen Reactions   Bee Venom Anaphylaxis   Haldol [Haloperidol] Anaphylaxis    Per father dystonic reaction with  muscle contractures.   Hornet Venom Anaphylaxis and Itching   Gabapentin     twitching    You were cared for by a hospitalist during your hospital stay.  If you have any questions about your discharge medications or the care you received while you were in the hospital after you are discharged, you can call the unit and asked to speak with the hospitalist on call if the hospitalist that took care of you is not available. Once you are discharged, your primary care physician will handle any further medical issues. Please note that no refills for any discharge medications will be authorized once you are discharged, as it is imperative that you return to your primary care physician (or establish a relationship with a primary care physician if you do not have one) for your aftercare needs so that they can reassess your need for medications and monitor your lab values.  You were cared for by a hospitalist during your hospital stay. If you have any questions about your discharge medications or the care you received while you were in the hospital after you are discharged, you can call the unit and asked to speak with the hospitalist on call if the hospitalist that took care of you is not available. Once you are discharged, your primary care physician will handle any further medical issues. Please note that NO REFILLS for any discharge medications will be authorized once you are discharged, as it is imperative that you return to your primary care physician (or establish a relationship with a primary care physician if you do not have one) for your aftercare needs so that they can reassess your need for medications and monitor your lab values.  Please request your Prim.MD to go over all Hospital Tests and Procedure/Radiological results at the follow up, please get all Hospital records sent to your Prim MD by signing hospital release before you go home.  Get CBC, CMP, 2 view Chest X ray checked  by Primary MD during your next visit or SNF MD in 5-7 days ( we routinely change or add medications that can affect your baseline labs and fluid status, therefore we recommend that  you get the mentioned basic workup next visit with your PCP, your PCP may decide not to get them or add new tests based on their clinical decision)  On your next visit with your primary care physician please Get Medicines reviewed and adjusted.  If you experience worsening of your admission symptoms, develop shortness of breath, life threatening emergency, suicidal or homicidal thoughts you must seek medical attention immediately by calling 911 or calling your MD immediately  if symptoms less severe.  You Must read complete instructions/literature along with all the possible adverse reactions/side effects for all the Medicines you take and that have been prescribed to you. Take any new Medicines after you have completely understood and accpet all the possible adverse reactions/side effects.   Do not drive, operate heavy machinery, perform activities at heights, swimming or  participation in water activities or provide baby sitting services if your were admitted for syncope or siezures until you have seen by Primary MD or a Neurologist and advised to do so again.  Do not drive when taking Pain medications.   Procedures/Studies: Overnight EEG with video  Result Date: 06/02/2023 Charlsie Quest, MD     06/03/2023  8:52 AM Patient Name: Vincent Frank MRN: [id number] Epilepsy Attending: Charlsie Quest Referring Physician/Provider: Erick Blinks, MD Duration: 06/02/2023 0526 to 06/03/2023 0526 Patient history: 22 y.o. male with hx of MDD, seizure like activit who presented to the ED for Benadryl overdose. Transferred for persistent episodes concerning for potential seizures. EEG to evaluate for seizure Level of alertness: Awake, asleep AEDs during EEG study: LEV Technical aspects: This EEG study was done with scalp electrodes positioned according to the 10-20 International system of electrode placement. Electrical activity was reviewed with band pass filter of 1-70Hz , sensitivity of 7 uV/mm,  display speed of 77mm/sec with a 60Hz  notched filter applied as appropriate. EEG data were recorded continuously and digitally stored.  Video monitoring was available and reviewed as appropriate. Description: The posterior dominant rhythm consists of 8 Hz activity of moderate voltage (25-35 uV) seen predominantly in posterior head regions, symmetric and reactive to eye opening and eye closing. Sleep was characterized by vertex waves, sleep spindles (12 to 14 Hz), maximal frontocentral region. No EEG change was seen during hyperventilation.  Photic driving was not seen during photic stimulation.  One event was recorded on 06/02/2023 at 1339.  Patient was undergoing photic stimulation followed by hyperventilation.  Patient reported "feeling woozy". He then had subtle shaking in bilateral lower extremities followed by left arm twitching. Concomitant EEG before, during and after the event showed normal posterior dominant rhythm and did not show any EEG change to suggest seizure. IMPRESSION: This study is within normal limits. No seizures or epileptiform discharges were seen throughout the recording. One event was recorded on 06/02/2023 at 1339 as described above without concomitant EEG change.  This was a nonepileptic event. A normal interictal EEG does not exclude the diagnosis of epilepsy. Charlsie Quest   CT Head Wo Contrast  Result Date: 05/31/2023 CLINICAL DATA:  Altered mental status EXAM: CT HEAD WITHOUT CONTRAST TECHNIQUE: Contiguous axial images were obtained from the base of the skull through the vertex without intravenous contrast. RADIATION DOSE REDUCTION: This exam was performed according to the departmental dose-optimization program which includes automated exposure control, adjustment of the mA and/or kV according to patient size and/or use of iterative reconstruction technique. COMPARISON:  05/04/2023 FINDINGS: Brain: No evidence of acute infarction, hemorrhage, hydrocephalus, extra-axial collection  or mass lesion/mass effect. Vascular: No hyperdense vessel or unexpected calcification. Skull: Normal. Negative for fracture or focal lesion. Sinuses/Orbits: The visualized paranasal sinuses are essentially clear. The mastoid air cells are unopacified. Other: None. IMPRESSION: Normal head CT. Electronically Signed   By: Charline Bills M.D.   On: 05/31/2023 22:54   DG Chest Portable 1 View  Result Date: 05/25/2023 CLINICAL DATA:  Shortness of breath. EXAM: PORTABLE CHEST 1 VIEW COMPARISON:  Chest radiograph dated May 23, 2023 FINDINGS: Low lung volumes. The heart size appears enlarged, similar to the prior exam. The mediastinal contours are unchanged. Similar bilateral interstitial prominence. No pneumothorax or sizable pleural effusion. The visualized skeletal structures are unchanged. IMPRESSION: Low lung volumes with similar appearance of cardiac enlargement and bilateral interstitial prominence which could represent mild central vascular congestion or atypical infection. Electronically Signed  By: Hart Robinsons M.D.   On: 05/25/2023 08:49   CT Head Wo Contrast  Result Date: 05/24/2023 CLINICAL DATA:  Mental status change, unknown cause; Neck trauma, intoxicated or obtunded (Age >= 16y) EXAM: CT HEAD WITHOUT CONTRAST CT CERVICAL SPINE WITHOUT CONTRAST TECHNIQUE: Multidetector CT imaging of the head and cervical spine was performed following the standard protocol without intravenous contrast. Multiplanar CT image reconstructions of the cervical spine were also generated. RADIATION DOSE REDUCTION: This exam was performed according to the departmental dose-optimization program which includes automated exposure control, adjustment of the mA and/or kV according to patient size and/or use of iterative reconstruction technique. COMPARISON:  CT head and C-spine 05/04/2023 FINDINGS: CT HEAD FINDINGS Brain: No evidence of large-territorial acute infarction. No parenchymal hemorrhage. No mass lesion. No  extra-axial collection. No mass effect or midline shift. No hydrocephalus. Basilar cisterns are patent. Vascular: No hyperdense vessel. Skull: No acute fracture or focal lesion. Sinuses/Orbits: Paranasal sinuses and mastoid air cells are clear. The orbits are unremarkable. Other: None. CT CERVICAL SPINE FINDINGS Alignment: Normal. Skull base and vertebrae: No acute fracture. No aggressive appearing focal osseous lesion or focal pathologic process. Soft tissues and spinal canal: No prevertebral fluid or swelling. No visible canal hematoma. Upper chest: Unremarkable. Other: None. IMPRESSION: 1. No acute intracranial abnormality. 2. No acute displaced fracture or traumatic listhesis of the cervical spine. Electronically Signed   By: Tish Frederickson M.D.   On: 05/24/2023 00:03   CT Cervical Spine Wo Contrast  Result Date: 05/24/2023 CLINICAL DATA:  Mental status change, unknown cause; Neck trauma, intoxicated or obtunded (Age >= 16y) EXAM: CT HEAD WITHOUT CONTRAST CT CERVICAL SPINE WITHOUT CONTRAST TECHNIQUE: Multidetector CT imaging of the head and cervical spine was performed following the standard protocol without intravenous contrast. Multiplanar CT image reconstructions of the cervical spine were also generated. RADIATION DOSE REDUCTION: This exam was performed according to the departmental dose-optimization program which includes automated exposure control, adjustment of the mA and/or kV according to patient size and/or use of iterative reconstruction technique. COMPARISON:  CT head and C-spine 05/04/2023 FINDINGS: CT HEAD FINDINGS Brain: No evidence of large-territorial acute infarction. No parenchymal hemorrhage. No mass lesion. No extra-axial collection. No mass effect or midline shift. No hydrocephalus. Basilar cisterns are patent. Vascular: No hyperdense vessel. Skull: No acute fracture or focal lesion. Sinuses/Orbits: Paranasal sinuses and mastoid air cells are clear. The orbits are unremarkable. Other:  None. CT CERVICAL SPINE FINDINGS Alignment: Normal. Skull base and vertebrae: No acute fracture. No aggressive appearing focal osseous lesion or focal pathologic process. Soft tissues and spinal canal: No prevertebral fluid or swelling. No visible canal hematoma. Upper chest: Unremarkable. Other: None. IMPRESSION: 1. No acute intracranial abnormality. 2. No acute displaced fracture or traumatic listhesis of the cervical spine. Electronically Signed   By: Tish Frederickson M.D.   On: 05/24/2023 00:03   DG Pelvis Portable  Result Date: 05/23/2023 CLINICAL DATA:  LOC EXAM: PORTABLE PELVIS 1-2 VIEWS COMPARISON:  None Available. FINDINGS: There is no evidence of pelvic fracture or diastasis. No pelvic bone lesions are seen. IMPRESSION: Negative. Electronically Signed   By: Jasmine Pang M.D.   On: 05/23/2023 22:27   DG Chest Port 1 View  Result Date: 05/23/2023 CLINICAL DATA:  LOC EXAM: PORTABLE CHEST 1 VIEW COMPARISON:  07/23/2022 FINDINGS: The heart appears enlarged compared to prior and there is mild central vascular congestion. No acute airspace disease, pleural effusion, or pneumothorax. IMPRESSION: Interval cardiac enlargement compared to prior radiograph with  mild central congestion Electronically Signed   By: Jasmine Pang M.D.   On: 05/23/2023 22:26   CT Head Wo Contrast  Result Date: 05/04/2023 CLINICAL DATA:  Seizures.  Trauma to the head and neck. EXAM: CT HEAD WITHOUT CONTRAST CT CERVICAL SPINE WITHOUT CONTRAST TECHNIQUE: Multidetector CT imaging of the head and cervical spine was performed following the standard protocol without intravenous contrast. Multiplanar CT image reconstructions of the cervical spine were also generated. RADIATION DOSE REDUCTION: This exam was performed according to the departmental dose-optimization program which includes automated exposure control, adjustment of the mA and/or kV according to patient size and/or use of iterative reconstruction technique. COMPARISON:   04/06/2023 FINDINGS: CT HEAD FINDINGS Brain: The brain shows a normal appearance without evidence of malformation, atrophy, old or acute small or large vessel infarction, mass lesion, hemorrhage, hydrocephalus or extra-axial collection. Vascular: No hyperdense vessel. No evidence of atherosclerotic calcification. Skull: Normal.  No traumatic finding.  No focal bone lesion. Sinuses/Orbits: Sinuses are clear. Orbits appear normal. Mastoids are clear. Other: None significant CT CERVICAL SPINE FINDINGS Alignment: Normal Skull base and vertebrae: No traumatic or other focal finding. Soft tissues and spinal canal: Normal Disc levels: Normal. No degenerative change. No stenosis of the canal or foramina. Upper chest: Normal Other: None IMPRESSION: HEAD CT: Normal. CERVICAL SPINE CT: Normal. Electronically Signed   By: Paulina Fusi M.D.   On: 05/04/2023 18:08   CT Cervical Spine Wo Contrast  Result Date: 05/04/2023 CLINICAL DATA:  Seizures.  Trauma to the head and neck. EXAM: CT HEAD WITHOUT CONTRAST CT CERVICAL SPINE WITHOUT CONTRAST TECHNIQUE: Multidetector CT imaging of the head and cervical spine was performed following the standard protocol without intravenous contrast. Multiplanar CT image reconstructions of the cervical spine were also generated. RADIATION DOSE REDUCTION: This exam was performed according to the departmental dose-optimization program which includes automated exposure control, adjustment of the mA and/or kV according to patient size and/or use of iterative reconstruction technique. COMPARISON:  04/06/2023 FINDINGS: CT HEAD FINDINGS Brain: The brain shows a normal appearance without evidence of malformation, atrophy, old or acute small or large vessel infarction, mass lesion, hemorrhage, hydrocephalus or extra-axial collection. Vascular: No hyperdense vessel. No evidence of atherosclerotic calcification. Skull: Normal.  No traumatic finding.  No focal bone lesion. Sinuses/Orbits: Sinuses are clear.  Orbits appear normal. Mastoids are clear. Other: None significant CT CERVICAL SPINE FINDINGS Alignment: Normal Skull base and vertebrae: No traumatic or other focal finding. Soft tissues and spinal canal: Normal Disc levels: Normal. No degenerative change. No stenosis of the canal or foramina. Upper chest: Normal Other: None IMPRESSION: HEAD CT: Normal. CERVICAL SPINE CT: Normal. Electronically Signed   By: Paulina Fusi M.D.   On: 05/04/2023 18:08     The results of significant diagnostics from this hospitalization (including imaging, microbiology, ancillary and laboratory) are listed below for reference.     Microbiology: No results found for this or any previous visit (from the past 240 hour(s)).   Labs: BNP (last 3 results) Recent Labs    05/23/23 2050  BNP 7.4   Basic Metabolic Panel: Recent Labs  Lab 06/01/23 1400 06/02/23 0501 06/03/23 0803  NA 138 138 140  K 3.6 3.6 3.9  CL 106 108 107  CO2 23 24 23   GLUCOSE 93 96 91  BUN 19 15 12   CREATININE 1.38* 1.36* 1.15  CALCIUM 9.5 9.4 9.8  MG  --   --  2.0   Liver Function Tests: Recent Labs  Lab  06/02/23 0501  AST 24  ALT 33  ALKPHOS 67  BILITOT 0.5  PROT 6.6  ALBUMIN 3.8   No results for input(s): "LIPASE", "AMYLASE" in the last 168 hours. No results for input(s): "AMMONIA" in the last 168 hours. CBC: Recent Labs  Lab 06/01/23 1400 06/02/23 0501 06/03/23 0803  WBC 8.4 7.8 7.5  NEUTROABS 6.4  --   --   HGB 12.8* 12.2* 13.1  HCT 39.3 37.1* 39.8  MCV 79.1* 77.6* 78.0*  PLT 263 249 269   Cardiac Enzymes: Recent Labs  Lab 06/01/23 2328  CKTOTAL 280   BNP: Invalid input(s): "POCBNP" CBG: No results for input(s): "GLUCAP" in the last 168 hours. D-Dimer No results for input(s): "DDIMER" in the last 72 hours. Hgb A1c No results for input(s): "HGBA1C" in the last 72 hours. Lipid Profile No results for input(s): "CHOL", "HDL", "LDLCALC", "TRIG", "CHOLHDL", "LDLDIRECT" in the last 72 hours. Thyroid  function studies No results for input(s): "TSH", "T4TOTAL", "T3FREE", "THYROIDAB" in the last 72 hours.  Invalid input(s): "FREET3" Anemia work up No results for input(s): "VITAMINB12", "FOLATE", "FERRITIN", "TIBC", "IRON", "RETICCTPCT" in the last 72 hours. Urinalysis    Component Value Date/Time   COLORURINE YELLOW 06/02/2023 2048   APPEARANCEUR CLEAR 06/02/2023 2048   LABSPEC 1.019 06/02/2023 2048   PHURINE 6.0 06/02/2023 2048   GLUCOSEU NEGATIVE 06/02/2023 2048   HGBUR NEGATIVE 06/02/2023 2048   BILIRUBINUR NEGATIVE 06/02/2023 2048   KETONESUR NEGATIVE 06/02/2023 2048   PROTEINUR NEGATIVE 06/02/2023 2048   NITRITE NEGATIVE 06/02/2023 2048   LEUKOCYTESUR NEGATIVE 06/02/2023 2048   Sepsis Labs Recent Labs  Lab 06/01/23 1400 06/02/23 0501 06/03/23 0803  WBC 8.4 7.8 7.5   Microbiology No results found for this or any previous visit (from the past 240 hour(s)).   Time coordinating discharge:  I have spent 35 minutes face to face with the patient and on the ward discussing the patients care, assessment, plan and disposition with other care givers. >50% of the time was devoted counseling the patient about the risks and benefits of treatment/Discharge disposition and coordinating care.   SIGNED:   Miguel Rota, MD  [facility] 06/03/2023, 9:35 AM   If 7PM-7AM, please contact night-coverage

## 2023-06-03 NOTE — Progress Notes (Signed)
Writer was called into pt's room during report to evaluate pt. Patient was observed to be lying on his stomach, no respiratory distress observed. Pt did not verbally respond when staff called his name. Writer observed pt moving his hands. Pt refused vital signs to be taken. No tremors or seizure activity observed. 1:1 observation in progress. Safety maintained.

## 2023-06-03 NOTE — Progress Notes (Signed)
Informed by sitter at bedside patient had noted urinated during shift , went at bedside and instructed patient he needed to attempt to void , was in informed by mother the patient will purposely hold his urine  and even his breath in attention seeking, , patient said he would continue to try , informed after bladder scan, would notify attending and, may have to be straight cath, patient insisted on to try to void on his own, next shift made aware.

## 2023-06-03 NOTE — Progress Notes (Signed)
Patient readmitted to unit from [address] for continuation of care. Pt previously admitted to [facility] after several seizure episode on [facility] unit. Upon admission assessment, pt noted to be pleasant and cooperative. Pt denied SI/HI and AVH. Skin and safety check completed. Multiple scars from self inflicted cuts observed to pt's left forearm, left shoulder and bilateral lower extremities.No contraband found. Pt escorted onto unit by RN and orientation provided with verbalization of understanding. Plan of Care initiated. Patient c/o weakness to bilateral legs and inability to ambulate independently. 1:1 observation continue. Safety maintained.  06/03/23 1624  Psych Admission Type (Psych Patients Only)  Admission Status Involuntary  Psychosocial Assessment  Patient Complaints Anxiety;Depression  Eye Contact Fair  Facial Expression Animated  Affect Appropriate to circumstance  Speech Logical/coherent  Interaction Assertive  Motor Activity Slow  Appearance/Hygiene In scrubs  Behavior Characteristics Cooperative;Appropriate to situation  Mood Depressed;Anxious  Thought Process  Coherency WDL  Content WDL  Delusions None reported or observed  Perception WDL  Hallucination None reported or observed  Judgment Impaired  Confusion None  Danger to Self  Current suicidal ideation? Denies  Self-Injurious Behavior No self-injurious ideation or behavior indicators observed or expressed   Agreement Not to Harm Self Yes  Description of Agreement Verbal  Danger to Others  Danger to Others None reported or observed  Danger to Others Abnormal  Harmful Behavior to others No threats or harm toward other people  Destructive Behavior No threats or harm toward property

## 2023-06-03 NOTE — Progress Notes (Signed)
This RN was informed by Bonita Quin, RN during handoff that pt has not urinated throughout day shift yesterday. Bladder volume 635 cc on bladder scan. Doree Fudge, MD informed and I/O cath x 1 ordered with UA, UDS and bladder scan q6h. I/O cath output 800 cc. This morning, pt still unable to urinate even after getting up and trying a couple times. Bladder scan volume showed pt is retaining 585 cc. MD informed and instructed this RN to give a few more hours for pt to urinate spontaneously before another I/O cath. Will pass on to day team. Will continue to monitor pt.

## 2023-06-03 NOTE — Progress Notes (Addendum)
Subjective: Patient had 1 event recorded during photic stimulation and hyperventilation yesterday with normal posterior dominant rhythm.  Has been unable to void overnight.  Extensive chart review showed patterns of repeated admission to hospital for seizure-like activity.  Patient has had recent admission to [facility] for similar presentation and was diagnosed with nonepileptic events.   Keppra was discontinued during that admission.  However [facility] and [facility] was resumed.  Chart review and mother reports that patient has tendency to hold his urine and holding his breath for attention-seeking.  Patient has been following up with [facility] for multiple psychiatric diagnoses.   ROS: negative except above  Examination  Vital signs in last 24 hours: Temp:  [97.8 F (36.6 C)-98.4 F (36.9 C)] 98.4 F (36.9 C) (11/07 0725) Pulse Rate:  [60-88] 60 (11/07 0725) Resp:  [16-19] 19 (11/07 0725) BP: (101-124)/(50-76) 101/52 (11/07 0725) SpO2:  [98 %-99 %] 98 % (11/07 0725)  General: lying in bed, NAD Neuro: MS: Alert, oriented, follows commands CN: pupils equal and reactive,  EOMI, face symmetric, tongue midline, normal sensation over face, Motor: 5/5 strength in all 4 extremities Coordination: normal Gait: not tested  Basic Metabolic Panel: Recent Labs  Lab 06/01/23 1400 06/02/23 0501  NA 138 138  K 3.6 3.6  CL 106 108  CO2 23 24  GLUCOSE 93 96  BUN 19 15  CREATININE 1.38* 1.36*  CALCIUM 9.5 9.4    CBC: Recent Labs  Lab 06/01/23 1400 06/02/23 0501  WBC 8.4 7.8  NEUTROABS 6.4  --   HGB 12.8* 12.2*  HCT 39.3 37.1*  MCV 79.1* 77.6*  PLT 263 249     Coagulation Studies: No results for input(s): "LABPROT", "INR" in the last 72 hours.  Imaging personally reviewed  CT head without contrast 11//2024: No acute abnormality.  ASSESSMENT AND PLAN: 22 year old male with extensive psych history and history of  nonepileptic events who was admitted at [facility] after Benadryl overdose and was transferred to [facility] for long-term EEG monitoring for characterization of seizure-like episodes.  Nonepileptic spells ( PNES) -We recorded 1 typical episode without EEG change.  From chart review, patient has had recent admission to [facility] and was diagnosed with nonepileptic spells after long-term EEG. -Although a normal EEG does not exclude the diagnosis of epilepsy, patient's extensive history and  multiple negative EEGs during which typical events were recorded makes it highly likely that all of patient's events are nonepileptic and patient would benefit from cognitive behavioral therapy/psych admission -I am discontinuing Keppra as it can worsen irritability -I will start him on lamotrigine 25 mg daily which can be increased as tolerated to help with mood - I discussed the diagnosis of PNES with patient. He expressed understanding and is in agreement -Unfortunately even though these events are nonepileptic, unless these are completely controlled, patient will need to continue to follow seizure precautions including no driving -Discussed with Dr. Nelson Chimes via secure chat  Seizure precautions: Per [facility] statutes, patients with seizures are not allowed to drive until they have been seizure-free for six months and cleared by a physician    Use caution when using heavy equipment or power tools. Avoid working on ladders or at heights. Take showers instead of baths. Ensure the water temperature is not too high on the home water heater. Do not go swimming alone. Do not lock yourself in a room alone (i.e. bathroom). When caring for infants or small  children, sit down when holding, feeding, or changing them to minimize risk of injury to the child in the event you have a seizure. Maintain good sleep hygiene. Avoid alcohol.    If patient has another seizure, call 911 and bring them back to the ED if: A.  The  seizure lasts longer than 5 minutes.      B.  The patient doesn't wake shortly after the seizure or has new problems such as difficulty seeing, speaking or moving following the seizure C.  The patient was injured during the seizure D.  The patient has a temperature over 102 F (39C) E.  The patient vomited during the seizure and now is having trouble breathing    During the Seizure   - First, ensure adequate ventilation and place patients on the floor on their left side  Loosen clothing around the neck and ensure the airway is patent. If the patient is clenching the teeth, do not force the mouth open with any object as this can cause severe damage - Remove all items from the surrounding that can be hazardous. The patient may be oblivious to what's happening and may not even know what he or she is doing. If the patient is confused and wandering, either gently guide him/her away and block access to outside areas - Reassure the individual and be comforting - Call 911. In most cases, the seizure ends before EMS arrives. However, there are cases when seizures may last over 3 to 5 minutes. Or the individual may have developed breathing difficulties or severe injuries. If a pregnant patient or a person with diabetes develops a seizure, it is prudent to call an ambulance.     After the Seizure (Postictal Stage)   After a seizure, most patients experience confusion, fatigue, muscle pain and/or a headache. Thus, one should permit the individual to sleep. For the next few days, reassurance is essential. Being calm and helping reorient the person is also of importance.   Most seizures are painless and end spontaneously. Seizures are not harmful to others but can lead to complications such as stress on the lungs, brain and the heart. Individuals with prior lung problems may develop labored breathing and respiratory distress.   I have spent a total of   40 minutes with the patient reviewing hospital notes,   test results, labs and examining the patient as well as establishing an assessment and plan that was discussed personally with the patient.  > 50% of time was spent in direct patient care.    Lindie Spruce Epilepsy [facility] For questions after 5pm please refer to AMION to reach the Neurologist on call

## 2023-06-03 NOTE — Progress Notes (Signed)
LTM EEG discontinued - no skin breakdown at unhook.   

## 2023-06-03 NOTE — Procedures (Addendum)
Patient Name: Vincent Frank  MRN: [id number]  Epilepsy Attending: Charlsie Quest  Referring Physician/Provider: Erick Blinks, MD  Duration: 06/03/2023 0526 to 06/03/2023  5621   Patient history: 22 y.o. male with hx of MDD, seizure like activit who presented to the ED for Benadryl overdose. Transferred for persistent episodes concerning for potential seizures. EEG to evaluate for seizure   Level of alertness: Awake, asleep   AEDs during EEG study: LEV   Technical aspects: This EEG study was done with scalp electrodes positioned according to the 10-20 International system of electrode placement. Electrical activity was reviewed with band pass filter of 1-70Hz , sensitivity of 7 uV/mm, display speed of 13mm/sec with a 60Hz  notched filter applied as appropriate. EEG data were recorded continuously and digitally stored.  Video monitoring was available and reviewed as appropriate.   Description: The posterior dominant rhythm consists of 8 Hz activity of moderate voltage (25-35 uV) seen predominantly in posterior head regions, symmetric and reactive to eye opening and eye closing. Sleep was characterized by vertex waves, sleep spindles (12 to 14 Hz), maximal frontocentral region.    IMPRESSION: This study is within normal limits. No seizures or epileptiform discharges were seen throughout the recording.   A normal interictal EEG does not exclude the diagnosis of epilepsy.   Chistopher Mangino Annabelle Harman

## 2023-06-03 NOTE — TOC Transition Note (Signed)
Transition of Care [facility]) - CM/SW Discharge Note   Patient Details  Name: Vincent Frank MRN: [id number] Date of Birth: Sep 10, 2000  Transition of [facility]) CM/SW Contact:  Baldemar Lenis, LCSW Phone Number: 06/03/2023, 2:32 PM   Clinical Narrative:   CSW notified that [facility] has a bed available for patient. CSW coordinated with medical team for when patient was ready for transfer. CSW contacted [employer] to update that patient under IVC will need transportation, they will pick patient up as soon as possible. RN has already called report. No further TOC needs.    Final next level of care: [facility] Barriers to Discharge: Barriers Resolved   Patient Goals and CMS Choice      Discharge Placement                         Discharge Plan and Services Additional resources added to the After Visit Summary for       Post Acute Care Choice: NA                               Social Determinants of Health (SDOH) Interventions SDOH Screenings   Food Insecurity: No Food Insecurity (06/01/2023)  Housing: Patient Declined (06/01/2023)  Transportation Needs: No Transportation Needs (06/01/2023)  Recent Concern: Transportation Needs - Unmet Transportation Needs (05/07/2023)   Received from [facility]  Utilities: Not At Risk (06/01/2023)  Alcohol Screen: Low Risk  (05/25/2023)  Financial Resource Strain: Low Risk  (05/07/2023)   Received from [facility]  Social Connections: Unknown (01/16/2023)   Received from [facility]  Tobacco Use: Low Risk  (06/02/2023)     Readmission Risk Interventions     No data to display

## 2023-06-03 NOTE — Progress Notes (Signed)
Notified emergency contact mother of patient of transfer to [facility]

## 2023-06-03 NOTE — Progress Notes (Signed)
Writer accompanied morning shift nurse to patient's room for pt's evaluation. Patient observed lying on his stomach,  appeared stable and in good condition. No apparent distress noted. Patient's respirations were 16 and refused other vitals when writer held his hand to check his BP. Pt remains 1:1 for sz precautions. Will continue to monitor and maintain safety.

## 2023-06-03 NOTE — Plan of Care (Signed)
  Problem: Education: Goal: Knowledge of [facility] Education information/materials will improve Outcome: Progressing Goal: Emotional status will improve Outcome: Progressing Goal: Mental status will improve Outcome: Progressing Goal: Verbalization of understanding the information provided will improve Outcome: Progressing   Problem: Safety: Goal: Periods of time without injury will increase Outcome: Progressing   

## 2023-06-04 ENCOUNTER — Encounter (HOSPITAL_COMMUNITY): Payer: Self-pay

## 2023-06-04 DIAGNOSIS — F411 Generalized anxiety disorder: Secondary | ICD-10-CM | POA: Diagnosis present

## 2023-06-04 DIAGNOSIS — F332 Major depressive disorder, recurrent severe without psychotic features: Secondary | ICD-10-CM | POA: Diagnosis not present

## 2023-06-04 DIAGNOSIS — G4700 Insomnia, unspecified: Secondary | ICD-10-CM | POA: Diagnosis present

## 2023-06-04 DIAGNOSIS — R252 Cramp and spasm: Secondary | ICD-10-CM | POA: Diagnosis present

## 2023-06-04 DIAGNOSIS — R7989 Other specified abnormal findings of blood chemistry: Secondary | ICD-10-CM | POA: Diagnosis present

## 2023-06-04 NOTE — Progress Notes (Signed)
   06/04/23 0000  Psych Admission Type (Psych Patients Only)  Admission Status Involuntary  Psychosocial Assessment  Patient Complaints Anxiety;Depression  Eye Contact Fair  Facial Expression Animated  Affect Appropriate to circumstance  Speech Logical/coherent  Interaction Assertive  Motor Activity Slow  Appearance/Hygiene In scrubs  Behavior Characteristics Cooperative;Appropriate to situation  Mood Depressed;Anxious  Thought Process  Coherency WDL  Content WDL  Delusions None reported or observed  Perception WDL  Hallucination None reported or observed  Danger to Self  Current suicidal ideation? Denies  Self-Injurious Behavior No self-injurious ideation or behavior indicators observed or expressed   Agreement Not to Harm Self Yes  Description of Agreement verbal  Danger to Others  Danger to Others None reported or observed  Danger to Others Abnormal  Harmful Behavior to others No threats or harm toward other people

## 2023-06-04 NOTE — BHH Group Notes (Signed)
[facility] Group Notes:  (Nursing/MHT/Case Management/Adjunct)  Date:  06/04/2023  Time:  8:55 PM  Type of Therapy:   AA group  Participation Level:  Did Not Attend  Participation Quality:    Affect:    Cognitive:    Insight:    Engagement in Group:    Modes of Intervention:    Summary of Progress/Problems: pt didn't attend group. Writer provided pt with coping skills worksheet.   Noah Delaine 06/04/2023, 8:55 PM

## 2023-06-04 NOTE — BH IP Treatment Plan (Signed)
Interdisciplinary Treatment and Diagnostic Plan Update  06/04/2023 Time of Session: 10:30 AM  Vincent Frank MRN: [id number]  Principal Diagnosis: MDD (major depressive disorder), recurrent episode, severe (HCC)  Secondary Diagnoses: Principal Problem:   MDD (major depressive disorder), recurrent episode, severe (HCC)   Current Medications:  Current Facility-Administered Medications  Medication Dose Route Frequency Provider Last Rate Last Admin   acetaminophen (TYLENOL) tablet 650 mg  650 mg Oral Q6H PRN Starkes-Perry, Juel Burrow, FNP       alum & mag hydroxide-simeth (MAALOX/MYLANTA) 200-200-20 MG/5ML suspension 30 mL  30 mL Oral Q4H PRN Starkes-Perry, Juel Burrow, FNP       ARIPiprazole (ABILIFY) tablet 5 mg  5 mg Oral QHS Maryagnes Amos, FNP   5 mg at 06/03/23 2146   diphenhydrAMINE (BENADRYL) capsule 50 mg  50 mg Oral TID PRN Maryagnes Amos, FNP       Or   diphenhydrAMINE (BENADRYL) injection 50 mg  50 mg Intramuscular TID PRN Starkes-Perry, Juel Burrow, FNP       docusate sodium (COLACE) capsule 100 mg  100 mg Oral BID Maryagnes Amos, FNP   100 mg at 06/04/23 0919   FLUoxetine (PROZAC) capsule 60 mg  60 mg Oral Daily Maryagnes Amos, FNP   60 mg at 06/04/23 0919   hydrOXYzine (ATARAX) tablet 25 mg  25 mg Oral TID PRN Maryagnes Amos, FNP       lamoTRIgine (LAMICTAL) tablet 25 mg  25 mg Oral Daily Maryagnes Amos, FNP   25 mg at 06/04/23 0920   magnesium hydroxide (MILK OF MAGNESIA) suspension 30 mL  30 mL Oral Daily PRN Starkes-Perry, Juel Burrow, FNP       polyethylene glycol (MIRALAX / GLYCOLAX) packet 17 g  17 g Oral BID Maryagnes Amos, FNP       traZODone (DESYREL) tablet 50 mg  50 mg Oral QHS Maryagnes Amos, FNP   50 mg at 06/03/23 2143   vitamin D3 (CHOLECALCIFEROL) tablet 2,000 Units  2,000 Units Oral Daily Maryagnes Amos, FNP   2,000 Units at 06/04/23 0919   PTA Medications: Medications Prior to Admission   Medication Sig Dispense Refill Last Dose   ARIPiprazole (ABILIFY) 5 MG tablet Take 1 tablet (5 mg total) by mouth at bedtime.      FLUoxetine (PROZAC) 20 MG capsule Take 3 capsules (60 mg total) by mouth daily.      hydrOXYzine (ATARAX) 25 MG tablet Take 1 tablet (25 mg total) by mouth 3 (three) times daily as needed for anxiety.      levETIRAcetam (KEPPRA) 500 MG tablet Take 1 tablet (500 mg total) by mouth every 12 (twelve) hours.      traZODone (DESYREL) 50 MG tablet Take 1 tablet (50 mg total) by mouth at bedtime.       Patient Stressors:    Patient Strengths:    Treatment Modalities: Medication Management, Group therapy, Case management,  1 to 1 session with clinician, Psychoeducation, Recreational therapy.   Physician Treatment Plan for Primary Diagnosis: MDD (major depressive disorder), recurrent episode, severe (HCC) Long Term Goal(s):     Short Term Goals:    Medication Management: Evaluate patient's response, side effects, and tolerance of medication regimen.  Therapeutic Interventions: 1 to 1 sessions, Unit Group sessions and Medication administration.  Evaluation of Outcomes: Progressing  Physician Treatment Plan for Secondary Diagnosis: Principal Problem:   MDD (major depressive disorder), recurrent episode, severe (HCC)  Long Term Goal(s):  Short Term Goals:       Medication Management: Evaluate patient's response, side effects, and tolerance of medication regimen.  Therapeutic Interventions: 1 to 1 sessions, Unit Group sessions and Medication administration.  Evaluation of Outcomes: Progressing   RN Treatment Plan for Primary Diagnosis: MDD (major depressive disorder), recurrent episode, severe (HCC) Long Term Goal(s): Knowledge of disease and therapeutic regimen to maintain health will improve  Short Term Goals: Ability to remain free from injury will improve, Ability to verbalize frustration and anger appropriately will improve, Ability to demonstrate  self-control, Ability to participate in decision making will improve, Ability to verbalize feelings will improve, Ability to disclose and discuss suicidal ideas, Ability to identify and develop effective coping behaviors will improve, and Compliance with prescribed medications will improve  Medication Management: RN will administer medications as ordered by provider, will assess and evaluate patient's response and provide education to patient for prescribed medication. RN will report any adverse and/or side effects to prescribing provider.  Therapeutic Interventions: 1 on 1 counseling sessions, Psychoeducation, Medication administration, Evaluate responses to treatment, Monitor vital signs and CBGs as ordered, Perform/monitor CIWA, COWS, AIMS and Fall Risk screenings as ordered, Perform wound care treatments as ordered.  Evaluation of Outcomes: Progressing   LCSW Treatment Plan for Primary Diagnosis: MDD (major depressive disorder), recurrent episode, severe (HCC) Long Term Goal(s): Safe transition to appropriate next level of care at discharge, Engage patient in therapeutic group addressing interpersonal concerns.  Short Term Goals: Engage patient in aftercare planning with referrals and resources, Increase social support, Increase ability to appropriately verbalize feelings, Increase emotional regulation, Facilitate acceptance of mental health diagnosis and concerns, Facilitate patient progression through stages of change regarding substance use diagnoses and concerns, Identify triggers associated with mental health/substance abuse issues, and Increase skills for wellness and recovery  Therapeutic Interventions: Assess for all discharge needs, 1 to 1 time with Social worker, Explore available resources and support systems, Assess for adequacy in community support network, Educate family and significant other(s) on suicide prevention, Complete Psychosocial Assessment, Interpersonal group  therapy.  Evaluation of Outcomes: Progressing   Progress in Treatment: Attending groups: No. Participating in groups: No. Taking medication as prescribed: Yes. Toleration medication: Yes. Family/Significant other contact made: Yes, individual(s) contacted:  Vincent Frank (Mother) [phone])- [phone] Patient understands diagnosis: Yes. Discussing patient identified problems/goals with staff: Yes. Medical problems stabilized or resolved: Yes. Denies suicidal/homicidal ideation: Yes. Issues/concerns per patient self-inventory: No.   New problem(s) identified: No, Describe:  None reported   New Short Term/Long Term Goal(s): medication stabilization, elimination of SI thoughts, development of comprehensive mental wellness plan.    Patient Goals:  " Outpatient appointments with my psychiatrist and therapist "   Discharge Plan or Barriers: Patient recently admitted. CSW will continue to follow and assess for appropriate referrals and possible discharge planning.    Reason for Continuation of Hospitalization: Anxiety Depression Medication stabilization Suicidal ideation  Estimated Length of Stay: Doctor stated that patient may leave over the weekend   Last 3 [place] Suicide Severity Risk Score: Flowsheet Row [facility]Admission (Discharged) from 06/01/2023 in [facility] from 05/24/2023 in [facility] Department [facility]  C-SSRS RISK CATEGORY High Risk No Risk No Risk       Last PHQ 2/9 Scores:     No data to display          Scribe for Treatment Team: [employer]  Fleet Contras, LCSWA 06/04/2023 2:02 PM

## 2023-06-04 NOTE — BHH Suicide Risk Assessment (Signed)
Suicide Risk Assessment  Admission Assessment    [facility] Admission Suicide Risk Assessment   Nursing information obtained from:  Patient Demographic factors:  Male Current Mental Status:  NA Loss Factors:  NA Historical Factors:  NA Risk Reduction Factors:  NA  Total Time spent with patient: 1.5 hours Principal Problem: MDD (major depressive disorder), recurrent severe, without psychosis (HCC) Diagnosis:  Principal Problem:   MDD (major depressive disorder), recurrent severe, without psychosis (HCC) Active Problems:   Constipation   Self-injurious behavior   GAD (generalized anxiety disorder)   Low vitamin D level   Insomnia   Jerking movements of extremities  Subjective Data: Depressive symptoms   Continued Clinical Symptoms: Symptoms continue to require inpatient hospitalization, and stabilization.  This is in disorder symptoms stable enough to be managed on the outside of the hospital.  Alcohol Use Disorder Identification Test Final Score (AUDIT): 0 The "Alcohol Use Disorders Identification Test", Guidelines for Use in Primary Care, Second Edition.  World Science writer [facility]). Score between 0-7:  no or low risk or alcohol related problems. Score between 8-15:  moderate risk of alcohol related problems. Score between 16-19:  high risk of alcohol related problems. Score 20 or above:  warrants further diagnostic evaluation for alcohol dependence and treatment.  CLINICAL FACTORS:   Depression:   Anhedonia Previous Psychiatric Diagnoses and Treatments  Musculoskeletal: Strength & Muscle Tone: within normal limits Gait & Station: normal Patient leans: N/A  Psychiatric Specialty Exam:  Presentation  General Appearance:  Casual  Eye Contact: Fair  Speech: Clear and Coherent  Speech Volume: Normal  Handedness: Right   Mood and Affect  Mood: Depressed; Anxious  Affect: Congruent   Thought Process  Thought Processes: Coherent  Descriptions of  Associations:Intact  Orientation:Full (Time, Place and Person)  Thought Content:Logical  History of Schizophrenia/Schizoaffective disorder:No  Duration of Psychotic Symptoms:No data recorded Hallucinations:No data recorded Ideas of Reference:None  Suicidal Thoughts:Suicidal Thoughts: No  Homicidal Thoughts:Homicidal Thoughts: No   Sensorium  Memory: Immediate Good  Judgment: Fair  Insight: Fair   Executive Functions  Concentration: Good  Attention Span: Good  Recall: Fair  Fund of Knowledge: Fair  Language: Good   Psychomotor Activity  Psychomotor Activity: Psychomotor Activity: Normal   Assets  Assets: Communication Skills; Resilience; Housing; Social Support   Sleep  Sleep: Sleep: Good   Physical Exam: Physical Exam Review of Systems  Psychiatric/Behavioral:  Positive for depression and hallucinations. Negative for memory loss, substance abuse and suicidal ideas. The patient is nervous/anxious and has insomnia.    Blood pressure 110/67, pulse 86, temperature 97.7 F (36.5 C), temperature source Oral, resp. rate 18, height 6\' 3"  (1.905 m), weight 108 kg, SpO2 99%. Body mass index is 29.76 kg/m.   COGNITIVE FEATURES THAT CONTRIBUTE TO RISK:  None    SUICIDE RISK:   Severe:  Frequent, intense, and enduring suicidal ideation, specific plan, no subjective intent, but some objective markers of intent (i.e., choice of lethal method), the method is accessible, some limited preparatory behavior, evidence of impaired self-control, severe dysphoria/symptomatology, multiple risk factors present, and few if any protective factors, particularly a lack of social support.  PLAN OF CARE: See H & P  I certify that inpatient services furnished can reasonably be expected to improve the patient's condition.   Starleen Blue, NP 06/04/2023, 5:29 PM

## 2023-06-04 NOTE — Plan of Care (Signed)
  Problem: Education: Goal: Knowledge of [facility] Education information/materials will improve 06/04/2023 1412 by Melvenia Needles, RN Outcome: Progressing 06/04/2023 1408 by Melvenia Needles, RN Outcome: Progressing Goal: Emotional status will improve 06/04/2023 1412 by Melvenia Needles, RN Outcome: Progressing 06/04/2023 1408 by Melvenia Needles, RN Outcome: Progressing Goal: Mental status will improve 06/04/2023 1412 by Melvenia Needles, RN Outcome: Progressing 06/04/2023 1408 by Melvenia Needles, RN Outcome: Progressing Goal: Verbalization of understanding the information provided will improve 06/04/2023 1412 by Melvenia Needles, RN Outcome: Progressing 06/04/2023 1408 by Melvenia Needles, RN Outcome: Progressing   Problem: Education: Goal: Emotional status will improve 06/04/2023 1412 by Melvenia Needles, RN Outcome: Progressing 06/04/2023 1408 by Melvenia Needles, RN Outcome: Progressing

## 2023-06-04 NOTE — Plan of Care (Signed)
  Problem: Coping: Goal: Ability to verbalize frustrations and anger appropriately will improve Outcome: Progressing Goal: Ability to demonstrate self-control will improve Outcome: Progressing   Problem: Health Behavior/Discharge Planning: Goal: Identification of resources available to assist in meeting health care needs will improve Outcome: Progressing Goal: Compliance with treatment plan for underlying cause of condition will improve Outcome: Progressing   
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
## 2023-06-04 NOTE — Progress Notes (Signed)
   06/04/23 0800  Psych Admission Type (Psych Patients Only)  Admission Status Involuntary  Psychosocial Assessment  Patient Complaints Anxiety;Depression;Other (Comment)  Eye Contact Brief  Facial Expression Animated  Affect Appropriate to circumstance  Speech Logical/coherent  Interaction Assertive  Motor Activity Slow  Appearance/Hygiene In scrubs  Behavior Characteristics Cooperative;Appropriate to situation  Mood Depressed;Anxious  Thought Process  Coherency WDL  Content WDL  Delusions None reported or observed  Perception WDL  Hallucination None reported or observed  Danger to Self  Current suicidal ideation? Denies  Self-Injurious Behavior No self-injurious ideation or behavior indicators observed or expressed   Agreement Not to Harm Self Yes  Description of Agreement verbal  Danger to Others  Danger to Others None reported or observed  Danger to Others Abnormal  Harmful Behavior to others No threats or harm toward other people  Destructive Behavior No threats or harm toward property

## 2023-06-05 DIAGNOSIS — F332 Major depressive disorder, recurrent severe without psychotic features: Secondary | ICD-10-CM | POA: Diagnosis not present

## 2023-06-05 MED ORDER — ARIPIPRAZOLE 5 MG PO TABS: 5.0000 mg | ORAL_TABLET | Freq: Every day | ORAL | 0 refills | Status: DC

## 2023-06-05 MED ORDER — HYDROXYZINE HCL 25 MG PO TABS: 25.0000 mg | ORAL_TABLET | Freq: Three times a day (TID) | ORAL | 0 refills | Status: DC | PRN

## 2023-06-05 MED ORDER — FLUOXETINE HCL 20 MG PO CAPS: 60.0000 mg | ORAL_CAPSULE | Freq: Every day | ORAL | 0 refills | Status: AC

## 2023-06-05 MED ORDER — LAMOTRIGINE 25 MG PO TABS: 25.0000 mg | ORAL_TABLET | Freq: Every day | ORAL | 0 refills | Status: DC

## 2023-06-05 MED ORDER — TRAZODONE HCL 50 MG PO TABS: 50.0000 mg | ORAL_TABLET | Freq: Every day | ORAL | 0 refills | Status: DC

## 2023-06-05 NOTE — [facility] Suicide Risk Assessment (Signed)
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 His phone was pinged, after which his location was found, & it took EMS a couple of hours to get him out & take him to the [facility] ER on 10/28.  After his arrival here at [facility], pt was sent out to the ER on 11/5 for seizure like activity. He was medically worked up & there was no evidence of a seizure activity for >24 hrs that he was being monitored, and he was sent back to [facility] for continuity of mental health treatment and stabilization. Pt is on 1:1 monitoring for his safety.   During the patient's hospitalization, patient had extensive initial psychiatric evaluation, and follow-up psychiatric evaluations every day.   Psychiatric diagnoses provided upon initial assessment:  MDD GAD Pseudoseizure   Patient's psychiatric medications were adjusted on admission:  -Continue prozac 60 mg every day -continue abilify 5 mg every day  -continue lamotrigine 25 mg every day that was started in the hospital by neurology    During the hospitalization, other adjustments were made to the patient's psychiatric medication regimen: none   Patient's care was discussed during the interdisciplinary team meeting every day during the hospitalization.   The patient denied having side effects to prescribed psychiatric medication.   Gradually, patient  started adjusting to milieu. The patient was evaluated each day by a clinical provider to ascertain response to treatment. Improvement was noted by the patient's report of decreasing symptoms, improved sleep and appetite, affect, medication tolerance, behavior, and participation in unit programming.  Patient was asked each day to complete a self inventory noting mood, mental status, pain, new symptoms, anxiety and concerns.     Symptoms were reported as significantly decreased or resolved completely by discharge.    On day of discharge, the patient reports that their mood is stable. The patient denied having suicidal thoughts for more than 48 hours prior to discharge.  Patient denies having homicidal thoughts.  Patient denies having auditory hallucinations.  Patient denies any visual hallucinations or other symptoms of psychosis. The patient was motivated to continue taking medication with a goal of continued improvement in mental health.    The patient reports their target psychiatric symptoms of mood instability, depression, and suicidal thoughts, all responded well to the psychiatric medications, and the patient reports overall benefit other psychiatric hospitalization. Supportive psychotherapy was provided to the patient. The patient also participated in regular group therapy while hospitalized. Coping skills, problem solving as well as relaxation therapies were also part of the unit programming.   Labs were reviewed with the patient, and abnormal results were discussed with the patient.   The patient is able to verbalize their individual safety plan to this provider.   # It is recommended to the patient to continue psychiatric medications as prescribed, after discharge from the hospital.     # It is recommended to the patient to follow up with your outpatient psychiatric provider and PCP.   #  It was discussed with the patient, the impact of alcohol, drugs, tobacco have been there overall  psychiatric and medical wellbeing, and total abstinence from substance use was recommended the patient.ed.   # Prescriptions provided or sent directly to preferred pharmacy at discharge. Patient agreeable to plan. Given opportunity to ask questions. Appears to feel comfortable with discharge.    # In the event of worsening symptoms, the patient is instructed to call the crisis hotline, 911 and or go to the nearest ED for appropriate evaluation and treatment of symptoms. To follow-up with primary care provider for other medical issues, concerns and or health care needs   # Patient was discharged to care of mother, with a plan to follow up as noted below.   Psychiatric Specialty Exam  Presentation  General Appearance:  Casual; Fairly Groomed; Appropriate for Environment  Eye Contact: Good  Speech: Normal Rate; Clear and Coherent  Speech Volume: Normal  Handedness: Right   Mood and Affect  Mood: Euthymic  Duration of Depression Symptoms: Greater than two weeks  Affect: Appropriate; Congruent; Full Range   Thought Process  Thought Processes: Linear  Descriptions of Associations:Intact  Orientation:Full (Time, Place and Person)  Thought Content:Logical  History of Schizophrenia/Schizoaffective disorder:No  Duration of Psychotic Symptoms:No data recorded Hallucinations:Hallucinations: None  Ideas of Reference:None  Suicidal Thoughts:Suicidal Thoughts: No  Homicidal Thoughts:Homicidal Thoughts: No   Sensorium  Memory: Immediate Good; Recent Good; Remote Good  Judgment: Fair  Insight: Fair   Art therapist  Concentration: Fair  Attention Span: Fair  Recall: Good  Fund of Knowledge: Good  Language: Good   Psychomotor Activity  Psychomotor Activity: Psychomotor Activity: Normal   Assets  Assets: Communication Skills; Resilience; Housing; Social Support   Sleep  Sleep: Sleep: Fair   Physical Exam: Physical Exam See  discharge summary  ROS See discharge summary   Blood pressure 135/82, pulse 87, temperature 97.7 F (36.5 C), temperature source Oral, resp. rate 18, height 6\' 3"  (1.905 m), weight 108 kg, SpO2 99%. Body mass index is 29.76 kg/m.  Mental Status Per Nursing Assessment::   On Admission:  NA  Demographic Factors:  Male  Loss Factors: Decrease in vocational status  Historical Factors: Prior suicide attempts  Risk Reduction Factors:   Living with another person, especially a relative, Positive therapeutic relationship, and Positive coping skills or problem solving skills  Continued Clinical Symptoms:  Mood is stable. Anxiety at a manageable level. Denying any SI including passive SI.    Cognitive Features That Contribute To Risk:  None    Suicide Risk:  Mild:  There are no identifiable suicide plans, no associated intent, mild dysphoria and related symptoms, good self-control (both objective and subjective assessment), few other risk factors, and identifiable protective factors, including available and accessible social support.    Follow-up Information     [facility] Call on 06/07/2023.   Contact information: [address] [place] [address] [phone]                 Plan Of Care/Follow-up recommendations:   Activity: as tolerated  Diet: heart healthy  Other: -Follow-up with your outpatient psychiatric provider -instructions on appointment date, time, and address (location) are provided to you in discharge paperwork.  -Take your psychiatric medications as prescribed at discharge - instructions are provided to you in the discharge paperwork  -Follow-up with outpatient primary care doctor and other specialists -for management of preventative medicine and chronic medical disease  -If you are prescribed an  atypical antipsychotic medication, we recommend that your outpatient psychiatrist follow routine screening for side effects within  3 months of discharge, including monitoring: AIMS scale, height, weight, blood pressure, fasting lipid panel, HbA1c, and fasting blood sugar.   -Recommend total abstinence from alcohol, tobacco, and other illicit drug use at discharge.   -If your psychiatric symptoms recur, worsen, or if you have side effects to your psychiatric medications, call your outpatient psychiatric provider, 911, [phone] or go to the nearest emergency department.  -If suicidal thoughts occur, immediately call your outpatient psychiatric provider, 911, [phone] or go to the nearest emergency department.   Phineas Inches, MD 06/05/2023, 9:55 AM

## 2023-06-05 NOTE — Progress Notes (Signed)
Pt is alert and oriented X4. Affect bright /mood congruent. Pt denies anxiety/depression and pain. No SI/HI/AVH  at this time. Pt slept in the room all evening, with his sitter. Pt did not attend group.   Scheduled medications administered to patient per MD orders. Support and encouragement provided. Routine safety checks conducted every 15 minutes without incident. Patient informed to notify staff with problems or concerns and verbalize understanding.   No adverse  drug reaction noted. Pt is compliant with medications and treatment plan. Pt is receptive, calm, cooperative. Pt contracts for safety and remains safe on the unit at this time

## 2023-06-05 NOTE — [facility] Suicide Risk Assessment (Signed)
[facility] INPATIENT:  Family/Significant Other Suicide Prevention Education  Suicide Prevention Education:  Education Completed; Joseff Forseth (Mother) [phone]                      has been identified by the patient as the family member/significant other with whom the patient will be residing, and identified as the person(s) who will aid the patient in the event of a mental health crisis (suicidal ideations/suicide attempt).  With written consent from the patient, the family member/significant other has been provided the following suicide prevention education, prior to the and/or following the discharge of the patient.  The suicide prevention education provided includes the following: Suicide risk factors Suicide prevention and interventions National Suicide Hotline telephone number [facility] assessment telephone number [facility] Emergency Assistance 911 [facility] and/or Residential Mobile Crisis Unit telephone number  Request made of family/significant other to: Remove weapons (e.g., guns, rifles, knives), all items previously/currently identified as safety concern.   Remove drugs/medications (over-the-counter, prescriptions, illicit drugs), all items previously/currently identified as a safety concern.  The family member/significant other verbalizes understanding of the suicide prevention education information provided.  The family member/significant other agrees to remove the items of safety concern listed above.  ..spe  Steffanie Dunn LCSWA 06/05/2023, 9:22 AM

## 2023-06-05 NOTE — Progress Notes (Signed)
  [facility] Adult Case Management Discharge Plan :  Will you be returning to the same living situation after discharge:  Yes, Pt will be returning with mom At discharge, do you have transportation home?: No. CSW will coordinate transportation for pt.  Do you have the ability to pay for your medications: Yes,    Pt has [employer] / [facility] PPO  Release of information consent forms completed and in the chart;  Patient's signature needed at discharge.  Patient to Follow up at: Dr. Orlean Patten [facility]) - unable to contact, added to f/u for pt. to schedule since already established.                                                 C - [facility] [place] [phone]   Next level of care provider has access to [facility] Link:no  Safety Planning and Suicide Prevention discussed: Yes, Discussed with pt and parent. Pt has agreed to participate in therapy as required to live at home with parents  Has patient been referred to the Quitline?: Patient does not use tobacco/nicotine products  Patient has been referred for addiction treatment: No known substance use disorder.  Steffanie Dunn, LCSWA 06/05/2023, 9:25 AM

## 2023-06-05 NOTE — Discharge Instructions (Signed)

## 2023-06-05 NOTE — Progress Notes (Signed)
Patient verbalizes readiness for discharge. All patient belongings returned to patient. Discharge instructions read and discussed with patient (appointments, medications, resources). Patient expressed gratitude for care provided. Patient discharged to lobby at 1040 where taxi was waiting.
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
## 2023-06-05 NOTE — Progress Notes (Signed)
   06/05/23 0600  15 Minute Checks  Location Bedroom  Visual Appearance Calm  Behavior Sleeping

## 2023-06-05 NOTE — Progress Notes (Signed)
1:1 note  Patient observed in the hallway with 1:1 staff close by. Safety maintained.

## 2023-06-05 NOTE — Plan of Care (Signed)

## 2023-06-08 ENCOUNTER — Other Ambulatory Visit: Payer: Self-pay

## 2023-06-08 ENCOUNTER — Encounter (HOSPITAL_COMMUNITY): Payer: Self-pay

## 2023-06-08 ENCOUNTER — Emergency Department (HOSPITAL_COMMUNITY): Payer: BC Managed Care – PPO

## 2023-06-08 ENCOUNTER — Emergency Department (HOSPITAL_COMMUNITY)
Admission: EM | Admit: 2023-06-08 | Discharge: 2023-06-09 | Disposition: A | Discharge status: Home / Self Care | Payer: BC Managed Care – PPO | Attending: Emergency Medicine | Admitting: Emergency Medicine

## 2023-06-08 DIAGNOSIS — T39312A Poisoning by propionic acid derivatives, intentional self-harm, initial encounter: Secondary | ICD-10-CM | POA: Diagnosis not present

## 2023-06-08 DIAGNOSIS — R000 Tachycardia, unspecified: Secondary | ICD-10-CM | POA: Diagnosis not present

## 2023-06-08 DIAGNOSIS — X789XXA Intentional self-harm by unspecified sharp object, initial encounter: Secondary | ICD-10-CM | POA: Insufficient documentation

## 2023-06-08 DIAGNOSIS — S51812A Laceration without foreign body of left forearm, initial encounter: Secondary | ICD-10-CM | POA: Diagnosis not present

## 2023-06-08 DIAGNOSIS — T1491XA Suicide attempt, initial encounter: Secondary | ICD-10-CM

## 2023-06-08 DIAGNOSIS — R109 Unspecified abdominal pain: Secondary | ICD-10-CM | POA: Insufficient documentation

## 2023-06-08 DIAGNOSIS — S59912A Unspecified injury of left forearm, initial encounter: Secondary | ICD-10-CM | POA: Diagnosis present

## 2023-06-08 DIAGNOSIS — R45851 Suicidal ideations: Secondary | ICD-10-CM | POA: Diagnosis not present

## 2023-06-08 LAB — CBC WITH DIFFERENTIAL/PLATELET
Abs Immature Granulocytes: 0.04 10*3/uL (ref 0.00–0.07)
Basophils Absolute: 0.1 10*3/uL (ref 0.0–0.1)
Basophils Relative: 1 %
Eosinophils Absolute: 0.2 10*3/uL (ref 0.0–0.5)
Eosinophils Relative: 2 %
HCT: 37.7 % — ABNORMAL LOW (ref 39.0–52.0)
Hemoglobin: 12.9 g/dL — ABNORMAL LOW (ref 13.0–17.0)
Immature Granulocytes: 0 %
Lymphocytes Relative: 11 %
Lymphs Abs: 1.2 10*3/uL (ref 0.7–4.0)
MCH: 26.2 pg (ref 26.0–34.0)
MCHC: 34.2 g/dL (ref 30.0–36.0)
MCV: 76.6 fL — ABNORMAL LOW (ref 80.0–100.0)
Monocytes Absolute: 0.8 10*3/uL (ref 0.1–1.0)
Monocytes Relative: 7 %
Neutro Abs: 8.5 10*3/uL — ABNORMAL HIGH (ref 1.7–7.7)
Neutrophils Relative %: 79 %
Platelets: 256 10*3/uL (ref 150–400)
RBC: 4.92 MIL/uL (ref 4.22–5.81)
RDW: 12.2 % (ref 11.5–15.5)
WBC: 10.8 10*3/uL — ABNORMAL HIGH (ref 4.0–10.5)
nRBC: 0 % (ref 0.0–0.2)

## 2023-06-08 LAB — I-STAT CHEM 8, ED
BUN: 12 mg/dL (ref 6–20)
Calcium, Ion: 1.2 mmol/L (ref 1.15–1.40)
Chloride: 105 mmol/L (ref 98–111)
Creatinine, Ser: 1.3 mg/dL — ABNORMAL HIGH (ref 0.61–1.24)
Glucose, Bld: 103 mg/dL — ABNORMAL HIGH (ref 70–99)
HCT: 39 % (ref 39.0–52.0)
Hemoglobin: 13.3 g/dL (ref 13.0–17.0)
Potassium: 3.4 mmol/L — ABNORMAL LOW (ref 3.5–5.1)
Sodium: 140 mmol/L (ref 135–145)
TCO2: 21 mmol/L — ABNORMAL LOW (ref 22–32)

## 2023-06-08 MED ORDER — SODIUM CHLORIDE 0.9 % IV SOLN
INTRAVENOUS | Status: DC
  Administered 2023-06-09: 1000 mL via INTRAVENOUS

## 2023-06-08 MED ORDER — LIDOCAINE-EPINEPHRINE (PF) 2 %-1:200000 IJ SOLN
20.0000 mL | Freq: Once | INTRAMUSCULAR | Status: AC
  Administered 2023-06-09: 20 mL via INTRADERMAL
  Filled 2023-06-08: qty 20

## 2023-06-08 MED ORDER — SODIUM CHLORIDE 0.9 % IV BOLUS
1000.0000 mL | Freq: Once | INTRAVENOUS | Status: AC
  Administered 2023-06-09: 1000 mL via INTRAVENOUS

## 2023-06-08 NOTE — ED Notes (Signed)
Pt changed into burgundy scrubs and his personal belongings and clothes were placed in patient belonging bag and removed from room.

## 2023-06-08 NOTE — ED Provider Notes (Signed)
[place] [facility] Provider Note  CSN: [id number] Arrival date & time: 06/08/23 2211  Chief Complaint(s) Suicide Attempt (/)  HPI Vincent Frank is a 22 y.o. male with a past medical history listed below including major depression disorder and prior suicide attempts here for overdosing on naproxen.  Patient reports taking the entire bottle of over-the-counter naproxen.  He estimates 50 pills.  Additionally he took the rest of his Keppra stating that it was around 3 to 4 pills.  He also take his 1 dose of lamotrigine tonight.  He did this around 9 PM.  Patient also cut his forearm in multiple locations.   He is complaining of severe abdominal pain on going prior to the ingestion, but worse afterwards.  The history is provided by the patient.    Past Medical History Past Medical History:  Diagnosis Date   Major depressive disorder    Seizures (HCC)    Patient Active Problem List   Diagnosis Date Noted   GAD (generalized anxiety disorder) 06/04/2023   Seizure (HCC) 06/01/2023   Iron deficiency anemia 04/12/2023   Anemia, unspecified 04/11/2023   Drug reaction 04/11/2023   Allergic reaction 04/11/2023   Arm laceration, left, subsequent encounter 04/09/2023   MDD (major depressive disorder) 04/07/2023   Suicide attempt by cutting of wrist (HCC) 02/28/2023   MDD (major depressive disorder), recurrent severe, without psychosis (HCC) 02/28/2023   Oppositional defiant disorder 11/28/2020   MDD (major depressive disorder), recurrent, in partial remission (HCC) 11/11/2020   History of factitious disorder 11/11/2020   Homicidal ideation    Suicidal ideation    Apnea 10/11/2020   Tremor 10/09/2020   Abdominal pain, suprapubic 10/08/2020   Acute urinary retention 10/08/2020   Depression 08/31/2020   Home Medication(s) Prior to Admission medications   Medication Sig Start Date End Date Taking? Authorizing Provider  ARIPiprazole (ABILIFY)  5 MG tablet Take 1 tablet (5 mg total) by mouth at bedtime for 14 days. 06/05/23 06/19/23  Massengill, [name], MD  FLUoxetine (PROZAC) 20 MG capsule Take 3 capsules (60 mg total) by mouth daily for 14 days. 06/06/23 06/20/23  Massengill, [name], MD  hydrOXYzine (ATARAX) 25 MG tablet Take 1 tablet (25 mg total) by mouth 3 (three) times daily as needed for anxiety. 06/05/23   Massengill, [name], MD  lamoTRIgine (LAMICTAL) 25 MG tablet Take 1 tablet (25 mg total) by mouth daily for 14 days. 06/06/23 06/20/23  Massengill, [name], MD  traZODone (DESYREL) 50 MG tablet Take 1 tablet (50 mg total) by mouth at bedtime. 06/05/23   Massengill, [name], MD                                                                                                                                    Allergies Bee venom, Haldol [haloperidol], Hornet venom, and Gabapentin  Review of Systems Review of Systems As noted in HPI  Physical Exam  Vital Signs  I have reviewed the triage vital signs BP (!) 151/88   Pulse 96   Temp 98.3 F (36.8 C) (Oral)   Resp (!) 23   Ht 6\' 3"  (1.905 m)   Wt 108 kg   SpO2 98%   BMI 29.76 kg/m   Physical Exam Vitals reviewed.  Constitutional:      General: He is not in acute distress.    Appearance: He is well-developed. He is not diaphoretic.  HENT:     Head: Normocephalic and atraumatic.     Nose: Nose normal.  Eyes:     General: No scleral icterus.       Right eye: No discharge.        Left eye: No discharge.     Conjunctiva/sclera: Conjunctivae normal.     Pupils: Pupils are equal, round, and reactive to light.  Cardiovascular:     Rate and Rhythm: Regular rhythm. Tachycardia present.     Heart sounds: No murmur heard.    No friction rub. No gallop.  Pulmonary:     Effort: Pulmonary effort is normal. No respiratory distress.     Breath sounds: Normal breath sounds. No stridor. No rales.  Abdominal:     General: There is no distension.     Palpations: Abdomen is soft.      Tenderness: There is abdominal tenderness.  Musculoskeletal:        General: No tenderness.     Left forearm: Laceration present.     Cervical back: Normal range of motion and neck supple.     Comments: Forearm lacerations: 1 - approx 5 cm long x 3 mm deep 2 - approx 5 cm long x 2 mm deep 3 - approx 8 cm long x 3 mm deep  Skin:    General: Skin is warm and dry.     Findings: No erythema or rash.  Neurological:     Mental Status: He is alert and oriented to person, place, and time.     ED Results and Treatments Labs (all labs ordered are listed, but only abnormal results are displayed) Labs Reviewed  COMPREHENSIVE METABOLIC PANEL - Abnormal; Notable for the following components:      Result Value   Potassium 3.3 (*)    CO2 20 (*)    Glucose, Bld 103 (*)    All other components within normal limits  SALICYLATE LEVEL - Abnormal; Notable for the following components:   Salicylate Lvl <7.0 (*)    All other components within normal limits  ACETAMINOPHEN LEVEL - Abnormal; Notable for the following components:   Acetaminophen (Tylenol), Serum <10 (*)    All other components within normal limits  CBC WITH DIFFERENTIAL/PLATELET - Abnormal; Notable for the following components:   WBC 10.8 (*)    Hemoglobin 12.9 (*)    HCT 37.7 (*)    MCV 76.6 (*)    Neutro Abs 8.5 (*)    All other components within normal limits  BLOOD GAS, VENOUS - Abnormal; Notable for the following components:   pH, Ven 7.44 (*)    pCO2, Ven 39 (*)    pO2, Ven 64 (*)    Acid-Base Excess 2.3 (*)    All other components within normal limits  ACETAMINOPHEN LEVEL - Abnormal; Notable for the following components:   Acetaminophen (Tylenol), Serum <10 (*)    All other components within normal limits  SALICYLATE LEVEL - Abnormal; Notable for the following components:   Salicylate Lvl <7.0 (*)  All other components within normal limits  I-STAT CHEM 8, ED - Abnormal; Notable for the following components:    Potassium 3.4 (*)    Creatinine, Ser 1.30 (*)    Glucose, Bld 103 (*)    TCO2 21 (*)    All other components within normal limits  ETHANOL  LIPASE, BLOOD  RAPID URINE DRUG SCREEN, HOSP PERFORMED  CBG MONITORING, ED  I-STAT VENOUS BLOOD GAS, ED  I-STAT VENOUS BLOOD GAS, ED                                                                                                                         EKG  EKG Interpretation Date/Time:  Wednesday June 09 2023 05:59:04 EST Ventricular Rate:  91 PR Interval:  185 QRS Duration:  109 QT Interval:  319 QTC Calculation: 393 R Axis:   69  Text Interpretation: Sinus rhythm RSR' in V1 or V2, probably normal variant Borderline T wave abnormalities Confirmed by Drema Pry [phone]) on 06/09/2023 6:05:08 AM       Radiology CT ABDOMEN PELVIS W CONTRAST  Result Date: 06/09/2023 CLINICAL DATA:  Ingestion. Suicidal ideation. Pt was at [facility] when pt texted EMS for them to pick him up. Per EMS, pt has 2-3 laceration on left wrist. Pt told EMS that he took over 6000 mg of Naproxen, 3-4 Keppra. Lower abdominal pain, wbc's 10.8. EXAM: CT ABDOMEN AND PELVIS WITH CONTRAST TECHNIQUE: Multidetector CT imaging of the abdomen and pelvis was performed using the standard protocol following bolus administration of intravenous contrast. RADIATION DOSE REDUCTION: This exam was performed according to the departmental dose-optimization program which includes automated exposure control, adjustment of the mA and/or kV according to patient size and/or use of iterative reconstruction technique. CONTRAST:  OMNIPAQUE IOHEXOL 300 MG/ML  SOLN COMPARISON:  None Available. FINDINGS: Lower chest: No acute abnormality. Hepatobiliary: No focal liver abnormality. Contracted gallbladder. No gallstones, gallbladder wall thickening, or pericholecystic fluid. No biliary dilatation. Pancreas: No focal lesion. Normal pancreatic contour. No surrounding inflammatory changes.  No main pancreatic ductal dilatation. Spleen: Normal in size without focal abnormality. Adrenals/Urinary Tract: No adrenal nodule bilaterally. Bilateral kidneys enhance symmetrically. No hydronephrosis. No hydroureter. The urinary bladder is unremarkable. Stomach/Bowel: Stomach is within normal limits. No evidence of bowel wall thickening or dilatation. Appendix appears normal. Vascular/Lymphatic: No abdominal aorta or iliac aneurysm. No abdominal, pelvic, or inguinal lymphadenopathy. Reproductive: Prostate is unremarkable. Other: No intraperitoneal free fluid. No intraperitoneal free gas. No organized fluid collection. Musculoskeletal: No abdominal wall hernia or abnormality. No suspicious lytic or blastic osseous lesions. No acute displaced fracture. IMPRESSION: No acute intra-abdominal or intrapelvic abnormality. Electronically Signed   By: Tish Frederickson M.D.   On: 06/09/2023 02:01   DG Chest Port 1 View  Result Date: 06/09/2023 CLINICAL DATA:  Ingestion. Suicidal ideation. Pt was at [facility] when pt texted EMS for them to pick him up. Per EMS, pt has 2-3 laceration on left wrist. Pt told EMS that  he took over 6000 mg of Naproxen, 3-4 Keppra. Lower abdominal pain, wbc's 10.8 EXAM: PORTABLE CHEST 1 VIEW COMPARISON:  Cxr 05/25/23 FINDINGS: The heart and mediastinal contours are within normal limits. No focal consolidation. No pulmonary edema. No pleural effusion. No pneumothorax. No acute osseous abnormality. IMPRESSION: No active disease. Electronically Signed   By: Tish Frederickson M.D.   On: 06/09/2023 01:58    Medications Ordered in ED Medications  sodium chloride 0.9 % bolus 1,000 mL (0 mLs Intravenous Stopped 06/09/23 0157)    And  0.9 %  sodium chloride infusion (1,000 mLs Intravenous New Bag/Given 06/09/23 0205)  lidocaine-EPINEPHrine (XYLOCAINE W/EPI) 2 %-1:200000 (PF) injection 20 mL (20 mLs Intradermal Given 06/09/23 0007)  iohexol (OMNIPAQUE) 300 MG/ML solution 100 mL (100 mLs  Intravenous Contrast Given 06/09/23 0138)  alum & mag hydroxide-simeth (MAALOX/MYLANTA) 200-200-20 MG/5ML suspension 30 mL (30 mLs Oral Given 06/09/23 0607)    And  lidocaine (XYLOCAINE) 2 % viscous mouth solution 15 mL (15 mLs Oral Given 06/09/23 0607)   Procedures .Critical Care  Performed by: Nira Conn, MD Authorized by: Nira Conn, MD   Critical care provider statement:    Critical care time (minutes):  76   Critical care time was exclusive of:  Separately billable procedures and treating other patients   Critical care was necessary to treat or prevent imminent or life-threatening deterioration of the following conditions: suicide attempt.   Critical care was time spent personally by me on the following activities:  Development of treatment plan with patient or surrogate, discussions with consultants, evaluation of patient's response to treatment, examination of patient, obtaining history from patient or surrogate, review of old charts, re-evaluation of patient's condition, pulse oximetry, ordering and review of radiographic studies, ordering and review of laboratory studies and ordering and performing treatments and interventions   (including critical care time) Medical Decision Making / ED Course   Medical Decision Making Amount and/or Complexity of Data Reviewed Labs: ordered. Decision-making details documented in ED Course. Radiology: ordered and independent interpretation performed. Decision-making details documented in ED Course. ECG/medicine tests: ordered and independent interpretation performed. Decision-making details documented in ED Course.  Risk OTC drugs. Prescription drug management.    Intentional overdose on naproxen and self mutilation  Labs reassuring.  Coingestion labs reassuring.  Repeat acetaminophen level normal. Repeat EKG stable. CT abdomen pelvis negative for any acute intra-abdominal inflammatory/infectious process.  No bowel  perforation. Lacerations thoroughly irrigated and closed by APP.  Please see their note.  Provided with IV fluids.  Monitor for 9 hours. Medically cleared for [facility] eval and dispo    Final Clinical Impression(s) / ED Diagnoses Final diagnoses:  Suicide attempt (HCC)  Laceration of left forearm, initial encounter    This chart was dictated using voice recognition software.  Despite best efforts to proofread,  errors can occur which can change the documentation meaning.    Nira Conn, MD 06/09/23 [phone]

## 2023-06-08 NOTE — ED Triage Notes (Signed)
Pt c/o suicidal ideation, Per EMS, pt was at [facility] when pt texted EMS for them to pick him up. Per EMS, pt has 2-3 laceration on left wrist. Pt told EMS that he took over 6000 mg of Naproxen, 3-4 Keppra, and 1 Lamotrigine tonight around 21:15. Pt reports he took the medication for seizures. Pt also c/o lower abdominal pain. 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

## 2023-06-09 ENCOUNTER — Other Ambulatory Visit: Payer: Self-pay

## 2023-06-09 ENCOUNTER — Encounter (HOSPITAL_COMMUNITY): Payer: Self-pay

## 2023-06-09 ENCOUNTER — Emergency Department (HOSPITAL_COMMUNITY): Payer: BC Managed Care – PPO

## 2023-06-09 DIAGNOSIS — R45851 Suicidal ideations: Secondary | ICD-10-CM | POA: Diagnosis not present

## 2023-06-09 LAB — ACETAMINOPHEN LEVEL
Acetaminophen (Tylenol), Serum: <10 ug/mL — ABNORMAL LOW (ref 10–30)
Acetaminophen (Tylenol), Serum: <10 ug/mL — ABNORMAL LOW (ref 10–30)

## 2023-06-09 LAB — COMPREHENSIVE METABOLIC PANEL
ALT: 33 U/L (ref 0–44)
AST: 27 U/L (ref 15–41)
Albumin: 4.6 g/dL (ref 3.5–5.0)
Alkaline Phosphatase: 81 U/L (ref 38–126)
Anion gap: 13 (ref 5–15)
BUN: 12 mg/dL (ref 6–20)
CO2: 20 mmol/L — ABNORMAL LOW (ref 22–32)
Calcium: 9.8 mg/dL (ref 8.9–10.3)
Chloride: 103 mmol/L (ref 98–111)
Creatinine, Ser: 1.19 mg/dL (ref 0.61–1.24)
GFR, Estimated: >60 mL/min (ref >60)
Glucose, Bld: 103 mg/dL — ABNORMAL HIGH (ref 70–99)
Potassium: 3.3 mmol/L — ABNORMAL LOW (ref 3.5–5.1)
Sodium: 136 mmol/L (ref 135–145)
Total Bilirubin: 0.6 mg/dL (ref <1.2)
Total Protein: 7.5 g/dL (ref 6.5–8.1)

## 2023-06-09 LAB — LIPASE, BLOOD: Lipase: 36 U/L (ref 11–51)

## 2023-06-09 LAB — BLOOD GAS, VENOUS
Acid-Base Excess: 2.3 mmol/L — ABNORMAL HIGH (ref 0.0–2.0)
Bicarbonate: 26.5 mmol/L (ref 20.0–28.0)
O2 Saturation: 93.7 %
Patient temperature: 37
pCO2, Ven: 39 mm[Hg] — ABNORMAL LOW (ref 44–60)
pH, Ven: 7.44 — ABNORMAL HIGH (ref 7.25–7.43)
pO2, Ven: 64 mm[Hg] — ABNORMAL HIGH (ref 32–45)

## 2023-06-09 LAB — SALICYLATE LEVEL
Salicylate Lvl: <7 mg/dL — ABNORMAL LOW (ref 7.0–30.0)
Salicylate Lvl: <7 mg/dL — ABNORMAL LOW (ref 7.0–30.0)

## 2023-06-09 LAB — ETHANOL: Alcohol, Ethyl (B): <10 mg/dL (ref <10)

## 2023-06-09 MED ORDER — IOHEXOL 300 MG/ML  SOLN
100.0000 mL | Freq: Once | INTRAMUSCULAR | Status: AC | PRN
  Administered 2023-06-09: 100 mL via INTRAVENOUS

## 2023-06-09 MED ORDER — FLUOXETINE HCL 20 MG PO CAPS
60.0000 mg | ORAL_CAPSULE | Freq: Every day | ORAL | Status: DC
  Administered 2023-06-09: 60 mg via ORAL
  Filled 2023-06-09: qty 3

## 2023-06-09 MED ORDER — LIDOCAINE VISCOUS HCL 2 % MT SOLN
15.0000 mL | Freq: Once | OROMUCOSAL | Status: AC
  Administered 2023-06-09: 15 mL via ORAL
  Filled 2023-06-09: qty 15

## 2023-06-09 MED ORDER — ARIPIPRAZOLE 5 MG PO TABS: 5.0000 mg | ORAL_TABLET | Freq: Every day | ORAL | Status: DC

## 2023-06-09 MED ORDER — ESOMEPRAZOLE MAGNESIUM 40 MG PO CPDR: 40.0000 mg | DELAYED_RELEASE_CAPSULE | Freq: Every day | ORAL | 0 refills | Status: DC

## 2023-06-09 MED ORDER — ALUM & MAG HYDROXIDE-SIMETH 200-200-20 MG/5ML PO SUSP
30.0000 mL | Freq: Once | ORAL | Status: AC
  Administered 2023-06-09: 30 mL via ORAL
  Filled 2023-06-09: qty 30

## 2023-06-09 MED ORDER — LAMOTRIGINE 25 MG PO TABS
25.0000 mg | ORAL_TABLET | Freq: Every day | ORAL | Status: DC
  Administered 2023-06-09: 25 mg via ORAL
  Filled 2023-06-09: qty 1

## 2023-06-09 NOTE — Discharge Instructions (Addendum)
Please take Nexium 40 mg daily to help protect your stomach.  Please avoid taking too much naproxen or anti-inflammatories.  See your doctor for follow-up and please see your psychiatrist or counselor  Return to ER if you have thoughts of harming yourself or others or hallucinations

## 2023-06-09 NOTE — ED Provider Notes (Signed)
  Physical Exam  BP (!) 151/88   Pulse 96   Temp 98.3 F (36.8 C) (Oral)   Resp (!) 23   Ht 6\' 3"  (1.905 m)   Wt 108 kg   SpO2 98%   BMI 29.76 kg/m   Physical Exam  Procedures  Procedures  ED Course / MDM    Medical Decision Making Care assumed at 3 PM.  Patient was seen by psychiatry earlier today and was cleared.  Patient came in after taking naproxen.  Patient was cleared medically.  Patient states that he feels okay right now.  He states that if he goes home, he has support and he lives with his mom.  He does not feel that he will go home and kill himself.  I gave him resources.  Told him to return to the ER if he has thoughts of harming himself or others.  I have also rescinded the IVC as well.  Problems Addressed: Laceration of left forearm, initial encounter: acute illness or injury Suicide attempt [facility]): acute illness or injury  Amount and/or Complexity of Data Reviewed Labs: ordered. Decision-making details documented in ED Course. Radiology: ordered and independent interpretation performed. Decision-making details documented in ED Course.  Risk OTC drugs. Prescription drug management.          Charlynne Pander, MD 06/09/23 [phone]

## 2023-06-09 NOTE — ED Notes (Signed)
Patient discharged off unit to home per provider. Patient alert, calm, cooperative, no s/s of distress at this time. No suicidal ideation noted at this time. Discharge information given to patient with acknowledged understanding. Belongings given to patient. Patient ambulatory off unit, escorted by NT. Patient transported by [employer]

## 2023-06-09 NOTE — Consult Note (Signed)
[facility] ED ASSESSMENT   Reason for Consult:  Psych consult Referring Physician:  Drema Pry Patient Identification: Vincent Frank MRN:  [id number] ED Chief Complaint: Suicidal ideation  Diagnosis:  Principal Problem:   Suicidal ideation   ED Assessment Time Calculation: Start Time: 0700 Stop Time: 0802 Total Time in Minutes (Assessment Completion): 62  HPI:  Vincent Frank is a 22 y.o. male patient brought in by EMS with complaints of suicidal ideation.  Patient texted EMS to pick him up at the [facility].  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  Patient has a history of MDD, ODD, SI, HI, GAD, Mood disorder and psychogenic nonepileptic seizures. He was discharged 06/05/2023 from the [facility]   Subjective:   Vincent Frank is a 22 y.o. male patient brought in by EMS with complaints of suicidal ideation.  Patient texted EMS to pick him up at the [facility].  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  Patient has a history of MDD, ODD, SI, HI, GAD, Mood disorder and psychogenic nonepileptic seizures. He was discharged 06/05/2023 from the [facility].   [name], 22 y.o., male patient seen face to face by this provider, consulted with Dr. Lucianne Muss; and chart reviewed on 06/09/23.  On evaluation [name] reports "I had a rough night. I was cutting a bit and I OD'd. I lost my job at the fire department due to mental health reasons."  Patient said he has a therapist and psychiatrists and that he just needs "to work his plan".  He firmly denies feeling suicidal at this time.  Patient states his relationships with his  family are not great.  He lives with his parents and states "I just stay to myself."  His older brother and sister both live away from home and his relationship with them is also "not great".  Patient states he does not have many friends or much of a support system and simply reiterates "I stick to myself".  Patient does say he struggles with being impulsive, he sleeps ok and his appetite is good.  He endorses a trauma history, but did not want to elaborate.    During evaluation Vincent Frank is laying in bed in no acute distress; at one point he smiled and looked very relaxed.  He is alert, oriented x 4, calm, cooperative and attentive.  His mood is euthymic with congruent affect.  He has normal speech, and behavior.  Objectively there is no evidence of psychosis/mania or delusional thinking.  Patient is able to converse coherently, goal directed thoughts, no distractibility, or pre-occupation.  He denies suicidal/self-harm/homicidal ideation, psychosis, and paranoia.  Patient answered questions appropriately.    Patient is not a danger to himself; he denies suicidal ideation. He does not meet criteria for IVC or inpatient psychiatric hospitalization.    Safety Plan Marvis Millison [place] will reach out to Tonnie Kahle his Mom, call 911 or call mobile crisis, or go to nearest emergency room if condition worsens or if suicidal thoughts become active Patient' will follow up with [facility], for outpatient psychiatric services (therapy/medication management). Patient must make appointment before discharge  as a requirement for returning home. The suicide prevention education provided includes the following: Suicide risk factors Suicide prevention and interventions National Suicide Hotline telephone number [facility] assessment telephone number [facility] Emergency Assistance 911 [facility] and/or Residential Mobile Crisis Unit telephone  number Request made of family/significant other to:   Remove weapons (e.g., guns, rifles, knives), all items previously/currently identified as safety concern.   Remove drugs/medications (over the counter, prescriptions, illicit drugs), all items previously/currently identified as a safety concern.   Past Psychiatric History: MDD, ODD, SI, HI, GAD, Mood disorder and psychogenic nonepileptic seizures. He was discharged 06/05/2023 from the [facility]  Risk to Self or Others: Is the patient at risk to self? No Has the patient been a risk to self in the past 6 months? Yes Has the patient been a risk to self within the distant past? Yes Is the patient a risk to others? No Has the patient been a risk to others in the past 6 months? No Has the patient been a risk to others within the distant past? No  [place] Scale:  Flowsheet Row ED from 06/08/2023 in [facility] Emergency Department at [facility] Admission (Discharged) [facility] (Discharged) from 06/01/2023 in [facility]  C-SSRS RISK CATEGORY High Risk High Risk No Risk       Substance Abuse:   Denies  Past Medical History:  Past Medical History:  Diagnosis Date   Major depressive disorder    Seizures (HCC)     Past Surgical History:  Procedure Laterality Date   NO PAST SURGERIES     Family History:  Family History  Problem Relation Age of Onset   Healthy Mother    Family Psychiatric  History: 1/2 brother - bipolar and substance abuse Social History:  Social History   Substance and Sexual Activity  Alcohol Use Never     Social History   Substance and Sexual Activity  Drug Use Never    Social History   Socioeconomic History   Marital status: Single    Spouse name: Not on file   Number of children: Not on file   Years of education: Not on file   Highest education level: Not on file  Occupational  History   Not on file  Tobacco Use   Smoking status: Never   Smokeless tobacco: Never  Vaping Use   Vaping status: Never Used  Substance and Sexual Activity   Alcohol use: Never   Drug use: Never   Sexual activity: Not Currently  Other Topics Concern   Not on file  Social History Narrative   ** Merged History Encounter **       Social Determinants of Health   Financial Resource Strain: Low Risk  (05/07/2023)   Received from [facility]   Overall Financial Resource Strain (CARDIA)    Difficulty of Paying Living Expenses: Not very hard  Food Insecurity: No Food Insecurity (06/03/2023)   Hunger Vital Sign    Worried About Running Out of Food in the Last Year: Never true    Ran Out of Food in the Last Year: Never true  Transportation Needs: No Transportation Needs (06/03/2023)   PRAPARE - Administrator, Civil Service (Medical): No    Lack of Transportation (Non-Medical): No  Recent Concern: Transportation Needs - Unmet Transportation Needs (05/07/2023)   Received from [facility]  PRAPARE - Transportation    In the past 12 months, has lack of transportation kept you from medical appointments or from getting medications?: No    Lack of Transportation (Non-Medical): Yes  Physical Activity: Not on file  Stress: Not on file  Social Connections: Unknown (01/16/2023)   Received from [facility]   Social Network    Social Network: Not on file   Additional Social History: Lives with parents; he has 2 older siblings that live independently.    Allergies:   Allergies  Allergen Reactions   Bee Venom Anaphylaxis   Haldol [Haloperidol] Anaphylaxis    Per father dystonic reaction with  muscle contractures.   Hornet Venom Anaphylaxis and Itching   Gabapentin     twitching    Labs:  Results for orders placed or performed during the hospital encounter of 06/08/23 (from the past 48 hour(s))  Comprehensive metabolic panel     Status:  Abnormal   Collection Time: 06/08/23 11:44 PM  Result Value Ref Range   Sodium 136 135 - 145 mmol/L   Potassium 3.3 (L) 3.5 - 5.1 mmol/L   Chloride 103 98 - 111 mmol/L   CO2 20 (L) 22 - 32 mmol/L   Glucose, Bld 103 (H) 70 - 99 mg/dL    Comment: Glucose reference range applies only to samples taken after fasting for at least 8 hours.   BUN 12 6 - 20 mg/dL   Creatinine, Ser 1.61 0.61 - 1.24 mg/dL   Calcium 9.8 8.9 - 09.6 mg/dL   Total Protein 7.5 6.5 - 8.1 g/dL   Albumin 4.6 3.5 - 5.0 g/dL   AST 27 15 - 41 U/L   ALT 33 0 - 44 U/L   Alkaline Phosphatase 81 38 - 126 U/L   Total Bilirubin 0.6 <1.2 mg/dL   GFR, Estimated >04 >54 mL/min    Comment: (NOTE) Calculated using the CKD-EPI Creatinine Equation (2021)    Anion gap 13 5 - 15    Comment: Performed at [facility], [address], [place], [place] [address]  Salicylate level     Status: Abnormal   Collection Time: 06/08/23 11:44 PM  Result Value Ref Range   Salicylate Lvl <7.0 (L) 7.0 - 30.0 mg/dL    Comment: Performed at [facility], [address], [place], [place] [address]  Acetaminophen level     Status: Abnormal   Collection Time: 06/08/23 11:44 PM  Result Value Ref Range   Acetaminophen (Tylenol), Serum <10 (L) 10 - 30 ug/mL    Comment: (NOTE) Therapeutic concentrations vary significantly. A range of 10-30 ug/mL  may be an effective concentration for many patients. However, some  are best treated at concentrations outside of this range. Acetaminophen concentrations >150 ug/mL at 4 hours after ingestion  and >50 ug/mL at 12 hours after ingestion are often associated with  toxic reactions.  Performed at [facility], [address], [place], [place] [address]   Ethanol     Status: None   Collection Time: 06/08/23 11:44 PM  Result Value Ref Range   Alcohol, Ethyl (B) <10 <10 mg/dL    Comment: (NOTE) Lowest detectable limit for serum alcohol is 10  mg/dL.  For medical purposes only. Performed at [facility], [address], [place], [place] [address]   CBC WITH DIFFERENTIAL     Status: Abnormal   Collection Time: 06/08/23 11:44 PM  Result Value Ref Range   WBC 10.8 (H)  4.0 - 10.5 K/uL   RBC 4.92 4.22 - 5.81 MIL/uL   Hemoglobin 12.9 (L) 13.0 - 17.0 g/dL   HCT 16.1 (L) 09.6 - 04.5 %   MCV 76.6 (L) 80.0 - 100.0 fL   MCH 26.2 26.0 - 34.0 pg   MCHC 34.2 30.0 - 36.0 g/dL   RDW 40.9 81.1 - 91.4 %   Platelets 256 150 - 400 K/uL   nRBC 0.0 0.0 - 0.2 %   Neutrophils Relative % 79 %   Neutro Abs 8.5 (H) 1.7 - 7.7 K/uL   Lymphocytes Relative 11 %   Lymphs Abs 1.2 0.7 - 4.0 K/uL   Monocytes Relative 7 %   Monocytes Absolute 0.8 0.1 - 1.0 K/uL   Eosinophils Relative 2 %   Eosinophils Absolute 0.2 0.0 - 0.5 K/uL   Basophils Relative 1 %   Basophils Absolute 0.1 0.0 - 0.1 K/uL   Immature Granulocytes 0 %   Abs Immature Granulocytes 0.04 0.00 - 0.07 K/uL    Comment: Performed at [facility], [address], [place], [place] [address]  Lipase, blood     Status: None   Collection Time: 06/08/23 11:44 PM  Result Value Ref Range   Lipase 36 11 - 51 U/L    Comment: Performed at [facility], [address], [place], [place] [address]  Blood gas, venous (at [facility])     Status: Abnormal   Collection Time: 06/08/23 11:49 PM  Result Value Ref Range   pH, Ven 7.44 (H) 7.25 - 7.43   pCO2, Ven 39 (L) 44 - 60 mmHg   pO2, Ven 64 (H) 32 - 45 mmHg   Bicarbonate 26.5 20.0 - 28.0 mmol/L   Acid-Base Excess 2.3 (H) 0.0 - 2.0 mmol/L   O2 Saturation 93.7 %   Patient temperature 37.0     Comment: Performed at [facility], [address] [place], [place] [address]  I-Stat Chem 8, ED     Status: Abnormal   Collection Time: 06/08/23 11:55 PM  Result Value Ref Range   Sodium 140 135 - 145 mmol/L   Potassium 3.4 (L) 3.5 - 5.1 mmol/L   Chloride 105 98 - 111 mmol/L   BUN  12 6 - 20 mg/dL   Creatinine, Ser 4.69 (H) 0.61 - 1.24 mg/dL   Glucose, Bld 629 (H) 70 - 99 mg/dL    Comment: Glucose reference range applies only to samples taken after fasting for at least 8 hours.   Calcium, Ion 1.20 1.15 - 1.40 mmol/L   TCO2 21 (L) 22 - 32 mmol/L   Hemoglobin 13.3 13.0 - 17.0 g/dL   HCT 52.8 41.3 - 24.4 %  Acetaminophen level     Status: Abnormal   Collection Time: 06/09/23  1:58 AM  Result Value Ref Range   Acetaminophen (Tylenol), Serum <10 (L) 10 - 30 ug/mL    Comment: (NOTE) Therapeutic concentrations vary significantly. A range of 10-30 ug/mL  may be an effective concentration for many patients. However, some  are best treated at concentrations outside of this range. Acetaminophen concentrations >150 ug/mL at 4 hours after ingestion  and >50 ug/mL at 12 hours after ingestion are often associated with  toxic reactions.  Performed at [facility], [address] [place], [place] [address]   Salicylate level     Status: Abnormal   Collection Time: 06/09/23  1:58 AM  Result Value Ref Range   Salicylate  Lvl <7.0 (L) 7.0 - 30.0 mg/dL    Comment: Performed at [facility], [address] [place], [place] [address]    Current Facility-Administered Medications  Medication Dose Route Frequency Provider Last Rate Last Admin   0.9 %  sodium chloride infusion   Intravenous Continuous Cardama, Amadeo Garnet, MD   Stopped at 06/09/23 0731   ARIPiprazole (ABILIFY) tablet 5 mg  5 mg Oral QHS Sigifredo Pignato A, NP       FLUoxetine (PROZAC) capsule 60 mg  60 mg Oral Daily Jo Cerone A, NP       lamoTRIgine (LAMICTAL) tablet 25 mg  25 mg Oral Daily Christin Moline A, NP       Current Outpatient Medications  Medication Sig Dispense Refill   ARIPiprazole (ABILIFY) 5 MG tablet Take 1 tablet (5 mg total) by mouth at bedtime for 14 days. 14 tablet 0   FLUoxetine (PROZAC) 20 MG capsule Take 3 capsules (60 mg total) by mouth daily for 14 days.  42 capsule 0   hydrOXYzine (ATARAX) 25 MG tablet Take 1 tablet (25 mg total) by mouth 3 (three) times daily as needed for anxiety. 14 tablet 0   lamoTRIgine (LAMICTAL) 25 MG tablet Take 1 tablet (25 mg total) by mouth daily for 14 days. 14 tablet 0   levETIRAcetam (KEPPRA) 500 MG tablet Take 500 mg by mouth 2 (two) times daily.     naproxen sodium (ALEVE) 220 MG tablet Take 220-440 mg by mouth daily as needed (Pain).     traZODone (DESYREL) 50 MG tablet Take 1 tablet (50 mg total) by mouth at bedtime. (Patient taking differently: Take 50 mg by mouth at bedtime as needed for sleep.) 14 tablet 0    Musculoskeletal: Strength & Muscle Tone: within normal limits Gait & Station: normal Patient leans: N/A   Psychiatric Specialty Exam: Presentation  General Appearance:  Appropriate for Environment  Eye Contact: Good  Speech: Clear and Coherent  Speech Volume: Normal  Handedness: Right   Mood and Affect  Mood: Euthymic  Affect: Congruent   Thought Process  Thought Processes: Coherent  Descriptions of Associations:Intact  Orientation:Full (Time, Place and Person)  Thought Content:WDL  History of Schizophrenia/Schizoaffective disorder:No  Duration of Psychotic Symptoms:No data recorded Hallucinations:Hallucinations: None  Ideas of Reference:None  Suicidal Thoughts:Suicidal Thoughts: No  Homicidal Thoughts:Homicidal Thoughts: No   Sensorium  Memory: Immediate Good; Recent Good; Remote Good  Judgment: Impaired  Insight: Fair   Chartered certified accountant: Fair  Attention Span: Fair  Recall: Good  Fund of Knowledge: Good  Language: Good   Psychomotor Activity  Psychomotor Activity: Psychomotor Activity: Normal   Assets  Assets: Communication Skills; Housing; Social Support; Leisure Time    Sleep  Sleep: Sleep: Fair Number of Hours of Sleep: 6   Physical Exam: Physical Exam Vitals and nursing note reviewed.  Eyes:      Pupils: Pupils are equal, round, and reactive to light.  Pulmonary:     Effort: Pulmonary effort is normal.  Skin:    General: Skin is dry.  Neurological:     Mental Status: He is alert and oriented to person, place, and time.  Psychiatric:        Attention and Perception: Attention and perception normal.        Mood and Affect: Mood and affect normal.        Speech: Speech normal.        Behavior: Behavior normal. Behavior is cooperative.  Thought Content: Thought content normal.        Cognition and Memory: Cognition normal.        Judgment: Judgment is impulsive.    Review of Systems  Skin:        Wounds on left wrist sutured.  All other systems reviewed and are negative.  Blood pressure (!) 151/88, pulse 96, temperature 98.3 F (36.8 C), temperature source Oral, resp. rate (!) 23, height 6\' 3"  (1.905 m), weight 108 kg, SpO2 98%. Body mass index is 29.76 kg/m.  Medical Decision Making: Patient case reviewed and discussed with Dr Lucianne Muss. Patient is not currently a danger to himself; he denies suicidal ideation. He does not meet criteria for IVC or inpatient psychiatric hospitalization. Patient lives with his parents and safety planning with Mom and patient is complete.  Mom confirms there are no firearms in the home and the knives are locked up.   Safety Plan Cambren [place] will reach out to Sourya Isip his Mom, call 911 or call mobile crisis, or go to nearest emergency room if condition worsens or if suicidal thoughts become active Patient' will follow up with [facility], for outpatient psychiatric services (therapy/medication management). Patient must make appointment before discharge as a requirement for returning home. The suicide prevention education provided includes the following: Suicide risk factors Suicide prevention and interventions National Suicide Hotline telephone number [facility] assessment  telephone number [facility] Emergency Assistance 911 [facility] and/or Residential Mobile Crisis Unit telephone number Request made of family/significant other to:   Remove weapons (e.g., guns, rifles, knives), all items previously/currently identified as safety concern.   Remove drugs/medications (over the counter, prescriptions, illicit drugs), all items previously/currently identified as a safety concern.   Problem 1: Suicidal ideation - Continue outpatient medications.   Disposition:  Patient is psychiatrically cleared.  Thomes Lolling, NP 06/09/2023 9:59 AM

## 2023-06-09 NOTE — ED Notes (Signed)
Per poison control, they recommend another Tylenol level at 1:15, observe for a minimum of 8 hours. We need another EKG in 6 hours (around 5:30 am) if the QRSD is greater than 140 they recommend Sodium Bicarb bolus. Poison Control will follow up with [place].

## 2023-06-09 NOTE — ED Provider Notes (Signed)
Physical Exam  BP (!) 136/105   Pulse 83   Temp 98.9 F (37.2 C)   Resp (!) 29   Ht 6\' 3"  (1.905 m)   Wt 108 kg   SpO2 100%   BMI 29.76 kg/m   Physical Exam  Procedures  .Marland KitchenLaceration Repair  Date/Time: 06/09/2023 3:45 AM  Performed by: Darrick Grinder, PA-C Authorized by: Darrick Grinder, PA-C   Consent:    Consent obtained:  Verbal   Consent given by:  Patient   Risks, benefits, and alternatives were discussed: yes     Risks discussed:  Infection, need for additional repair, nerve damage, poor wound healing, poor cosmetic result, pain and vascular damage Universal protocol:    Patient identity confirmed:  Verbally with patient Anesthesia:    Anesthesia method:  Local infiltration   Local anesthetic:  Lidocaine 2% WITH epi Laceration details:    Location:  Shoulder/arm   Shoulder/arm location:  L lower arm   Length (cm):  5 Pre-procedure details:    Preparation:  Patient was prepped and draped in usual sterile fashion Exploration:    Hemostasis achieved with:  Epinephrine and direct pressure Treatment:    Area cleansed with:  Saline   Amount of cleaning:  Extensive Skin repair:    Repair method:  Sutures   Suture size:  4-0   Suture material:  Prolene   Suture technique:  Simple interrupted   Number of sutures:  4 Approximation:    Approximation:  Close Repair type:    Repair type:  Simple Post-procedure details:    Dressing:  Open (no dressing)   Procedure completion:  Tolerated well, no immediate complications .Marland KitchenLaceration Repair  Date/Time: 06/09/2023 3:48 AM  Performed by: Darrick Grinder, PA-C Authorized by: Darrick Grinder, PA-C   Consent:    Consent obtained:  Verbal   Consent given by:  Patient   Risks, benefits, and alternatives were discussed: yes     Risks discussed:  Infection, need for additional repair, nerve damage, poor wound healing, poor cosmetic result, pain and vascular damage Universal protocol:    Patient identity  confirmed:  Verbally with patient Anesthesia:    Anesthesia method:  Local infiltration   Local anesthetic:  Lidocaine 2% WITH epi Laceration details:    Location:  Shoulder/arm   Shoulder/arm location:  L lower arm   Length (cm):  7.8 Exploration:    Hemostasis achieved with:  Epinephrine and direct pressure Treatment:    Area cleansed with:  Saline   Amount of cleaning:  Extensive Skin repair:    Repair method:  Sutures   Suture size:  4-0   Suture material:  Prolene   Suture technique:  Simple interrupted   Number of sutures:  5 Approximation:    Approximation:  Close Repair type:    Repair type:  Intermediate Post-procedure details:    Dressing:  Open (no dressing)   Procedure completion:  Tolerated well, no immediate complications .Marland KitchenLaceration Repair  Date/Time: 06/09/2023 3:54 AM  Performed by: Darrick Grinder, PA-C Authorized by: Darrick Grinder, PA-C   Consent:    Consent obtained:  Verbal   Consent given by:  Patient   Risks, benefits, and alternatives were discussed: yes     Risks discussed:  Infection, pain, need for additional repair, nerve damage, poor wound healing, poor cosmetic result and vascular damage Universal protocol:    Patient identity confirmed:  Verbally with patient Anesthesia:    Anesthesia method:  Local infiltration  Local anesthetic:  Lidocaine 2% WITH epi Laceration details:    Location:  Shoulder/arm   Shoulder/arm location:  L lower arm   Length (cm):  5 Exploration:    Hemostasis achieved with:  Epinephrine and direct pressure Treatment:    Area cleansed with:  Saline   Amount of cleaning:  Standard Skin repair:    Repair method:  Sutures   Suture size:  4-0   Suture material:  Prolene   Suture technique:  Horizontal mattress and simple interrupted   Number of sutures:  4 (3 horizontal mattress, 1 simple interrupted) Approximation:    Approximation:  Close Repair type:    Repair type:  Simple Post-procedure details:     Dressing:  Open (no dressing)   Procedure completion:  Tolerated well, no immediate complications   ED Course / MDM    Medical Decision Making Amount and/or Complexity of Data Reviewed Labs: ordered. Radiology: ordered.  Risk Prescription drug management.         Darrick Grinder, PA-C 06/09/23 0404    Nira Conn, MD 06/09/23 [phone]

## 2023-06-09 NOTE — ED Notes (Signed)
[facility] called pts mother Clydie Braun for collateral.Pts mother was unaware of the circumstances that brought pt to the ED.  Pts mother reports that pt has not worked for the [employer] but did volunteer in the summer but was let go from volunteering due to pt being argumentative and not following directions. Pts mother said that pt was working at [employer].  Pts mother also reported that pt has not been in therapy but she is requiring pt to make an appointment with [facility] at [facility] [phone]) before leaving the hospital. Pts mother reports that pt last took his medication on Monday. [facility] agreed to inform pt that he will need to schedule a counseling appointment before leaving the hospital. Pts mother is not able to pick pt up and agreed to have pt return home in a taxi.   Jacquelynn Cree, [facility]  06/09/23

## 2023-06-09 NOTE — ED Notes (Signed)
Medication compliant.

## 2023-06-09 NOTE — ED Notes (Signed)
Poison Control Caryn Bee called this RN , he was updated on pt status , and reports that he will be closing pt chart out and no more recommendation at this time, pertaining to poison control

## 2023-06-09 NOTE — ED Notes (Signed)
Patient transported to CT 

## 2023-06-09 NOTE — ED Notes (Signed)
Pt father has called, and RN has asked pt if he wants his father to receive updates related to him and his plan of care. Pt states " NO". No information was released to father, he hung up by the time RN got back to nurses station, Diplomatic Services operational officer originally answered the phone.

## 2023-07-07 ENCOUNTER — Emergency Department (HOSPITAL_COMMUNITY)
Admission: EM | Admit: 2023-07-07 | Discharge: 2023-07-08 | Disposition: A | Discharge status: Home / Self Care | Payer: BC Managed Care – PPO | Attending: Emergency Medicine | Admitting: Emergency Medicine

## 2023-07-07 ENCOUNTER — Telehealth: Payer: Self-pay | Admitting: *Deleted

## 2023-07-07 ENCOUNTER — Other Ambulatory Visit: Payer: Self-pay

## 2023-07-07 DIAGNOSIS — R109 Unspecified abdominal pain: Secondary | ICD-10-CM | POA: Diagnosis not present

## 2023-07-07 DIAGNOSIS — M545 Low back pain, unspecified: Secondary | ICD-10-CM | POA: Insufficient documentation

## 2023-07-07 DIAGNOSIS — W1789XA Other fall from one level to another, initial encounter: Secondary | ICD-10-CM | POA: Diagnosis not present

## 2023-07-07 DIAGNOSIS — Y9301 Activity, walking, marching and hiking: Secondary | ICD-10-CM | POA: Diagnosis not present

## 2023-07-07 DIAGNOSIS — R339 Retention of urine, unspecified: Secondary | ICD-10-CM | POA: Diagnosis present

## 2023-07-07 LAB — CBC
HCT: 38.7 % — ABNORMAL LOW (ref 39.0–52.0)
Hemoglobin: 12.3 g/dL — ABNORMAL LOW (ref 13.0–17.0)
MCH: 24.2 pg — ABNORMAL LOW (ref 26.0–34.0)
MCHC: 31.8 g/dL (ref 30.0–36.0)
MCV: 76 fL — ABNORMAL LOW (ref 80.0–100.0)
Platelets: 296 10*3/uL (ref 150–400)
RBC: 5.09 MIL/uL (ref 4.22–5.81)
RDW: 13 % (ref 11.5–15.5)
WBC: 8.3 10*3/uL (ref 4.0–10.5)
nRBC: 0 % (ref 0.0–0.2)

## 2023-07-07 LAB — COMPREHENSIVE METABOLIC PANEL
ALT: 27 U/L (ref 0–44)
AST: 28 U/L (ref 15–41)
Albumin: 4.4 g/dL (ref 3.5–5.0)
Alkaline Phosphatase: 65 U/L (ref 38–126)
Anion gap: 11 (ref 5–15)
BUN: 13 mg/dL (ref 6–20)
CO2: 24 mmol/L (ref 22–32)
Calcium: 9.9 mg/dL (ref 8.9–10.3)
Chloride: 102 mmol/L (ref 98–111)
Creatinine, Ser: 1.34 mg/dL — ABNORMAL HIGH (ref 0.61–1.24)
GFR, Estimated: >60 mL/min (ref >60)
Glucose, Bld: 84 mg/dL (ref 70–99)
Potassium: 4.4 mmol/L (ref 3.5–5.1)
Sodium: 137 mmol/L (ref 135–145)
Total Bilirubin: 0.4 mg/dL (ref <1.2)
Total Protein: 7.8 g/dL (ref 6.5–8.1)

## 2023-07-07 LAB — LIPASE, BLOOD: Lipase: 29 U/L (ref 11–51)

## 2023-07-07 NOTE — Progress Notes (Deleted)
  Care Coordination  Outreach Note  07/07/2023 Name: Vincent Frank MRN: [id number] DOB: 2000/12/05   Care Coordination Outreach Attempts: An unsuccessful telephone outreach was attempted today to offer the patient information about available care coordination services.  Follow Up Plan:  Additional outreach attempts will be made to offer the patient care coordination information and services.   Encounter Outcome:  No Answer  Burman Nieves, CCMA Care Coordination Care Guide Direct Dial: [phone]

## 2023-07-07 NOTE — Progress Notes (Signed)
OPENED IN ERROR

## 2023-07-07 NOTE — ED Triage Notes (Signed)
Patient reports LLQ abdominal pain with urinary retention/dysuria this evening , denies emesis , occasional diarrhea , no fever or chills.

## 2023-07-07 NOTE — Progress Notes (Deleted)
Error

## 2023-07-08 ENCOUNTER — Encounter (HOSPITAL_COMMUNITY): Payer: Self-pay | Admitting: Emergency Medicine

## 2023-07-08 ENCOUNTER — Emergency Department (HOSPITAL_COMMUNITY): Payer: BC Managed Care – PPO

## 2023-07-08 LAB — URINALYSIS, ROUTINE W REFLEX MICROSCOPIC
Bilirubin Urine: NEGATIVE
Glucose, UA: NEGATIVE mg/dL
Hgb urine dipstick: NEGATIVE
Ketones, ur: NEGATIVE mg/dL
Leukocytes,Ua: NEGATIVE
Nitrite: NEGATIVE
Protein, ur: NEGATIVE mg/dL
Specific Gravity, Urine: 1.002 — ABNORMAL LOW (ref 1.005–1.030)
pH: 7 (ref 5.0–8.0)

## 2023-07-08 NOTE — Discharge Instructions (Signed)
Please schedule a follow-up appointment with urology.  Return to the emergency department if you develop any life-threatening symptoms.

## 2023-07-08 NOTE — ED Provider Notes (Signed)
EMERGENCY DEPARTMENT AT [facility] Provider Note   CSN: [id number] Arrival date & time: 07/07/23  2157     History  Chief Complaint  Patient presents with   Abdominal Pain   Dysuria    Urinary Retention    Vincent Frank is a 22 y.o. male.  Patient with past medical history significant for major depressive disorder, suicide attempts, anxiety, seizures, recent trauma where patient reportedly fell 20 to 30 feet while hiking presents to the emergency department complaining of urinary retention.  Patient states that since his accident on November 17 he has been having increasing difficulty with urination.  Endorses continued low back pain.  He denies saddle anesthesia, fecal incontinence, urinary incontinence.  He denies new trauma to the area.  He denies any new medications.  Denies fevers and IV drug use.  Bladder scan at time of my assessment showed 975 mL.   Abdominal Pain Associated symptoms: dysuria   Dysuria Presenting symptoms: dysuria   Associated symptoms: abdominal pain        Home Medications Prior to Admission medications   Medication Sig Start Date End Date Taking? Authorizing Provider  ARIPiprazole (ABILIFY) 5 MG tablet Take 1 tablet (5 mg total) by mouth at bedtime for 14 days. 06/05/23 06/19/23  Massengill, [name], MD  esomeprazole (NEXIUM) 40 MG capsule Take 1 capsule (40 mg total) by mouth daily. 06/09/23   Charlynne Pander, MD  FLUoxetine (PROZAC) 20 MG capsule Take 3 capsules (60 mg total) by mouth daily for 14 days. 06/06/23 06/20/23  Massengill, [name], MD  hydrOXYzine (ATARAX) 25 MG tablet Take 1 tablet (25 mg total) by mouth 3 (three) times daily as needed for anxiety. 06/05/23   Massengill, [name], MD  lamoTRIgine (LAMICTAL) 25 MG tablet Take 1 tablet (25 mg total) by mouth daily for 14 days. 06/06/23 06/20/23  Massengill, [name], MD  levETIRAcetam (KEPPRA) 500 MG tablet Take 500 mg by mouth 2 (two) times daily.    [provider]  naproxen sodium (ALEVE) 220 MG tablet Take 220-440 mg by mouth daily as needed (Pain).    [provider]  traZODone (DESYREL) 50 MG tablet Take 1 tablet (50 mg total) by mouth at bedtime. Patient taking differently: Take 50 mg by mouth at bedtime as needed for sleep. 06/05/23   Massengill, [name], MD      Allergies    Bee venom, Haldol [haloperidol], Hornet venom, and Gabapentin    Review of Systems   Review of Systems  Gastrointestinal:  Positive for abdominal pain.  Genitourinary:  Positive for dysuria.    Physical Exam Updated Vital Signs BP 114/69   Pulse 79   Temp 98 F (36.7 C)   Resp 16   SpO2 96%  Physical Exam Vitals and nursing note reviewed.  HENT:     Head: Normocephalic and atraumatic.  Eyes:     Extraocular Movements: Extraocular movements intact.  Cardiovascular:     Rate and Rhythm: Normal rate.  Pulmonary:     Effort: Pulmonary effort is normal. No respiratory distress.  Abdominal:     Palpations: Abdomen is soft.     Tenderness: There is no abdominal tenderness.  Musculoskeletal:        General: No signs of injury.     Cervical back: Normal range of motion.  Skin:    General: Skin is dry.  Neurological:     Mental Status: He is alert.     Motor: No weakness.  Psychiatric:  Speech: Speech normal.        Behavior: Behavior normal.     ED Results / Procedures / Treatments   Labs (all labs ordered are listed, but only abnormal results are displayed) Labs Reviewed  COMPREHENSIVE METABOLIC PANEL - Abnormal; Notable for the following components:      Result Value   Creatinine, Ser 1.34 (*)    All other components within normal limits  CBC - Abnormal; Notable for the following components:   Hemoglobin 12.3 (*)    HCT 38.7 (*)    MCV 76.0 (*)    MCH 24.2 (*)    All other components within normal limits  URINALYSIS, ROUTINE W REFLEX MICROSCOPIC - Abnormal; Notable for the following components:   Color, Urine STRAW  (*)    Specific Gravity, Urine 1.002 (*)    All other components within normal limits  LIPASE, BLOOD    EKG None  Radiology MR LUMBAR SPINE WO CONTRAST Result Date: 07/08/2023 CLINICAL DATA:  Initial evaluation for low back pain, cauda equina syndrome suspected. EXAM: MRI LUMBAR SPINE WITHOUT CONTRAST TECHNIQUE: Multiplanar, multisequence MR imaging of the lumbar spine was performed. No intravenous contrast was administered. COMPARISON:  None Available. FINDINGS: Segmentation: Standard. Lowest well-formed disc space labeled the L5-S1 level. Alignment: Physiologic with preservation of the normal lumbar lordosis. No listhesis. Vertebrae: Vertebral body height maintained without acute or chronic fracture. Bone marrow signal intensity within normal limits. No discrete or worrisome osseous lesions or abnormal marrow edema. Conus medullaris and cauda equina: Conus extends to the L1 level. Conus and cauda equina appear normal. Paraspinal and other soft tissues: Unremarkable. Disc levels: No significant disc pathology seen within the lumbar spine. Intervertebral discs are well hydrated with preserved disc height. No focal disc herniation. No significant facet disease. No canal or neural foraminal stenosis or evidence for neural impingement. IMPRESSION: Normal MRI of the lumbar spine. Electronically Signed   By: Rise Mu M.D.   On: 07/08/2023 03:36    Procedures Procedures    Medications Ordered in ED Medications - No data to display  ED Course/ Medical Decision Making/ A&P                                 Medical Decision Making Amount and/or Complexity of Data Reviewed Labs: ordered. Radiology: ordered.   This patient presents to the ED for concern of urinary retention, this involves an extensive number of treatment options, and is a complaint that carries with it a high risk of complications and morbidity.  The differential diagnosis includes bladder outlet obstruction,  malignancy, cauda equina, medication side effect, others   Co morbidities that complicate the patient evaluation  Seizure history, behavioral health history   Additional history obtained:   External records from outside source obtained and reviewed including notes and imaging results from an outside facility when patient was evaluated on November 17 with negative MRI of the lumbar and thoracic spine, negative CT abdomen pelvis with contrast   Lab Tests:  I Ordered, and personally interpreted labs.  The pertinent results include:  Grossly unremarkable CMP, UA, lipase, CBC   Imaging Studies ordered:  I ordered imaging studies including MR lumbar spine w/o contrast  I independently visualized and interpreted imaging which showed no acute findings I agree with the radiologist interpretation   Test / Admission - Considered:  Patient with negative MR showing no signs of cauda equina.  Unclear if  cause of patient's retention is medication related or some sort of bladder outlet obstruction.  RN reports Foley placed without difficulty.  Return of large amount of urine with significant pain relief at that time.  Urinalysis unremarkable showing no signs of infection.  Plan to discharge home with Foley catheter and recommendations for follow-up with urology.  Patient given return precautions including inability to urinate through Foley catheter.         Final Clinical Impression(s) / ED Diagnoses Final diagnoses:  Urinary retention    Rx / DC Orders ED Discharge Orders     None         Pamala Duffel 07/08/23 0406    Mesner, Barbara Cower, MD 07/09/23 [phone]

## 2023-07-08 NOTE — ED Notes (Signed)
Patient changed to a leg bag; foley-care taught to patient with demonstration.

## 2023-07-11 ENCOUNTER — Encounter (HOSPITAL_COMMUNITY): Payer: Self-pay | Admitting: *Deleted

## 2023-07-11 ENCOUNTER — Emergency Department (HOSPITAL_COMMUNITY)
Admission: EM | Admit: 2023-07-11 | Discharge: 2023-07-12 | Disposition: A | Discharge status: Home / Self Care | Payer: BC Managed Care – PPO | Attending: Emergency Medicine | Admitting: Emergency Medicine

## 2023-07-11 ENCOUNTER — Other Ambulatory Visit: Payer: Self-pay

## 2023-07-11 DIAGNOSIS — R112 Nausea with vomiting, unspecified: Secondary | ICD-10-CM | POA: Diagnosis not present

## 2023-07-11 DIAGNOSIS — R1030 Lower abdominal pain, unspecified: Secondary | ICD-10-CM | POA: Insufficient documentation

## 2023-07-11 DIAGNOSIS — N3289 Other specified disorders of bladder: Secondary | ICD-10-CM | POA: Diagnosis not present

## 2023-07-11 DIAGNOSIS — R338 Other retention of urine: Secondary | ICD-10-CM

## 2023-07-11 DIAGNOSIS — R339 Retention of urine, unspecified: Secondary | ICD-10-CM | POA: Insufficient documentation

## 2023-07-11 MED ORDER — ONDANSETRON 4 MG PO TBDP
8.0000 mg | ORAL_TABLET | Freq: Once | ORAL | Status: AC
  Administered 2023-07-11: 8 mg via ORAL
  Filled 2023-07-11: qty 2

## 2023-07-11 NOTE — ED Triage Notes (Signed)
The pt has abd pain all day with nausea vomiting

## 2023-07-12 ENCOUNTER — Emergency Department (HOSPITAL_COMMUNITY): Payer: BC Managed Care – PPO

## 2023-07-12 ENCOUNTER — Emergency Department (HOSPITAL_BASED_OUTPATIENT_CLINIC_OR_DEPARTMENT_OTHER)
Admission: EM | Admit: 2023-07-12 | Discharge: 2023-07-12 | Disposition: A | Discharge status: Home / Self Care | Payer: BC Managed Care – PPO | Source: Home / Self Care | Attending: Emergency Medicine | Admitting: Emergency Medicine

## 2023-07-12 ENCOUNTER — Encounter (HOSPITAL_BASED_OUTPATIENT_CLINIC_OR_DEPARTMENT_OTHER): Payer: Self-pay

## 2023-07-12 ENCOUNTER — Other Ambulatory Visit: Payer: Self-pay

## 2023-07-12 DIAGNOSIS — N3289 Other specified disorders of bladder: Secondary | ICD-10-CM | POA: Insufficient documentation

## 2023-07-12 DIAGNOSIS — R339 Retention of urine, unspecified: Secondary | ICD-10-CM | POA: Insufficient documentation

## 2023-07-12 DIAGNOSIS — R301 Vesical tenesmus: Secondary | ICD-10-CM

## 2023-07-12 LAB — CBC
HCT: 38.5 % — ABNORMAL LOW (ref 39.0–52.0)
Hemoglobin: 12.1 g/dL — ABNORMAL LOW (ref 13.0–17.0)
MCH: 24.2 pg — ABNORMAL LOW (ref 26.0–34.0)
MCHC: 31.4 g/dL (ref 30.0–36.0)
MCV: 77 fL — ABNORMAL LOW (ref 80.0–100.0)
Platelets: 274 10*3/uL (ref 150–400)
RBC: 5 MIL/uL (ref 4.22–5.81)
RDW: 13.7 % (ref 11.5–15.5)
WBC: 8.6 10*3/uL (ref 4.0–10.5)
nRBC: 0 % (ref 0.0–0.2)

## 2023-07-12 LAB — URINALYSIS, ROUTINE W REFLEX MICROSCOPIC
Bilirubin Urine: NEGATIVE
Bilirubin Urine: NEGATIVE
Glucose, UA: NEGATIVE mg/dL
Glucose, UA: NEGATIVE mg/dL
Hgb urine dipstick: NEGATIVE
Hgb urine dipstick: NEGATIVE
Ketones, ur: NEGATIVE mg/dL
Ketones, ur: NEGATIVE mg/dL
Leukocytes,Ua: NEGATIVE
Leukocytes,Ua: NEGATIVE
Nitrite: NEGATIVE
Nitrite: NEGATIVE
Protein, ur: NEGATIVE mg/dL
Protein, ur: NEGATIVE mg/dL
Specific Gravity, Urine: 1.035 — ABNORMAL HIGH (ref 1.005–1.030)
Specific Gravity, Urine: 1.029 (ref 1.005–1.030)
pH: 5 (ref 5.0–8.0)
pH: 5.5 (ref 5.0–8.0)

## 2023-07-12 LAB — CBC WITH DIFFERENTIAL/PLATELET
Abs Immature Granulocytes: 0.03 10*3/uL (ref 0.00–0.07)
Basophils Absolute: 0 10*3/uL (ref 0.0–0.1)
Basophils Relative: 0 %
Eosinophils Absolute: 0.1 10*3/uL (ref 0.0–0.5)
Eosinophils Relative: 2 %
HCT: 36.6 % — ABNORMAL LOW (ref 39.0–52.0)
Hemoglobin: 11.7 g/dL — ABNORMAL LOW (ref 13.0–17.0)
Immature Granulocytes: 0 %
Lymphocytes Relative: 30 %
Lymphs Abs: 2.7 10*3/uL (ref 0.7–4.0)
MCH: 24.2 pg — ABNORMAL LOW (ref 26.0–34.0)
MCHC: 32 g/dL (ref 30.0–36.0)
MCV: 75.6 fL — ABNORMAL LOW (ref 80.0–100.0)
Monocytes Absolute: 0.7 10*3/uL (ref 0.1–1.0)
Monocytes Relative: 8 %
Neutro Abs: 5.5 10*3/uL (ref 1.7–7.7)
Neutrophils Relative %: 60 %
Platelets: 209 10*3/uL (ref 150–400)
RBC: 4.84 MIL/uL (ref 4.22–5.81)
RDW: 14 % (ref 11.5–15.5)
WBC: 9.1 10*3/uL (ref 4.0–10.5)
nRBC: 0 % (ref 0.0–0.2)

## 2023-07-12 LAB — COMPREHENSIVE METABOLIC PANEL
ALT: 30 U/L (ref 0–44)
ALT: 25 U/L (ref 0–44)
AST: 35 U/L (ref 15–41)
AST: 26 U/L (ref 15–41)
Albumin: 4.2 g/dL (ref 3.5–5.0)
Albumin: 4.5 g/dL (ref 3.5–5.0)
Alkaline Phosphatase: 73 U/L (ref 38–126)
Alkaline Phosphatase: 64 U/L (ref 38–126)
Anion gap: 12 (ref 5–15)
Anion gap: 8 (ref 5–15)
BUN: 13 mg/dL (ref 6–20)
BUN: 13 mg/dL (ref 6–20)
CO2: 22 mmol/L (ref 22–32)
CO2: 25 mmol/L (ref 22–32)
Calcium: 10.1 mg/dL (ref 8.9–10.3)
Calcium: 9.2 mg/dL (ref 8.9–10.3)
Chloride: 106 mmol/L (ref 98–111)
Chloride: 108 mmol/L (ref 98–111)
Creatinine, Ser: 1.37 mg/dL — ABNORMAL HIGH (ref 0.61–1.24)
Creatinine, Ser: 1.36 mg/dL — ABNORMAL HIGH (ref 0.61–1.24)
GFR, Estimated: >60 mL/min (ref >60)
GFR, Estimated: >60 mL/min (ref >60)
Glucose, Bld: 101 mg/dL — ABNORMAL HIGH (ref 70–99)
Glucose, Bld: 85 mg/dL (ref 70–99)
Potassium: 4.3 mmol/L (ref 3.5–5.1)
Potassium: 3.7 mmol/L (ref 3.5–5.1)
Sodium: 140 mmol/L (ref 135–145)
Sodium: 141 mmol/L (ref 135–145)
Total Bilirubin: 0.6 mg/dL (ref <1.2)
Total Bilirubin: 0.5 mg/dL (ref <1.2)
Total Protein: 7.1 g/dL (ref 6.5–8.1)
Total Protein: 7.1 g/dL (ref 6.5–8.1)

## 2023-07-12 LAB — LIPASE, BLOOD: Lipase: 38 U/L (ref 11–51)

## 2023-07-12 MED ORDER — MORPHINE SULFATE (PF) 4 MG/ML IV SOLN
4.0000 mg | Freq: Once | INTRAVENOUS | Status: AC
  Administered 2023-07-12: 4 mg via INTRAVENOUS
  Filled 2023-07-12: qty 1

## 2023-07-12 MED ORDER — SODIUM CHLORIDE 0.9 % IV BOLUS
1000.0000 mL | Freq: Once | INTRAVENOUS | Status: AC
  Administered 2023-07-12: 1000 mL via INTRAVENOUS

## 2023-07-12 MED ORDER — OXYBUTYNIN CHLORIDE 5 MG PO TABS: 5.0000 mg | ORAL_TABLET | Freq: Three times a day (TID) | ORAL | 0 refills | Status: DC

## 2023-07-12 MED ORDER — NAPROXEN 500 MG PO TABS: 500.0000 mg | ORAL_TABLET | Freq: Two times a day (BID) | ORAL | 0 refills | Status: DC

## 2023-07-12 MED ORDER — IOHEXOL 350 MG/ML SOLN
75.0000 mL | Freq: Once | INTRAVENOUS | Status: AC | PRN
  Administered 2023-07-12: 75 mL via INTRAVENOUS

## 2023-07-12 MED ORDER — OXYCODONE-ACETAMINOPHEN 5-325 MG PO TABS: 1.0000 | ORAL_TABLET | Freq: Three times a day (TID) | ORAL | 0 refills | Status: DC | PRN

## 2023-07-12 MED ORDER — OXYBUTYNIN CHLORIDE 5 MG PO TABS: 5.0000 mg | ORAL_TABLET | Freq: Three times a day (TID) | ORAL | Status: DC

## 2023-07-12 MED ORDER — ONDANSETRON HCL 4 MG/2ML IJ SOLN
4.0000 mg | Freq: Once | INTRAMUSCULAR | Status: AC
  Administered 2023-07-12: 4 mg via INTRAVENOUS
  Filled 2023-07-12: qty 2

## 2023-07-12 MED ORDER — KETOROLAC TROMETHAMINE 15 MG/ML IJ SOLN
15.0000 mg | Freq: Once | INTRAMUSCULAR | Status: AC
  Administered 2023-07-12: 15 mg via INTRAVENOUS
  Filled 2023-07-12: qty 1

## 2023-07-12 MED ORDER — ACETAMINOPHEN 500 MG PO TABS
500.0000 mg | ORAL_TABLET | Freq: Four times a day (QID) | ORAL | 0 refills | Status: DC | PRN
Start: 2023-07-12 — End: 2023-07-17

## 2023-07-12 NOTE — ED Notes (Signed)
Pt requested more pain meds... Provider informed.Marland KitchenMarland Kitchen

## 2023-07-12 NOTE — Discharge Instructions (Signed)
Please follow-up with urology at your appointment this Friday.  I do not recommend having the catheter removed prior to urologic evaluation.  If you become unable to urinate through the catheter or develop other life-threatening symptoms return to the emergency department.

## 2023-07-12 NOTE — ED Triage Notes (Signed)
In for further eval of abd pain with nausea and vomiting x1 month. Diag with swelling around bladder, kidneys, and stomach approx 1 month ago and has been dealing with these symptoms ever since. [facility] seen [facility] yesterday, the placed catheter yesterday and he removed it this am per PCP.

## 2023-07-12 NOTE — ED Notes (Signed)
Leg bag applied

## 2023-07-12 NOTE — ED Provider Notes (Signed)
[place] EMERGENCY DEPARTMENT AT [facility] Provider Note   CSN: [id number] Arrival date & time: 07/11/23  2305     History  Chief Complaint  Patient presents with   Abdominal Pain    Vincent Frank is a 22 y.o. male.  Patient presents to the emergency department complaining of abdominal pain across the lower abdomen with nausea and vomiting which began this morning.  Patient was seen at the emergency department by myself on Wednesday night this past week due to urinary retention with over 1000 mL of urine in the bladder.  He had an MRI of the lumbar spine at that time showing no sign of cauda equina or other explanation for patient's urinary retention.  It was questioned if patient psychiatric medications may be having a rare side effect of urinary retention.  Patient was sent home with a Foley catheter with recommendations for urologic follow-up.  Yesterday patient states that the catheter had been irritating him and he went to his family medicine practice who removed the catheter.  He denies being able to urinate since that time.  At time of arrival patient was noted to have urine levels of 370 mL on bladder scan.  Patient denies chest pain, shortness of breath, fever, diarrhea.  He endorses urinary retention, nausea, vomiting, and abdominal pain.  Past medical history otherwise significant for major depressive disorder, history of factitious disorder, oppositional defiant disorder, suicide attempt by cutting of wrist, seizures     Abdominal Pain      Home Medications Prior to Admission medications   Medication Sig Start Date End Date Taking? Authorizing Provider  ARIPiprazole (ABILIFY) 5 MG tablet Take 1 tablet (5 mg total) by mouth at bedtime for 14 days. 06/05/23 06/19/23  Massengill, [name], MD  esomeprazole (NEXIUM) 40 MG capsule Take 1 capsule (40 mg total) by mouth daily. 06/09/23   Charlynne Pander, MD  FLUoxetine (PROZAC) 20 MG capsule Take 3 capsules  (60 mg total) by mouth daily for 14 days. 06/06/23 06/20/23  Massengill, [name], MD  hydrOXYzine (ATARAX) 25 MG tablet Take 1 tablet (25 mg total) by mouth 3 (three) times daily as needed for anxiety. 06/05/23   Massengill, [name], MD  lamoTRIgine (LAMICTAL) 25 MG tablet Take 1 tablet (25 mg total) by mouth daily for 14 days. 06/06/23 06/20/23  Massengill, [name], MD  levETIRAcetam (KEPPRA) 500 MG tablet Take 500 mg by mouth 2 (two) times daily.    [provider]  naproxen sodium (ALEVE) 220 MG tablet Take 220-440 mg by mouth daily as needed (Pain).    [provider]  traZODone (DESYREL) 50 MG tablet Take 1 tablet (50 mg total) by mouth at bedtime. Patient taking differently: Take 50 mg by mouth at bedtime as needed for sleep. 06/05/23   Massengill, [name], MD      Allergies    Bee venom, Haldol [haloperidol], Hornet venom, and Gabapentin    Review of Systems   Review of Systems  Gastrointestinal:  Positive for abdominal pain.    Physical Exam Updated Vital Signs BP (!) 149/109   Pulse 66   Temp 98.4 F (36.9 C) (Oral)   Resp 18   Ht 6\' 3"  (1.905 m)   Wt 108 kg   SpO2 100%   BMI 29.76 kg/m  Physical Exam Vitals and nursing note reviewed.  Constitutional:      General: He is not in acute distress.    Appearance: He is well-developed.  HENT:  Head: Normocephalic and atraumatic.  Eyes:     Conjunctiva/sclera: Conjunctivae normal.  Cardiovascular:     Rate and Rhythm: Normal rate and regular rhythm.  Pulmonary:     Effort: Pulmonary effort is normal. No respiratory distress.     Breath sounds: Normal breath sounds.  Abdominal:     Palpations: Abdomen is soft.     Tenderness: There is abdominal tenderness in the right lower quadrant, suprapubic area and left lower quadrant.  Musculoskeletal:        General: No swelling.     Cervical back: Neck supple.  Skin:    General: Skin is warm and dry.     Capillary Refill: Capillary refill takes less than 2  seconds.  Neurological:     Mental Status: He is alert.  Psychiatric:        Mood and Affect: Mood normal.     ED Results / Procedures / Treatments   Labs (all labs ordered are listed, but only abnormal results are displayed) Labs Reviewed  COMPREHENSIVE METABOLIC PANEL - Abnormal; Notable for the following components:      Result Value   Glucose, Bld 101 (*)    Creatinine, Ser 1.37 (*)    All other components within normal limits  CBC - Abnormal; Notable for the following components:   Hemoglobin 12.1 (*)    HCT 38.5 (*)    MCV 77.0 (*)    MCH 24.2 (*)    All other components within normal limits  URINALYSIS, ROUTINE W REFLEX MICROSCOPIC - Abnormal; Notable for the following components:   Specific Gravity, Urine 1.035 (*)    All other components within normal limits  LIPASE, BLOOD    EKG None  Radiology CT ABDOMEN PELVIS W CONTRAST Result Date: 07/12/2023 CLINICAL DATA:  Acute nonlocalized abdominal pain EXAM: CT ABDOMEN AND PELVIS WITH CONTRAST TECHNIQUE: Multidetector CT imaging of the abdomen and pelvis was performed using the standard protocol following bolus administration of intravenous contrast. RADIATION DOSE REDUCTION: This exam was performed according to the departmental dose-optimization program which includes automated exposure control, adjustment of the mA and/or kV according to patient size and/or use of iterative reconstruction technique. CONTRAST:  75mL OMNIPAQUE IOHEXOL 350 MG/ML SOLN COMPARISON:  None Available. FINDINGS: Lower chest: No acute abnormality. Hepatobiliary: No focal liver abnormality is seen. No gallstones, gallbladder wall thickening, or biliary dilatation. Pancreas: Unremarkable Spleen: There is mild splenomegaly present with the spleen measuring 14.5 cm in greatest dimension. No intrasplenic lesions are seen. Adrenals/Urinary Tract: Adrenal glands are unremarkable. Kidneys are normal, without renal calculi, focal lesion, or hydronephrosis.  Bladder is unremarkable. Stomach/Bowel: Stomach is within normal limits. Appendix appears normal. No evidence of bowel wall thickening, distention, or inflammatory changes. Vascular/Lymphatic: No significant vascular findings are present. No enlarged abdominal or pelvic lymph nodes. Reproductive: Prostate is unremarkable. Other: Tiny fat containing umbilical hernia. No abdominopelvic ascites. Musculoskeletal: No acute or significant osseous findings. IMPRESSION: 1. No acute intra-abdominal pathology identified. No definite radiographic explanation for the patient's reported symptoms. 2. Mild splenomegaly. Electronically Signed   By: Helyn Numbers M.D.   On: 07/12/2023 02:17    Procedures Procedures    Medications Ordered in ED Medications  ondansetron (ZOFRAN-ODT) disintegrating tablet 8 mg (8 mg Oral Given 07/11/23 2319)  morphine (PF) 4 MG/ML injection 4 mg (4 mg Intravenous Given 07/12/23 0140)  ondansetron (ZOFRAN) injection 4 mg (4 mg Intravenous Given 07/12/23 0140)  iohexol (OMNIPAQUE) 350 MG/ML injection 75 mL (75 mLs Intravenous Contrast Given 07/12/23  0205)  sodium chloride 0.9 % bolus 1,000 mL (0 mLs Intravenous Stopped 07/12/23 0418)    ED Course/ Medical Decision Making/ A&P                                 Medical Decision Making Amount and/or Complexity of Data Reviewed Radiology: ordered.  Risk Prescription drug management.   This patient presents to the ED for concern of abdominal pain with urinary retention, this involves an extensive number of treatment options, and is a complaint that carries with it a high risk of complications and morbidity.  The differential diagnosis includes urinary retention, cystitis, appendicitis, gastritis, others   Co morbidities that complicate the patient evaluation  Urinary retention, factitious disorder   Additional history obtained:   External records from outside source obtained and reviewed including behavioral health  notes   Lab Tests:  I Ordered, and personally interpreted labs.  The pertinent results include: Hemoglobin 12.1, creatinine 1.37 consistent with previous visit, lipase 38   Imaging Studies ordered:  I ordered imaging studies including CT abdomen pelvis with contrast I independently visualized and interpreted imaging which showed no acute findings to explain symptoms I agree with the radiologist interpretation   Problem List / ED Course / Critical interventions / Medication management   I ordered medication including morphine for pain, zofran for nausea, normal saline for fluid resuscitation  Reevaluation of the patient after these medicines showed that the patient improved I have reviewed the patients home medicines and have made adjustments as needed   Social Determinants of Health:  Good social support   Test / Admission - Considered:  Patient presents again with urinary retention.  He has significant pain relief after catheter is placed and urine is drained.  Still unclear as to etiology of patient's retention but question if it may be medication related.  Patient has a scheduled appointment with urology for this Friday.  Since he has now failed a trial of no catheter with it being removed by family medicine, strongly recommend the catheter stay in place until urology evaluation.  Patient voices understanding with plan.  Return precautions provided including inability to urinate through catheter.  Patient discharged home         Final Clinical Impression(s) / ED Diagnoses Final diagnoses:  Acute urinary retention    Rx / DC Orders ED Discharge Orders     None         Pamala Duffel 07/12/23 4259    Zadie Rhine, MD 07/12/23 2304

## 2023-07-12 NOTE — ED Provider Notes (Addendum)
[place] EMERGENCY DEPARTMENT AT [facility] Provider Note   CSN: [id number] Arrival date & time: 07/12/23  7829     History  Chief Complaint  Patient presents with   Abdominal Pain    Vincent Frank is a 22 y.o. male.  HPI     22 year old patient comes with a chief complaint of lower quadrant abdominal pain.  Patient's pain has been present for 2 days.  The pain is constant, generalized, worse over the lower quadrants.  Patient has no history of abdominal surgeries.  Review of system is positive for nausea with emesis x 3 about 2 days ago.  His last bowel movement was yesterday and it was normal.  Patient was seen in the ER for urinary retention yesterday, and he had indicated that he has had urinary retention since November 17 after traumatic event.  He has been self cathing since then and has a urology follow-up.  Patient is on SSRI.  He is also on antipsychotics, but they are newer antipsychotics.  Home Medications Prior to Admission medications   Medication Sig Start Date End Date Taking? Authorizing Provider  acetaminophen (TYLENOL) 500 MG tablet Take 1 tablet (500 mg total) by mouth every 6 (six) hours as needed. 07/12/23  Yes Artis Beggs, MD  naproxen (NAPROSYN) 500 MG tablet Take 1 tablet (500 mg total) by mouth 2 (two) times daily. 07/12/23  Yes Derwood Kaplan, MD  oxybutynin (DITROPAN) 5 MG tablet Take 1 tablet (5 mg total) by mouth 3 (three) times daily. 07/12/23  Yes Derwood Kaplan, MD  oxyCODONE-acetaminophen (PERCOCET/ROXICET) 5-325 MG tablet Take 1 tablet by mouth every 8 (eight) hours as needed for severe pain (pain score 7-10). 07/12/23  Yes Derwood Kaplan, MD  ARIPiprazole (ABILIFY) 5 MG tablet Take 1 tablet (5 mg total) by mouth at bedtime for 14 days. 06/05/23 06/19/23  Massengill, [name], MD  esomeprazole (NEXIUM) 40 MG capsule Take 1 capsule (40 mg total) by mouth daily. 06/09/23   Charlynne Pander, MD  FLUoxetine (PROZAC) 20 MG  capsule Take 3 capsules (60 mg total) by mouth daily for 14 days. 06/06/23 06/20/23  Massengill, [name], MD  hydrOXYzine (ATARAX) 25 MG tablet Take 1 tablet (25 mg total) by mouth 3 (three) times daily as needed for anxiety. 06/05/23   Massengill, [name], MD  lamoTRIgine (LAMICTAL) 25 MG tablet Take 1 tablet (25 mg total) by mouth daily for 14 days. 06/06/23 06/20/23  Massengill, [name], MD  levETIRAcetam (KEPPRA) 500 MG tablet Take 500 mg by mouth 2 (two) times daily.    [provider]  naproxen sodium (ALEVE) 220 MG tablet Take 220-440 mg by mouth daily as needed (Pain).    [provider]  traZODone (DESYREL) 50 MG tablet Take 1 tablet (50 mg total) by mouth at bedtime. Patient taking differently: Take 50 mg by mouth at bedtime as needed for sleep. 06/05/23   Massengill, [name], MD      Allergies    Bee venom, Haldol [haloperidol], Hornet venom, and Gabapentin    Review of Systems   Review of Systems  All other systems reviewed and are negative.   Physical Exam Updated Vital Signs BP 124/74 (BP Location: Right Arm)   Pulse 74   Temp 98.2 F (36.8 C) (Oral)   Resp 16   Ht 6\' 3"  (1.905 m)   Wt 113.4 kg   SpO2 98%   BMI 31.25 kg/m  Physical Exam Vitals and nursing note reviewed.  Constitutional:  Appearance: He is well-developed.  HENT:     Head: Atraumatic.  Cardiovascular:     Rate and Rhythm: Normal rate.  Pulmonary:     Effort: Pulmonary effort is normal.  Abdominal:     Tenderness: There is generalized abdominal tenderness and tenderness in the right lower quadrant, suprapubic area and left lower quadrant. There is guarding. There is no rebound. Negative signs include McBurney's sign.  Musculoskeletal:     Cervical back: Neck supple.  Skin:    General: Skin is warm.  Neurological:     Mental Status: He is alert and oriented to person, place, and time.     ED Results / Procedures / Treatments   Labs (all labs ordered are listed, but only  abnormal results are displayed) Labs Reviewed  CBC WITH DIFFERENTIAL/PLATELET - Abnormal; Notable for the following components:      Result Value   Hemoglobin 11.7 (*)    HCT 36.6 (*)    MCV 75.6 (*)    MCH 24.2 (*)    All other components within normal limits  COMPREHENSIVE METABOLIC PANEL - Abnormal; Notable for the following components:   Creatinine, Ser 1.36 (*)    All other components within normal limits  URINE CULTURE  URINALYSIS, ROUTINE W REFLEX MICROSCOPIC    EKG None  Radiology CT ABDOMEN PELVIS W CONTRAST Result Date: 07/12/2023 CLINICAL DATA:  Acute nonlocalized abdominal pain EXAM: CT ABDOMEN AND PELVIS WITH CONTRAST TECHNIQUE: Multidetector CT imaging of the abdomen and pelvis was performed using the standard protocol following bolus administration of intravenous contrast. RADIATION DOSE REDUCTION: This exam was performed according to the departmental dose-optimization program which includes automated exposure control, adjustment of the mA and/or kV according to patient size and/or use of iterative reconstruction technique. CONTRAST:  75mL OMNIPAQUE IOHEXOL 350 MG/ML SOLN COMPARISON:  None Available. FINDINGS: Lower chest: No acute abnormality. Hepatobiliary: No focal liver abnormality is seen. No gallstones, gallbladder wall thickening, or biliary dilatation. Pancreas: Unremarkable Spleen: There is mild splenomegaly present with the spleen measuring 14.5 cm in greatest dimension. No intrasplenic lesions are seen. Adrenals/Urinary Tract: Adrenal glands are unremarkable. Kidneys are normal, without renal calculi, focal lesion, or hydronephrosis. Bladder is unremarkable. Stomach/Bowel: Stomach is within normal limits. Appendix appears normal. No evidence of bowel wall thickening, distention, or inflammatory changes. Vascular/Lymphatic: No significant vascular findings are present. No enlarged abdominal or pelvic lymph nodes. Reproductive: Prostate is unremarkable. Other: Tiny fat  containing umbilical hernia. No abdominopelvic ascites. Musculoskeletal: No acute or significant osseous findings. IMPRESSION: 1. No acute intra-abdominal pathology identified. No definite radiographic explanation for the patient's reported symptoms. 2. Mild splenomegaly. Electronically Signed   By: Helyn Numbers M.D.   On: 07/12/2023 02:17    Procedures Procedures    Medications Ordered in ED Medications  oxybutynin (DITROPAN) tablet 5 mg (has no administration in time range)  ketorolac (TORADOL) 15 MG/ML injection 15 mg (has no administration in time range)  morphine (PF) 4 MG/ML injection 4 mg (has no administration in time range)  morphine (PF) 4 MG/ML injection 4 mg (4 mg Intravenous Given 07/12/23 1201)    ED Course/ Medical Decision Making/ A&P                                 Medical Decision Making Amount and/or Complexity of Data Reviewed Labs: ordered.  Risk OTC drugs. Prescription drug management.   This patient presents to the ED with  chief complaint(s) of generalized abdominal pain worse over the lower quadrant with pertinent past medical history of recent ER visit where he had a CT scan, MRI of his spine and found to have urinary retention..The complaint involves an extensive differential diagnosis and also carries with it a high risk of complications and morbidity.    The differential diagnosis includes : Bladder spasms because of acute urinary retention, acute cystitis, medication side effects leading to urinary retention.  Patient just had a CT abdomen pelvis with contrast yesterday which was reassuring.  No need for repeat imaging.  The initial plan is to get basic labs to make sure there is no renal failure.  UA also ordered to evaluate for infection.   Additional history obtained: Records reviewed  previous CT scan  Independent labs interpretation:  The following labs were independently interpreted: CBC, Bolick profile and urinalysis are  normal.   Treatment and Reassessment: Per our ED tech, patient had 380 cc on bladder scan.  Upon catheterization request, patient preferred In-N-Out cath.  At about 70 cc, the urine output started becoming more of a dribble and at 80 cc, while urine was still dribbling out patient requested that they remove the catheter. Patient continues to have significant discomfort.  I discussed the case with Dr. Annabell Howells, urology. His recommendations are that if patient not tolerate Foley catheter, then we put him on oxybutynin if patient has retention otherwise put him on Pyridium.  My suspicion is that this pain is related to spasms, the caveat is that patient states that Foley catheter did not help him with this pain.  From my judgment, it would be best to start him on oxybutynin and have him follow-up with urologist as planned.  Consultation: - Consulted or discussed management/test interpretation with external professional: Urology  -see the recommendations above.   Final Clinical Impression(s) / ED Diagnoses Final diagnoses:  Urinary retention  Painful bladder spasm    Rx / DC Orders ED Discharge Orders          Ordered    oxybutynin (DITROPAN) 5 MG tablet  3 times daily        07/12/23 1327    oxyCODONE-acetaminophen (PERCOCET/ROXICET) 5-325 MG tablet  Every 8 hours PRN        07/12/23 1327    acetaminophen (TYLENOL) 500 MG tablet  Every 6 hours PRN        07/12/23 1327    naproxen (NAPROSYN) 500 MG tablet  2 times daily        07/12/23 1327              Derwood Kaplan, MD 07/12/23 1331

## 2023-07-12 NOTE — ED Notes (Signed)
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 VS updated. Saline lock D/C.

## 2023-07-12 NOTE — Discharge Instructions (Signed)
You were seen in the emergency room for pain that we suspect is because of severe bladder spasms.  The blood work and urine analysis are normal.  Based on consultation with urology, we are starting you on oxybutynin.  Also additional pain medicine has been prescribed for symptom relief.  Return to the ER if your symptoms get worse.

## 2023-07-13 ENCOUNTER — Encounter (HOSPITAL_BASED_OUTPATIENT_CLINIC_OR_DEPARTMENT_OTHER): Payer: Self-pay | Admitting: Emergency Medicine

## 2023-07-13 ENCOUNTER — Other Ambulatory Visit: Payer: Self-pay

## 2023-07-13 ENCOUNTER — Encounter (HOSPITAL_COMMUNITY): Payer: Self-pay

## 2023-07-13 ENCOUNTER — Emergency Department (HOSPITAL_COMMUNITY)
Admission: EM | Admit: 2023-07-13 | Discharge: 2023-07-14 | Disposition: A | Discharge status: Home / Self Care | Payer: BC Managed Care – PPO | Source: Home / Self Care | Attending: Emergency Medicine | Admitting: Emergency Medicine

## 2023-07-13 ENCOUNTER — Emergency Department (HOSPITAL_BASED_OUTPATIENT_CLINIC_OR_DEPARTMENT_OTHER)
Admission: EM | Admit: 2023-07-13 | Discharge: 2023-07-13 | Disposition: A | Discharge status: Home / Self Care | Payer: BC Managed Care – PPO | Source: Home / Self Care | Attending: Emergency Medicine | Admitting: Emergency Medicine

## 2023-07-13 ENCOUNTER — Emergency Department (HOSPITAL_BASED_OUTPATIENT_CLINIC_OR_DEPARTMENT_OTHER)
Admission: EM | Admit: 2023-07-13 | Discharge: 2023-07-13 | Disposition: A | Discharge status: Home / Self Care | Payer: BC Managed Care – PPO | Attending: Emergency Medicine | Admitting: Emergency Medicine

## 2023-07-13 DIAGNOSIS — R1030 Lower abdominal pain, unspecified: Secondary | ICD-10-CM | POA: Insufficient documentation

## 2023-07-13 DIAGNOSIS — R42 Dizziness and giddiness: Secondary | ICD-10-CM | POA: Diagnosis not present

## 2023-07-13 DIAGNOSIS — R339 Retention of urine, unspecified: Secondary | ICD-10-CM | POA: Insufficient documentation

## 2023-07-13 DIAGNOSIS — R1084 Generalized abdominal pain: Secondary | ICD-10-CM | POA: Insufficient documentation

## 2023-07-13 DIAGNOSIS — R109 Unspecified abdominal pain: Secondary | ICD-10-CM | POA: Diagnosis present

## 2023-07-13 DIAGNOSIS — R55 Syncope and collapse: Secondary | ICD-10-CM | POA: Diagnosis present

## 2023-07-13 DIAGNOSIS — E876 Hypokalemia: Secondary | ICD-10-CM | POA: Insufficient documentation

## 2023-07-13 DIAGNOSIS — R110 Nausea: Secondary | ICD-10-CM | POA: Insufficient documentation

## 2023-07-13 LAB — CBC
HCT: 40.9 % (ref 39.0–52.0)
Hemoglobin: 12.7 g/dL — ABNORMAL LOW (ref 13.0–17.0)
MCH: 23.9 pg — ABNORMAL LOW (ref 26.0–34.0)
MCHC: 31.1 g/dL (ref 30.0–36.0)
MCV: 76.9 fL — ABNORMAL LOW (ref 80.0–100.0)
Platelets: 247 10*3/uL (ref 150–400)
RBC: 5.32 MIL/uL (ref 4.22–5.81)
RDW: 14.2 % (ref 11.5–15.5)
WBC: 5.7 10*3/uL (ref 4.0–10.5)
nRBC: 0 % (ref 0.0–0.2)

## 2023-07-13 LAB — COMPREHENSIVE METABOLIC PANEL
ALT: 27 U/L (ref 0–44)
AST: 26 U/L (ref 15–41)
Albumin: 4.9 g/dL (ref 3.5–5.0)
Alkaline Phosphatase: 70 U/L (ref 38–126)
Anion gap: 9 (ref 5–15)
BUN: 13 mg/dL (ref 6–20)
CO2: 27 mmol/L (ref 22–32)
Calcium: 10.3 mg/dL (ref 8.9–10.3)
Chloride: 106 mmol/L (ref 98–111)
Creatinine, Ser: 1.3 mg/dL — ABNORMAL HIGH (ref 0.61–1.24)
GFR, Estimated: >60 mL/min (ref >60)
Glucose, Bld: 90 mg/dL (ref 70–99)
Potassium: 4.4 mmol/L (ref 3.5–5.1)
Sodium: 142 mmol/L (ref 135–145)
Total Bilirubin: 0.5 mg/dL (ref <1.2)
Total Protein: 8 g/dL (ref 6.5–8.1)

## 2023-07-13 LAB — CBG MONITORING, ED: Glucose-Capillary: 96 mg/dL (ref 70–99)

## 2023-07-13 MED ORDER — SODIUM CHLORIDE 0.9 % IV SOLN: INTRAVENOUS | Status: DC

## 2023-07-13 MED ORDER — SODIUM CHLORIDE 0.9 % IV BOLUS
1000.0000 mL | Freq: Once | INTRAVENOUS | Status: AC
  Administered 2023-07-13: 1000 mL via INTRAVENOUS

## 2023-07-13 MED ORDER — METOCLOPRAMIDE HCL 5 MG/ML IJ SOLN
5.0000 mg | Freq: Once | INTRAMUSCULAR | Status: AC
  Administered 2023-07-13: 5 mg via INTRAVENOUS
  Filled 2023-07-13: qty 2

## 2023-07-13 MED ORDER — MORPHINE SULFATE (PF) 2 MG/ML IV SOLN
INTRAVENOUS | Status: AC
  Filled 2023-07-13: qty 1

## 2023-07-13 MED ORDER — LORAZEPAM 2 MG/ML IJ SOLN
0.5000 mg | Freq: Once | INTRAMUSCULAR | Status: AC
  Administered 2023-07-13: 0.5 mg via INTRAVENOUS
  Filled 2023-07-13: qty 1

## 2023-07-13 MED ORDER — MORPHINE SULFATE (PF) 4 MG/ML IV SOLN
8.0000 mg | Freq: Once | INTRAVENOUS | Status: AC
Start: 2023-07-13 — End: 2023-07-13
  Administered 2023-07-13: 8 mg via INTRAVENOUS
  Filled 2023-07-13: qty 2

## 2023-07-13 NOTE — ED Provider Notes (Signed)
[place] EMERGENCY DEPARTMENT AT [facility] Provider Note   CSN: [id number] Arrival date & time: 07/13/23  1144     History  Chief Complaint  Patient presents with   Loss of Consciousness    Vincent Frank is a 22 y.o. male.  22 year old male presents after having syncopal event at work today.  Patient states he has been having chronic abdominal pain for over a month.  Is been seen in the ED multiple times for same.  No clear etiology has been found.  States that today at work he had severe abdominal cramping followed by becoming lightheaded and dizzy and then he had a syncopal event.  It was brief in nature and not followed by a postictal period.  He notes no severe head trauma from this.  Denies any neck pain.  No bowel or bladder dysfunction.  No tongue biting.  States his abdominal pain has been very frustrating for him.  Has had emesis x 2.  Denies any fever or chills.       Home Medications Prior to Admission medications   Medication Sig Start Date End Date Taking? Authorizing Provider  ciprofloxacin (CIPRO) 500 MG tablet Take 500 mg by mouth 2 (two) times daily. 07/05/23  Yes [provider]  folic acid (FOLVITE) 1 MG tablet Take 1 mg by mouth daily. 07/06/23  Yes [provider]  tamsulosin (FLOMAX) 0.4 MG CAPS capsule Take 0.8 mg by mouth daily. 07/07/23  Yes [provider]  acetaminophen (TYLENOL) 500 MG tablet Take 1 tablet (500 mg total) by mouth every 6 (six) hours as needed. 07/12/23   Derwood Kaplan, MD  ARIPiprazole (ABILIFY) 5 MG tablet Take 1 tablet (5 mg total) by mouth at bedtime for 14 days. 06/05/23 06/19/23  Massengill, [name], MD  esomeprazole (NEXIUM) 40 MG capsule Take 1 capsule (40 mg total) by mouth daily. 06/09/23   Charlynne Pander, MD  FLUoxetine (PROZAC) 20 MG capsule Take 3 capsules (60 mg total) by mouth daily for 14 days. 06/06/23 06/20/23  Massengill, [name], MD  hydrOXYzine (ATARAX) 25 MG tablet  Take 1 tablet (25 mg total) by mouth 3 (three) times daily as needed for anxiety. 06/05/23   Massengill, [name], MD  lamoTRIgine (LAMICTAL) 25 MG tablet Take 1 tablet (25 mg total) by mouth daily for 14 days. 06/06/23 06/20/23  Massengill, [name], MD  levETIRAcetam (KEPPRA) 500 MG tablet Take 500 mg by mouth 2 (two) times daily.    [provider]  naproxen (NAPROSYN) 500 MG tablet Take 1 tablet (500 mg total) by mouth 2 (two) times daily. 07/12/23   Derwood Kaplan, MD  naproxen sodium (ALEVE) 220 MG tablet Take 220-440 mg by mouth daily as needed (Pain).    [provider]  oxybutynin (DITROPAN) 5 MG tablet Take 1 tablet (5 mg total) by mouth 3 (three) times daily. 07/12/23   Derwood Kaplan, MD  oxyCODONE-acetaminophen (PERCOCET/ROXICET) 5-325 MG tablet Take 1 tablet by mouth every 8 (eight) hours as needed for severe pain (pain score 7-10). 07/12/23   Derwood Kaplan, MD  traZODone (DESYREL) 50 MG tablet Take 1 tablet (50 mg total) by mouth at bedtime. Patient taking differently: Take 50 mg by mouth at bedtime as needed for sleep. 06/05/23   Massengill, [name], MD      Allergies    Bee venom, Haldol [haloperidol], Hornet venom, and Gabapentin    Review of Systems   Review of Systems  All other systems reviewed and are negative.  Physical Exam Updated Vital Signs BP 126/80   Pulse 97   Temp 97.6 F (36.4 C)   Resp 18   SpO2 99%  Physical Exam Vitals and nursing note reviewed.  Constitutional:      General: He is not in acute distress.    Appearance: Normal appearance. He is well-developed. He is not toxic-appearing.  HENT:     Head: Normocephalic and atraumatic.  Eyes:     General: Lids are normal.     Conjunctiva/sclera: Conjunctivae normal.     Pupils: Pupils are equal, round, and reactive to light.  Neck:     Thyroid: No thyroid mass.     Trachea: No tracheal deviation.  Cardiovascular:     Rate and Rhythm: Normal rate and regular rhythm.     Heart  sounds: Normal heart sounds. No murmur heard.    No gallop.  Pulmonary:     Effort: Pulmonary effort is normal. No respiratory distress.     Breath sounds: Normal breath sounds. No stridor. No decreased breath sounds, wheezing, rhonchi or rales.  Abdominal:     General: There is no distension.     Palpations: Abdomen is soft.     Tenderness: There is no abdominal tenderness. There is no rebound.  Musculoskeletal:        General: No tenderness. Normal range of motion.     Cervical back: Normal range of motion and neck supple.  Skin:    General: Skin is warm and dry.     Findings: No abrasion or rash.  Neurological:     General: No focal deficit present.     Mental Status: He is alert and oriented to person, place, and time. Mental status is at baseline.     GCS: GCS eye subscore is 4. GCS verbal subscore is 5. GCS motor subscore is 6.     Cranial Nerves: No cranial nerve deficit.     Sensory: No sensory deficit.     Motor: Motor function is intact.  Psychiatric:        Attention and Perception: Attention normal.        Speech: Speech normal.        Behavior: Behavior normal.     ED Results / Procedures / Treatments   Labs (all labs ordered are listed, but only abnormal results are displayed) Labs Reviewed  COMPREHENSIVE METABOLIC PANEL  CBC  CBG MONITORING, ED  CBG MONITORING, ED    EKG EKG Interpretation Date/Time:  Tuesday July 13 2023 12:14:59 EST Ventricular Rate:  90 PR Interval:  152 QRS Duration:  96 QT Interval:  366 QTC Calculation: 447 R Axis:   74  Text Interpretation: Normal sinus rhythm Normal ECG When compared with ECG of 09-Jun-2023 05:59, PREVIOUS ECG IS PRESENT No significant change since last tracing Confirmed by Lorre Nick ([id number]) on 07/13/2023 12:53:35 PM  Radiology CT ABDOMEN PELVIS W CONTRAST Result Date: 07/12/2023 CLINICAL DATA:  Acute nonlocalized abdominal pain EXAM: CT ABDOMEN AND PELVIS WITH CONTRAST TECHNIQUE: Multidetector CT  imaging of the abdomen and pelvis was performed using the standard protocol following bolus administration of intravenous contrast. RADIATION DOSE REDUCTION: This exam was performed according to the departmental dose-optimization program which includes automated exposure control, adjustment of the mA and/or kV according to patient size and/or use of iterative reconstruction technique. CONTRAST:  75mL OMNIPAQUE IOHEXOL 350 MG/ML SOLN COMPARISON:  None Available. FINDINGS: Lower chest: No acute abnormality. Hepatobiliary: No focal liver abnormality is seen. No gallstones, gallbladder wall  thickening, or biliary dilatation. Pancreas: Unremarkable Spleen: There is mild splenomegaly present with the spleen measuring 14.5 cm in greatest dimension. No intrasplenic lesions are seen. Adrenals/Urinary Tract: Adrenal glands are unremarkable. Kidneys are normal, without renal calculi, focal lesion, or hydronephrosis. Bladder is unremarkable. Stomach/Bowel: Stomach is within normal limits. Appendix appears normal. No evidence of bowel wall thickening, distention, or inflammatory changes. Vascular/Lymphatic: No significant vascular findings are present. No enlarged abdominal or pelvic lymph nodes. Reproductive: Prostate is unremarkable. Other: Tiny fat containing umbilical hernia. No abdominopelvic ascites. Musculoskeletal: No acute or significant osseous findings. IMPRESSION: 1. No acute intra-abdominal pathology identified. No definite radiographic explanation for the patient's reported symptoms. 2. Mild splenomegaly. Electronically Signed   By: Helyn Numbers M.D.   On: 07/12/2023 02:17    Procedures Procedures    Medications Ordered in ED Medications  0.9 %  sodium chloride infusion (has no administration in time range)  sodium chloride 0.9 % bolus 1,000 mL (has no administration in time range)    ED Course/ Medical Decision Making/ A&P                                 Medical Decision Making Amount and/or  Complexity of Data Reviewed Labs: ordered.  Risk Prescription drug management.   Patient's EKG per interpretation shows normal sinus rhythm.  Patient's labs here are reassuring.  Treated for his symptoms with IV fluids, morphine, Ativan Reglan.  Feels better at this time.  Suspect that he likely had a vasovagal episode from his abdominal discomfort.  No suspicion for PE or subarachnoid hemorrhage.  Has no evidence of head trauma and therefore did not get a head CT.  Low suspicion for seizure.  Patient has been having this abdominal discomfort for the past month and has been seen by multiple providers without a diagnosis unfortunately.  Will discharge home and he will follow-up with his doctor        Final Clinical Impression(s) / ED Diagnoses Final diagnoses:  None    Rx / DC Orders ED Discharge Orders     None         Lorre Nick, MD 07/13/23 1438

## 2023-07-13 NOTE — ED Provider Notes (Signed)
[place] EMERGENCY DEPARTMENT AT [facility] Provider Note   CSN: [id number] Arrival date & time: 07/13/23  1953     History  Chief Complaint  Patient presents with   Seizures    Vincent Frank is a 22 y.o. male.   Seizures Patient presents complaining of abdominal pain.  Has had multiple recent visits for same.  This is his fourth visit in the last 3 days and second visit today for similar symptoms.  States diffuse abdominal pain.  Worse with eating.  Also some dysuria.  Self caths and states that that has become more painful.  No fevers.  Had CT scan yesterday that was negative.  Also states has had seizures.  Had a vasovagal syncope earlier today but also has history of nonepileptic seizures.  Has had events caught more than once on EEG without any epileptic events.    Past Medical History:  Diagnosis Date   Major depressive disorder    Seizures (HCC)     Home Medications Prior to Admission medications   Medication Sig Start Date End Date Taking? Authorizing Provider  acetaminophen (TYLENOL) 500 MG tablet Take 1 tablet (500 mg total) by mouth every 6 (six) hours as needed. 07/12/23   Derwood Kaplan, MD  ARIPiprazole (ABILIFY) 5 MG tablet Take 1 tablet (5 mg total) by mouth at bedtime for 14 days. 06/05/23 07/13/23  Massengill, [name], MD  ciprofloxacin (CIPRO) 500 MG tablet Take 500 mg by mouth 2 (two) times daily. 07/05/23   [provider]  esomeprazole (NEXIUM) 40 MG capsule Take 1 capsule (40 mg total) by mouth daily. Patient not taking: Reported on 07/13/2023 06/09/23   Charlynne Pander, MD  FLUoxetine (PROZAC) 20 MG capsule Take 3 capsules (60 mg total) by mouth daily for 14 days. 06/06/23 07/13/23  Massengill, [name], MD  folic acid (FOLVITE) 1 MG tablet Take 1 mg by mouth daily. 07/06/23   [provider]  hydrOXYzine (ATARAX) 25 MG tablet Take 1 tablet (25 mg total) by mouth 3 (three) times daily as needed for anxiety. 06/05/23    Massengill, [name], MD  lamoTRIgine (LAMICTAL) 25 MG tablet Take 1 tablet (25 mg total) by mouth daily for 14 days. 06/06/23 07/13/23  Massengill, [name], MD  levETIRAcetam (KEPPRA) 500 MG tablet Take 500 mg by mouth 2 (two) times daily.    [provider]  naproxen (NAPROSYN) 500 MG tablet Take 1 tablet (500 mg total) by mouth 2 (two) times daily. 07/12/23   Derwood Kaplan, MD  naproxen sodium (ALEVE) 220 MG tablet Take 220-440 mg by mouth daily as needed (Pain).    [provider]  oxybutynin (DITROPAN) 5 MG tablet Take 1 tablet (5 mg total) by mouth 3 (three) times daily. 07/12/23   Derwood Kaplan, MD  oxyCODONE-acetaminophen (PERCOCET/ROXICET) 5-325 MG tablet Take 1 tablet by mouth every 8 (eight) hours as needed for severe pain (pain score 7-10). 07/12/23   Derwood Kaplan, MD  traZODone (DESYREL) 50 MG tablet Take 1 tablet (50 mg total) by mouth at bedtime. Patient taking differently: Take 50 mg by mouth at bedtime as needed for sleep. 06/05/23   Massengill, [name], MD      Allergies    Bee venom, Haldol [haloperidol], Hornet venom, and Gabapentin    Review of Systems   Review of Systems  Neurological:  Positive for seizures.    Physical Exam Updated Vital Signs BP (!) 140/119 (BP Location: Left Arm)   Pulse 83   Temp 98 F (  36.7 C) (Oral)   Resp (!) 27   SpO2 96%  Physical Exam Vitals and nursing note reviewed.  HENT:     Head: Normocephalic.  Cardiovascular:     Rate and Rhythm: Regular rhythm.  Pulmonary:     Breath sounds: No wheezing.  Abdominal:     Tenderness: There is no abdominal tenderness.  Musculoskeletal:        General: Normal range of motion.     Cervical back: Neck supple.  Skin:    General: Skin is warm.     Capillary Refill: Capillary refill takes more than 3 seconds.  Neurological:     Mental Status: He is alert and oriented to person, place, and time.     ED Results / Procedures / Treatments   Labs (all labs ordered are  listed, but only abnormal results are displayed) Labs Reviewed - No data to display  EKG None  Radiology CT ABDOMEN PELVIS W CONTRAST Result Date: 07/12/2023 CLINICAL DATA:  Acute nonlocalized abdominal pain EXAM: CT ABDOMEN AND PELVIS WITH CONTRAST TECHNIQUE: Multidetector CT imaging of the abdomen and pelvis was performed using the standard protocol following bolus administration of intravenous contrast. RADIATION DOSE REDUCTION: This exam was performed according to the departmental dose-optimization program which includes automated exposure control, adjustment of the mA and/or kV according to patient size and/or use of iterative reconstruction technique. CONTRAST:  75mL OMNIPAQUE IOHEXOL 350 MG/ML SOLN COMPARISON:  None Available. FINDINGS: Lower chest: No acute abnormality. Hepatobiliary: No focal liver abnormality is seen. No gallstones, gallbladder wall thickening, or biliary dilatation. Pancreas: Unremarkable Spleen: There is mild splenomegaly present with the spleen measuring 14.5 cm in greatest dimension. No intrasplenic lesions are seen. Adrenals/Urinary Tract: Adrenal glands are unremarkable. Kidneys are normal, without renal calculi, focal lesion, or hydronephrosis. Bladder is unremarkable. Stomach/Bowel: Stomach is within normal limits. Appendix appears normal. No evidence of bowel wall thickening, distention, or inflammatory changes. Vascular/Lymphatic: No significant vascular findings are present. No enlarged abdominal or pelvic lymph nodes. Reproductive: Prostate is unremarkable. Other: Tiny fat containing umbilical hernia. No abdominopelvic ascites. Musculoskeletal: No acute or significant osseous findings. IMPRESSION: 1. No acute intra-abdominal pathology identified. No definite radiographic explanation for the patient's reported symptoms. 2. Mild splenomegaly. Electronically Signed   By: Helyn Numbers M.D.   On: 07/12/2023 02:17    Procedures Procedures    Medications Ordered in  ED Medications - No data to display  ED Course/ Medical Decision Making/ A&P                                 Medical Decision Making  Patient with abdominal pain.  Has had recent evaluation for same.  Blood work reassuring.  Creatinine mildly elevated but appears as where it has been over the last month.  Has normal white count.  CT scan done and yesterday was negative.  Do not think we need further workup at this time.  Benign abdominal exam.  Potentially has some bladder spasm.  Has treatment for that.  Can follow-up with urology.  History of nonepileptic seizures.  No current activity and do not think we need further workup for that.  Will discharge.       Final Clinical Impression(s) / ED Diagnoses Final diagnoses:  Generalized abdominal pain    Rx / DC Orders ED Discharge Orders     None         Benjiman Core, MD 07/13/23  2207  

## 2023-07-13 NOTE — ED Triage Notes (Addendum)
Pt states that he has had generalized abdominal pain x 2 days. Pt has been seen twice at [facility] today, but reports he is here because his pain has continued to worsen.

## 2023-07-13 NOTE — ED Triage Notes (Signed)
Seizure (multiple) Started after being d/c earlier today for LOC episode.  Severe generalized abdominal pain. Worse then before

## 2023-07-13 NOTE — ED Triage Notes (Signed)
Reports falling and hitting head around 7 AM. LOC about 5 mins per patient. Dizziness,  Abdo pain generalized started weeks ago.  Seizure in the past, witness states "didn't look like other seizures"

## 2023-07-14 ENCOUNTER — Encounter (HOSPITAL_BASED_OUTPATIENT_CLINIC_OR_DEPARTMENT_OTHER): Payer: Self-pay

## 2023-07-14 ENCOUNTER — Other Ambulatory Visit: Payer: Self-pay

## 2023-07-14 DIAGNOSIS — R109 Unspecified abdominal pain: Secondary | ICD-10-CM | POA: Diagnosis present

## 2023-07-14 DIAGNOSIS — Z1152 Encounter for screening for COVID-19: Secondary | ICD-10-CM | POA: Insufficient documentation

## 2023-07-14 DIAGNOSIS — R1084 Generalized abdominal pain: Secondary | ICD-10-CM | POA: Insufficient documentation

## 2023-07-14 DIAGNOSIS — Z91010 Allergy to peanuts: Secondary | ICD-10-CM | POA: Diagnosis not present

## 2023-07-14 DIAGNOSIS — R569 Unspecified convulsions: Secondary | ICD-10-CM | POA: Diagnosis not present

## 2023-07-14 DIAGNOSIS — R251 Tremor, unspecified: Secondary | ICD-10-CM | POA: Diagnosis present

## 2023-07-14 LAB — CBC
HCT: 35 % — ABNORMAL LOW (ref 39.0–52.0)
Hemoglobin: 11 g/dL — ABNORMAL LOW (ref 13.0–17.0)
MCH: 24.3 pg — ABNORMAL LOW (ref 26.0–34.0)
MCHC: 31.4 g/dL (ref 30.0–36.0)
MCV: 77.4 fL — ABNORMAL LOW (ref 80.0–100.0)
Platelets: 194 10*3/uL (ref 150–400)
RBC: 4.52 MIL/uL (ref 4.22–5.81)
RDW: 13.9 % (ref 11.5–15.5)
WBC: 6.2 10*3/uL (ref 4.0–10.5)
nRBC: 0 % (ref 0.0–0.2)

## 2023-07-14 LAB — URINALYSIS, ROUTINE W REFLEX MICROSCOPIC
Bilirubin Urine: NEGATIVE
Glucose, UA: NEGATIVE mg/dL
Hgb urine dipstick: NEGATIVE
Ketones, ur: NEGATIVE mg/dL
Leukocytes,Ua: NEGATIVE
Nitrite: NEGATIVE
Protein, ur: NEGATIVE mg/dL
Specific Gravity, Urine: 1.02 (ref 1.005–1.030)
pH: 5 (ref 5.0–8.0)

## 2023-07-14 LAB — COMPREHENSIVE METABOLIC PANEL
ALT: 27 U/L (ref 0–44)
AST: 25 U/L (ref 15–41)
Albumin: 4.2 g/dL (ref 3.5–5.0)
Alkaline Phosphatase: 70 U/L (ref 38–126)
Anion gap: 9 (ref 5–15)
BUN: 12 mg/dL (ref 6–20)
CO2: 22 mmol/L (ref 22–32)
Calcium: 9.2 mg/dL (ref 8.9–10.3)
Chloride: 106 mmol/L (ref 98–111)
Creatinine, Ser: 1.14 mg/dL (ref 0.61–1.24)
GFR, Estimated: >60 mL/min (ref >60)
Glucose, Bld: 89 mg/dL (ref 70–99)
Potassium: 3.4 mmol/L — ABNORMAL LOW (ref 3.5–5.1)
Sodium: 137 mmol/L (ref 135–145)
Total Bilirubin: 0.6 mg/dL (ref <1.2)
Total Protein: 7.3 g/dL (ref 6.5–8.1)

## 2023-07-14 MED ORDER — MORPHINE SULFATE (PF) 4 MG/ML IV SOLN
4.0000 mg | Freq: Once | INTRAVENOUS | Status: AC
  Administered 2023-07-14: 4 mg via INTRAMUSCULAR
  Filled 2023-07-14: qty 1

## 2023-07-14 MED ORDER — ONDANSETRON 8 MG PO TBDP
8.0000 mg | ORAL_TABLET | Freq: Once | ORAL | Status: AC
  Administered 2023-07-14: 8 mg via ORAL
  Filled 2023-07-14: qty 1

## 2023-07-14 MED ORDER — DICYCLOMINE HCL 10 MG/ML IM SOLN
20.0000 mg | Freq: Once | INTRAMUSCULAR | Status: AC
  Administered 2023-07-14: 20 mg via INTRAMUSCULAR
  Filled 2023-07-14: qty 2

## 2023-07-14 MED ORDER — LIDOCAINE HCL URETHRAL/MUCOSAL 2 % EX GEL
1.0000 | Freq: Once | CUTANEOUS | Status: AC
  Administered 2023-07-14: 1 via URETHRAL
  Filled 2023-07-14: qty 11

## 2023-07-14 MED ORDER — ONDANSETRON HCL 4 MG/2ML IJ SOLN
4.0000 mg | Freq: Once | INTRAMUSCULAR | Status: DC
Start: 2023-07-14 — End: 2023-07-14
  Filled 2023-07-14: qty 2

## 2023-07-14 MED ORDER — MORPHINE SULFATE (PF) 4 MG/ML IV SOLN
4.0000 mg | Freq: Once | INTRAVENOUS | Status: DC
  Filled 2023-07-14: qty 1

## 2023-07-14 MED ORDER — ONDANSETRON 4 MG PO TBDP
4.0000 mg | ORAL_TABLET | Freq: Once | ORAL | Status: AC
  Administered 2023-07-14: 4 mg via ORAL
  Filled 2023-07-14: qty 1

## 2023-07-14 MED ORDER — LORAZEPAM 0.5 MG PO TABS
0.5000 mg | ORAL_TABLET | Freq: Once | ORAL | Status: AC
  Administered 2023-07-14: 0.5 mg via ORAL
  Filled 2023-07-14: qty 1

## 2023-07-14 MED ORDER — DIAZEPAM 5 MG PO TABS
5.0000 mg | ORAL_TABLET | Freq: Once | ORAL | Status: AC
  Administered 2023-07-14: 5 mg via ORAL
  Filled 2023-07-14: qty 1

## 2023-07-14 NOTE — ED Triage Notes (Signed)
Pt reports generalized severe abdominal pain. Pt reports issue has been going on for a month. Seen yesterday for the same. Pt reports it keeps getting worse. Current episode started this afternoon. Pt endorses associated N/V/D. Denies fevers.

## 2023-07-14 NOTE — ED Provider Notes (Signed)
[name] Provider Note   CSN: [id number] Arrival date & time: 07/13/23  2321     History  Chief Complaint  Patient presents with   Abdominal Pain    Vincent Frank is a 22 y.o. male who presents to the ED for evaluation of abdominal pain, inability to urinate.  Patient states that a few weeks ago he was in [place] where he had a fall from 40 feet.  States that at that time he will to the hospital had trauma scans of his spine which were unremarkable.  Reports that ever since this time he has been unable to urinate on his own and has been self cathing at home.  States that the self-catheterization is very uncomfortable, painful.  He reports that he has supplies to self cath however today has only self cathed this morning.  Has not done so since 9 AM this morning, currently 5:04 AM.  Patient reports he was seen twice [facility] earlier today and discharged both times.  He is here with increased abdominal pain, nausea.  Denies any pain in his back, fevers.  States that the self-catheterization is very painful and he is unable to do so on his own.  Reports he has urology follow-up in the next few weeks.   Abdominal Pain Associated symptoms: nausea   Associated symptoms: no fever        Home Medications Prior to Admission medications   Medication Sig Start Date End Date Taking? Authorizing Provider  acetaminophen (TYLENOL) 500 MG tablet Take 1 tablet (500 mg total) by mouth every 6 (six) hours as needed. 07/12/23   Derwood Kaplan, MD  ARIPiprazole (ABILIFY) 5 MG tablet Take 1 tablet (5 mg total) by mouth at bedtime for 14 days. 06/05/23 07/13/23  Massengill, [name], MD  ciprofloxacin (CIPRO) 500 MG tablet Take 500 mg by mouth 2 (two) times daily. 07/05/23   [provider]  esomeprazole (NEXIUM) 40 MG capsule Take 1 capsule (40 mg total) by mouth daily. Patient not taking: Reported on 07/13/2023 06/09/23   Charlynne Pander, MD  FLUoxetine (PROZAC) 20 MG capsule Take 3 capsules (60 mg total) by mouth daily for 14 days. 06/06/23 07/13/23  Massengill, [name], MD  folic acid (FOLVITE) 1 MG tablet Take 1 mg by mouth daily. 07/06/23   [provider]  hydrOXYzine (ATARAX) 25 MG tablet Take 1 tablet (25 mg total) by mouth 3 (three) times daily as needed for anxiety. 06/05/23   Massengill, [name], MD  lamoTRIgine (LAMICTAL) 25 MG tablet Take 1 tablet (25 mg total) by mouth daily for 14 days. 06/06/23 07/13/23  Massengill, [name], MD  levETIRAcetam (KEPPRA) 500 MG tablet Take 500 mg by mouth 2 (two) times daily.    [provider]  naproxen (NAPROSYN) 500 MG tablet Take 1 tablet (500 mg total) by mouth 2 (two) times daily. 07/12/23   Derwood Kaplan, MD  naproxen sodium (ALEVE) 220 MG tablet Take 220-440 mg by mouth daily as needed (Pain).    [provider]  oxybutynin (DITROPAN) 5 MG tablet Take 1 tablet (5 mg total) by mouth 3 (three) times daily. 07/12/23   Derwood Kaplan, MD  oxyCODONE-acetaminophen (PERCOCET/ROXICET) 5-325 MG tablet Take 1 tablet by mouth every 8 (eight) hours as needed for severe pain (pain score 7-10). 07/12/23   Derwood Kaplan, MD  traZODone (DESYREL) 50 MG tablet Take 1 tablet (50 mg total) by mouth at bedtime. Patient taking differently: Take 50 mg  by mouth at bedtime as needed for sleep. 06/05/23   Massengill, [name], MD      Allergies    Bee venom, Haldol [haloperidol], Hornet venom, and Gabapentin    Review of Systems   Review of Systems  Constitutional:  Negative for fever.  Gastrointestinal:  Positive for abdominal pain and nausea.  Genitourinary:  Positive for difficulty urinating.  All other systems reviewed and are negative.   Physical Exam Updated Vital Signs BP (!) 132/59 (BP Location: Right Arm)   Pulse 75   Temp 98 F (36.7 C) (Oral)   Resp 16   Ht 6\' 3"  (1.905 m)   Wt 106.6 kg   SpO2 99%   BMI 29.37 kg/m  Physical Exam Vitals and  nursing note reviewed.  Constitutional:      General: He is not in acute distress.    Appearance: Normal appearance. He is not ill-appearing, toxic-appearing or diaphoretic.  HENT:     Head: Normocephalic and atraumatic.     Nose: Nose normal.     Mouth/Throat:     Mouth: Mucous membranes are moist.     Pharynx: Oropharynx is clear.  Eyes:     Extraocular Movements: Extraocular movements intact.     Conjunctiva/sclera: Conjunctivae normal.     Pupils: Pupils are equal, round, and reactive to light.  Cardiovascular:     Rate and Rhythm: Normal rate and regular rhythm.  Pulmonary:     Effort: Pulmonary effort is normal.     Breath sounds: Normal breath sounds. No wheezing.  Abdominal:     General: Abdomen is flat. Bowel sounds are normal.     Palpations: Abdomen is soft.     Tenderness: There is abdominal tenderness.     Comments: Lower abdominal/suprapubic tenderness on exam  Musculoskeletal:     Cervical back: Normal range of motion and neck supple. No tenderness.  Skin:    General: Skin is warm and dry.     Capillary Refill: Capillary refill takes less than 2 seconds.  Neurological:     Mental Status: He is alert and oriented to person, place, and time.     ED Results / Procedures / Treatments   Labs (all labs ordered are listed, but only abnormal results are displayed) Labs Reviewed  CBC - Abnormal; Notable for the following components:      Result Value   Hemoglobin 11.0 (*)    HCT 35.0 (*)    MCV 77.4 (*)    MCH 24.3 (*)    All other components within normal limits  COMPREHENSIVE METABOLIC PANEL - Abnormal; Notable for the following components:   Potassium 3.4 (*)    All other components within normal limits  URINALYSIS, ROUTINE W REFLEX MICROSCOPIC    EKG EKG Interpretation Date/Time:  Wednesday July 14 2023 06:31:03 EST Ventricular Rate:  66 PR Interval:  186 QRS Duration:  117 QT Interval:  420 QTC Calculation: 440 R Axis:   57  Text  Interpretation: Sinus rhythm Incomplete right bundle branch block Confirmed by Tilden Fossa [phone]) on 07/14/2023 6:34:53 AM  Radiology No results found.  Procedures Procedures   Medications Ordered in ED Medications  LORazepam (ATIVAN) tablet 0.5 mg (0.5 mg Oral Given 07/14/23 0535)  lidocaine (XYLOCAINE) 2 % jelly 1 Application (1 Application Urethral Given 07/14/23 0535)  ondansetron (ZOFRAN-ODT) disintegrating tablet 8 mg (8 mg Oral Given 07/14/23 0536)  morphine (PF) 4 MG/ML injection 4 mg (4 mg Intramuscular Given 07/14/23 0535)  diazepam (VALIUM) tablet  5 mg (5 mg Oral Given 07/14/23 0628)  dicyclomine (BENTYL) injection 20 mg (20 mg Intramuscular Given 07/14/23 2952)    ED Course/ Medical Decision Making/ A&P  Medical Decision Making Amount and/or Complexity of Data Reviewed Labs: ordered.  Risk Prescription drug management.   22 year old male presents to ED for evaluation.  Please see HPI for further details.  On my examination the patient is afebrile and nontachycardic.  His lung sounds are clear bilaterally, he is not hypoxic.  He has suprapubic tenderness on exam.  Neurological examination at baseline.  Patient reports he has not self cathed since 9 AM this morning.  Reports that he has self cath ever since a fall in [place] about 1 month ago.  Chart reviewed, appears that the patient had an accident while hiking.  Patient gave inconsistent stories at this time to ED staff in [place].  He has been seen twice today at [facility] for his symptoms.  He does appear very uncomfortable in examination room.  Will collect labs, bladder scan, provide pain medication and In-N-Out cath.  I advised the patient that Foley catheter was most likely going to be his best long-term treatment option until seen by urology however he defers this.  Patient CBC without leukocytosis or anemia.  Urinalysis unremarkable.  CMP shows K 3.4.  The patient was given 8 mg of Zofran,  0.5 mg of Ativan, 4 mg of IM morphine.  Also utilized Uro-Jet lidocaine jelly prior to catheterization.  On reassessment, after the patient had been cathed, 600 ml of urine relieved, he continues to complain of excruciating abdominal pain.  Will provide patient Bentyl and Valium and reassess.  On reassessment, patient abdominal pain has decreased.  Will discharge home at this time.  Will have him follow-up with urology as previously scheduled.  Encouraged warm compresses, Tylenol at home for pain.   Final Clinical Impression(s) / ED Diagnoses Final diagnoses:  Abdominal pain, unspecified abdominal location  Urinary retention    Rx / DC Orders ED Discharge Orders     None         Al Decant, PA-C 07/14/23 8413    Tilden Fossa, MD 07/14/23 [phone]

## 2023-07-14 NOTE — Discharge Instructions (Signed)
Please follow-up with your urologist as previously scheduled.  Please continue to catheterize yourself as needed at home.  Take Tylenol for pain and warm compresses as well.  Please return for any new or worsening symptoms.

## 2023-07-15 ENCOUNTER — Encounter (HOSPITAL_COMMUNITY): Payer: Self-pay

## 2023-07-15 ENCOUNTER — Emergency Department (HOSPITAL_BASED_OUTPATIENT_CLINIC_OR_DEPARTMENT_OTHER)
Admission: EM | Admit: 2023-07-15 | Discharge: 2023-07-15 | Disposition: A | Discharge status: Home / Self Care | Payer: BC Managed Care – PPO | Attending: Emergency Medicine | Admitting: Emergency Medicine

## 2023-07-15 ENCOUNTER — Emergency Department (HOSPITAL_COMMUNITY)
Admission: EM | Admit: 2023-07-15 | Discharge: 2023-07-15 | Disposition: A | Discharge status: Home / Self Care | Payer: BC Managed Care – PPO | Source: Home / Self Care | Attending: Emergency Medicine | Admitting: Emergency Medicine

## 2023-07-15 DIAGNOSIS — R1084 Generalized abdominal pain: Secondary | ICD-10-CM

## 2023-07-15 DIAGNOSIS — Z91010 Allergy to peanuts: Secondary | ICD-10-CM | POA: Insufficient documentation

## 2023-07-15 DIAGNOSIS — R569 Unspecified convulsions: Secondary | ICD-10-CM | POA: Insufficient documentation

## 2023-07-15 LAB — COMPREHENSIVE METABOLIC PANEL
ALT: 26 U/L (ref 0–44)
AST: 30 U/L (ref 15–41)
Albumin: 4.2 g/dL (ref 3.5–5.0)
Alkaline Phosphatase: 68 U/L (ref 38–126)
Anion gap: 8 (ref 5–15)
BUN: 14 mg/dL (ref 6–20)
CO2: 20 mmol/L — ABNORMAL LOW (ref 22–32)
Calcium: 9.4 mg/dL (ref 8.9–10.3)
Chloride: 109 mmol/L (ref 98–111)
Creatinine, Ser: 1.16 mg/dL (ref 0.61–1.24)
GFR, Estimated: >60 mL/min (ref >60)
Glucose, Bld: 107 mg/dL — ABNORMAL HIGH (ref 70–99)
Potassium: 3.6 mmol/L (ref 3.5–5.1)
Sodium: 137 mmol/L (ref 135–145)
Total Bilirubin: 0.5 mg/dL (ref <1.2)
Total Protein: 7.1 g/dL (ref 6.5–8.1)

## 2023-07-15 LAB — URINALYSIS, ROUTINE W REFLEX MICROSCOPIC
Bilirubin Urine: NEGATIVE
Glucose, UA: NEGATIVE mg/dL
Hgb urine dipstick: NEGATIVE
Ketones, ur: NEGATIVE mg/dL
Leukocytes,Ua: NEGATIVE
Nitrite: NEGATIVE
Protein, ur: NEGATIVE mg/dL
Specific Gravity, Urine: 1.024 (ref 1.005–1.030)
pH: 5.5 (ref 5.0–8.0)

## 2023-07-15 LAB — CBC WITH DIFFERENTIAL/PLATELET
Abs Immature Granulocytes: 0.03 10*3/uL (ref 0.00–0.07)
Basophils Absolute: 0 10*3/uL (ref 0.0–0.1)
Basophils Relative: 0 %
Eosinophils Absolute: 0.1 10*3/uL (ref 0.0–0.5)
Eosinophils Relative: 1 %
HCT: 34.4 % — ABNORMAL LOW (ref 39.0–52.0)
Hemoglobin: 11.2 g/dL — ABNORMAL LOW (ref 13.0–17.0)
Immature Granulocytes: 0 %
Lymphocytes Relative: 24 %
Lymphs Abs: 1.9 10*3/uL (ref 0.7–4.0)
MCH: 24.6 pg — ABNORMAL LOW (ref 26.0–34.0)
MCHC: 32.6 g/dL (ref 30.0–36.0)
MCV: 75.4 fL — ABNORMAL LOW (ref 80.0–100.0)
Monocytes Absolute: 0.6 10*3/uL (ref 0.1–1.0)
Monocytes Relative: 7 %
Neutro Abs: 5.4 10*3/uL (ref 1.7–7.7)
Neutrophils Relative %: 68 %
Platelets: 199 10*3/uL (ref 150–400)
RBC: 4.56 MIL/uL (ref 4.22–5.81)
RDW: 14 % (ref 11.5–15.5)
WBC: 8.1 10*3/uL (ref 4.0–10.5)
nRBC: 0 % (ref 0.0–0.2)

## 2023-07-15 LAB — RESP PANEL BY RT-PCR (RSV, FLU A&B, COVID)  RVPGX2
Influenza A by PCR: NEGATIVE
Influenza B by PCR: NEGATIVE
Resp Syncytial Virus by PCR: NEGATIVE
SARS Coronavirus 2 by RT PCR: NEGATIVE

## 2023-07-15 LAB — CBG MONITORING, ED: Glucose-Capillary: 88 mg/dL (ref 70–99)

## 2023-07-15 LAB — URINE CULTURE: Culture: 30000 — AB

## 2023-07-15 MED ORDER — ACETAMINOPHEN 500 MG PO TABS
1000.0000 mg | ORAL_TABLET | Freq: Once | ORAL | Status: AC
  Administered 2023-07-15: 1000 mg via ORAL
  Filled 2023-07-15: qty 2

## 2023-07-15 MED ORDER — DICYCLOMINE HCL 10 MG PO CAPS
10.0000 mg | ORAL_CAPSULE | Freq: Once | ORAL | Status: AC
  Administered 2023-07-15: 10 mg via ORAL
  Filled 2023-07-15: qty 1

## 2023-07-15 MED ORDER — LIDOCAINE HCL URETHRAL/MUCOSAL 2 % EX GEL
1.0000 | Freq: Once | CUTANEOUS | Status: AC
  Administered 2023-07-15: 1 via URETHRAL
  Filled 2023-07-15: qty 11

## 2023-07-15 MED ORDER — LORAZEPAM 1 MG PO TABS
2.0000 mg | ORAL_TABLET | Freq: Once | ORAL | Status: AC
  Administered 2023-07-15: 2 mg via ORAL
  Filled 2023-07-15: qty 2

## 2023-07-15 NOTE — ED Triage Notes (Signed)
Pt BIB EMS from [facility] due to a unwitnessed seizure. Pt reported to staff at [facility] stating he thinks he had a seizure. EMS was called. In route EMS reports pt has another seizure that lasted for 45 seconds, HR spiked and BP dropped, 20 ga IV placed in L hand, 2.5 mg of Midazolam was given. Hx of seizures. Pt is Alert and Oriented slow to speech and move, lethargic. Pt c/o 10/10 abdominal pain.

## 2023-07-15 NOTE — ED Notes (Signed)
Pt. States he has been self cathing at home and is unable to void.

## 2023-07-15 NOTE — ED Notes (Signed)
IV removed by pt bleeding controlled, dressing place
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
## 2023-07-15 NOTE — Discharge Instructions (Addendum)
We evaluated you for your shaking activity.  We do not think your shaking was caused by an epileptic seizure.  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  This is not a dangerous process but it can be frightening.  Please follow-up with your primary doctor.  Please return if you have any new or worsening symptoms such as vomiting, fevers, severe pain, severe headaches, or any other concerning symptoms.

## 2023-07-15 NOTE — Discharge Instructions (Signed)
You were seen today again for abdominal pain.  Your workup is reassuring including urinalysis.  Continue self-catheterization at home and follow-up with urology as planned.  Take the oxybutynin that was previously prescribed as this may be bladder spasm.

## 2023-07-15 NOTE — ED Notes (Signed)
Patient d/c with home care instructions with mom and dad at bedside. Patient provided with a wheelchair per request.
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
## 2023-07-15 NOTE — ED Notes (Signed)
1701: Pt report he feel like he going to have another seizure. 1703: Pt begin shaking; what appears to be possible seizure activity. Pt does not respond to verbal commands. Will respond to pain stimuli 1704: Pt stops shaking. Pt is resting. Pt does not respond to verbal commands. Will respond to pain stimuli

## 2023-07-16 ENCOUNTER — Telehealth (HOSPITAL_BASED_OUTPATIENT_CLINIC_OR_DEPARTMENT_OTHER): Payer: Self-pay

## 2023-07-16 ENCOUNTER — Emergency Department (HOSPITAL_COMMUNITY)
Admission: EM | Admit: 2023-07-16 | Discharge: 2023-07-17 | Disposition: A | Discharge status: Home / Self Care | Payer: BC Managed Care – PPO | Attending: Emergency Medicine | Admitting: Emergency Medicine

## 2023-07-16 DIAGNOSIS — T5492XA Toxic effect of unspecified corrosive substance, intentional self-harm, initial encounter: Secondary | ICD-10-CM | POA: Insufficient documentation

## 2023-07-16 DIAGNOSIS — M549 Dorsalgia, unspecified: Secondary | ICD-10-CM | POA: Diagnosis not present

## 2023-07-16 DIAGNOSIS — F4325 Adjustment disorder with mixed disturbance of emotions and conduct: Secondary | ICD-10-CM | POA: Diagnosis not present

## 2023-07-16 DIAGNOSIS — F339 Major depressive disorder, recurrent, unspecified: Secondary | ICD-10-CM | POA: Diagnosis present

## 2023-07-16 DIAGNOSIS — F332 Major depressive disorder, recurrent severe without psychotic features: Secondary | ICD-10-CM | POA: Diagnosis not present

## 2023-07-16 NOTE — Telephone Encounter (Signed)
Post ED Visit - Positive Culture Follow-up  Culture report reviewed by antimicrobial stewardship pharmacist: [facility] [x]  Daylene Posey, Pharm.D. []  Celedonio Miyamoto, Pharm.D., BCPS AQ-ID []  Garvin Fila, Pharm.D., BCPS []  Georgina Pillion, Pharm.D., BCPS []  [place], [address].D., BCPS, AAHIVP []  Estella Husk, Pharm.D., BCPS, AAHIVP []  Lysle Pearl, PharmD, BCPS []  Phillips Climes, PharmD, BCPS []  Agapito Games, PharmD, BCPS []  Verlan Friends, PharmD []  Mervyn Gay, PharmD, BCPS []  Vinnie Level, PharmD  [facility] []  Len Childs, PharmD []  Greer Pickerel, PharmD []  Adalberto Cole, PharmD []  Perlie Gold, Rph []  Lonell Face) Jean Rosenthal, PharmD []  Earl Many, PharmD []  Junita Push, PharmD []  Dorna Leitz, PharmD []  Terrilee Files, PharmD []  Lynann Beaver, PharmD []  Keturah Barre, PharmD []  Loralee Pacas, PharmD []  Bernadene Person, PharmD   Positive urine culture Not treated, no bacteria no treatment needed and no further patient follow-up is required at this time.  Sandria Senter 07/16/2023, 9:41 AM

## 2023-07-17 ENCOUNTER — Other Ambulatory Visit: Payer: Self-pay

## 2023-07-17 ENCOUNTER — Emergency Department (HOSPITAL_COMMUNITY): Payer: BC Managed Care – PPO

## 2023-07-17 ENCOUNTER — Inpatient Hospital Stay (HOSPITAL_COMMUNITY)
Admission: EM | Admit: 2023-07-17 | Discharge: 2023-08-04 | DRG: 885 | Disposition: A | Discharge status: Home / Self Care | Payer: BC Managed Care – PPO | Attending: General Surgery | Admitting: General Surgery

## 2023-07-17 ENCOUNTER — Encounter (HOSPITAL_COMMUNITY): Payer: Self-pay

## 2023-07-17 DIAGNOSIS — F909 Attention-deficit hyperactivity disorder, unspecified type: Secondary | ICD-10-CM | POA: Diagnosis present

## 2023-07-17 DIAGNOSIS — M549 Dorsalgia, unspecified: Secondary | ICD-10-CM | POA: Diagnosis present

## 2023-07-17 DIAGNOSIS — W1830XA Fall on same level, unspecified, initial encounter: Secondary | ICD-10-CM | POA: Diagnosis not present

## 2023-07-17 DIAGNOSIS — Z79899 Other long term (current) drug therapy: Secondary | ICD-10-CM

## 2023-07-17 DIAGNOSIS — Z91030 Bee allergy status: Secondary | ICD-10-CM | POA: Diagnosis not present

## 2023-07-17 DIAGNOSIS — F940 Selective mutism: Secondary | ICD-10-CM | POA: Diagnosis not present

## 2023-07-17 DIAGNOSIS — F4325 Adjustment disorder with mixed disturbance of emotions and conduct: Secondary | ICD-10-CM | POA: Diagnosis present

## 2023-07-17 DIAGNOSIS — N319 Neuromuscular dysfunction of bladder, unspecified: Secondary | ICD-10-CM | POA: Diagnosis not present

## 2023-07-17 DIAGNOSIS — R569 Unspecified convulsions: Secondary | ICD-10-CM | POA: Diagnosis not present

## 2023-07-17 DIAGNOSIS — N419 Inflammatory disease of prostate, unspecified: Secondary | ICD-10-CM | POA: Diagnosis present

## 2023-07-17 DIAGNOSIS — F332 Major depressive disorder, recurrent severe without psychotic features: Secondary | ICD-10-CM | POA: Diagnosis present

## 2023-07-17 DIAGNOSIS — Y92231 Patient bathroom in hospital as the place of occurrence of the external cause: Secondary | ICD-10-CM | POA: Diagnosis not present

## 2023-07-17 DIAGNOSIS — Z62810 Personal history of physical and sexual abuse in childhood: Secondary | ICD-10-CM | POA: Diagnosis not present

## 2023-07-17 DIAGNOSIS — F445 Conversion disorder with seizures or convulsions: Secondary | ICD-10-CM | POA: Diagnosis not present

## 2023-07-17 DIAGNOSIS — Y9289 Other specified places as the place of occurrence of the external cause: Secondary | ICD-10-CM

## 2023-07-17 DIAGNOSIS — F4310 Post-traumatic stress disorder, unspecified: Secondary | ICD-10-CM | POA: Diagnosis present

## 2023-07-17 DIAGNOSIS — T5492XA Toxic effect of unspecified corrosive substance, intentional self-harm, initial encounter: Secondary | ICD-10-CM | POA: Diagnosis not present

## 2023-07-17 DIAGNOSIS — Z91010 Allergy to peanuts: Secondary | ICD-10-CM | POA: Diagnosis not present

## 2023-07-17 DIAGNOSIS — F339 Major depressive disorder, recurrent, unspecified: Secondary | ICD-10-CM | POA: Diagnosis not present

## 2023-07-17 DIAGNOSIS — R339 Retention of urine, unspecified: Secondary | ICD-10-CM | POA: Diagnosis not present

## 2023-07-17 DIAGNOSIS — T1490XA Injury, unspecified, initial encounter: Secondary | ICD-10-CM | POA: Diagnosis not present

## 2023-07-17 DIAGNOSIS — J9601 Acute respiratory failure with hypoxia: Secondary | ICD-10-CM | POA: Diagnosis not present

## 2023-07-17 DIAGNOSIS — Z751 Person awaiting admission to adequate facility elsewhere: Secondary | ICD-10-CM | POA: Diagnosis not present

## 2023-07-17 DIAGNOSIS — F411 Generalized anxiety disorder: Secondary | ICD-10-CM | POA: Insufficient documentation

## 2023-07-17 DIAGNOSIS — Z792 Long term (current) use of antibiotics: Secondary | ICD-10-CM

## 2023-07-17 DIAGNOSIS — R401 Stupor: Secondary | ICD-10-CM

## 2023-07-17 DIAGNOSIS — Z608 Other problems related to social environment: Secondary | ICD-10-CM | POA: Diagnosis present

## 2023-07-17 DIAGNOSIS — X80XXXA Intentional self-harm by jumping from a high place, initial encounter: Secondary | ICD-10-CM | POA: Diagnosis present

## 2023-07-17 DIAGNOSIS — Z9151 Personal history of suicidal behavior: Secondary | ICD-10-CM | POA: Diagnosis not present

## 2023-07-17 DIAGNOSIS — Z91038 Other insect allergy status: Secondary | ICD-10-CM

## 2023-07-17 DIAGNOSIS — F603 Borderline personality disorder: Secondary | ICD-10-CM | POA: Diagnosis present

## 2023-07-17 DIAGNOSIS — W19XXXA Unspecified fall, initial encounter: Principal | ICD-10-CM

## 2023-07-17 DIAGNOSIS — S0093XA Contusion of unspecified part of head, initial encounter: Secondary | ICD-10-CM | POA: Diagnosis not present

## 2023-07-17 LAB — CBC
HCT: 35.1 % — ABNORMAL LOW (ref 39.0–52.0)
HCT: 35.5 % — ABNORMAL LOW (ref 39.0–52.0)
Hemoglobin: 11.1 g/dL — ABNORMAL LOW (ref 13.0–17.0)
Hemoglobin: 10.9 g/dL — ABNORMAL LOW (ref 13.0–17.0)
MCH: 24.3 pg — ABNORMAL LOW (ref 26.0–34.0)
MCH: 24 pg — ABNORMAL LOW (ref 26.0–34.0)
MCHC: 31.6 g/dL (ref 30.0–36.0)
MCHC: 30.7 g/dL (ref 30.0–36.0)
MCV: 77 fL — ABNORMAL LOW (ref 80.0–100.0)
MCV: 78.2 fL — ABNORMAL LOW (ref 80.0–100.0)
Platelets: 234 10*3/uL (ref 150–400)
Platelets: 212 10*3/uL (ref 150–400)
RBC: 4.56 MIL/uL (ref 4.22–5.81)
RBC: 4.54 MIL/uL (ref 4.22–5.81)
RDW: 14.1 % (ref 11.5–15.5)
RDW: 14.2 % (ref 11.5–15.5)
WBC: 6.9 10*3/uL (ref 4.0–10.5)
WBC: 5.9 10*3/uL (ref 4.0–10.5)
nRBC: 0 % (ref 0.0–0.2)
nRBC: 0 % (ref 0.0–0.2)

## 2023-07-17 LAB — SALICYLATE LEVEL
Salicylate Lvl: <7 mg/dL — ABNORMAL LOW (ref 7.0–30.0)
Salicylate Lvl: <7 mg/dL — ABNORMAL LOW (ref 7.0–30.0)

## 2023-07-17 LAB — RAPID URINE DRUG SCREEN, HOSP PERFORMED
Amphetamines: NOT DETECTED
Barbiturates: NOT DETECTED
Benzodiazepines: POSITIVE — AB
Cocaine: NOT DETECTED
Opiates: NOT DETECTED
Tetrahydrocannabinol: NOT DETECTED

## 2023-07-17 LAB — ACETAMINOPHEN LEVEL
Acetaminophen (Tylenol), Serum: <10 ug/mL — ABNORMAL LOW (ref 10–30)
Acetaminophen (Tylenol), Serum: <10 ug/mL — ABNORMAL LOW (ref 10–30)
Acetaminophen (Tylenol), Serum: <10 ug/mL — ABNORMAL LOW (ref 10–30)

## 2023-07-17 LAB — I-STAT ARTERIAL BLOOD GAS, ED
Acid-base deficit: 3 mmol/L — ABNORMAL HIGH (ref 0.0–2.0)
Bicarbonate: 20.6 mmol/L (ref 20.0–28.0)
Calcium, Ion: 1.27 mmol/L (ref 1.15–1.40)
HCT: 33 % — ABNORMAL LOW (ref 39.0–52.0)
Hemoglobin: 11.2 g/dL — ABNORMAL LOW (ref 13.0–17.0)
O2 Saturation: 100 %
Patient temperature: 97.9
Potassium: 3.7 mmol/L (ref 3.5–5.1)
Sodium: 140 mmol/L (ref 135–145)
TCO2: 22 mmol/L (ref 22–32)
pCO2 arterial: 30.3 mmHg — ABNORMAL LOW (ref 32–48)
pH, Arterial: 7.44 (ref 7.35–7.45)
pO2, Arterial: 272 mmHg — ABNORMAL HIGH (ref 83–108)

## 2023-07-17 LAB — COMPREHENSIVE METABOLIC PANEL
ALT: 23 U/L (ref 0–44)
ALT: 19 U/L (ref 0–44)
AST: 27 U/L (ref 15–41)
AST: 21 U/L (ref 15–41)
Albumin: 3.9 g/dL (ref 3.5–5.0)
Albumin: 3.1 g/dL — ABNORMAL LOW (ref 3.5–5.0)
Alkaline Phosphatase: 71 U/L (ref 38–126)
Alkaline Phosphatase: 56 U/L (ref 38–126)
Anion gap: 9 (ref 5–15)
Anion gap: 7 (ref 5–15)
BUN: 11 mg/dL (ref 6–20)
BUN: 9 mg/dL (ref 6–20)
CO2: 22 mmol/L (ref 22–32)
CO2: 19 mmol/L — ABNORMAL LOW (ref 22–32)
Calcium: 9.3 mg/dL (ref 8.9–10.3)
Calcium: 7.4 mg/dL — ABNORMAL LOW (ref 8.9–10.3)
Chloride: 110 mmol/L (ref 98–111)
Chloride: 116 mmol/L — ABNORMAL HIGH (ref 98–111)
Creatinine, Ser: 1.15 mg/dL (ref 0.61–1.24)
Creatinine, Ser: 1.06 mg/dL (ref 0.61–1.24)
GFR, Estimated: >60 mL/min (ref >60)
GFR, Estimated: >60 mL/min (ref >60)
Glucose, Bld: 101 mg/dL — ABNORMAL HIGH (ref 70–99)
Glucose, Bld: 66 mg/dL — ABNORMAL LOW (ref 70–99)
Potassium: 4 mmol/L (ref 3.5–5.1)
Potassium: 3.2 mmol/L — ABNORMAL LOW (ref 3.5–5.1)
Sodium: 141 mmol/L (ref 135–145)
Sodium: 142 mmol/L (ref 135–145)
Total Bilirubin: 0.4 mg/dL (ref <1.2)
Total Bilirubin: 0.5 mg/dL (ref <1.2)
Total Protein: 6.7 g/dL (ref 6.5–8.1)
Total Protein: 5.2 g/dL — ABNORMAL LOW (ref 6.5–8.1)

## 2023-07-17 LAB — I-STAT CHEM 8, ED
BUN: 7 mg/dL (ref 6–20)
Calcium, Ion: 0.93 mmol/L — ABNORMAL LOW (ref 1.15–1.40)
Chloride: 117 mmol/L — ABNORMAL HIGH (ref 98–111)
Creatinine, Ser: 1.1 mg/dL (ref 0.61–1.24)
Glucose, Bld: 60 mg/dL — ABNORMAL LOW (ref 70–99)
HCT: 27 % — ABNORMAL LOW (ref 39.0–52.0)
Hemoglobin: 9.2 g/dL — ABNORMAL LOW (ref 13.0–17.0)
Potassium: 3.2 mmol/L — ABNORMAL LOW (ref 3.5–5.1)
Sodium: 147 mmol/L — ABNORMAL HIGH (ref 135–145)
TCO2: 17 mmol/L — ABNORMAL LOW (ref 22–32)

## 2023-07-17 LAB — TYPE AND SCREEN
ABO/RH(D): A POS
Antibody Screen: NEGATIVE

## 2023-07-17 LAB — URINALYSIS, ROUTINE W REFLEX MICROSCOPIC
Bilirubin Urine: NEGATIVE
Glucose, UA: NEGATIVE mg/dL
Hgb urine dipstick: NEGATIVE
Ketones, ur: NEGATIVE mg/dL
Leukocytes,Ua: NEGATIVE
Nitrite: NEGATIVE
Protein, ur: NEGATIVE mg/dL
Specific Gravity, Urine: >1.046 — ABNORMAL HIGH (ref 1.005–1.030)
pH: 7 (ref 5.0–8.0)

## 2023-07-17 LAB — HIV ANTIBODY (ROUTINE TESTING W REFLEX): HIV Screen 4th Generation wRfx: NONREACTIVE

## 2023-07-17 LAB — I-STAT CG4 LACTIC ACID, ED: Lactic Acid, Venous: 1.3 mmol/L (ref 0.5–1.9)

## 2023-07-17 LAB — ETHANOL
Alcohol, Ethyl (B): <10 mg/dL (ref <10)
Alcohol, Ethyl (B): <10 mg/dL (ref <10)

## 2023-07-17 LAB — PROTIME-INR
INR: 1.2 (ref 0.8–1.2)
Prothrombin Time: 15.8 s — ABNORMAL HIGH (ref 11.4–15.2)

## 2023-07-17 LAB — ABO/RH: ABO/RH(D): A POS

## 2023-07-17 LAB — CBG MONITORING, ED: Glucose-Capillary: 99 mg/dL (ref 70–99)

## 2023-07-17 MED ORDER — FENTANYL 2500MCG IN NS 250ML (10MCG/ML) PREMIX INFUSION
50.0000 ug/h | INTRAVENOUS | Status: DC
Start: 2023-07-17 — End: 2023-07-18
  Administered 2023-07-17: 100 ug/h via INTRAVENOUS
  Filled 2023-07-17: qty 250

## 2023-07-17 MED ORDER — ACETAMINOPHEN 500 MG PO TABS
1000.0000 mg | ORAL_TABLET | Freq: Four times a day (QID) | ORAL | Status: DC
Start: 2023-07-17 — End: 2023-07-20
  Administered 2023-07-17 – 2023-07-20 (×10): 1000 mg via ORAL
  Filled 2023-07-17 (×11): qty 2

## 2023-07-17 MED ORDER — LEVETIRACETAM IN NACL 500 MG/100ML IV SOLN
500.0000 mg | Freq: Two times a day (BID) | INTRAVENOUS | Status: DC
  Administered 2023-07-17 – 2023-07-18 (×2): 500 mg via INTRAVENOUS
  Filled 2023-07-17 (×2): qty 100

## 2023-07-17 MED ORDER — LAMOTRIGINE 25 MG PO TABS
50.0000 mg | ORAL_TABLET | Freq: Every day | ORAL | Status: DC
  Administered 2023-07-17 – 2023-07-19 (×3): 50 mg
  Filled 2023-07-17 (×4): qty 2

## 2023-07-17 MED ORDER — FLUMAZENIL 0.5 MG/5ML IV SOLN
0.5000 mg | Freq: Once | INTRAVENOUS | Status: AC
  Administered 2023-07-17: 0.5 mg via INTRAVENOUS

## 2023-07-17 MED ORDER — IOHEXOL 350 MG/ML SOLN
75.0000 mL | Freq: Once | INTRAVENOUS | Status: AC | PRN
  Administered 2023-07-17: 75 mL via INTRAVENOUS

## 2023-07-17 MED ORDER — FENTANYL BOLUS VIA INFUSION: 50.0000 ug | INTRAVENOUS | Status: DC | PRN

## 2023-07-17 MED ORDER — POLYETHYLENE GLYCOL 3350 17 G PO PACK: 17.0000 g | PACK | Freq: Every day | ORAL | Status: DC

## 2023-07-17 MED ORDER — HYDRALAZINE HCL 20 MG/ML IJ SOLN: 10.0000 mg | INTRAMUSCULAR | Status: DC | PRN

## 2023-07-17 MED ORDER — ENOXAPARIN SODIUM 30 MG/0.3ML IJ SOSY
30.0000 mg | PREFILLED_SYRINGE | Freq: Two times a day (BID) | INTRAMUSCULAR | Status: DC
  Administered 2023-07-18 – 2023-08-04 (×30): 30 mg via SUBCUTANEOUS
  Filled 2023-07-17 (×32): qty 0.3

## 2023-07-17 MED ORDER — METHOCARBAMOL 500 MG PO TABS
500.0000 mg | ORAL_TABLET | Freq: Three times a day (TID) | ORAL | Status: AC
  Administered 2023-07-17 – 2023-07-20 (×8): 500 mg via ORAL
  Filled 2023-07-17 (×8): qty 1

## 2023-07-17 MED ORDER — SUCCINYLCHOLINE CHLORIDE 20 MG/ML IJ SOLN
INTRAMUSCULAR | Status: AC | PRN
  Administered 2023-07-17: 160 mg via INTRAVENOUS

## 2023-07-17 MED ORDER — ARIPIPRAZOLE 5 MG PO TABS
5.0000 mg | ORAL_TABLET | Freq: Every day | ORAL | Status: DC
Start: 2023-07-18 — End: 2023-07-22
  Administered 2023-07-18 – 2023-07-22 (×5): 5 mg
  Filled 2023-07-17 (×5): qty 1

## 2023-07-17 MED ORDER — FLUMAZENIL 0.5 MG/5ML IV SOLN
INTRAVENOUS | Status: AC
  Filled 2023-07-17: qty 5

## 2023-07-17 MED ORDER — PROPOFOL 1000 MG/100ML IV EMUL: 0.0000 ug/kg/min | INTRAVENOUS | Status: DC

## 2023-07-17 MED ORDER — ONDANSETRON 4 MG PO TBDP
4.0000 mg | ORAL_TABLET | Freq: Four times a day (QID) | ORAL | Status: DC | PRN
  Administered 2023-07-31: 4 mg via ORAL
  Filled 2023-07-17: qty 1

## 2023-07-17 MED ORDER — PROPOFOL 1000 MG/100ML IV EMUL
0.0000 ug/kg/min | INTRAVENOUS | Status: DC
  Administered 2023-07-17: 30 ug/kg/min via INTRAVENOUS
  Administered 2023-07-17: 50 ug/kg/min via INTRAVENOUS
  Filled 2023-07-17: qty 100

## 2023-07-17 MED ORDER — QUETIAPINE FUMARATE 50 MG PO TABS
50.0000 mg | ORAL_TABLET | Freq: Two times a day (BID) | ORAL | Status: DC
Start: 2023-07-17 — End: 2023-07-19
  Administered 2023-07-17 – 2023-07-19 (×4): 50 mg
  Filled 2023-07-17 (×3): qty 1
  Filled 2023-07-17: qty 2

## 2023-07-17 MED ORDER — ZIPRASIDONE MESYLATE 20 MG IM SOLR
20.0000 mg | Freq: Once | INTRAMUSCULAR | Status: AC
  Administered 2023-07-17: 20 mg via INTRAMUSCULAR
  Filled 2023-07-17: qty 20

## 2023-07-17 MED ORDER — DOCUSATE SODIUM 50 MG/5ML PO LIQD: 100.0000 mg | Freq: Two times a day (BID) | ORAL | Status: DC

## 2023-07-17 MED ORDER — ARIPIPRAZOLE 2 MG PO TABS
2.0000 mg | ORAL_TABLET | Freq: Every day | ORAL | Status: DC
  Filled 2023-07-17: qty 1

## 2023-07-17 MED ORDER — DOCUSATE SODIUM 50 MG/5ML PO LIQD
100.0000 mg | Freq: Two times a day (BID) | ORAL | Status: DC
  Filled 2023-07-17: qty 10

## 2023-07-17 MED ORDER — FENTANYL CITRATE PF 50 MCG/ML IJ SOSY
50.0000 ug | PREFILLED_SYRINGE | Freq: Once | INTRAMUSCULAR | Status: AC
  Administered 2023-07-17: 50 ug via INTRAVENOUS

## 2023-07-17 MED ORDER — ONDANSETRON HCL 4 MG/2ML IJ SOLN
4.0000 mg | Freq: Four times a day (QID) | INTRAMUSCULAR | Status: DC | PRN
  Administered 2023-07-22 – 2023-08-02 (×5): 4 mg via INTRAVENOUS
  Filled 2023-07-17 (×5): qty 2

## 2023-07-17 MED ORDER — POLYETHYLENE GLYCOL 3350 17 G PO PACK: 17.0000 g | PACK | Freq: Every day | ORAL | Status: DC | PRN

## 2023-07-17 MED ORDER — METHOCARBAMOL 1000 MG/10ML IJ SOLN
500.0000 mg | Freq: Three times a day (TID) | INTRAMUSCULAR | Status: AC
  Administered 2023-07-17: 500 mg via INTRAVENOUS
  Filled 2023-07-17 (×3): qty 10

## 2023-07-17 MED ORDER — PROPOFOL 1000 MG/100ML IV EMUL
0.0000 ug/kg/min | INTRAVENOUS | Status: DC
  Filled 2023-07-17: qty 100

## 2023-07-17 MED ORDER — ETOMIDATE 2 MG/ML IV SOLN
INTRAVENOUS | Status: AC | PRN
  Administered 2023-07-17: 20 mg via INTRAVENOUS

## 2023-07-17 MED ORDER — METOPROLOL TARTRATE 5 MG/5ML IV SOLN: 5.0000 mg | Freq: Four times a day (QID) | INTRAVENOUS | Status: DC | PRN

## 2023-07-17 MED ORDER — DEXTROSE-SODIUM CHLORIDE 5-0.9 % IV SOLN: INTRAVENOUS | Status: DC

## 2023-07-17 MED ORDER — FENTANYL 2500MCG IN NS 250ML (10MCG/ML) PREMIX INFUSION: 50.0000 ug/h | INTRAVENOUS | Status: DC

## 2023-07-17 MED ORDER — FLUOXETINE HCL 20 MG PO CAPS
40.0000 mg | ORAL_CAPSULE | Freq: Every day | ORAL | Status: DC
  Administered 2023-07-17: 40 mg via ORAL
  Filled 2023-07-17 (×2): qty 2

## 2023-07-17 MED ORDER — STERILE WATER FOR INJECTION IJ SOLN
INTRAMUSCULAR | Status: AC
  Administered 2023-07-17: 10 mL
  Filled 2023-07-17: qty 10

## 2023-07-17 MED ORDER — ZIPRASIDONE MESYLATE 20 MG IM SOLR: 20.0000 mg | Freq: Four times a day (QID) | INTRAMUSCULAR | Status: DC | PRN
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
## 2023-07-17 NOTE — Progress Notes (Signed)
No traumatic injuries seen. Moves all extremities.  I spoke with his mother who said he has recently been admitted in [facility].  His psychiatrist is in the [facility] system.  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  Per his mother, he takes fluoxetine, ability, and Keppra.  Admit to ICU for intubation Critical Care 35 min

## 2023-07-17 NOTE — Progress Notes (Signed)
Patient weaned off sedation. Slow to wake up and extubated successfully

## 2023-07-17 NOTE — ED Notes (Signed)
C-collar replaced to [place] J while holding c-spine in place

## 2023-07-17 NOTE — Progress Notes (Signed)
Transition of Care [facility]) - CAGE-AID Screening   Patient Details  Name: Vincent Frank MRN: [id number] Date of Birth: 08/03/2000  Transition of Care (TOC) CM/SW Contact:    Katha Hamming, RN Phone Number: 07/17/2023, 9:34 PM   CAGE-AID Screening:    Have You Ever Felt You Ought to Cut Down on Your Drinking or Drug Use?: No Have People Annoyed You By Critizing Your Drinking Or Drug Use?: No Have You Felt Bad Or Guilty About Your Drinking Or Drug Use?: No Have You Ever Had a Drink or Used Drugs First Thing In The Morning to Steady Your Nerves or to Get Rid of a Hangover?: No CAGE-AID Score: 0  Substance Abuse Education Offered: No (not indicated, no substance abuse hx)

## 2023-07-17 NOTE — ED Notes (Signed)
Patient transported to CT 

## 2023-07-17 NOTE — ED Notes (Addendum)
Attempted extubation. All sedation medication shut off at this time for extubation.

## 2023-07-17 NOTE — ED Notes (Signed)
Dr. Kingsinger at bedside. 

## 2023-07-17 NOTE — ED Notes (Signed)
Pt intubated, EDP, TRAUMA ED, TRN, and primary RN at bedside

## 2023-07-17 NOTE — Procedures (Signed)
Extubation Procedure Note  Patient Details:   Name: Vincent Frank DOB: [birth year] MRN: [id number]   Airway Documentation:  Airway 8 mm (Active)  Secured at (cm) 28 cm 07/17/23 1548  Measured From Lips 07/17/23 1548  Secured Location Right 07/17/23 1548  Secured By [employer] 07/17/23 1548  Cuff Pressure (cm H2O) Green OR 18-26 [facility] 07/17/23 1548  Site Condition Dry 07/17/23 1548   Vent end date: (not recorded) Vent end time: (not recorded)   Evaluation  O2 sats: stable throughout Complications: No apparent complications Patient did tolerate procedure well. Bilateral Breath Sounds: Clear   Yes Pt extubated to [facility] per order. Pt had a positive cuff leak and ability to speak afterwards. Trauma MD and trauma RN in room.  Gordy Savers 07/17/2023, 7:28 PM
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
## 2023-07-17 NOTE — ED Notes (Signed)
Attempted to call report to 5N, no one available.

## 2023-07-17 NOTE — Progress Notes (Signed)
Orthopedic Tech Progress Note Patient Details:  [name] Apr 04, 2001 [id number]  Patient ID: Vincent Frank, male   DOB: October 15, 2000, 22 y.o.   MRN: [id number] Level 1 trauma Tonye [name] 07/17/2023, 3:19 PM
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
## 2023-07-17 NOTE — Progress Notes (Signed)
Patient intubated by ED MD without complications.  Positive color change was noted.  Bilateral breath sounds auscultated.  Chest xray obtained for confirmation.  Once patient was intubated, and stabilized, patient was transported to CT and back to room Trauma C without complications.  Will continue to monitor.

## 2023-07-17 NOTE — ED Notes (Signed)
IVC paperwork complete and in blue zone, expires 07/24/23, case # [id number]

## 2023-07-17 NOTE — Progress Notes (Signed)
Post intubation ABG results obtained on ventilator settings of VT: 690, RR: 18, FIO2: 60%, and PEEP: 5.  Decreased FIO2 to 40%.  No further vent changes at this time.  Will continue to monitor.    Latest Reference Range & Units 07/17/23 16:52  Sample type  ARTERIAL  pH, Arterial 7.35 - 7.45  7.440  pCO2 arterial 32 - 48 mmHg 30.3 (L)  pO2, Arterial 83 - 108 mmHg 272 (H)  TCO2 22 - 32 mmol/L 22  Acid-base deficit 0.0 - 2.0 mmol/L 3.0 (H)  Bicarbonate 20.0 - 28.0 mmol/L 20.6  O2 Saturation % 100  Patient temperature  97.9 F  Collection site  RADIAL, ALLEN'S TEST ACCEPTABLE

## 2023-07-17 NOTE — ED Notes (Signed)
IVC CURRENT EXP 12/28 CASE# ADDED TO CHART DOCUMENTS ATTACHED TO CLIPBOARD IN ORANGE ZONE

## 2023-07-17 NOTE — ED Notes (Signed)
Security paged to dress patient out in purple scrubs per MD.

## 2023-07-17 NOTE — BH Assessment (Signed)
TTS attempted to complete assessment. Marchelle Folks, RN, patient is asleep, not oriented. TTS consult will be completed during next shift.

## 2023-07-17 NOTE — Discharge Instructions (Signed)
 Behavioral Health Resources:  Intensive Outpatient Programs: [facility]      [address] [place], [place] [phone] Both a day and evening program       [facility] Outpatient     [address]        [place], [place] [address] [phone]         ADS: [facility] [address] [place] [phone]  [facility] Mental Health ACCESS LINE: [phone] or [phone] [address] [place], [place] [address] [email or web address]   Substance Abuse Resources: Alcohol and Drug Services  [phone] [facility] [phone] [facility] [phone] Floydene Flock [phone] Residential & Outpatient Substance Abuse Program  [phone]  Psychological Services: [facility]  [phone] [facility]  [phone] [facility], [phone] [place]. [address], [place], ACCESS LINE: [phone] or [phone], [email or web address]  Mobile Crisis Teams:                                        Therapeutic Alternatives         Mobile Crisis Care Unit [phone]             [facility] [address] Ginette Otto [phone]                                         Interventionist [address] [place] [phone]  Self-Help/Support Groups: Mental Health Assoc. of [employer] of support groups [phone] (call for more info)  Narcotics Anonymous (NA) Caring Services [address] [place] - 2 meetings at this location  Residential Treatment Programs:  [facility]      [address]        [place]       [phone]         [facility] [address] [place] [address] [place], [place]  [address] [phone]  [facility]  [address] [place], [place]  [address] [phone] Admissions: 8am-3pm M-F  [facility]     [address]        [place], [place] [address]       [phone]         [facility] [address] [place], [place] [phone]  [facility] [address] [place], [place] [phone]  [facility] - Intensive Outpatient      [address]     [place], [place]       [phone]         [facility] ([facility]     [address] [place], [place] [phone] or [phone]  Residential Treatment Services (RTS), Medicaid [address] [place], [place] [phone]  [facility] [address]                                               [address] [place] [phone]  [facility] Resources: Estate manager/land agent [phone]               General Therapy  Angie Fava, PhD        [address] [place], [place] [address]         [phone]   Insurance  [facility]   [address] [place], [place] [address] [phone]  [facility] [address] [place], [place] [address] [phone] Insurance/Medicaid/sponsorship through [facility] and Families                                              [address]                                        [place], [place] [address]    Therapy/tele-psych/case         [phone]          [facility] [address], [place]  [address]  Adolescent/group home/case management [phone]                                           Creola Corn PhD       General therapy       Insurance   [phone]         Dr. Lolly Mustache, Insurance, M-F [phone]  [facility]  [employer] [facility] Dept. [address]                 [address]         [address] [place] [address]  Blondell Reveal Phone:  [phone]                                  Phone:  [phone]                   Phone:  [phone]  [facility], [phone] [facility]- [phone]       -     [facility] in [place], [address],             [phone], Insurance

## 2023-07-17 NOTE — ED Provider Notes (Signed)
Emergency Medicine Observation Re-evaluation Note  Vincent Frank is a 22 y.o. male, seen on rounds today.  Pt initially presented to the ED for complaints of Ingestion and Suicidal Currently, the patient is sleeping  Physical Exam  BP (!) 104/44   Pulse (!) 50   Temp 98.1 F (36.7 C) (Oral)   Resp 16   Ht 6\' 3"  (1.905 m)   Wt 106.6 kg   SpO2 97%   BMI 29.37 kg/m  Physical Exam General: Sleeping Cardiac: Extremities well-perfused Lungs: Breathing is unlabored Psych: Deferred  ED Course / MDM  EKG:EKG Interpretation Date/Time:  Saturday July 17 2023 01:43:18 EST Ventricular Rate:  65 PR Interval:  149 QRS Duration:  115 QT Interval:  409 QTC Calculation: 426 R Axis:   68  Text Interpretation: Sinus rhythm Incomplete right bundle branch block No significant change since last tracing Confirmed by Gilda Crease [phone]) on 07/17/2023 3:20:22 AM  I have reviewed the labs performed to date as well as medications administered while in observation.  Recent changes in the last 24 hours include arrival last night following an intentional ingestion of 3 Tide pods.  He was agitated and uncooperative.  IVC paperwork was completed.  He has been medically cleared.  TTS was consulted.  TTS evaluated and cleared him from psychiatric perspective.  Resources were provided in AVS.  Patient to be discharged.  Plan  Current plan is for discharge.    Gloris Manchester, MD 07/17/23 [phone]
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
## 2023-07-17 NOTE — ED Notes (Signed)
Rescintion completed copy placed in medical records original red folder

## 2023-07-17 NOTE — ED Notes (Signed)
Rescintion complete envelope [id number] copy in medical records original in red folder

## 2023-07-17 NOTE — ED Notes (Signed)
Pt valuables given to security, belongings placed in locker 11.

## 2023-07-17 NOTE — ED Notes (Signed)
Poison control: states they are closing the case but we can reach back out if we need to.
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
## 2023-07-18 LAB — CBC
HCT: 31 % — ABNORMAL LOW (ref 39.0–52.0)
HCT: 34.9 % — ABNORMAL LOW (ref 39.0–52.0)
Hemoglobin: 9.7 g/dL — ABNORMAL LOW (ref 13.0–17.0)
Hemoglobin: 11.1 g/dL — ABNORMAL LOW (ref 13.0–17.0)
MCH: 24.3 pg — ABNORMAL LOW (ref 26.0–34.0)
MCH: 24.4 pg — ABNORMAL LOW (ref 26.0–34.0)
MCHC: 31.3 g/dL (ref 30.0–36.0)
MCHC: 31.8 g/dL (ref 30.0–36.0)
MCV: 77.5 fL — ABNORMAL LOW (ref 80.0–100.0)
MCV: 76.7 fL — ABNORMAL LOW (ref 80.0–100.0)
Platelets: 177 10*3/uL (ref 150–400)
Platelets: 207 10*3/uL (ref 150–400)
RBC: 4 MIL/uL — ABNORMAL LOW (ref 4.22–5.81)
RBC: 4.55 MIL/uL (ref 4.22–5.81)
RDW: 14.5 % (ref 11.5–15.5)
RDW: 14.4 % (ref 11.5–15.5)
WBC: 7 10*3/uL (ref 4.0–10.5)
WBC: 6.8 10*3/uL (ref 4.0–10.5)
nRBC: 0.3 % — ABNORMAL HIGH (ref 0.0–0.2)
nRBC: 0 % (ref 0.0–0.2)

## 2023-07-18 LAB — BASIC METABOLIC PANEL
Anion gap: 7 (ref 5–15)
BUN: 8 mg/dL (ref 6–20)
CO2: 24 mmol/L (ref 22–32)
Calcium: 8.7 mg/dL — ABNORMAL LOW (ref 8.9–10.3)
Chloride: 110 mmol/L (ref 98–111)
Creatinine, Ser: 1.11 mg/dL (ref 0.61–1.24)
GFR, Estimated: >60 mL/min (ref >60)
Glucose, Bld: 92 mg/dL (ref 70–99)
Potassium: 3.7 mmol/L (ref 3.5–5.1)
Sodium: 141 mmol/L (ref 135–145)

## 2023-07-18 LAB — TRIGLYCERIDES: Triglycerides: 107 mg/dL (ref <150)

## 2023-07-18 LAB — CREATININE, SERUM
Creatinine, Ser: 0.94 mg/dL (ref 0.61–1.24)
GFR, Estimated: >60 mL/min (ref >60)

## 2023-07-18 MED ORDER — HYDROXYZINE HCL 25 MG PO TABS: 25.0000 mg | ORAL_TABLET | Freq: Three times a day (TID) | ORAL | Status: DC | PRN

## 2023-07-18 MED ORDER — TAMSULOSIN HCL 0.4 MG PO CAPS
0.4000 mg | ORAL_CAPSULE | Freq: Two times a day (BID) | ORAL | Status: DC
  Administered 2023-07-18 – 2023-07-20 (×5): 0.4 mg via ORAL
  Filled 2023-07-18 (×5): qty 1

## 2023-07-18 MED ORDER — CIPROFLOXACIN HCL 500 MG PO TABS: 500.0000 mg | ORAL_TABLET | Freq: Two times a day (BID) | ORAL | Status: DC

## 2023-07-18 MED ORDER — IBUPROFEN 200 MG PO TABS
600.0000 mg | ORAL_TABLET | Freq: Four times a day (QID) | ORAL | Status: DC | PRN
  Administered 2023-07-19 (×2): 600 mg via ORAL
  Filled 2023-07-18 (×2): qty 3

## 2023-07-18 MED ORDER — OXYBUTYNIN CHLORIDE 5 MG PO TABS
5.0000 mg | ORAL_TABLET | Freq: Three times a day (TID) | ORAL | Status: DC
  Administered 2023-07-18 – 2023-07-20 (×7): 5 mg via ORAL
  Filled 2023-07-18 (×9): qty 1

## 2023-07-18 MED ORDER — POLYETHYLENE GLYCOL 3350 17 G PO PACK
17.0000 g | PACK | Freq: Every day | ORAL | Status: DC | PRN
  Administered 2023-07-20: 17 g via ORAL
  Filled 2023-07-18: qty 1

## 2023-07-18 MED ORDER — FLUOXETINE HCL 20 MG PO CAPS
20.0000 mg | ORAL_CAPSULE | Freq: Every day | ORAL | Status: DC
  Administered 2023-07-19: 20 mg via ORAL
  Filled 2023-07-18: qty 1

## 2023-07-18 MED ORDER — DOCUSATE SODIUM 100 MG PO CAPS
100.0000 mg | ORAL_CAPSULE | Freq: Two times a day (BID) | ORAL | Status: DC
  Administered 2023-07-18 – 2023-07-20 (×5): 100 mg via ORAL
  Filled 2023-07-18 (×5): qty 1

## 2023-07-18 NOTE — ED Notes (Signed)
ED TO INPATIENT HANDOFF REPORT  ED Nurse Name and Phone #: Carollee Herter [phone] S Name/Age/Gender Vincent Frank 22 y.o. male Room/Bed: H022C/H022C  Code Status   Code Status: Full Code  Home/SNF/Other Home Patient oriented to: self, place, time, and situation Is this baseline? Yes   Triage Complete: Triage complete  Chief Complaint Trauma [T14.90XA]  Triage Note Pt BIB EMS as a suicide attempt. Pt jumped off bridge 30-45 ft. Pt was initially unresponsive but arrived gcs 15. C-collar. C/O back pain/ leg/ arm pain. EMS gave 50 mcgs of fentanyl.    Allergies Allergies  Allergen Reactions   Bee Venom Anaphylaxis   Haldol [Haloperidol] Anaphylaxis and Other (See Comments)    Dystonic reaction with muscle contractures   Hornet Venom Anaphylaxis   Peanut (Diagnostic) Anaphylaxis   Wasp Venom Anaphylaxis    Level of Care/Admitting Diagnosis ED Disposition     ED Disposition  Admit   Condition  --   Comment  Hospital Area: [facility] [[id number]]  Level of Care: ICU [6]  May admit patient to [facility] or [facility] if equivalent level of care is available:: No  Covid Evaluation: Asymptomatic - no recent exposure (last 10 days) testing not required  Diagnosis: Trauma [[id number]]  Admitting Physician: TRAUMA MD [[id number]]  Attending Physician: TRAUMA MD [[id number]]  Certification:: I certify this patient will need inpatient services for at least 2 midnights  Expected Medical Readiness: 07/20/2023          B Medical/Surgery History History reviewed. No pertinent past medical history. History reviewed. No pertinent surgical history.   A IV Location/Drains/Wounds Patient Lines/Drains/Airways Status     Active Line/Drains/Airways     Name Placement date Placement time Site Days   Peripheral IV 07/17/23 18 G 1" Left Antecubital 07/17/23  --  Antecubital  1   Peripheral IV 07/17/23 18 G 1" Right Antecubital 07/17/23  1507  Antecubital  1             Intake/Output Last 24 hours  Intake/Output Summary (Last 24 hours) at 07/18/2023 2595 Last data filed at 07/18/2023 6387 Gross per 24 hour  Intake 1292.16 ml  Output 100 ml  Net 1192.16 ml    Labs/Imaging Results for orders placed or performed during the hospital encounter of 07/17/23 (from the past 48 hours)  Urinalysis, Routine w reflex microscopic -Urine, Clean Catch     Status: Abnormal   Collection Time: 07/17/23  3:09 PM  Result Value Ref Range   Color, Urine YELLOW YELLOW   APPearance CLEAR CLEAR   Specific Gravity, Urine >1.046 (H) 1.005 - 1.030   pH 7.0 5.0 - 8.0   Glucose, UA NEGATIVE NEGATIVE mg/dL   Hgb urine dipstick NEGATIVE NEGATIVE   Bilirubin Urine NEGATIVE NEGATIVE   Ketones, ur NEGATIVE NEGATIVE mg/dL   Protein, ur NEGATIVE NEGATIVE mg/dL   Nitrite NEGATIVE NEGATIVE   Leukocytes,Ua NEGATIVE NEGATIVE    Comment: Performed at [facility], [address], [place], [place] [address]  Type and screen [facility]     Status: None   Collection Time: 07/17/23  3:17 PM  Result Value Ref Range   ABO/RH(D) A POS    Antibody Screen NEG    Sample Expiration      07/20/2023,2359 Performed at [facility], [address] [place], [place] [address]   ABO/Rh     Status: None   Collection Time: 07/17/23  3:22 PM  Result Value Ref Range  ABO/RH(D)      A POS Performed at [facility], [address] [place], [place] [address]   Comprehensive metabolic panel     Status: Abnormal   Collection Time: 07/17/23  3:23 PM  Result Value Ref Range   Sodium 142 135 - 145 mmol/L   Potassium 3.2 (L) 3.5 - 5.1 mmol/L   Chloride 116 (H) 98 - 111 mmol/L   CO2 19 (L) 22 - 32 mmol/L   Glucose, Bld 66 (L) 70 - 99 mg/dL    Comment: Glucose reference range applies only to samples taken after fasting for at least 8 hours.   BUN 9 6 - 20 mg/dL   Creatinine, Ser 4.78 0.61 - 1.24 mg/dL   Calcium 7.4 (L) 8.9 - 10.3 mg/dL   Total Protein 5.2  (L) 6.5 - 8.1 g/dL   Albumin 3.1 (L) 3.5 - 5.0 g/dL   AST 21 15 - 41 U/L   ALT 19 0 - 44 U/L   Alkaline Phosphatase 56 38 - 126 U/L   Total Bilirubin 0.5 <1.2 mg/dL   GFR, Estimated >29 >56 mL/min    Comment: (NOTE) Calculated using the CKD-EPI Creatinine Equation (2021)    Anion gap 7 5 - 15    Comment: Performed at [facility], [address] [place], [place] [address]  CBC     Status: Abnormal   Collection Time: 07/17/23  3:23 PM  Result Value Ref Range   WBC 5.9 4.0 - 10.5 K/uL   RBC 4.54 4.22 - 5.81 MIL/uL   Hemoglobin 10.9 (L) 13.0 - 17.0 g/dL   HCT 65.7 (L) 84.6 - 96.2 %   MCV 78.2 (L) 80.0 - 100.0 fL   MCH 24.0 (L) 26.0 - 34.0 pg   MCHC 30.7 30.0 - 36.0 g/dL   RDW 95.2 84.1 - 32.4 %   Platelets 212 150 - 400 K/uL   nRBC 0.0 0.0 - 0.2 %    Comment: Performed at [facility], [address], [place], [place] [address]  Ethanol     Status: None   Collection Time: 07/17/23  3:23 PM  Result Value Ref Range   Alcohol, Ethyl (B) <10 <10 mg/dL    Comment: (NOTE) Lowest detectable limit for serum alcohol is 10 mg/dL.  For medical purposes only. Performed at [facility], [address] [place], [place] [address]   Protime-INR     Status: Abnormal   Collection Time: 07/17/23  3:23 PM  Result Value Ref Range   Prothrombin Time 15.8 (H) 11.4 - 15.2 seconds   INR 1.2 0.8 - 1.2    Comment: (NOTE) INR goal varies based on device and disease states. Performed at [facility], [address] [place], [place] [address]   Acetaminophen level     Status: Abnormal   Collection Time: 07/17/23  3:23 PM  Result Value Ref Range   Acetaminophen (Tylenol), Serum <10 (L) 10 - 30 ug/mL    Comment: (NOTE) Therapeutic concentrations vary significantly. A range of 10-30 ug/mL  may be an effective concentration for many patients. However, some  are best treated at concentrations outside of this range. Acetaminophen concentrations >150 ug/mL at 4 hours after  ingestion  and >50 ug/mL at 12 hours after ingestion are often associated with  toxic reactions.  Performed at [facility], [address], [place], [place] [address]   Salicylate level     Status: Abnormal  Collection Time: 07/17/23  3:23 PM  Result Value Ref Range   Salicylate Lvl <7.0 (L) 7.0 - 30.0 mg/dL    Comment: Performed at [facility], [address], [place], [place] [address]  HIV Antibody (routine testing w rflx)     Status: None   Collection Time: 07/17/23  3:23 PM  Result Value Ref Range   HIV Screen 4th Generation wRfx Non Reactive Non Reactive    Comment: Performed at [facility], [address] [place], [place] [address]  I-Stat Chem 8, ED     Status: Abnormal   Collection Time: 07/17/23  3:25 PM  Result Value Ref Range   Sodium 147 (H) 135 - 145 mmol/L   Potassium 3.2 (L) 3.5 - 5.1 mmol/L   Chloride 117 (H) 98 - 111 mmol/L   BUN 7 6 - 20 mg/dL   Creatinine, Ser 5.62 0.61 - 1.24 mg/dL   Glucose, Bld 60 (L) 70 - 99 mg/dL    Comment: Glucose reference range applies only to samples taken after fasting for at least 8 hours.   Calcium, Ion 0.93 (L) 1.15 - 1.40 mmol/L   TCO2 17 (L) 22 - 32 mmol/L   Hemoglobin 9.2 (L) 13.0 - 17.0 g/dL   HCT 13.0 (L) 86.5 - 78.4 %  I-Stat Lactic Acid, ED     Status: None   Collection Time: 07/17/23  3:25 PM  Result Value Ref Range   Lactic Acid, Venous 1.3 0.5 - 1.9 mmol/L  Rapid urine drug screen (hospital performed)     Status: Abnormal   Collection Time: 07/17/23  3:53 PM  Result Value Ref Range   Opiates NONE DETECTED NONE DETECTED   Cocaine NONE DETECTED NONE DETECTED   Benzodiazepines POSITIVE (A) NONE DETECTED   Amphetamines NONE DETECTED NONE DETECTED   Tetrahydrocannabinol NONE DETECTED NONE DETECTED   Barbiturates NONE DETECTED NONE DETECTED    Comment: (NOTE) DRUG SCREEN FOR MEDICAL PURPOSES ONLY.  IF CONFIRMATION IS NEEDED FOR ANY PURPOSE, NOTIFY LAB WITHIN 5 DAYS.  LOWEST DETECTABLE  LIMITS FOR URINE DRUG SCREEN Drug Class                     Cutoff (ng/mL) Amphetamine and metabolites    1000 Barbiturate and metabolites    200 Benzodiazepine                 200 Opiates and metabolites        300 Cocaine and metabolites        300 THC                            50 Performed at [facility], [address] [place], [place] [address]   I-Stat arterial blood gas, ED     Status: Abnormal   Collection Time: 07/17/23  4:52 PM  Result Value Ref Range   pH, Arterial 7.440 7.35 - 7.45   pCO2 arterial 30.3 (L) 32 - 48 mmHg   pO2, Arterial 272 (H) 83 - 108 mmHg   Bicarbonate 20.6 20.0 - 28.0 mmol/L   TCO2 22 22 - 32 mmol/L   O2 Saturation 100 %   Acid-base deficit 3.0 (H) 0.0 - 2.0 mmol/L   Sodium 140 135 - 145 mmol/L   Potassium 3.7 3.5 - 5.1 mmol/L   Calcium, Ion 1.27 1.15 - 1.40 mmol/L   HCT 33.0 (L) 39.0 - 52.0 %  Hemoglobin 11.2 (L) 13.0 - 17.0 g/dL   Patient temperature 02.7 F    Collection site RADIAL, ALLEN'S TEST ACCEPTABLE    Drawn by RT    Sample type ARTERIAL   CBG monitoring, ED     Status: None   Collection Time: 07/17/23  5:59 PM  Result Value Ref Range   Glucose-Capillary 99 70 - 99 mg/dL    Comment: Glucose reference range applies only to samples taken after fasting for at least 8 hours.  CBC     Status: Abnormal   Collection Time: 07/18/23  6:10 AM  Result Value Ref Range   WBC 7.0 4.0 - 10.5 K/uL   RBC 4.00 (L) 4.22 - 5.81 MIL/uL   Hemoglobin 9.7 (L) 13.0 - 17.0 g/dL   HCT 25.3 (L) 66.4 - 40.3 %   MCV 77.5 (L) 80.0 - 100.0 fL   MCH 24.3 (L) 26.0 - 34.0 pg   MCHC 31.3 30.0 - 36.0 g/dL   RDW 47.4 25.9 - 56.3 %   Platelets 177 150 - 400 K/uL   nRBC 0.3 (H) 0.0 - 0.2 %    Comment: Performed at [facility], [address] [place], [place] [address]  Basic metabolic panel     Status: Abnormal   Collection Time: 07/18/23  6:10 AM  Result Value Ref Range   Sodium 141 135 - 145 mmol/L   Potassium 3.7 3.5 - 5.1 mmol/L   Chloride  110 98 - 111 mmol/L   CO2 24 22 - 32 mmol/L   Glucose, Bld 92 70 - 99 mg/dL    Comment: Glucose reference range applies only to samples taken after fasting for at least 8 hours.   BUN 8 6 - 20 mg/dL   Creatinine, Ser 3.32 0.61 - 1.24 mg/dL   Calcium 8.7 (L) 8.9 - 10.3 mg/dL   GFR, Estimated >95 >18 mL/min    Comment: (NOTE) Calculated using the CKD-EPI Creatinine Equation (2021)    Anion gap 7 5 - 15    Comment: Performed at [facility], [address], [place], [place] [address]  Triglycerides     Status: None   Collection Time: 07/18/23  6:10 AM  Result Value Ref Range   Triglycerides 107 <150 mg/dL    Comment: Performed at [facility], [address], [place], [place] [address]   CT HEAD WO CONTRAST Result Date: 07/17/2023 CLINICAL DATA:  Blunt trauma.  Patient jumped from a bridge. EXAM: CT HEAD WITHOUT CONTRAST CT CERVICAL SPINE WITHOUT CONTRAST TECHNIQUE: Multidetector CT imaging of the head and cervical spine was performed following the standard protocol without intravenous contrast. Multiplanar CT image reconstructions of the cervical spine were also generated. RADIATION DOSE REDUCTION: This exam was performed according to the departmental dose-optimization program which includes automated exposure control, adjustment of the mA and/or kV according to patient size and/or use of iterative reconstruction technique. COMPARISON:  None Available. FINDINGS: CT HEAD FINDINGS Brain: The ventricles and sulci are appropriate size for the patient's age. The gray-white matter discrimination is preserved. There is no acute intracranial hemorrhage. No mass effect or midline shift. No extra-axial fluid collection. Vascular: No hyperdense vessel or unexpected calcification. Skull: Normal. Negative for fracture or focal lesion. Sinuses/Orbits: There is mild mucoperiosteal thickening of paranasal sinuses. No air-fluid level. The mastoid air cells are clear. Other: None CT CERVICAL SPINE  FINDINGS Alignment: Normal. Skull base and vertebrae: No acute fracture. No primary bone lesion or focal pathologic  process. Soft tissues and spinal canal: No prevertebral fluid or swelling. No visible canal hematoma. Disc levels:  No acute findings. Upper chest: Negative. Other: An endotracheal and an enteric tube are partially visualized. IMPRESSION: 1. No acute intracranial pathology. 2. No acute/traumatic cervical spine pathology. These results were called by telephone at the time of interpretation on 07/17/2023 at 3:55 p.m. to Dr. Sheliah Hatch, who verbally acknowledged these results. Electronically Signed   By: Elgie Collard M.D.   On: 07/17/2023 16:12   CT CERVICAL SPINE WO CONTRAST Result Date: 07/17/2023 CLINICAL DATA:  Blunt trauma.  Patient jumped from a bridge. EXAM: CT HEAD WITHOUT CONTRAST CT CERVICAL SPINE WITHOUT CONTRAST TECHNIQUE: Multidetector CT imaging of the head and cervical spine was performed following the standard protocol without intravenous contrast. Multiplanar CT image reconstructions of the cervical spine were also generated. RADIATION DOSE REDUCTION: This exam was performed according to the departmental dose-optimization program which includes automated exposure control, adjustment of the mA and/or kV according to patient size and/or use of iterative reconstruction technique. COMPARISON:  None Available. FINDINGS: CT HEAD FINDINGS Brain: The ventricles and sulci are appropriate size for the patient's age. The gray-white matter discrimination is preserved. There is no acute intracranial hemorrhage. No mass effect or midline shift. No extra-axial fluid collection. Vascular: No hyperdense vessel or unexpected calcification. Skull: Normal. Negative for fracture or focal lesion. Sinuses/Orbits: There is mild mucoperiosteal thickening of paranasal sinuses. No air-fluid level. The mastoid air cells are clear. Other: None CT CERVICAL SPINE FINDINGS Alignment: Normal. Skull base and  vertebrae: No acute fracture. No primary bone lesion or focal pathologic process. Soft tissues and spinal canal: No prevertebral fluid or swelling. No visible canal hematoma. Disc levels:  No acute findings. Upper chest: Negative. Other: An endotracheal and an enteric tube are partially visualized. IMPRESSION: 1. No acute intracranial pathology. 2. No acute/traumatic cervical spine pathology. These results were called by telephone at the time of interpretation on 07/17/2023 at 3:55 p.m. to Dr. Sheliah Hatch, who verbally acknowledged these results. Electronically Signed   By: Elgie Collard M.D.   On: 07/17/2023 16:12   CT CHEST ABDOMEN PELVIS W CONTRAST Result Date: 07/17/2023 CLINICAL DATA:  Level 1 trauma. Patient jumped from a 40 foot bridge. EXAM: CT CHEST, ABDOMEN, AND PELVIS WITH CONTRAST TECHNIQUE: Multidetector CT imaging of the chest, abdomen and pelvis was performed following the standard protocol during bolus administration of intravenous contrast. RADIATION DOSE REDUCTION: This exam was performed according to the departmental dose-optimization program which includes automated exposure control, adjustment of the mA and/or kV according to patient size and/or use of iterative reconstruction technique. CONTRAST:  75mL OMNIPAQUE IOHEXOL 350 MG/ML SOLN COMPARISON:  None Available. FINDINGS: Evaluation of this exam is limited due to respiratory motion. CT CHEST FINDINGS Cardiovascular: There is no cardiomegaly or pericardial effusion. The thoracic aorta is unremarkable. The origins of the great vessels of the aortic arch and the central pulmonary arteries appear patent. Mediastinum/Nodes: No hilar or mediastinal adenopathy. An enteric tube noted in the esophagus. No mediastinal fluid collection. Residual thymic tissue noted in the anterior mediastinum. Lungs/Pleura: There are bibasilar streaky densities, likely atelectasis. Aspiration is less likely. There is no pleural effusion or pneumothorax. The central  airways are patent. Endotracheal tube with tip approximately 3.5 cm above the carina. Musculoskeletal: No acute osseous pathology. CT ABDOMEN PELVIS FINDINGS No intra-abdominal free air.  Trace fluid within the pelvis. Hepatobiliary: No focal liver abnormality is seen. No gallstones, gallbladder wall thickening, or biliary dilatation. Pancreas:  Unremarkable. No pancreatic ductal dilatation or surrounding inflammatory changes. Spleen: Normal in size without focal abnormality. Adrenals/Urinary Tract: The adrenal glands, kidneys, visualized ureters, and urinary bladder appear unremarkable. Stomach/Bowel: Enteric tube with tip in the proximal stomach. There is no bowel obstruction or active inflammation. The appendix is normal. Vascular/Lymphatic: The abdominal aorta and IVC are unremarkable. No portal venous gas. There is no adenopathy. Reproductive: The prostate and seminal vesicles are grossly unremarkable. No pelvic mass. Other: None Musculoskeletal: No acute or significant osseous findings. IMPRESSION: 1. No acute/traumatic intrathoracic, abdominal, or pelvic pathology. 2. Bibasilar atelectasis, less likely aspiration. 3. Trace free fluid in the pelvis. These results were called by telephone at the time of interpretation on 07/17/2023 at 3:55 p.m. to Dr. Sheliah Hatch, who verbally acknowledged these results. Electronically Signed   By: Elgie Collard M.D.   On: 07/17/2023 16:06   DG Pelvis Portable Result Date: 07/17/2023 CLINICAL DATA:  Level 1 trauma, suicide attempt, jump from bridge EXAM: PORTABLE PELVIS 1-2 VIEWS COMPARISON:  07/12/2023 CT abdomen/pelvis FINDINGS: No pelvic fracture or diastasis. No focal osseous lesions. No significant hip arthropathy. No hip dislocation. IMPRESSION: No pelvic fracture. Electronically Signed   By: Delbert Phenix M.D.   On: 07/17/2023 15:48   DG Chest Port 1 View Result Date: 07/17/2023 CLINICAL DATA:  Level 1 trauma, suicide attempt, jumped from bridge EXAM: PORTABLE  CHEST 1 VIEW COMPARISON:  06/08/2023 chest radiograph. FINDINGS: Endotracheal tube tip is 4.3 cm above the carina. Enteric tube enters stomach with the tip not seen on this image. Stable cardiomediastinal silhouette with normal heart size. No pneumothorax. No pleural effusion. Lungs appear clear, with no acute consolidative airspace disease and no pulmonary edema. No displaced fractures in the visualized chest. IMPRESSION: 1. Well-positioned endotracheal and enteric tubes. 2. No active cardiopulmonary disease. Electronically Signed   By: Delbert Phenix M.D.   On: 07/17/2023 15:45    Pending Labs Unresulted Labs (From admission, onward)     Start     Ordered   07/24/23 0500  Creatinine, serum  (enoxaparin (LOVENOX)    CrCl >/= 30 with major trauma, spinal cord injury, or selected orthopedic surgery)  Weekly,   R     Comments: while on enoxaparin therapy.    07/17/23 1557   07/18/23 0500  Triglycerides  (propofol (DIPRIVAN))  Every 72 hours,   R     Comments: While on propofol (DIPRIVAN)    07/17/23 1517   07/18/23 0500  Triglycerides  (propofol (DIPRIVAN))  Every 72 hours,   R     Comments: While on propofol (DIPRIVAN)    07/17/23 1557   07/17/23 1930  CBC  Once,   R        07/17/23 1930   07/17/23 1930  Creatinine, serum  Once,   R        07/17/23 1930   07/17/23 1617  Blood gas, arterial  Once,   R        07/17/23 1617            Vitals/Pain Today's Vitals   07/18/23 0700 07/18/23 0715 07/18/23 0800 07/18/23 0815  BP: 121/62 119/63 93/67 111/69  Pulse: 73 70  73  Resp: 15 16 15 16   Temp:      TempSrc:      SpO2: 98% 98%  98%  Weight:      Height:      PainSc: Asleep  10-Worst pain ever     Isolation Precautions No active isolations  Medications Medications  acetaminophen (TYLENOL) tablet 1,000 mg (1,000 mg Oral Given 07/18/23 0625)  methocarbamol (ROBAXIN) tablet 500 mg (500 mg Oral Given 07/18/23 9528)    Or  methocarbamol (ROBAXIN) injection 500 mg ( Intravenous  See Alternative 07/18/23 0821)  ondansetron (ZOFRAN-ODT) disintegrating tablet 4 mg (has no administration in time range)    Or  ondansetron (ZOFRAN) injection 4 mg (has no administration in time range)  metoprolol tartrate (LOPRESSOR) injection 5 mg (has no administration in time range)  hydrALAZINE (APRESOLINE) injection 10 mg (has no administration in time range)  enoxaparin (LOVENOX) injection 30 mg (has no administration in time range)  QUEtiapine (SEROQUEL) tablet 50 mg (50 mg Per Tube Given 07/17/23 2324)  FLUoxetine (PROZAC) capsule 40 mg (40 mg Oral Given 07/17/23 1630)  levETIRAcetam (KEPPRA) IVPB 500 mg/100 mL premix (0 mg Intravenous Stopped 07/18/23 0429)  ARIPiprazole (ABILIFY) tablet 5 mg (has no administration in time range)  lamoTRIgine (LAMICTAL) tablet 50 mg (50 mg Per Tube Given 07/17/23 2324)  hydrOXYzine (ATARAX) tablet 25 mg (has no administration in time range)  oxybutynin (DITROPAN) tablet 5 mg (has no administration in time range)  tamsulosin (FLOMAX) capsule 0.4 mg (has no administration in time range)  docusate sodium (COLACE) capsule 100 mg (has no administration in time range)  ibuprofen (ADVIL) tablet 600 mg (has no administration in time range)  polyethylene glycol (MIRALAX / GLYCOLAX) packet 17 g (has no administration in time range)  etomidate (AMIDATE) injection (20 mg Intravenous Given 07/17/23 1514)  succinylcholine (ANECTINE) injection (160 mg Intravenous Given 07/17/23 1514)  iohexol (OMNIPAQUE) 350 MG/ML injection 75 mL (75 mLs Intravenous Contrast Given 07/17/23 1545)  fentaNYL (SUBLIMAZE) injection 50 mcg (50 mcg Intravenous Given 07/17/23 1550)  flumazenil (ROMAZICON) injection 0.5 mg (0.5 mg Intravenous Given 07/17/23 1858)    Mobility walks     Focused Assessments    R Recommendations: See Admitting Provider Note  Report given to:   Additional Notes:

## 2023-07-18 NOTE — Progress Notes (Signed)
Progress Note     Subjective: Drowsy this am and requires encouragement to answer questions. He complains of pain all over but worst in neck and back. He is oriented to self but denies recollection of events of yesterday or orientation to current location. When asked if he was trying to harm himself yesterday he states he can't remember. He answers yes when asked if he has tried to harm himself in the past. He denies current SI, HI.    Objective: Vital signs in last 24 hours: Temp:  [95.9 F (35.5 C)-98.7 F (37.1 C)] 97.8 F (36.6 C) (12/22 0547) Pulse Rate:  [54-84] 73 (12/22 0700) Resp:  [9-26] 15 (12/22 0700) BP: (110-168)/(56-138) 121/62 (12/22 0700) SpO2:  [91 %-100 %] 98 % (12/22 0700) FiO2 (%):  [60 %] 60 % (12/21 1548) Weight:  [110 kg] 110 kg (12/21 1619) Last BM Date : 07/15/23  Intake/Output from previous day: 12/21 0701 - 12/22 0700 In: 1292.2 [I.V.:1192.2; IV Piggyback:100] Out: 100 [Urine:100] Intake/Output this shift: No intake/output data recorded.  PE: General: WD, male who is laying in bed in NAD HEENT: head is normocephalic, atraumatic.  Sclera are noninjected.  Pupils equal and round. EOMs intact.  Ears and nose without any masses or lesions.  Mouth is pink and moist Heart: regular, rate, and rhythm.  Normal s1,s2. No obvious murmurs, gallops, or rubs noted.  Palpable radial and pedal pulses bilaterally Lungs: CTAB, no wheezes, rhonchi, or rales noted.  Respiratory effort nonlabored Abd: soft, ND, +BS, no masses, hernias, or organomegaly. Mild TTP diffusely but greatest in LLQ without rebound or guarding. Pain out of proportion to exam MSK: all 4 extremities are symmetrical with no cyanosis, clubbing, or edema. Pain to palpation of spine from cervical spine through lumbar spine. No traumatic injuries visible. AROM intact Skin: warm and dry with no masses, or rashes. Superficial abrasions to bilateral Les which do not appear acute Neuro: Cranial nerves  2-12 grossly intact, sensation is normal throughout Psych: A&Ox3 with an appropriate affect.    Lab Results:  Recent Labs    07/17/23 1523 07/17/23 1525 07/17/23 1652 07/18/23 0610  WBC 5.9  --   --  7.0  HGB 10.9*   < > 11.2* 9.7*  HCT 35.5*   < > 33.0* 31.0*  PLT 212  --   --  177   < > = values in this interval not displayed.   BMET Recent Labs    07/17/23 1523 07/17/23 1525 07/17/23 1652 07/18/23 0610  NA 142 147* 140 141  K 3.2* 3.2* 3.7 3.7  CL 116* 117*  --  110  CO2 19*  --   --  24  GLUCOSE 66* 60*  --  92  BUN 9 7  --  8  CREATININE 1.06 1.10  --  1.11  CALCIUM 7.4*  --   --  8.7*   PT/INR Recent Labs    07/17/23 1523  LABPROT 15.8*  INR 1.2   CMP     Component Value Date/Time   NA 141 07/18/2023 0610   K 3.7 07/18/2023 0610   CL 110 07/18/2023 0610   CO2 24 07/18/2023 0610   GLUCOSE 92 07/18/2023 0610   BUN 8 07/18/2023 0610   CREATININE 1.11 07/18/2023 0610   CALCIUM 8.7 (L) 07/18/2023 0610   PROT 5.2 (L) 07/17/2023 1523   ALBUMIN 3.1 (L) 07/17/2023 1523   AST 21 07/17/2023 1523   ALT 19 07/17/2023 1523   ALKPHOS  56 07/17/2023 1523   BILITOT 0.5 07/17/2023 1523   GFRNONAA >60 07/18/2023 0610   Lipase  No results found for: "LIPASE"     Studies/Results: CT HEAD WO CONTRAST Result Date: 07/17/2023 CLINICAL DATA:  Blunt trauma.  Patient jumped from a bridge. EXAM: CT HEAD WITHOUT CONTRAST CT CERVICAL SPINE WITHOUT CONTRAST TECHNIQUE: Multidetector CT imaging of the head and cervical spine was performed following the standard protocol without intravenous contrast. Multiplanar CT image reconstructions of the cervical spine were also generated. RADIATION DOSE REDUCTION: This exam was performed according to the departmental dose-optimization program which includes automated exposure control, adjustment of the mA and/or kV according to patient size and/or use of iterative reconstruction technique. COMPARISON:  None Available. FINDINGS: CT HEAD  FINDINGS Brain: The ventricles and sulci are appropriate size for the patient's age. The gray-white matter discrimination is preserved. There is no acute intracranial hemorrhage. No mass effect or midline shift. No extra-axial fluid collection. Vascular: No hyperdense vessel or unexpected calcification. Skull: Normal. Negative for fracture or focal lesion. Sinuses/Orbits: There is mild mucoperiosteal thickening of paranasal sinuses. No air-fluid level. The mastoid air cells are clear. Other: None CT CERVICAL SPINE FINDINGS Alignment: Normal. Skull base and vertebrae: No acute fracture. No primary bone lesion or focal pathologic process. Soft tissues and spinal canal: No prevertebral fluid or swelling. No visible canal hematoma. Disc levels:  No acute findings. Upper chest: Negative. Other: An endotracheal and an enteric tube are partially visualized. IMPRESSION: 1. No acute intracranial pathology. 2. No acute/traumatic cervical spine pathology. These results were called by telephone at the time of interpretation on 07/17/2023 at 3:55 p.m. to Dr. Sheliah Hatch, who verbally acknowledged these results. Electronically Signed   By: Elgie Collard M.D.   On: 07/17/2023 16:12   CT CERVICAL SPINE WO CONTRAST Result Date: 07/17/2023 CLINICAL DATA:  Blunt trauma.  Patient jumped from a bridge. EXAM: CT HEAD WITHOUT CONTRAST CT CERVICAL SPINE WITHOUT CONTRAST TECHNIQUE: Multidetector CT imaging of the head and cervical spine was performed following the standard protocol without intravenous contrast. Multiplanar CT image reconstructions of the cervical spine were also generated. RADIATION DOSE REDUCTION: This exam was performed according to the departmental dose-optimization program which includes automated exposure control, adjustment of the mA and/or kV according to patient size and/or use of iterative reconstruction technique. COMPARISON:  None Available. FINDINGS: CT HEAD FINDINGS Brain: The ventricles and sulci are  appropriate size for the patient's age. The gray-white matter discrimination is preserved. There is no acute intracranial hemorrhage. No mass effect or midline shift. No extra-axial fluid collection. Vascular: No hyperdense vessel or unexpected calcification. Skull: Normal. Negative for fracture or focal lesion. Sinuses/Orbits: There is mild mucoperiosteal thickening of paranasal sinuses. No air-fluid level. The mastoid air cells are clear. Other: None CT CERVICAL SPINE FINDINGS Alignment: Normal. Skull base and vertebrae: No acute fracture. No primary bone lesion or focal pathologic process. Soft tissues and spinal canal: No prevertebral fluid or swelling. No visible canal hematoma. Disc levels:  No acute findings. Upper chest: Negative. Other: An endotracheal and an enteric tube are partially visualized. IMPRESSION: 1. No acute intracranial pathology. 2. No acute/traumatic cervical spine pathology. These results were called by telephone at the time of interpretation on 07/17/2023 at 3:55 p.m. to Dr. Sheliah Hatch, who verbally acknowledged these results. Electronically Signed   By: Elgie Collard M.D.   On: 07/17/2023 16:12   CT CHEST ABDOMEN PELVIS W CONTRAST Result Date: 07/17/2023 CLINICAL DATA:  Level 1 trauma. Patient jumped from  a 40 foot bridge. EXAM: CT CHEST, ABDOMEN, AND PELVIS WITH CONTRAST TECHNIQUE: Multidetector CT imaging of the chest, abdomen and pelvis was performed following the standard protocol during bolus administration of intravenous contrast. RADIATION DOSE REDUCTION: This exam was performed according to the departmental dose-optimization program which includes automated exposure control, adjustment of the mA and/or kV according to patient size and/or use of iterative reconstruction technique. CONTRAST:  75mL OMNIPAQUE IOHEXOL 350 MG/ML SOLN COMPARISON:  None Available. FINDINGS: Evaluation of this exam is limited due to respiratory motion. CT CHEST FINDINGS Cardiovascular: There is no  cardiomegaly or pericardial effusion. The thoracic aorta is unremarkable. The origins of the great vessels of the aortic arch and the central pulmonary arteries appear patent. Mediastinum/Nodes: No hilar or mediastinal adenopathy. An enteric tube noted in the esophagus. No mediastinal fluid collection. Residual thymic tissue noted in the anterior mediastinum. Lungs/Pleura: There are bibasilar streaky densities, likely atelectasis. Aspiration is less likely. There is no pleural effusion or pneumothorax. The central airways are patent. Endotracheal tube with tip approximately 3.5 cm above the carina. Musculoskeletal: No acute osseous pathology. CT ABDOMEN PELVIS FINDINGS No intra-abdominal free air.  Trace fluid within the pelvis. Hepatobiliary: No focal liver abnormality is seen. No gallstones, gallbladder wall thickening, or biliary dilatation. Pancreas: Unremarkable. No pancreatic ductal dilatation or surrounding inflammatory changes. Spleen: Normal in size without focal abnormality. Adrenals/Urinary Tract: The adrenal glands, kidneys, visualized ureters, and urinary bladder appear unremarkable. Stomach/Bowel: Enteric tube with tip in the proximal stomach. There is no bowel obstruction or active inflammation. The appendix is normal. Vascular/Lymphatic: The abdominal aorta and IVC are unremarkable. No portal venous gas. There is no adenopathy. Reproductive: The prostate and seminal vesicles are grossly unremarkable. No pelvic mass. Other: None Musculoskeletal: No acute or significant osseous findings. IMPRESSION: 1. No acute/traumatic intrathoracic, abdominal, or pelvic pathology. 2. Bibasilar atelectasis, less likely aspiration. 3. Trace free fluid in the pelvis. These results were called by telephone at the time of interpretation on 07/17/2023 at 3:55 p.m. to Dr. Sheliah Hatch, who verbally acknowledged these results. Electronically Signed   By: Elgie Collard M.D.   On: 07/17/2023 16:06   DG Pelvis  Portable Result Date: 07/17/2023 CLINICAL DATA:  Level 1 trauma, suicide attempt, jump from bridge EXAM: PORTABLE PELVIS 1-2 VIEWS COMPARISON:  07/12/2023 CT abdomen/pelvis FINDINGS: No pelvic fracture or diastasis. No focal osseous lesions. No significant hip arthropathy. No hip dislocation. IMPRESSION: No pelvic fracture. Electronically Signed   By: Delbert Phenix M.D.   On: 07/17/2023 15:48   DG Chest Port 1 View Result Date: 07/17/2023 CLINICAL DATA:  Level 1 trauma, suicide attempt, jumped from bridge EXAM: PORTABLE CHEST 1 VIEW COMPARISON:  06/08/2023 chest radiograph. FINDINGS: Endotracheal tube tip is 4.3 cm above the carina. Enteric tube enters stomach with the tip not seen on this image. Stable cardiomediastinal silhouette with normal heart size. No pneumothorax. No pleural effusion. Lungs appear clear, with no acute consolidative airspace disease and no pulmonary edema. No displaced fractures in the visualized chest. IMPRESSION: 1. Well-positioned endotracheal and enteric tubes. 2. No active cardiopulmonary disease. Electronically Signed   By: Delbert Phenix M.D.   On: 07/17/2023 15:45    Anti-infectives: Anti-infectives (From admission, onward)    None        Assessment/Plan Suicide attempt, 30-40 foot jump into ravine  - intubated in ED for being nonresponsive. Now extubated and good respiratory status - trauma scans completed and no traumatic injuries. No new injuries on tertiary exam. Complains of diffuse  pain without evidence of injury. Ibuprofen/tylenol and PT ordered - home psych meds ordered - h/o multiple prior attempts per chart review with most recent 12/20 - ingestion of tide pods - psych consult for suicide attempt and possible psych admission   FEN: reg ID: cipro listed in home meds - unsure of indication on chart review. Will hold for now VTE: lovenox  I reviewed ED provider notes, last 24 h vitals and pain scores, last 48 h intake and output, last 24 h labs and  trends, and last 24 h imaging results.     LOS: 1 day   Eric Form, [facility] 07/18/2023, 8:03 AM Please see Amion for pager number during day hours 7:00am-4:30pm

## 2023-07-18 NOTE — Consult Note (Shared)
[facility] Psychiatric Consult Initial  Patient Name: .Vincent Frank  MRN: [id number]  DOB: 2001/07/04  Consult Order details:  Orders (From admission, onward)     Start     Ordered   07/18/23 0723  IP CONSULT TO PSYCHIATRY       Ordering Provider: Eric Form, PA-C  Provider:  (Not yet assigned)  Question Answer Comment  Location [facility]   Reason for Consult? Suicide attempt      07/18/23 7829             Mode of Visit: {Type of visit:31911}    Psychiatry Consult Evaluation  Service Date: July 18, 2023 LOS:  LOS: 1 day  Chief Complaint ***  Primary Psychiatric Diagnoses  *** 2.  *** 3.  ***  Assessment  Vincent Frank is a 22 y.o. male admitted: {CHL BH Medical or Presented to FA:21308}[facility] 07/17/2023  3:06 PM for ***. He carries the psychiatric diagnoses of *** and has a past medical history of  ***.   His current presentation of *** is most consistent with ***. He meets criteria for *** based on ***.  Current outpatient psychotropic medications include *** and historically he has had a *** response to these medications. He was *** compliant with medications prior to admission as evidenced by ***. On initial examination, patient ***. Please see plan below for detailed recommendations.   Diagnoses:  Active Hospital problems: Principal Problem:   Trauma    Plan   ## Psychiatric Medication Recommendations:  -- switch keppra to another antiepileptic due to side effects possibly worsening presentation, patient started on lamotragine for this reason.   ## Medical Decision Making Capacity: {CHL BH MEDICAL DECISION MAKING CAPACITY:31818}  ## Further Work-up:  -- *** {CHLmacgeneralandspecificworkuprecs:31821} -- most recent EKG on *** had QtC of *** -- Pertinent labwork reviewed earlier this admission includes: ***   ## Disposition:-- {CHLmaccldispo:31820}  ## Behavioral / Environmental:  -{CHLmacbehavioralenvironmental2:31847}    ## Safety and Observation Level:  - Based on my clinical evaluation, I estimate the patient to be at *** risk of self harm in the current setting. - At this time, we recommend  {CHL BH SUICIDE OBSERVATION LEVEL:31850}. This decision is based on my review of the chart including patient's history and current presentation, interview of the patient, mental status examination, and consideration of suicide risk including evaluating suicidal ideation, plan, intent, suicidal or self-harm behaviors, risk factors, and protective factors. This judgment is based on our ability to directly address suicide risk, implement suicide prevention strategies, and develop a safety plan while the patient is in the clinical setting. Please contact our team if there is a concern that risk level has changed.  CSSR Risk Category:C-SSRS RISK CATEGORY: High Risk  Suicide Risk Assessment: Patient has following modifiable risk factors for suicide: {CHLmacmodifiablesuicideriskfactors:31822}, which we are addressing by ***. Patient has following non-modifiable or demographic risk factors for suicide: {CHLmacnonmodifiablesuicideriskfactors:31823} Patient has the following protective factors against suicide: {CHLmacprotectivefactors:31824}  Thank you for this consult request. Recommendations have been communicated to the primary team.  We will *** at this time.   Meryl Dare, MD       History of Present Illness  Relevant Aspects of [facility] or ED course:31819} Course:  Admitted on 07/17/2023 for ***. They ***.   Patient Report:  ***  Psych ROS:  Depression: *** Anxiety:  *** Mania (lifetime and current): *** Psychosis: (lifetime and current): ***  Collateral information:  Contacted *** at ***  on ***  ROS   Psychiatric and Social History  Psychiatric History:  Information collected from ***  Prev Dx/Sx: *** Current Psych Provider: *** Home Meds  (current): *** Previous Med Trials: *** Therapy: ***  Prior Psych Hospitalization: ***  Prior Self Harm: *** Prior Violence: ***  Family Psych History: *** Family Hx suicide: ***  Social History:  Developmental Hx: *** Educational Hx: *** Occupational Hx: *** Legal Hx: *** Living Situation: *** Spiritual Hx: *** Access to weapons/lethal means: ***   Substance History Alcohol: ***  Type of alcohol *** Last Drink *** Number of drinks per day *** History of alcohol withdrawal seizures *** History of DT's *** Tobacco: *** Illicit drugs: *** Prescription drug abuse: *** Rehab hx: ***  Exam Findings  Physical Exam: *** Vital Signs:  Temp:  [95.9 F (35.5 C)-98.7 F (37.1 C)] 97.8 F (36.6 C) (12/22 0934) Pulse Rate:  [54-84] 71 (12/22 0915) Resp:  [9-26] 20 (12/22 0915) BP: (93-168)/(56-138) 129/68 (12/22 0915) SpO2:  [91 %-100 %] 100 % (12/22 0915) FiO2 (%):  [60 %] 60 % (12/21 1548) Weight:  [110 kg] 110 kg (12/21 1619) Blood pressure 129/68, pulse 71, temperature 97.8 F (36.6 C), resp. rate 20, height 6\' 4"  (1.93 m), weight 110 kg, SpO2 100%. Body mass index is 29.52 kg/m.  Physical Exam  Mental Status Exam: General Appearance: {Appearance:22683}  Orientation:  {BHH ORIENTATION (PAA):22689}  Memory:  {BHH MEMORY:22881}  Concentration:  {Concentration:21399}  Recall:  {BHH GOOD/FAIR/POOR:22877}  Attention  {BH Attention Span:31825}  Eye Contact:  {BHH EYE CONTACT:22684}  Speech:  {Speech:22685}  Language:  {BHH GOOD/FAIR/POOR:22877}  Volume:  {Volume (PAA):22686}  Mood: ***  Affect:  {Affect (PAA):22687}  Thought Process:  {Thought Process (PAA):22688}  Thought Content:  {Thought Content:22690}  Suicidal Thoughts:  {ST/HT (PAA):22692}  Homicidal Thoughts:  {ST/HT (PAA):22692}  Judgement:  {Judgement (PAA):22694}  Insight:  {Insight (PAA):22695}  Psychomotor Activity:  {Psychomotor (PAA):22696}  Akathisia:  {BHH YES OR NO:22294}  Fund of Knowledge:   {BHH GOOD/FAIR/POOR:22877}      Assets:  {Assets (PAA):22698}  Cognition:  {chl bhh cognition:304700322}  ADL's:  {BHH WRU'E:45409}  AIMS (if indicated):        Other History   These have been pulled in through the EMR, reviewed, and updated if appropriate.  Family History:  The patient's family history is not on file.  Medical History: History reviewed. No pertinent past medical history.  Surgical History: History reviewed. No pertinent surgical history.   Medications:   Current Facility-Administered Medications:    acetaminophen (TYLENOL) tablet 1,000 mg, 1,000 mg, Oral, Q6H, Kinsinger, De Blanch, MD, 1,000 mg at 07/18/23 8119   ARIPiprazole (ABILIFY) tablet 5 mg, 5 mg, Per Tube, Daily, Kinsinger, De Blanch, MD   docusate sodium (COLACE) capsule 100 mg, 100 mg, Oral, BID, Kabrich, Martha H, PA-C   enoxaparin (LOVENOX) injection 30 mg, 30 mg, Subcutaneous, Q12H, Kinsinger, De Blanch, MD   FLUoxetine (PROZAC) capsule 40 mg, 40 mg, Oral, Daily, Kinsinger, De Blanch, MD, 40 mg at 07/17/23 1630   hydrALAZINE (APRESOLINE) injection 10 mg, 10 mg, Intravenous, Q2H PRN, Kinsinger, De Blanch, MD   hydrOXYzine (ATARAX) tablet 25 mg, 25 mg, Oral, TID PRN, Eric Form, PA-C   ibuprofen (ADVIL) tablet 600 mg, 600 mg, Oral, Q6H PRN, Kabrich, Martha H, PA-C   lamoTRIgine (LAMICTAL) tablet 50 mg, 50 mg, Per Tube, QHS, Kinsinger, De Blanch, MD, 50 mg at 07/17/23 2324   levETIRAcetam (KEPPRA) IVPB 500 mg/100 mL premix,  500 mg, Intravenous, Q12H, Kinsinger, De Blanch, MD, Stopped at 07/18/23 0429   methocarbamol (ROBAXIN) tablet 500 mg, 500 mg, Oral, Q8H, 500 mg at 07/18/23 1191 **OR** methocarbamol (ROBAXIN) injection 500 mg, 500 mg, Intravenous, Q8H, Kinsinger, De Blanch, MD, 500 mg at 07/17/23 1630   metoprolol tartrate (LOPRESSOR) injection 5 mg, 5 mg, Intravenous, Q6H PRN, Kinsinger, De Blanch, MD   ondansetron (ZOFRAN-ODT) disintegrating tablet 4 mg, 4 mg, Oral, Q6H PRN **OR**  ondansetron (ZOFRAN) injection 4 mg, 4 mg, Intravenous, Q6H PRN, Kinsinger, De Blanch, MD   oxybutynin (DITROPAN) tablet 5 mg, 5 mg, Oral, TID, Kabrich, Martha H, PA-C   polyethylene glycol (MIRALAX / GLYCOLAX) packet 17 g, 17 g, Oral, Daily PRN, Kabrich, Martha H, PA-C   QUEtiapine (SEROQUEL) tablet 50 mg, 50 mg, Per Tube, Q12H, Kinsinger, De Blanch, MD, 50 mg at 07/17/23 2324   tamsulosin (FLOMAX) capsule 0.4 mg, 0.4 mg, Oral, BID, Eric Form, PA-C  Allergies: Allergies  Allergen Reactions   Bee Venom Anaphylaxis   Haldol [Haloperidol] Anaphylaxis and Other (See Comments)    Dystonic reaction with muscle contractures   Hornet Venom Anaphylaxis   Peanut (Diagnostic) Anaphylaxis   Wasp Venom Anaphylaxis    Meryl Dare, MD

## 2023-07-18 NOTE — Evaluation (Signed)
Physical Therapy Evaluation Patient Details Name: Vincent Frank MRN: [id number] DOB: [birth year] Today's Date: 07/18/2023  History of Present Illness  Pt is a 22 y.o male who presented 07/17/23 s/p suicide attempt after pt jumped off a ravine about 30 to 40 feet and landed in a creek bed. Intubated and extubated 12/21. Trauma scans completed and no traumatic injuries. PMH: PNES, MDD, GAD, ?ADHD, numerous suicide attempts and psychiatric hospitalization   Clinical Impression  The pt is currently obtunded and needs a lot of stimulation to remain awake to mobilize briefly and answer a few questions. RN reports his low level of arousal is likely due to meds this date. He is more alert once sitting or standing and quickly falls back to sleep when supine. Thus, his presentation in regards to his cognition, strength, balance, etc is likely inaccurate at this time. He will need further assessment as his level of arousal improves. He also shakes and begins to breathe rapidly and shallowly with mobility, seemingly due to pain. At this time due to the factors above, pt is demonstrating deficits in balance, activity tolerance, and strength. He is currently requiring modA for bed mobility, modA to transfer to stand with HHA and bil knees blocked, and maxA to step laterally 2x along EOB with HHA. He did report he resides with his parents and works as a IT sales professional in [employer]. Was unable to obtain more PLOF/home info before he fell asleep again. Considering pt's young age and that he did not sustain any traumatic injuries, I anticipate pt will progress well as his pain and level of arousal improves. Thus, he will likely not need follow-up PT. Based on chart review, it appears he may be admitted to inpt psych. Will continue to follow acutely to maximize his return to independence prior to d/c to an inpt psych facility.      If plan is discharge home, recommend the following: A lot of help with walking and/or  transfers;A lot of help with bathing/dressing/bathroom;Assistance with cooking/housework;Direct supervision/assist for medications management;Direct supervision/assist for financial management;Assist for transportation;Help with stairs or ramp for entrance;Supervision due to cognitive status   Can travel by private vehicle        Equipment Recommendations Other (comment) (TBD, anticipate none though)  Recommendations for Other Services  OT consult    Functional Status Assessment Patient has had a recent decline in their functional status and demonstrates the ability to make significant improvements in function in a reasonable and predictable amount of time.     Precautions / Restrictions Precautions Precautions: Fall Restrictions Weight Bearing Restrictions Per Provider Order: No      Mobility  Bed Mobility Overal bed mobility: Needs Assistance Bed Mobility: Supine to Sit, Sit to Supine     Supine to sit: Mod assist Sit to supine: Mod assist   General bed mobility comments: PT brought pt's legs off L EOB, which assisted in arousing pt enough to follow cues to initiate ascending his trunk to sit up, modA needed at trunk due to evident pain with mobility. Pt returned his trunk to supine but PT brought his legs back onto bed as pt quickly fell back asleep.    Transfers Overall transfer level: Needs assistance Equipment used: 1 person hand held assist Transfers: Sit to/from Stand Sit to Stand: Mod assist           General transfer comment: Pt initiated transfer to stand when cued, but needed modA to extend his hips, bring his chest up, and power up  to stand while knees were blocked.    Ambulation/Gait Ambulation/Gait assistance: Max assist Gait Distance (Feet): 1 Feet Assistive device: 1 person hand held assist Gait Pattern/deviations: Step-to pattern, Decreased step length - right, Decreased step length - left, Decreased stride length, Knees buckling, Trunk flexed,  Leaning posteriorly, Decreased weight shift to right, Decreased weight shift to left Gait velocity: reduced Gait velocity interpretation: <1.31 ft/sec, indicative of household ambulator   General Gait Details: Pt not following cues well to take steps along EOB, needing maxA to shift weight bil and slide each foot to step to L 2x before sitting. Knees blocked during stance phase due to noted buckling.  Stairs            Wheelchair Mobility     Tilt Bed    Modified Rankin (Stroke Patients Only)       Balance Overall balance assessment: Needs assistance Sitting-balance support: Bilateral upper extremity supported, Feet supported Sitting balance-Leahy Scale: Poor Sitting balance - Comments: UE support and up to modA for static sitting balance Postural control: Posterior lean Standing balance support: Bilateral upper extremity supported, During functional activity Standing balance-Leahy Scale: Poor Standing balance comment: UE support and modA to stand with bil knees blocked and cues to extend hips and trunk                             Pertinent Vitals/Pain Pain Assessment Pain Assessment: Faces Faces Pain Scale: Hurts even more Pain Location: pt would not indicate but sitter reported pt had been complaining of neck and back pain earlier Pain Descriptors / Indicators: Discomfort, Grimacing, Guarding, Other (Comment) (pt shaking and having rapid shallow breaths) Pain Intervention(s): Monitored during session, Limited activity within patient's tolerance, Repositioned    Home Living Family/patient expects to be discharged to:: Private residence Living Arrangements: Parent (mother and father) Available Help at Discharge: Family               Additional Comments: pt fell asleep before could get further information    Prior Function Prior Level of Function : Independent/Modified Independent;Working/employed               ADLs Comments: Working as a  IT sales professional in [employer]     Extremity/Trunk Assessment   Upper Extremity Assessment Upper Extremity Assessment: Defer to OT evaluation    Lower Extremity Assessment Lower Extremity Assessment: Generalized weakness;Difficult to assess due to impaired cognition    Cervical / Trunk Assessment Cervical / Trunk Assessment: Normal  Communication   Communication Communication: Other (comment) (soft spoken and mumbling) Cueing Techniques: Verbal cues;Tactile cues;Gestural cues  Cognition Arousal: Obtunded Behavior During Therapy: Flat affect Overall Cognitive Status: Impaired/Different from baseline Area of Impairment: Attention, Following commands, Problem solving                   Current Attention Level: Focused   Following Commands: Follows one step commands inconsistently, Follows one step commands with increased time     Problem Solving: Slow processing, Decreased initiation, Difficulty sequencing, Requires verbal cues, Requires tactile cues General Comments: Pt limited by his low level of arousal this date, RN reporting this is related to meds. Thus cognition was difficult to assess. He would benefit from further assessment. Pt drooling.        General Comments      Exercises     Assessment/Plan    PT Assessment Patient needs continued PT services  PT Problem List Decreased  strength;Decreased activity tolerance;Decreased balance;Decreased mobility;Decreased cognition;Decreased coordination       PT Treatment Interventions DME instruction;Gait training;Stair training;Functional mobility training;Therapeutic activities;Balance training;Therapeutic exercise;Neuromuscular re-education;Cognitive remediation;Patient/family education    PT Goals (Current goals can be found in the Care Plan section)  Acute Rehab PT Goals Patient Stated Goal: did not state PT Goal Formulation: Patient unable to participate in goal setting Time For Goal Achievement:  08/01/23 Potential to Achieve Goals: Good    Frequency Min 1X/week     Co-evaluation               AM-PAC PT "6 Clicks" Mobility  Outcome Measure Help needed turning from your back to your side while in a flat bed without using bedrails?: A Lot Help needed moving from lying on your back to sitting on the side of a flat bed without using bedrails?: A Lot Help needed moving to and from a bed to a chair (including a wheelchair)?: A Lot Help needed standing up from a chair using your arms (e.g., wheelchair or bedside chair)?: A Lot Help needed to walk in hospital room?: Total Help needed climbing 3-5 steps with a railing? : Total 6 Click Score: 10    End of Session Equipment Utilized During Treatment: Gait belt Activity Tolerance: Patient limited by lethargy Patient left: in bed;with call bell/phone within reach;with bed alarm set;with nursing/sitter in room Nurse Communication: Mobility status PT Visit Diagnosis: Unsteadiness on feet (R26.81);Other abnormalities of gait and mobility (R26.89);Muscle weakness (generalized) (M62.81);Difficulty in walking, not elsewhere classified (R26.2)    Time: 1250-1301 PT Time Calculation (min) (ACUTE ONLY): 11 min   Charges:   PT Evaluation $PT Eval Low Complexity: 1 Low   PT General Charges $$ ACUTE PT VISIT: 1 Visit         Virgil Benedict, PT, DPT Acute Rehabilitation Services  Office: [phone]   Bettina Gavia 07/18/2023, 2:32 PM

## 2023-07-18 NOTE — Consult Note (Cosign Needed Addendum)
Brief Psychiatry Consult Note  Vincent Frank is a 22 y.o. male with past history of PNES, MDD, GAD, ?ADHD, numerous suicide attempts and psychiatric hospitalizations recently who presents following suicide attempt by jumping off bridge.  Per chart review, patient is followed by [facility] and last seen outpatient on 05/04/2023. Outpatient note stated: patient has worsening symptoms over past 1 year since parents disowned brother and his relationship with parents deteriorated.  After psych hospitalization in Nov at [facility], meds increased to prozac 60 mg, abilify 5 mg, trazadone 50 mg nightly. Per Dr. 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 Several days ago, pt seen in [facility] ED and at that time restarted on meds from 10/8 appointment including Keppra (likely due to being unable to see [facility] medication changes from hospitalizations).   The patient was seen this morning and was minimally responsive when awaken by verbal and gentle tactile stimuli including not opening eyes, making noises without no speech, and turns head toward interviewer. Does not answer orientation questions. Per Designer, television/film set, patient was been this responsiveness for most of morning but has been able to say he was in pain. Chart has been extensively reviewed including interim documentation by primary team and nursing staff has been reviewed. At this time, patient will require inpatient psychiatry and is currently under IVC.  Our recommendations include: Decreasing prozac to 20 mg once daily until mentation improves Continue abilify 5 mg once at bedtime Continue lamotragine 50 mg once daily, will not restart keppra For inpatient psych hospitalization, we will fax out to [facility] per father's recommendations and given in same network as pt's  outpatient Continue IVC and 1:1 monitoring  We will continue to follow and do full consult tomorrow AM when pt is more responsive.   Meryl Dare, MD PGY-1 Psychiatry Resident 07/18/2023, 12:25 PM

## 2023-07-19 ENCOUNTER — Encounter (HOSPITAL_COMMUNITY): Payer: Self-pay

## 2023-07-19 DIAGNOSIS — F411 Generalized anxiety disorder: Secondary | ICD-10-CM

## 2023-07-19 DIAGNOSIS — T1490XA Injury, unspecified, initial encounter: Secondary | ICD-10-CM

## 2023-07-19 DIAGNOSIS — F332 Major depressive disorder, recurrent severe without psychotic features: Secondary | ICD-10-CM | POA: Diagnosis not present

## 2023-07-19 MED ORDER — FLUOXETINE HCL 20 MG PO CAPS
40.0000 mg | ORAL_CAPSULE | Freq: Every day | ORAL | Status: DC
  Administered 2023-07-20: 40 mg via ORAL
  Filled 2023-07-19: qty 2

## 2023-07-19 MED ORDER — CIPROFLOXACIN HCL 500 MG PO TABS
500.0000 mg | ORAL_TABLET | Freq: Two times a day (BID) | ORAL | Status: DC
  Administered 2023-07-19 – 2023-07-20 (×2): 500 mg via ORAL
  Filled 2023-07-19 (×3): qty 1

## 2023-07-19 MED ORDER — TRAZODONE HCL 50 MG PO TABS: 50.0000 mg | ORAL_TABLET | Freq: Every evening | ORAL | Status: DC | PRN
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
## 2023-07-19 NOTE — Plan of Care (Signed)
  Problem: Education: Goal: Knowledge of General Education information will improve Description: Including pain rating scale, medication(s)/side effects and non-pharmacologic comfort measures Outcome: Progressing   Problem: Health Behavior/Discharge Planning: Goal: Ability to manage health-related needs will improve Outcome: Progressing   Problem: Clinical Measurements: Goal: Ability to maintain clinical measurements within normal limits will improve Outcome: Progressing Goal: Will remain free from infection Outcome: Progressing Goal: Diagnostic test results will improve Outcome: Progressing Goal: Respiratory complications will improve Outcome: Progressing Goal: Cardiovascular complication will be avoided Outcome: Progressing   Problem: Activity: Goal: Risk for activity intolerance will decrease Outcome: Progressing   Problem: Nutrition: Goal: Adequate nutrition will be maintained Outcome: Progressing   Problem: Coping: Goal: Level of anxiety will decrease Outcome: Progressing   Problem: Elimination: Goal: Will not experience complications related to bowel motility Outcome: Progressing Goal: Will not experience complications related to urinary retention Outcome: Progressing   Problem: Pain Management: Goal: General experience of comfort will improve Outcome: Progressing   Problem: Skin Integrity: Goal: Risk for impaired skin integrity will decrease Outcome: Progressing
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
## 2023-07-19 NOTE — Progress Notes (Signed)
0800 patient alert able to make all needs known on room air patient has sitter at bedside due to patients suicide attempt. Patient now states he doesn't want to harm himself any longer.

## 2023-07-19 NOTE — Progress Notes (Signed)
Patient is medically stable for discharge at this time. Awaiting psychiatric placement.   Juliet Rude, [facility] 07/19/2023, 1:42 PM Please see Amion for pager number during day hours 7:00am-4:30pm
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
## 2023-07-19 NOTE — TOC Initial Note (Signed)
Transition of Care [facility]) - Initial/Assessment Note    Patient Details  Name: Vincent Frank MRN: [id number] Date of Birth: 07-19-2001  Transition of Care [facility]) CM/SW Contact:    Glennon Mac, RN Phone Number: 07/19/2023, 11:40am  Clinical Narrative:                 Referral received for inpatient psych facility; MD requests fax to [facility], as patient follows up outpatient there.   Faxed referral to [name], Fax # [phone]; Phone: [phone].   Patient was also faxed out to multiple other facilities as backup plan to [facility].    Addendum: 1600pm Called back to [facility]; they state referral currently in review with MD.  Admission coordinator given unit phone number to call after hours for psych bed coordination.  Of note, [facility] is offering a bed, should [facility] decline.  Should [facility] want patient tonight, Charge Nurse made aware to call  [employer] at [phone].    Expected Discharge Plan: Psychiatric Hospital Barriers to Discharge: Continued Medical Work up    Expected Discharge Plan and Services   Discharge Planning Services: CM Consult   Living arrangements for the past 2 months: Single Family Home                                      Prior Living Arrangements/Services Living arrangements for the past 2 months: Single Family Home Lives with:: Siblings, Parents Patient language and need for interpreter reviewed:: Yes Do you feel safe going back to the place where you live?: Yes      Need for Family Participation in Patient Care: Yes (Comment) Care giver support system in place?: Yes (comment)   Criminal Activity/Legal Involvement Pertinent to Current Situation/Hospitalization: No - Comment as needed  Activities of Daily Living   ADL Screening (condition at time of admission) Independently performs ADLs?: Yes (appropriate for developmental age) Is the patient deaf or have difficulty hearing?: No Does the  patient have difficulty seeing, even when wearing glasses/contacts?: No Does the patient have difficulty concentrating, remembering, or making decisions?: No               Emotional Assessment   Attitude/Demeanor/Rapport: Engaged Affect (typically observed): Accepting Orientation: : Oriented to Self, Oriented to Place, Oriented to  Time, Oriented to Situation      Admission diagnosis:  Trauma [T14.90XA] Patient Active Problem List   Diagnosis Date Noted   Trauma 07/17/2023   PCP:  Kandyce Rud, MD Pharmacy:   [facility] [phone] - [place], [place] - [address] [place] [address] Phone: [phone] Fax: [phone]     Social Drivers of Health (SDOH) Social History: SDOH Screenings   Food Insecurity: No Food Insecurity (07/19/2023)  Housing: High Risk (07/19/2023)  Transportation Needs: No Transportation Needs (07/19/2023)  Utilities: Not At Risk (07/19/2023)  Tobacco Use: Low Risk  (07/19/2023)   SDOH Interventions:     Readmission Risk Interventions     No data to display         Quintella Baton, RN, BSN  Trauma/Neuro ICU Case Manager [phone]

## 2023-07-20 ENCOUNTER — Inpatient Hospital Stay (HOSPITAL_COMMUNITY): Payer: BC Managed Care – PPO | Admitting: Anesthesiology

## 2023-07-20 ENCOUNTER — Inpatient Hospital Stay (HOSPITAL_COMMUNITY): Payer: BC Managed Care – PPO

## 2023-07-20 ENCOUNTER — Encounter (HOSPITAL_COMMUNITY): Payer: BC Managed Care – PPO

## 2023-07-20 DIAGNOSIS — F411 Generalized anxiety disorder: Secondary | ICD-10-CM | POA: Diagnosis not present

## 2023-07-20 DIAGNOSIS — F332 Major depressive disorder, recurrent severe without psychotic features: Secondary | ICD-10-CM | POA: Diagnosis not present

## 2023-07-20 DIAGNOSIS — R569 Unspecified convulsions: Secondary | ICD-10-CM | POA: Diagnosis not present

## 2023-07-20 LAB — POCT I-STAT 7, (LYTES, BLD GAS, ICA,H+H)
Acid-base deficit: 2 mmol/L (ref 0.0–2.0)
Bicarbonate: 22.7 mmol/L (ref 20.0–28.0)
Calcium, Ion: 1.24 mmol/L (ref 1.15–1.40)
HCT: 33 % — ABNORMAL LOW (ref 39.0–52.0)
Hemoglobin: 11.2 g/dL — ABNORMAL LOW (ref 13.0–17.0)
O2 Saturation: 98 %
Patient temperature: 98.4
Potassium: 3.7 mmol/L (ref 3.5–5.1)
Sodium: 139 mmol/L (ref 135–145)
TCO2: 24 mmol/L (ref 22–32)
pCO2 arterial: 36.2 mmHg (ref 32–48)
pH, Arterial: 7.404 (ref 7.35–7.45)
pO2, Arterial: 105 mmHg (ref 83–108)

## 2023-07-20 LAB — GLUCOSE, CAPILLARY: Glucose-Capillary: 89 mg/dL (ref 70–99)

## 2023-07-20 LAB — MRSA NEXT GEN BY PCR, NASAL: MRSA by PCR Next Gen: NOT DETECTED

## 2023-07-20 MED ORDER — FENTANYL 2500MCG IN NS 250ML (10MCG/ML) PREMIX INFUSION
50.0000 ug/h | INTRAVENOUS | Status: DC
Start: 2023-07-20 — End: 2023-07-23
  Administered 2023-07-20: 100 ug/h via INTRAVENOUS
  Administered 2023-07-21: 150 ug/h via INTRAVENOUS
  Administered 2023-07-21: 200 ug/h via INTRAVENOUS
  Filled 2023-07-20 (×3): qty 250

## 2023-07-20 MED ORDER — FLUOXETINE HCL 20 MG PO CAPS
60.0000 mg | ORAL_CAPSULE | Freq: Every day | ORAL | Status: DC
Start: 2023-07-21 — End: 2023-07-23
  Administered 2023-07-21 – 2023-07-22 (×2): 60 mg
  Filled 2023-07-20 (×2): qty 3

## 2023-07-20 MED ORDER — FENTANYL CITRATE PF 50 MCG/ML IJ SOSY
50.0000 ug | PREFILLED_SYRINGE | Freq: Once | INTRAMUSCULAR | Status: DC
Start: 2023-07-20 — End: 2023-07-23

## 2023-07-20 MED ORDER — CHLORHEXIDINE GLUCONATE CLOTH 2 % EX PADS
6.0000 | MEDICATED_PAD | Freq: Every day | CUTANEOUS | Status: DC
Start: 2023-07-20 — End: 2023-07-28
  Administered 2023-07-20 – 2023-07-27 (×7): 6 via TOPICAL

## 2023-07-20 MED ORDER — LAMOTRIGINE 25 MG PO TABS
25.0000 mg | ORAL_TABLET | Freq: Two times a day (BID) | ORAL | Status: DC
Start: 2023-07-20 — End: 2023-07-20

## 2023-07-20 MED ORDER — FENTANYL BOLUS VIA INFUSION
50.0000 ug | INTRAVENOUS | Status: DC | PRN
Start: 2023-07-20 — End: 2023-07-23
  Administered 2023-07-21 (×4): 100 ug via INTRAVENOUS
  Administered 2023-07-22: 50 ug via INTRAVENOUS
  Administered 2023-07-22 (×2): 100 ug via INTRAVENOUS
  Administered 2023-07-22: 50 ug via INTRAVENOUS

## 2023-07-20 MED ORDER — LAMOTRIGINE 25 MG PO TABS
25.0000 mg | ORAL_TABLET | Freq: Two times a day (BID) | ORAL | Status: DC
Start: 2023-07-20 — End: 2023-07-23
  Administered 2023-07-20 – 2023-07-22 (×4): 25 mg
  Filled 2023-07-20 (×4): qty 1

## 2023-07-20 MED ORDER — CIPROFLOXACIN HCL 500 MG PO TABS
500.0000 mg | ORAL_TABLET | Freq: Two times a day (BID) | ORAL | Status: DC
  Administered 2023-07-20 – 2023-07-22 (×4): 500 mg
  Filled 2023-07-20 (×7): qty 1

## 2023-07-20 MED ORDER — DOCUSATE SODIUM 50 MG/5ML PO LIQD
100.0000 mg | Freq: Two times a day (BID) | ORAL | Status: DC
  Administered 2023-07-20 – 2023-07-22 (×4): 100 mg
  Filled 2023-07-20 (×4): qty 10

## 2023-07-20 MED ORDER — MIDAZOLAM-SODIUM CHLORIDE 100-0.9 MG/100ML-% IV SOLN
0.0000 mg/h | INTRAVENOUS | Status: DC
  Administered 2023-07-20: 4 mg/h via INTRAVENOUS
  Administered 2023-07-21: 7 mg/h via INTRAVENOUS
  Filled 2023-07-20 (×2): qty 100

## 2023-07-20 MED ORDER — PROPOFOL 1000 MG/100ML IV EMUL: 0.0000 ug/kg/min | INTRAVENOUS | Status: DC

## 2023-07-20 MED ORDER — ACETAMINOPHEN 160 MG/5ML PO SOLN
1000.0000 mg | Freq: Four times a day (QID) | ORAL | Status: DC
  Administered 2023-07-20 – 2023-07-22 (×6): 1000 mg
  Filled 2023-07-20 (×7): qty 40.6

## 2023-07-20 MED ORDER — MIDAZOLAM HCL 2 MG/2ML IJ SOLN
INTRAMUSCULAR | Status: AC
  Filled 2023-07-20: qty 2

## 2023-07-20 MED ORDER — ACETAMINOPHEN 160 MG/5ML PO SOLN: 1000.0000 mg | Freq: Four times a day (QID) | ORAL | Status: DC

## 2023-07-20 MED ORDER — FLUOXETINE HCL 20 MG PO CAPS: 60.0000 mg | ORAL_CAPSULE | Freq: Every day | ORAL | Status: DC

## 2023-07-20 MED ORDER — PROPOFOL 10 MG/ML IV BOLUS
INTRAVENOUS | Status: DC | PRN
  Administered 2023-07-20: 200 mg via INTRAVENOUS

## 2023-07-20 MED ORDER — IBUPROFEN 200 MG PO TABS: 600.0000 mg | ORAL_TABLET | Freq: Four times a day (QID) | ORAL | Status: DC | PRN

## 2023-07-20 MED ORDER — SUCCINYLCHOLINE CHLORIDE 200 MG/10ML IV SOSY
PREFILLED_SYRINGE | INTRAVENOUS | Status: DC | PRN
  Administered 2023-07-20: 160 mg via INTRAVENOUS

## 2023-07-20 MED ORDER — PROPOFOL 1000 MG/100ML IV EMUL
5.0000 ug/kg/min | INTRAVENOUS | Status: DC
  Administered 2023-07-20: 30 ug/kg/min via INTRAVENOUS
  Administered 2023-07-20: 80 ug/kg/min via INTRAVENOUS
  Filled 2023-07-20: qty 100

## 2023-07-20 MED ORDER — ORAL CARE MOUTH RINSE: 15.0000 mL | OROMUCOSAL | Status: DC | PRN

## 2023-07-20 MED ORDER — LAMOTRIGINE 25 MG PO TABS: 50.0000 mg | ORAL_TABLET | Freq: Every day | ORAL | Status: DC

## 2023-07-20 MED ORDER — PROPOFOL 1000 MG/100ML IV EMUL
INTRAVENOUS | Status: AC
  Filled 2023-07-20: qty 100

## 2023-07-20 MED ORDER — MIDAZOLAM HCL 2 MG/2ML IJ SOLN
1.0000 mg | INTRAMUSCULAR | Status: DC | PRN
  Administered 2023-07-20: 2 mg via INTRAVENOUS

## 2023-07-20 MED ORDER — TRAZODONE HCL 50 MG PO TABS: 50.0000 mg | ORAL_TABLET | Freq: Every evening | ORAL | Status: DC | PRN

## 2023-07-20 MED ORDER — ORAL CARE MOUTH RINSE
15.0000 mL | OROMUCOSAL | Status: DC
  Administered 2023-07-20 – 2023-07-22 (×22): 15 mL via OROMUCOSAL

## 2023-07-20 MED ORDER — SODIUM CHLORIDE 0.9 % IV SOLN
2000.0000 mg | Freq: Once | INTRAVENOUS | Status: AC
  Administered 2023-07-20: 2000 mg via INTRAVENOUS
  Filled 2023-07-20: qty 20

## 2023-07-20 MED ORDER — POLYETHYLENE GLYCOL 3350 17 G PO PACK
17.0000 g | PACK | Freq: Every day | ORAL | Status: DC
  Administered 2023-07-21 – 2023-07-22 (×2): 17 g
  Filled 2023-07-20 (×3): qty 1

## 2023-07-20 MED ORDER — MIDAZOLAM BOLUS VIA INFUSION: 0.0000 mg | INTRAVENOUS | Status: DC | PRN

## 2023-07-20 MED ORDER — POLYETHYLENE GLYCOL 3350 17 G PO PACK: 17.0000 g | PACK | Freq: Every day | ORAL | Status: DC | PRN

## 2023-07-20 MED ORDER — OXYBUTYNIN CHLORIDE 5 MG PO TABS
5.0000 mg | ORAL_TABLET | Freq: Three times a day (TID) | ORAL | Status: DC
  Administered 2023-07-20 – 2023-07-22 (×6): 5 mg
  Filled 2023-07-20 (×9): qty 1

## 2023-07-20 MED ORDER — HYDROXYZINE HCL 25 MG PO TABS: 25.0000 mg | ORAL_TABLET | Freq: Three times a day (TID) | ORAL | Status: DC | PRN

## 2023-07-20 NOTE — TOC Progression Note (Addendum)
Transition of Care [facility]) - Progression Note    Patient Details  Name: Vincent Frank MRN: [id number] Date of Birth: Jul 24, 2001  Transition of [facility]) CM/SW Contact  Eduard Roux, [place] Phone Number: 07/20/2023, 11:50 AM  Clinical Narrative:     CSW met with patient at bedside. Introduced self and explained role. Patient informed of bed placement at [facility]. Informed he will be transported [facility]  department. Patient states understanding.   CSW contacted [facility]/Intake @ [phone], they confirmed bed availability.  Informed RN report Number # [phone], select option 2, you will leave your direct contact number, name and the patient's name call will be placed in a queue. CSW was told to send d/c information with the patient.  RN and medical team updated.  Antony Blackbird, MSW, LCSW Clinical Social Worker       Expected Discharge Plan: Psychiatric Hospital Barriers to Discharge: Continued Medical Work up  Expected Discharge Plan and Services   Discharge Planning Services: CM Consult   Living arrangements for the past 2 months: Single Family Home                                       Social Determinants of Health (SDOH) Interventions SDOH Screenings   Food Insecurity: No Food Insecurity (07/19/2023)  Housing: High Risk (07/19/2023)  Transportation Needs: No Transportation Needs (07/19/2023)  Utilities: Not At Risk (07/19/2023)  Tobacco Use: Low Risk  (07/19/2023)    Readmission Risk Interventions     No data to display

## 2023-07-20 NOTE — Progress Notes (Signed)
LTM EEG running - no initial skin breakdown - push button tested - neuro notified.  

## 2023-07-20 NOTE — Progress Notes (Addendum)
Was accepted to psych facility   Notified by nursing that he had a witnessed fall in the bathroom and struck his head on floor. Noted to be unresponsive following and was transferred to his bed with a lift device. Unresponsive on my arrival with GCS 3 ~10 minutes beyond fall; airway secured with anesthesia.   CT head, cspine Cspine precautions until cspine cleared CXR OG tube to [facility] ICU transfer I have consulted neurology Dr. Otelia Limes based on his apparent neurologic history and possibility of underlying seizure activity; recommended he get keppra load 2000 mg.  CRITICAL CARE Performed by: Andria Meuse   Total critical care time: 40 minutes  Critical care time was exclusive of separately billable procedures and treating other patients.  Critical care was necessary to treat or prevent imminent or life-threatening deterioration.  Critical care was time spent personally by me on the following activities: development of treatment plan with patient and/or surrogate as well as nursing, discussions with consultants, evaluation of patient's response to treatment, examination of patient, obtaining history from patient or surrogate, ordering and performing treatments and interventions, ordering and review of laboratory studies, ordering and review of radiographic studies, pulse oximetry and re-evaluation of patient's condition.  Marin Olp, MD [facility]

## 2023-07-20 NOTE — Progress Notes (Addendum)
0006-Patient c/o SOB. Patient is currently sating at 94% RR at 18. RN placed 2L [place] and sat patient upright with HOB at 30 degrees. Patient now sating at 96-100% RR 22. Patient stated it is not related to anxiety and refused any anxiety medication at time.  RN paged on call trauma MD Metzger.  0023-No further orders at this time   0500-Patient now stated he feels better and no longer has SOB.  RN removed 2L [place]. Pt sating at 96% and RR 20.
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
## 2023-07-20 NOTE — Consult Note (Addendum)
NEUROLOGY CONSULT NOTE   Date of service: July 20, 2023 Patient Name: Vincent Frank MRN:  [id number] DOB:  Aug 29, 2000 Chief Complaint: "unresponsive" Requesting Provider: Md, Trauma, MD  History of Present Illness  Vincent Frank is a 22 y.o. male with a past medical history of previous SI, MDD, GAD, PNES, and urinary retention presenting after a fall. He was admitted on12/21 after a jump from a bridge.  He was set to be discharged today to [facility].  He was washing his hands when he became unresponsive, fell, hit his head in the presence of his sitter. Sitter told RN that it initially looked like he lost his balance.  Per RN at the time of his fall he did try to get up but seemed disoriented. The sitter told him to stay on the ground until she could get help. She stepped to the door to call for assistance and when she came back to him he was unresponsive. Trauma RN went to the bedside and noted eyelid fluttering when they were getting him back into the bed, but she could not get a response when she tried sternal rubbing him or using noxious stimuli. Bedside RN denied tongue bite, incontinence, shaking or seizure like activity. Bedside RN states that today he has had to use an in and out cath which is abnormal for him.  She additionally states that he wanted to go to [facility] but was not accepted.  He had been upset about this prior to the above event.  After the fall the trauma team was called and he was intubated by anesthesia due to a GCS of 3.  He was subsequently transferred to the ICU. At this time he is requiring propofol, fentanyl, and versed for sedation due to agitation. With sedation turned down he is able to follow commands. LTM is in place.   He was seen in November by neurology (MRN [id number]) for seizure-like activity. 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 He is currently on lamotrigine 50  mg as of 12/21. He is also currently on fluoxetine 60 daily, abilify 5 daily, trazodone 50 qhs prn, hydroxyzine 25 tid prn.     ROS   Unable to ascertain due to being intubated, sedated  Past History  History reviewed. No pertinent past medical history.  History reviewed. No pertinent surgical history.  Family History: History reviewed. No pertinent family history.  Social History  reports that he has never smoked. He has never used smokeless tobacco. He reports that he does not use drugs. No history on file for alcohol use.  Allergies  Allergen Reactions   Bee Venom Anaphylaxis   Haldol [Haloperidol] Anaphylaxis and Other (See Comments)    Dystonic reaction with muscle contractures   Hornet Venom Anaphylaxis   Peanut (Diagnostic) Anaphylaxis   Wasp Venom Anaphylaxis    Medications   Current Facility-Administered Medications:    acetaminophen (TYLENOL) tablet 1,000 mg, 1,000 mg, Oral, Q6H, Kinsinger, De Blanch, MD, 1,000 mg at 07/20/23 1111   ARIPiprazole (ABILIFY) tablet 5 mg, 5 mg, Per Tube, Daily, Kinsinger, De Blanch, MD, 5 mg at 07/20/23 0936   ciprofloxacin (CIPRO) tablet 500 mg, 500 mg, Oral, BID, Romero Letizia Form, PA-C, 500 mg at 07/20/23 7829   docusate sodium (COLACE) capsule 100 mg, 100 mg, Oral, BID, Jimi Giza Form, PA-C, 100 mg at 07/20/23 0932   enoxaparin (LOVENOX) injection 30 mg, 30 mg, Subcutaneous, Q12H, Kinsinger, De Blanch, MD,  30 mg at 07/20/23 0933   [START ON 07/21/2023] FLUoxetine (PROZAC) capsule 60 mg, 60 mg, Oral, Daily, Meryl Dare, MD   hydrALAZINE (APRESOLINE) injection 10 mg, 10 mg, Intravenous, Q2H PRN, Kinsinger, De Blanch, MD   hydrOXYzine (ATARAX) tablet 25 mg, 25 mg, Oral, TID PRN, Edan Juday Form, PA-C   ibuprofen (ADVIL) tablet 600 mg, 600 mg, Oral, Q6H PRN, Murl Golladay Form, PA-C, 600 mg at 07/19/23 2106   lamoTRIgine (LAMICTAL) tablet 50 mg, 50 mg, Per Tube, QHS, Kinsinger, De Blanch, MD, 50 mg at 07/19/23 2106    metoprolol tartrate (LOPRESSOR) injection 5 mg, 5 mg, Intravenous, Q6H PRN, Kinsinger, De Blanch, MD   ondansetron (ZOFRAN-ODT) disintegrating tablet 4 mg, 4 mg, Oral, Q6H PRN **OR** ondansetron (ZOFRAN) injection 4 mg, 4 mg, Intravenous, Q6H PRN, Kinsinger, De Blanch, MD   oxybutynin (DITROPAN) tablet 5 mg, 5 mg, Oral, TID, Kabrich, Martha H, PA-C, 5 mg at 07/20/23 0981   polyethylene glycol (MIRALAX / GLYCOLAX) packet 17 g, 17 g, Oral, Daily PRN, Nickalus Thornsberry Form, PA-C, 17 g at 07/20/23 1111   propofol (DIPRIVAN) 1000 MG/100ML infusion, , , ,    tamsulosin (FLOMAX) capsule 0.4 mg, 0.4 mg, Oral, BID, Kabrich, Martha H, PA-C, 0.4 mg at 07/20/23 0933   traZODone (DESYREL) tablet 50 mg, 50 mg, Oral, QHS PRN, Meryl Dare, MD  Vitals   Vitals:   07/20/23 1436 07/20/23 1439 07/20/23 1442 07/20/23 1446  BP: 133/67 129/65 130/85 (!) 126/55  Pulse: 80  71 (!) 106  Resp:    (!) 21  Temp:      TempSrc:      SpO2: 98%  100% 100%  Weight:      Height:        Body mass index is 29.52 kg/m.  Physical Exam   Constitutional: Appears well-developed and well-nourished.  Psych: Sedated Eyes: No scleral injection.  HENT: No OP obstruction.  Head: Normocephalic.  Cardiovascular: Normal rate and regular rhythm.  Respiratory: Mechanically ventilated GI: Soft.  No distension. There is no tenderness.  Skin: WDI.   Neurologic Examination   Neuro: Mental Status: Intubated, sedated, follows commands with sedation turned down.  With sedation turned down he additionally becomes agitated - sedation  then increased back to propofol at 60, fentanyl 100, Versed at 6 Cranial Nerves: II: Pupils are equal, round, and reactive to light.   III,IV, VI: Eyes midline VII: Facial movement is symmetric resting and smiling VIII: Hearing is intact to voice X: Cough and gag intact XI: Head is midline XII: Obstructed by ET tube, no tongue bite noted Motor/sensory: Tone is normal. Bulk is normal.  Localizes  to noxious stimuli in all extremities. Seems to have good and equal strength in all extremities  Cerebellar: Unable to test Reflexes: 2+ bilateral patellae under deep sedation. 3-4 beats of clonus with forced dorsiflexion of each foot.    Labs/Imaging/Neurodiagnostic studies   CBC:  Recent Labs  Lab 07/27/2023 0610 07-27-2023 1021  WBC 7.0 6.8  HGB 9.7* 11.1*  HCT 31.0* 34.9*  MCV 77.5* 76.7*  PLT 177 207   Basic Metabolic Panel:  Lab Results  Component Value Date   NA 141 2023-07-27   K 3.7 Jul 27, 2023   CO2 24 07-27-2023   GLUCOSE 92 07-27-2023   BUN 8 Jul 27, 2023   CREATININE 0.94 07/27/23   CALCIUM 8.7 (L) 2023-07-27   GFRNONAA >60 July 27, 2023   Lipid Panel: No results found for: "LDLCALC" HgbA1c: No results found for: "HGBA1C" Urine Drug  Screen:     Component Value Date/Time   LABOPIA NONE DETECTED 07/17/2023 1553   COCAINSCRNUR NONE DETECTED 07/17/2023 1553   LABBENZ POSITIVE (A) 07/17/2023 1553   AMPHETMU NONE DETECTED 07/17/2023 1553   THCU NONE DETECTED 07/17/2023 1553   LABBARB NONE DETECTED 07/17/2023 1553    Alcohol Level     Component Value Date/Time   ETH <10 07/17/2023 1523   INR  Lab Results  Component Value Date   INR 1.2 07/17/2023   CT Head and C-spine: 1. No acute intracranial pathology. 2. No acute/traumatic cervical spine pathology.  ASSESSMENT  Vincent Frank is a 22 y.o. male with a past medical history of previous SI, MDD, GAD, PNES, and urinary retention presenting after a fall. He was admitted 12/21 after a jump from a bridge, he was set to be discharged today to [facility]. He was washing his hands when he became unresponsive, fell, hit his head in the presence of his sitter. Reported GCS of 3, intubated by anesthesia and transferred to ICU. At this time he is requiring propofol, fentanyl, and versed for sedation.  LTM is in place.  Nursing is decreasing the propofol as they are able.  He is still requiring Versed and fentanyl  as well for agitation.  Trauma team wants to keep him intubated overnight and will attempt extubation tomorrow morning. He was loaded with Keppra 2000 mg. Home AED regimen consists of Keppra 500 mg BID and Lamictal 25 mg at bedtime.  - Exam as above is limited due to sedation, but no lateralized deficit noted.  - CT head and neck are negative.  - LTM EEG preliminary review reveals alpha waves at 9 Hz gradually changing to slightly higher frequencies over the 55 minute recording. No electrographic seizures are seen - Suspect possible PNES event tonight with the eyelid fluttering and unresponsiveness prior to intubation. Seizure is possible a well, which will be assessed with EEG.  - Per Psychiatry, they request if there is any way we can switch him to an alternative AED as he's had over 10 suicide attempts in a relatively short period of time and they are concerned Keppra might be contributing due to possible side effect of agitation/aggressive behavior. Will discontinue Keppra and continue on increased Lamictal dose totaling 50 mg per day, divided in two doses.     RECOMMENDATIONS  - STAT LTM EEG (ordered) - Wean sedation and ventilator as able - Continue lamotrigine but change 50 mg per tube at bedtime to 25 mg per tube BID - Discontinue Keppra ______________________________________________________________________    Dessa Phi, Randi Poullard, MD [facility]

## 2023-07-20 NOTE — Progress Notes (Addendum)
Trauma Event Note    Reason for Call : Pt unresponsive/obtunded on bathroom floor.  Pulses present, VS WDL. GCS 3.  Initial Focused Assessment: - AVPU: Pt unresponsive. Breathing simultaneously, pulses present.  - Airway: intact, patent, no signs of airway obstruction.  - Breathing: Breath sounds clear and equal bilaterally. O2 sat: 100% on RA.  - Circulation: Pulses intact centrally and peripherally. No signs of external hemorrhage.  - Disability: GCS 3; obtunded and unresponsive to any noxious stimuli (including extensive sternal rubbing) PERRLA.  - VS WDL on RA.  CBG WDL.   Interventions: -Noxious stimuli endorsed via sternal rub and trapezius squeezing in attempt to wake pt.  -Vital signs checked including CBG: all WDL -Log rolled pt in order to place hoyer lift pad underneath him to safely get him back to bed.  -Called Dr Cliffton Asters to bedside -Michiel Sites lifted pt back to bed.  -Anesthesia called to bedside, pt intubated with RSI meds (see below and see MAR) -OG tube placed and on LWS  -18G PIV placed to R hand (second point of access)  -CBC and BMP drawn -CT head and c-spine obtained -CXR placed and obtained.  -Transferred to 4N25 in the ICU. -Notified Neurology -2g keppra load ordered and given -Spoke with neurology who is reaching out to psych.  -LTM EEG -Foley catheter placed due to pt's hx of I/O cath.  Plan of Care: Per Dr Cliffton Asters, keep pt sedated and intubated tonight for seizure/neurology workup.  Overnight EEG ordered. Psych being notified.    Event Summary: Pt was standing in the bathroom at the sink when he had a witnessed fall.  [name] sitter was standing there and saw him "lose his footing" and fall backwards, striking his head on the floor.  Pt was responsive initially and touched his head and then attempted to get up on his own.  At this time, the pt's sitter told him not to move and went to get the RN to help get him up.  When the sitter and RN came back, the patient  was completely unresponsive, laying on the bathroom floor.  Pulses intact. VS WDL.  Alan Ripper, RN, called TRN to bedside.  Upon my arrival, pt was obtunded and not responding to any noxious stimuli.  TRN called Dr Cliffton Asters and he proceeded to come to bedside.  At this time, hoyer lift had been brought to pt's room and we proceeded to logroll the patient to get pad underneath him and lifted him back to the bed.  Pt remained unresponsive throughout.   After pt was back in the bed he started having what seemed to be "eye fluttering" and was still not responding to anything.  Dr Cliffton Asters at bedside and stated that he now needs to be intubated due to his low GCS.  Anesthesia was then called to the bedside, 200mg  of propofol and 160mg  of succinylcholine were given and pt was intubated. Pt started to gag and vomited after paralytic was worn off. Mouth was suctioned with Yankauer;  TRN placed an OG tube and immediately hooked it to low wall suction. Approximately of bile colored fluid was suctioned out of OG tube.  [place] C-collar was applied due to the fall and restraints were also applied due to pt starting to have some purposeful movement to his LUE. (Pt attempted to reach at his tube). TRN placed second PIV to L hand and drew CBC and BMP.  Propofol gtt initiated and 2mg  of versed was given at this time.  Pt was then taken to CT for a CT of his head and C-spine with the TRN, RT and pt's sitter.  Pt was then taken to 4N25 where Amy, RN took over care. Pt very purposeful at this time and consistently following commands.  Fentanyl and versed gtts added at this time and Dr Cliffton Asters was notified.  Neurology on their way to see the patient as well (pt has a hx of seizures as well as a psych hx).    MD Notified: Dr Cliffton Asters Call Time: 1423 Arrival Time: 1428 End Time: 1545  Last imported Vital Signs BP 118/66   Pulse 60   Temp 98.4 F (36.9 C) (Axillary)   Resp 18   Ht 6\' 4"  (1.93 m)   Wt 242 lb 8.1 oz (110 kg)    SpO2 100%   BMI 29.52 kg/m   Trending CBC Recent Labs    07/18/23 0610 07/18/23 1021 07/20/23 1615  WBC 7.0 6.8  --   HGB 9.7* 11.1* 11.2*  HCT 31.0* 34.9* 33.0*  PLT 177 207  --     Trending Coag's No results for input(s): "APTT", "INR" in the last 72 hours.  Trending BMET Recent Labs    07/18/23 0610 07/18/23 1021 07/20/23 1615  NA 141  --  139  K 3.7  --  3.7  CL 110  --   --   CO2 24  --   --   BUN 8  --   --   CREATININE 1.11 0.94  --   GLUCOSE 92  --   --     Gordy Levan, Emmalea Treanor W  Trauma Response RN  Please call TRN at [phone] for further assistance.

## 2023-07-20 NOTE — Progress Notes (Signed)
Pt transported to and from CT without event.  

## 2023-07-20 NOTE — Progress Notes (Signed)
Called to room by [facility] sitter that pt while washing hands in bathroom and hit his head. Per sitter he was initially talking and attempting to get up but seconds later became unresponsive. Per sitter, observed fall while she was in the bathroom with him. This RN at patient side continued to have pulse and VSS but not responding to painful stimulus. No external injuries noted on assessment. PERRL. TRN Katy & Dr. Cliffton Asters called to bedside. Pt transferred back to bed with hoyer lift, C collar applied. Pt continued to be unresponsive with a GCS of 3. Vitals continue to be WNL. Per Dr. Cliffton Asters, new orders for intubation, CT scan, and transfer to ICU. Report given to Amy RN. All questions answered.  Transported to CT by RT and TRN at bedside.

## 2023-07-20 NOTE — TOC Progression Note (Signed)
Transition of Care [facility]) - Progression Note    Patient Details  Name: Kymere Kliewer MRN: [id number] Date of Birth: 26-Sep-2000  Transition of [facility] CM/SW Contact  Eduard Roux, [place] Phone Number: 07/20/2023, 2:13 PM  Clinical Narrative:     [facility].   Medical team updated- [facility] leadership informed.  TOC will continue to follow and assist with discharge planning.   Antony Blackbird, MSW, LCSW Clinical Social Worker    Expected Discharge Plan: [facility] Barriers to Discharge: Continued Medical Work up  Expected Discharge Plan and Services   Discharge Planning Services: CM Consult   Living arrangements for the past 2 months: Single Family Home Expected Discharge Date: 07/20/23                                     Social Determinants of Health (SDOH) Interventions SDOH Screenings   Food Insecurity: No Food Insecurity (07/19/2023)  Housing: High Risk (07/19/2023)  Transportation Needs: No Transportation Needs (07/19/2023)  Utilities: Not At Risk (07/19/2023)  Tobacco Use: Low Risk  (07/19/2023)    Readmission Risk Interventions     No data to display

## 2023-07-20 NOTE — Anesthesia Procedure Notes (Signed)
Procedure Name: Intubation Date/Time: 07/20/2023 2:42 PM  Performed by: Val Eagle, MDPre-anesthesia Checklist: Patient identified, Emergency Drugs available, Suction available and Patient being monitored Patient Re-evaluated:Patient Re-evaluated prior to induction Oxygen Delivery Method: Circle system utilized Preoxygenation: Pre-oxygenation with 100% oxygen Induction Type: IV induction, Rapid sequence and Cricoid Pressure applied Laryngoscope Size: Glidescope Grade View: Grade I Tube type: Oral Tube size: 8.0 mm Number of attempts: 1 Airway Equipment and Method: Stylet and Oral airway Placement Confirmation: ETT inserted through vocal cords under direct vision, positive ETCO2 and breath sounds checked- equal and bilateral Secured at: 22 cm Tube secured with: Tape Dental Injury: Teeth and Oropharynx as per pre-operative assessment

## 2023-07-21 DIAGNOSIS — F411 Generalized anxiety disorder: Secondary | ICD-10-CM | POA: Diagnosis not present

## 2023-07-21 DIAGNOSIS — F332 Major depressive disorder, recurrent severe without psychotic features: Secondary | ICD-10-CM | POA: Diagnosis not present

## 2023-07-21 DIAGNOSIS — F445 Conversion disorder with seizures or convulsions: Secondary | ICD-10-CM | POA: Diagnosis not present

## 2023-07-21 DIAGNOSIS — R569 Unspecified convulsions: Secondary | ICD-10-CM

## 2023-07-21 LAB — TRIGLYCERIDES: Triglycerides: 133 mg/dL (ref <150)

## 2023-07-21 MED ORDER — DEXMEDETOMIDINE HCL IN NACL 400 MCG/100ML IV SOLN
0.0000 ug/kg/h | INTRAVENOUS | Status: DC
Start: 2023-07-21 — End: 2023-07-23
  Administered 2023-07-21 (×2): 0.4 ug/kg/h via INTRAVENOUS
  Filled 2023-07-21 (×2): qty 100

## 2023-07-21 MED ORDER — KCL IN DEXTROSE-NACL 20-5-0.45 MEQ/L-%-% IV SOLN
INTRAVENOUS | Status: AC
Start: 2023-07-21 — End: 2023-07-22
  Filled 2023-07-21 (×4): qty 1000

## 2023-07-21 NOTE — Procedures (Signed)
Patient Name: Vincent Frank  MRN: [id number]  Epilepsy Attending: Charlsie Quest  Referring Physician/Provider: Caryl Pina, MD  Duration: 07/20/2023 1708 to 07/21/2023 1823  Patient history: 22 y.o. male with a past medical history of previous SI, MDD, GAD, PNES, and urinary retention presenting after a fall. He was admitted 12/21 after a jump from a bridge, he was set to be discharged today to [facility]. He was washing his hands when he became unresponsive, fell, hit his head in the presence of his sitter. Reported GCS of 3, intubated by anesthesia and transferred to ICU. At this time he is requiring propofol, fentanyl, and versed for sedation. EEG to evaluate for seizure  Level of alertness: comatose-->awake, asleep  AEDs during EEG study: LTG, versed  Technical aspects: This EEG study was done with scalp electrodes positioned according to the 10-20 International system of electrode placement. Electrical activity was reviewed with band pass filter of 1-70Hz , sensitivity of 7 uV/mm, display speed of 71mm/sec with a 60Hz  notched filter applied as appropriate. EEG data were recorded continuously and digitally stored.  Video monitoring was available and reviewed as appropriate.  Description: EEG initially showed continuous generalized 3 to 6 Hz theta-delta slowing admixed with an excessive amount of 15 to 18 Hz beta activity distributed symmetrically and diffusely.Gradually EEG improved and showed posterior dominant rhythm of 8Hz  activity of moderate voltage (25-35 uV) seen predominantly in posterior head regions, symmetric and reactive to eye opening and eye closing. Sleep was characterized by vertex waves, sleep spindles (12 to 14 Hz), maximal frontocentral region. Hyperventilation and photic stimulation were not performed.     ABNORMALITY - Continuous slow, generalized - Excessive beta, generalized  IMPRESSION: This study was initially suggestive of severe diffuse encephalopathy,   likely related to sedation. Gradually EEG improved and was within normal limits. No seizures or epileptiform discharges were seen throughout the recording.  Eyob Godlewski Annabelle Harman

## 2023-07-21 NOTE — Plan of Care (Signed)
Overnight on-call neurology note  Reported by the RN of patient having seizure-like activity consistent with psychogenic appearing spell per the RN.  Very quickly came to and has had no postictal state.  RN started Versed with the concern that this might be a true seizure.  Upon speaking with me, I recommended that Versed be stopped and plans for extubation be as they were. No new orders for right now.    -- Milon Dikes, MD Neurologist [facility]

## 2023-07-21 NOTE — Progress Notes (Signed)
Trauma MD Metzger notified of seizure like activity. MD came up to floor. No new orders. Neuro MD paged. Neuro MD returned page around 2040. No new orders; told to notify him if seizures lasts longer than 5 minutes.
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
## 2023-07-21 NOTE — Care Plan (Signed)
I was notified that the patient had experienced seizure-like activity twice this evening, once as witnessed by the patient's mother and another time by the patient's nurse.  The spells reportedly lasted a few seconds and shortly after the patient was responsive and texting on his phone. He remained HDS throughout.  - Neurology was re-engaged and recommended stopping sedation and working toward extubation - Will plan to extubate in the AM if he remains clinically appropriate  Melody Haver, MD   General Surgeon [facility], [place]

## 2023-07-21 NOTE — Progress Notes (Signed)
TRN on unit. This RN spoke with TRN, who spoke with MD Hillery Hunter about extubation tonight, given that patient can be pretty alert, is communicative with gestures and through typing on phones. Per TRN, MD wishes to hold of on extubation tonight.
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
## 2023-07-21 NOTE — Progress Notes (Signed)
LTM EEG disconnected - no skin breakdown at unhook.  
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
## 2023-07-22 DIAGNOSIS — F445 Conversion disorder with seizures or convulsions: Secondary | ICD-10-CM | POA: Diagnosis not present

## 2023-07-22 DIAGNOSIS — W19XXXA Unspecified fall, initial encounter: Secondary | ICD-10-CM | POA: Diagnosis not present

## 2023-07-22 DIAGNOSIS — F411 Generalized anxiety disorder: Secondary | ICD-10-CM | POA: Diagnosis not present

## 2023-07-22 DIAGNOSIS — F332 Major depressive disorder, recurrent severe without psychotic features: Secondary | ICD-10-CM | POA: Diagnosis not present

## 2023-07-22 LAB — GLUCOSE, CAPILLARY: Glucose-Capillary: 86 mg/dL (ref 70–99)

## 2023-07-22 MED ORDER — SODIUM CHLORIDE 0.9 % IV BOLUS
1000.0000 mL | Freq: Once | INTRAVENOUS | Status: AC
  Administered 2023-07-22: 1000 mL via INTRAVENOUS

## 2023-07-22 MED ORDER — ARIPIPRAZOLE 10 MG PO TABS
10.0000 mg | ORAL_TABLET | Freq: Every day | ORAL | Status: DC
  Filled 2023-07-22: qty 1

## 2023-07-22 MED ORDER — ORAL CARE MOUTH RINSE: 15.0000 mL | OROMUCOSAL | Status: DC | PRN
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
## 2023-07-22 NOTE — Progress Notes (Signed)
Dr. Hillery Hunter paged and made aware of patient's low blood pressure, at time of call 94/48, MAP of 62. MD aware sedation has been titrated down. MD aware fluid bolus for low urine output currently infusing. No new orders at this time, per MD let fluid bolus finish.

## 2023-07-22 NOTE — Progress Notes (Signed)
Approx 2130 pt was asked if he could take his night time meds, Pt shook his head, RN tried to talk to pt saying that we are here to help him at this time pt began holding his breath and shoving his head into the pillow of the bed dropping his Saturation lowest of 73% at this time other staff was called into to assist getting patient off of his stomach to assist with breathing; Pt then became combative kicking and attempting to swing at staff;

## 2023-07-22 NOTE — Progress Notes (Signed)
Low urine output reported to TRN. Patient bladder scanned, showing 0 mL. TRN stated she would contact trauma MD. New orders for a 1000 mL normal saline bolus.
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
## 2023-07-22 NOTE — TOC Progression Note (Signed)
Transition of Care [facility]) - Progression Note    Patient Details  Name: Vincent Frank MRN: [id number] Date of Birth: Apr 30, 2001  [facility] CM/SW Contact  Glennon Mac, RN Phone Number: 07/22/2023, 3:44 PM  Clinical Narrative:    Patient extubated this morning; currently not stable for inpatient psychiatric hospital admission.  Will follow for medical stability; will revisit [facility] as patient followed outpatient there.    Expected Discharge Plan: Psychiatric Hospital Barriers to Discharge: Continued Medical Work up  Expected Discharge Plan and Services   Discharge Planning Services: CM Consult   Living arrangements for the past 2 months: Single Family Home Expected Discharge Date: 07/20/23                                     Social Determinants of Health (SDOH) Interventions SDOH Screenings   Food Insecurity: No Food Insecurity (07/19/2023)  Housing: High Risk (07/19/2023)  Transportation Needs: No Transportation Needs (07/19/2023)  Utilities: Not At Risk (07/19/2023)  Tobacco Use: Low Risk  (07/19/2023)    Readmission Risk Interventions     No data to display         Quintella Baton, RN, BSN  Trauma/Neuro ICU Case Manager [phone]
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
## 2023-07-22 NOTE — Procedures (Signed)
Extubation Procedure Note  Patient Details:   Name: Jonmichael Beltrame DOB: 12-30-2000 MRN: [id number]   Airway Documentation:    Vent end date: 07/22/23 Vent end time: 1138   Evaluation  O2 sats: stable throughout Complications: No apparent complications Patient did tolerate procedure well. Bilateral Breath Sounds: Clear, Diminished   No  Patient extubated per order to 3L [place] with no apparent complications. Positive cuff leak was noted prior to extubation. Patient is alert, has strong cough, and is refusing to speak. Vitals are stable.   Idelle Leech 07/22/2023, 11:39 AM

## 2023-07-22 NOTE — Progress Notes (Signed)
Vincent Frank was awake, alert and following commands upon shift change this morning. He was able to communicate via "texting" to have his needs met, acting appropriately. Vincent Frank expressed that he did not wish his parents to receive full medical updates at this time. Around 0720 became agitated with Mom at bedside and asked for her to leave. Mom left and Vincent Frank expressed that he was "scared to be extubated" due to poor experiences in the past of requiring reintubation. [id number] Vincent Frank was switched to PSV/CPAP, at this time he began shaking and stopped following commands and appeared to be holding his breath. All sedation d/c at this time. He appeared to be looking at providers and this RN when he thought we were not paying attention, fluttering eyes and avoiding eye contact. Providers made aware and assessed Vincent Frank when they rounded.  No changes at this time.

## 2023-07-23 DIAGNOSIS — F411 Generalized anxiety disorder: Secondary | ICD-10-CM | POA: Diagnosis not present

## 2023-07-23 DIAGNOSIS — F332 Major depressive disorder, recurrent severe without psychotic features: Secondary | ICD-10-CM | POA: Diagnosis not present

## 2023-07-23 MED ORDER — LAMOTRIGINE 25 MG PO TABS
25.0000 mg | ORAL_TABLET | Freq: Two times a day (BID) | ORAL | Status: DC
  Administered 2023-07-24 – 2023-08-02 (×19): 25 mg via ORAL
  Filled 2023-07-23 (×18): qty 1

## 2023-07-23 MED ORDER — DOCUSATE SODIUM 50 MG/5ML PO LIQD
100.0000 mg | Freq: Two times a day (BID) | ORAL | Status: DC
  Administered 2023-07-24 – 2023-07-26 (×5): 100 mg via ORAL
  Filled 2023-07-23 (×5): qty 10

## 2023-07-23 MED ORDER — CIPROFLOXACIN HCL 500 MG PO TABS
500.0000 mg | ORAL_TABLET | Freq: Two times a day (BID) | ORAL | Status: DC
  Administered 2023-07-24 – 2023-08-04 (×22): 500 mg via ORAL
  Filled 2023-07-23 (×27): qty 1

## 2023-07-23 MED ORDER — ARIPIPRAZOLE 10 MG PO TABS
10.0000 mg | ORAL_TABLET | Freq: Every day | ORAL | Status: DC
  Filled 2023-07-23: qty 1

## 2023-07-23 MED ORDER — LORAZEPAM 2 MG/ML IJ SOLN
1.0000 mg | Freq: Once | INTRAMUSCULAR | Status: AC | PRN
  Administered 2023-07-24: 1 mg via INTRAVENOUS
  Filled 2023-07-23: qty 1

## 2023-07-23 MED ORDER — ARIPIPRAZOLE 5 MG PO TABS
15.0000 mg | ORAL_TABLET | Freq: Every day | ORAL | Status: DC
  Administered 2023-07-25 – 2023-08-04 (×12): 15 mg via ORAL
  Filled 2023-07-23 (×12): qty 1

## 2023-07-23 MED ORDER — OLANZAPINE 5 MG PO TBDP
10.0000 mg | ORAL_TABLET | Freq: Three times a day (TID) | ORAL | Status: DC | PRN
  Administered 2023-07-24 – 2023-07-28 (×3): 10 mg via ORAL
  Filled 2023-07-23 (×3): qty 2

## 2023-07-23 MED ORDER — POLYETHYLENE GLYCOL 3350 17 G PO PACK: 17.0000 g | PACK | Freq: Every day | ORAL | Status: DC | PRN

## 2023-07-23 MED ORDER — POLYETHYLENE GLYCOL 3350 17 G PO PACK: 17.0000 g | PACK | Freq: Every day | ORAL | Status: DC

## 2023-07-23 MED ORDER — IBUPROFEN 200 MG PO TABS: 600.0000 mg | ORAL_TABLET | Freq: Four times a day (QID) | ORAL | Status: DC | PRN

## 2023-07-23 MED ORDER — TRAZODONE HCL 50 MG PO TABS
50.0000 mg | ORAL_TABLET | Freq: Every evening | ORAL | Status: DC | PRN
  Administered 2023-07-29 – 2023-08-02 (×3): 50 mg via ORAL
  Filled 2023-07-23 (×3): qty 1

## 2023-07-23 MED ORDER — HYDROXYZINE HCL 25 MG PO TABS
25.0000 mg | ORAL_TABLET | Freq: Three times a day (TID) | ORAL | Status: DC | PRN
  Administered 2023-07-28 – 2023-08-03 (×3): 25 mg via ORAL
  Filled 2023-07-23 (×3): qty 1

## 2023-07-23 MED ORDER — FLUOXETINE HCL 20 MG PO CAPS
60.0000 mg | ORAL_CAPSULE | Freq: Every day | ORAL | Status: DC
  Administered 2023-07-25 – 2023-08-04 (×12): 60 mg via ORAL
  Filled 2023-07-23 (×11): qty 3

## 2023-07-23 MED ORDER — OLANZAPINE 10 MG IM SOLR: 10.0000 mg | Freq: Three times a day (TID) | INTRAMUSCULAR | Status: DC | PRN

## 2023-07-23 MED ORDER — ACETAMINOPHEN 160 MG/5ML PO SOLN: 1000.0000 mg | Freq: Four times a day (QID) | ORAL | Status: DC

## 2023-07-23 MED ORDER — OXYBUTYNIN CHLORIDE 5 MG PO TABS
5.0000 mg | ORAL_TABLET | Freq: Three times a day (TID) | ORAL | Status: DC
  Administered 2023-07-24 – 2023-07-29 (×15): 5 mg via ORAL
  Filled 2023-07-23 (×23): qty 1

## 2023-07-23 NOTE — Progress Notes (Signed)
   Trauma/Critical Care Follow Up Note  Subjective:    Overnight Issues:  Intermittently agitated. Refusing all care.  Objective:  Vital signs for last 24 hours: Temp:  [97.9 F (36.6 C)-98.4 F (36.9 C)] 97.9 F (36.6 C) (12/27 0334) Pulse Rate:  [59-103] 59 (12/27 0800) Resp:  [12-23] 20 (12/27 0700) BP: (109-128)/(55-74) 121/65 (12/27 0800) SpO2:  [95 %-100 %] 100 % (12/27 0800)  Hemodynamic parameters for last 24 hours:    Intake/Output from previous day: 12/26 0701 - 12/27 0700 In: 309.1 [I.V.:309.1] Out: 1350 [Urine:1350]  Intake/Output this shift: Total I/O In: -  Out: 700 [Urine:700]  Vent settings for last 24 hours:    Physical Exam:  Gen: comfortable, no distress Neuro: sedated on exam HEENT: PERRL Neck: atraumatic  CV: RRR Pulm: unlabored breathing on room air Abd: soft, NT GU: urine is clear Extr: wwp, no edema  Results for orders placed or performed during the hospital encounter of 07/17/23 (from the past 24 hours)  Glucose, capillary     Status: None   Collection Time: 07/22/23  7:54 PM  Result Value Ref Range   Glucose-Capillary 86 70 - 99 mg/dL     Assessment & Plan: The plan of care was discussed with the bedside nurse for the day who is in agreement with this plan and no additional concerns were raised.   Present on Admission:  Trauma    LOS: 6 days   Suicide attempt, 30-40 foot jump into ravine - trauma scans completed and no traumatic injuries. No new injuries on tertiary exam. Complains of diffuse pain without evidence of injury. Ibuprofen/tylenol and PT ordered - h/o multiple prior attempts per chart review with most recent 12/20 - ingestion of tide pods - psych consult for suicide attempt. Psych meds per their team. IVC'd. 1:1 sitter. Will need inpatient psych - accepted and prepared for discharge 12/24 but patient experienced a fall and head strike  Acute Hypoxic Respiratory Failure following trauma - Intubated 12/24 after a  fall and head strike. CTH negative - Extubated 12.25  ?Seizure activity - Neurology consulted, episode seems psychogenic in nature. EEG negative for seizures   FEN - Regular diet DVT - SCDs, LMWH Dispo - ICU - Need to reengage facilities for discharge to inpatient psych. Medically ready  Critical Care Total Time: 35 minutes  [facility] Please use [email or web address] to contact on call provider  07/23/2023  *Care during the described time interval was provided by me. I have reviewed this patient's available data, including medical history, events of note, physical examination and test results as part of my evaluation.
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
## 2023-07-23 NOTE — TOC Progression Note (Signed)
Transition of Care [facility]) - Progression Note    Patient Details  Name: Vincent Frank MRN: [id number] Date of Birth: 2000/08/18  Transition of Care [facility]) CM/SW Contact  Glennon Mac, RN Phone Number: 07/23/2023, 12:11pm   Clinical Narrative:    Received call from Dr. Hoyle Sauer, patient's current psychiatrist at [facility]; phone: [phone]. He states he has been seeing patient for some time, and is available to speak with psych providers should they need his input.  Passed information on to current psych attending.   Dr. Hoyle Sauer states he will be happy to call one of his colleagues to see if this will help get patient into inpatient psych unit at [facility]. Appreciate his assistance.   Addendum: 12:39pm Spoke with Grover Canavan at [facility]; she states that psych unit at [facility] currently has no bed availability.  Patient currently not medically stable as is requiring in and out caths due to urinary retention.  Upon medical stability, patient will need to be faxed out again to area psych facilities. TOC to follow.    Expected Discharge Plan: Psychiatric Hospital Barriers to Discharge: Continued Medical Work up  Expected Discharge Plan and Services   Discharge Planning Services: CM Consult   Living arrangements for the past 2 months: Single Family Home Expected Discharge Date: 07/20/23                                     Social Determinants of Health (SDOH) Interventions SDOH Screenings   Food Insecurity: No Food Insecurity (07/19/2023)  Housing: High Risk (07/19/2023)  Transportation Needs: No Transportation Needs (07/19/2023)  Utilities: Not At Risk (07/19/2023)  Tobacco Use: Low Risk  (07/19/2023)    Readmission Risk Interventions     No data to display         Quintella Baton, RN, BSN  Trauma/Neuro ICU Case Manager [phone]

## 2023-07-23 NOTE — Progress Notes (Signed)
Pt has been calm and communicative this morning. He is refusing all medications and initially refused to speak to providers this morning. After encouragement for him to participate and make his own decisions to avoid having to speak to his family he did speak to providers. Psych and Trauma are both aware that he is refusing medications and food/drink. Preformed self care tasks with PT/OT. Safety sitter at bedside, safety maintained.

## 2023-07-23 NOTE — Progress Notes (Signed)
Vincent Frank has not voided since catheter was removed this morning. He has not had anything to eat/drink today. He is still refusing medications and is refusing a bladder scan. Dr. Azucena Cecil made aware. Dr. Azucena Cecil stated Pt must be bladder scanned or I&O cathed. Instructed to place Pt into restraints and give PRN ativan if Pt refused care. This was explained to [facility], he was then agreeable to bladder scan. He stated he was not uncomfortable and did not feel the need to void. Bladder scan for 410 which is less than policy to I&O cath. Dr. Azucena Cecil made aware, instructed to re-check in 6 hours. Vincent Frank was encouraged to use urinal or verbalize that he needed to go to the restroom when he was ready.
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
## 2023-07-23 NOTE — TOC Progression Note (Signed)
IVC accepted by [facility], case #[id number]. Copies on chart and [employer] called for service.   Dellie Burns, MSW, LCSW [phone] (coverage)
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
## 2023-07-24 MED ORDER — ACETAMINOPHEN 160 MG/5ML PO SOLN
1000.0000 mg | Freq: Four times a day (QID) | ORAL | Status: DC | PRN
  Administered 2023-07-26: 1000 mg via ORAL
  Filled 2023-07-24: qty 40.6

## 2023-07-24 MED ORDER — TAMSULOSIN HCL 0.4 MG PO CAPS
0.4000 mg | ORAL_CAPSULE | Freq: Every day | ORAL | Status: DC
  Administered 2023-07-25 – 2023-08-04 (×12): 0.4 mg via ORAL
  Filled 2023-07-24 (×12): qty 1

## 2023-07-24 MED ORDER — POLYETHYLENE GLYCOL 3350 17 G PO PACK: 17.0000 g | PACK | Freq: Every day | ORAL | Status: DC | PRN

## 2023-07-24 MED ORDER — IBUPROFEN 200 MG PO TABS
600.0000 mg | ORAL_TABLET | Freq: Four times a day (QID) | ORAL | Status: DC | PRN
  Administered 2023-07-24 – 2023-08-02 (×11): 600 mg via ORAL
  Filled 2023-07-24 (×11): qty 3

## 2023-07-24 NOTE — Progress Notes (Signed)
Patient refusing all medications at this time, trauma MD is aware.

## 2023-07-24 NOTE — Progress Notes (Signed)
Bladder scanned at 0145, displaying volume of 575 mL. Patient claimed that he did not have to pee and refused to attempt to void. After relaying this information to Dr. Azucena Cecil, this nurse was instructed to place an indwelling catheter.

## 2023-07-24 NOTE — Progress Notes (Signed)
I discussed with Dr. Jannifer Franklin at 1053 this morning regarding my request for consideration of a forced med order to administer flomax to r/o and treat organic etiology for urinary retention and allow removal of foley catheter. Dr. Jannifer Franklin was in agreement with the plan and stated he would place an order for forced meds.   Nursing notified me at 1659 that patient was refusing all medications. I advised her that there should be a forced med order in place and to contact psych for further direction. Dr. Jannifer Franklin responded that upon his review, he felt the patient only had an indication for IVC for psychiatric treatment, not for medical treatment.   I called Dr. Jannifer Franklin again to explain that the indication for forced med order was to be able to rule out organic etiology for urinary retention and be able to remove the foley catheter, substantially expanding his options for inpatient psychiatric admission. Dr. Jannifer Franklin stated he would see the patient on 12/29.   Diamantina Monks, MD General and Trauma Surgery [facility]
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
## 2023-07-24 NOTE — TOC Progression Note (Signed)
Transition of Care [facility]) - Progression Note    Patient Details  Name: Vincent Frank MRN: [id number] Date of Birth: Oct 24, 2000  [facility] CM/SW Contact  Deatra Robinson, [place] Phone Number: 07/24/2023, 10:53 AM  Clinical Narrative:  psych hospital search expanded and referral made to [facility] per MD request. Will provide updates as available.   Dellie Burns, MSW, LCSW [phone] (coverage)       Expected Discharge Plan: Psychiatric Hospital Barriers to Discharge: Continued Medical Work up  Expected Discharge Plan and Services   Discharge Planning Services: CM Consult   Living arrangements for the past 2 months: Single Family Home Expected Discharge Date: 07/20/23                                     Social Determinants of Health (SDOH) Interventions SDOH Screenings   Food Insecurity: No Food Insecurity (07/19/2023)  Housing: High Risk (07/19/2023)  Transportation Needs: No Transportation Needs (07/19/2023)  Utilities: Not At Risk (07/19/2023)  Tobacco Use: Low Risk  (07/19/2023)    Readmission Risk Interventions     No data to display

## 2023-07-24 NOTE — Progress Notes (Addendum)
   07/24/23 1630  Vitals  Temp  (pt refused VS at this time)  Pulse Rate 80  Pulse Rate Source Monitor  Resp 16  Level of Consciousness  Level of Consciousness Responds to Voice  MEWS COLOR  MEWS Score Color Green  Oxygen Therapy  SpO2 95 %  O2 Device Room Air  Pain Assessment  Pain Scale 0-10  Pain Score Asleep  Glasgow Coma Scale  Eye Opening 4  Best Verbal Response (NON-intubated) 5  Best Motor Response 6  Glasgow Coma Scale Score 15  MEWS Score  MEWS Temp 0  MEWS Systolic 0  MEWS Pulse 0  MEWS RR 0  MEWS LOC 1  MEWS Score 1   Pt transferred from ICU. Pt MAEx4. Pt refusing this RN to do admission head-toe-assessment. Pt refusing VS, telemetry and medications. CN made aware and MD notified. Pt educated, but still refused.
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
## 2023-07-25 NOTE — Progress Notes (Addendum)
MD aware of pt refusing medications at this time. MD advised RN to notify psych for further direction. Dr. Jannifer Franklin notified. No new orders received.
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
## 2023-07-26 DIAGNOSIS — F332 Major depressive disorder, recurrent severe without psychotic features: Secondary | ICD-10-CM | POA: Diagnosis not present

## 2023-07-26 MED ORDER — DOCUSATE SODIUM 100 MG PO CAPS
100.0000 mg | ORAL_CAPSULE | Freq: Two times a day (BID) | ORAL | Status: DC
  Administered 2023-07-26 – 2023-08-04 (×17): 100 mg via ORAL
  Filled 2023-07-26 (×18): qty 1

## 2023-07-26 MED ORDER — ACETAMINOPHEN 500 MG PO TABS
1000.0000 mg | ORAL_TABLET | Freq: Four times a day (QID) | ORAL | Status: DC | PRN
  Administered 2023-07-29 – 2023-08-03 (×7): 1000 mg via ORAL
  Filled 2023-07-26 (×8): qty 2

## 2023-07-26 MED ORDER — POLYETHYLENE GLYCOL 3350 17 G PO PACK
17.0000 g | PACK | Freq: Two times a day (BID) | ORAL | Status: DC
  Administered 2023-07-26 – 2023-08-04 (×14): 17 g via ORAL
  Filled 2023-07-26 (×18): qty 1

## 2023-07-26 NOTE — Plan of Care (Signed)
  Problem: Education: Goal: Knowledge of General Education information will improve Description: Including pain rating scale, medication(s)/side effects and non-pharmacologic comfort measures Outcome: Not Progressing   Problem: Health Behavior/Discharge Planning: Goal: Ability to manage health-related needs will improve Outcome: Not Progressing   Problem: Clinical Measurements: Goal: Ability to maintain clinical measurements within normal limits will improve Outcome: Not Progressing Goal: Will remain free from infection Outcome: Not Progressing Goal: Diagnostic test results will improve Outcome: Not Progressing Goal: Respiratory complications will improve Outcome: Not Progressing Goal: Cardiovascular complication will be avoided Outcome: Not Progressing   Problem: Activity: Goal: Risk for activity intolerance will decrease Outcome: Not Progressing   Problem: Elimination: Goal: Will not experience complications related to bowel motility Outcome: Not Progressing Goal: Will not experience complications related to urinary retention Outcome: Not Progressing   Problem: Pain Management: Goal: General experience of comfort will improve Outcome: Not Progressing   Problem: Safety: Goal: Ability to remain free from injury will improve Outcome: Not Progressing   Problem: Skin Integrity: Goal: Risk for impaired skin integrity will decrease Outcome: Not Progressing
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
## 2023-07-26 NOTE — Plan of Care (Signed)

## 2023-07-27 DIAGNOSIS — F332 Major depressive disorder, recurrent severe without psychotic features: Secondary | ICD-10-CM | POA: Diagnosis not present
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
## 2023-07-27 NOTE — Plan of Care (Signed)
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
## 2023-07-27 NOTE — TOC Progression Note (Signed)
 Transition of Care [facility]) - Progression Note    Patient Details  Name: Vincent Frank MRN: [id number] Date of Birth: [birth year]  Transition of Care [facility]) CM/SW Contact  Balraj Brayfield M, RN Phone Number: 07/27/2023, 3:46 PM  Clinical Narrative:    Patient medically stable for discharge to psych facility, but still has an indwelling foley catheter, which is a barrier for admission to facilities.  [facility] can manage foley, and referral was made to hospital on 07/24/2023.    Last IVC Renewal was on 07/23/2023; next renewal due on 07/30/2023.     Expected Discharge Plan: Psychiatric Hospital Barriers to Discharge: Continued Medical Work up  Expected Discharge Plan and Services   Discharge Planning Services: CM Consult   Living arrangements for the past 2 months: Single Family Home Expected Discharge Date: 07/20/23                                     Social Determinants of Health (SDOH) Interventions SDOH Screenings   Food Insecurity: No Food Insecurity (07/19/2023)  Housing: High Risk (07/19/2023)  Transportation Needs: No Transportation Needs (07/19/2023)  Utilities: Not At Risk (07/19/2023)  Tobacco Use: Low Risk  (07/19/2023)    Readmission Risk Interventions     No data to display         Mliss MICAEL Fass, RN, BSN  Trauma/Neuro ICU Case Manager [phone]

## 2023-07-28 DIAGNOSIS — Z751 Person awaiting admission to adequate facility elsewhere: Secondary | ICD-10-CM | POA: Diagnosis not present

## 2023-07-28 DIAGNOSIS — Y9289 Other specified places as the place of occurrence of the external cause: Secondary | ICD-10-CM | POA: Diagnosis not present

## 2023-07-28 DIAGNOSIS — Z792 Long term (current) use of antibiotics: Secondary | ICD-10-CM | POA: Diagnosis not present

## 2023-07-28 DIAGNOSIS — F909 Attention-deficit hyperactivity disorder, unspecified type: Secondary | ICD-10-CM | POA: Diagnosis present

## 2023-07-28 DIAGNOSIS — N319 Neuromuscular dysfunction of bladder, unspecified: Secondary | ICD-10-CM | POA: Diagnosis not present

## 2023-07-28 DIAGNOSIS — W1830XA Fall on same level, unspecified, initial encounter: Secondary | ICD-10-CM | POA: Diagnosis not present

## 2023-07-28 DIAGNOSIS — M549 Dorsalgia, unspecified: Secondary | ICD-10-CM | POA: Diagnosis present

## 2023-07-28 DIAGNOSIS — X80XXXA Intentional self-harm by jumping from a high place, initial encounter: Secondary | ICD-10-CM | POA: Diagnosis present

## 2023-07-28 DIAGNOSIS — Y92231 Patient bathroom in hospital as the place of occurrence of the external cause: Secondary | ICD-10-CM | POA: Diagnosis not present

## 2023-07-28 DIAGNOSIS — F332 Major depressive disorder, recurrent severe without psychotic features: Secondary | ICD-10-CM | POA: Diagnosis present

## 2023-07-28 DIAGNOSIS — F411 Generalized anxiety disorder: Secondary | ICD-10-CM | POA: Diagnosis present

## 2023-07-28 DIAGNOSIS — F445 Conversion disorder with seizures or convulsions: Secondary | ICD-10-CM | POA: Diagnosis not present

## 2023-07-28 DIAGNOSIS — Z9151 Personal history of suicidal behavior: Secondary | ICD-10-CM | POA: Diagnosis not present

## 2023-07-28 DIAGNOSIS — Z91030 Bee allergy status: Secondary | ICD-10-CM | POA: Diagnosis not present

## 2023-07-28 DIAGNOSIS — J9601 Acute respiratory failure with hypoxia: Secondary | ICD-10-CM | POA: Diagnosis not present

## 2023-07-28 DIAGNOSIS — N419 Inflammatory disease of prostate, unspecified: Secondary | ICD-10-CM | POA: Diagnosis present

## 2023-07-28 DIAGNOSIS — R339 Retention of urine, unspecified: Secondary | ICD-10-CM | POA: Diagnosis not present

## 2023-07-28 DIAGNOSIS — Z608 Other problems related to social environment: Secondary | ICD-10-CM | POA: Diagnosis present

## 2023-07-28 DIAGNOSIS — Z62810 Personal history of physical and sexual abuse in childhood: Secondary | ICD-10-CM | POA: Diagnosis not present

## 2023-07-28 DIAGNOSIS — F603 Borderline personality disorder: Secondary | ICD-10-CM | POA: Diagnosis present

## 2023-07-28 DIAGNOSIS — Z79899 Other long term (current) drug therapy: Secondary | ICD-10-CM | POA: Diagnosis not present

## 2023-07-28 DIAGNOSIS — F4310 Post-traumatic stress disorder, unspecified: Secondary | ICD-10-CM | POA: Diagnosis present

## 2023-07-28 DIAGNOSIS — F940 Selective mutism: Secondary | ICD-10-CM | POA: Diagnosis not present

## 2023-07-28 MED ORDER — DIPHENHYDRAMINE HCL 25 MG PO CAPS
25.0000 mg | ORAL_CAPSULE | Freq: Once | ORAL | Status: AC
  Administered 2023-07-28: 25 mg via ORAL
  Filled 2023-07-28: qty 1

## 2023-07-28 MED ORDER — SENNA 8.6 MG PO TABS
1.0000 | ORAL_TABLET | Freq: Every day | ORAL | Status: DC
  Administered 2023-07-28 – 2023-08-04 (×7): 8.6 mg via ORAL
  Filled 2023-07-28 (×7): qty 1

## 2023-07-28 MED ORDER — MAGNESIUM CITRATE PO SOLN
1.0000 | Freq: Once | ORAL | Status: AC
  Administered 2023-07-28: 1 via ORAL
  Filled 2023-07-28: qty 296

## 2023-07-28 NOTE — Progress Notes (Signed)
   07/28/23 1719  Urine Characteristics  Urinary Interventions Bladder scan;Intermittent/Straight cath  Bladder Scan Volume (mL) 638 mL  Intermittent/Straight Cath (mL) 800 mL  Post Void Cath Residual (mL) 0 mL

## 2023-07-28 NOTE — Progress Notes (Signed)
 Called to pt room by [facility] sitter present at bedside.  Pt reports that he has eaten a bite from a [employer] cup.  He has a documented peanut allergy and states that he didn't realize what it was until he had already eaten it.  Pt initially had elevated heart rate to 130's but that has now resolved to his prior HR in the 90's.  No further signs or symptoms of anaphylactic reaction.  CHARLENA Blush with trauma service notified.  Ordered 25 mg benadryl .

## 2023-07-28 NOTE — Progress Notes (Signed)
 Benadryl  given as ordered.  Pt vital signs are stable HR 86, RR 21, O2 sats 90 on 2LNC.  Pt insists that allergen ingestion was accidental in nature.  He verbally states that he is not currently having thoughts of hurting himself or others and that he does not have a plan in place to cause harm to himself or others.

## 2023-07-28 NOTE — Plan of Care (Signed)
  Problem: Education: Goal: Knowledge of General Education information will improve Description: Including pain rating scale, medication(s)/side effects and non-pharmacologic comfort measures Outcome: Progressing   Problem: Education: Goal: Knowledge of General Education information will improve Description: Including pain rating scale, medication(s)/side effects and non-pharmacologic comfort measures Outcome: Progressing   Problem: Health Behavior/Discharge Planning: Goal: Ability to manage health-related needs will improve Outcome: Progressing   Problem: Education: Goal: Knowledge of General Education information will improve Description: Including pain rating scale, medication(s)/side effects and non-pharmacologic comfort measures Outcome: Progressing   Problem: Clinical Measurements: Goal: Ability to maintain clinical measurements within normal limits will improve Outcome: Progressing Goal: Will remain free from infection Outcome: Progressing Goal: Diagnostic test results will improve Outcome: Progressing Goal: Respiratory complications will improve Outcome: Progressing Goal: Cardiovascular complication will be avoided Outcome: Progressing   Problem: Nutrition: Goal: Adequate nutrition will be maintained Outcome: Progressing   Problem: Coping: Goal: Level of anxiety will decrease Outcome: Progressing   Problem: Elimination: Goal: Will not experience complications related to bowel motility Outcome: Progressing Goal: Will not experience complications related to urinary retention Outcome: Progressing
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
## 2023-07-29 MED ORDER — DIPHENHYDRAMINE HCL 25 MG PO CAPS
25.0000 mg | ORAL_CAPSULE | Freq: Three times a day (TID) | ORAL | Status: DC | PRN
  Administered 2023-08-03: 25 mg via ORAL
  Filled 2023-07-29: qty 1
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
## 2023-07-29 NOTE — Progress Notes (Signed)
   07/29/23 0810  Urine Characteristics  Urinary Interventions Bladder scan;Intermittent/Straight cath  Bladder Scan Volume (mL) 530 mL  Intermittent/Straight Cath (mL) 1000 mL  Post Void Cath Residual (mL) 0 mL

## 2023-07-29 NOTE — Progress Notes (Deleted)
 Pt c/o pain is not controlled without dilaudid PRN for breakthrough pain. Voiced fear he would be unable to sleep with the pain tonight. Dr. Janee Morn notified see new order.

## 2023-07-29 NOTE — Progress Notes (Addendum)
   07/29/23 1257  Urine Characteristics  Urinary Interventions Bladder scan  Bladder Scan Volume (mL) 452 mL   Pt continued with urinary retention. PA Maczis notified. See new order for coude catheter placement.   1456- Educated pt on placement of coude cath for urinary retention. Pt voiced he wanted to attempt to void on his own. Ambulated to the bathroom and able to void 300 ml. C/o burning/pain with urination. Urine clear and yellow without foul odor. Educated on may experience burning due to foley and I/O cath x 2. PA Maczis notified no new orders at this time.
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
## 2023-07-30 LAB — URINALYSIS, COMPLETE (UACMP) WITH MICROSCOPIC
Bacteria, UA: NONE SEEN
Bilirubin Urine: NEGATIVE
Glucose, UA: NEGATIVE mg/dL
Hgb urine dipstick: NEGATIVE
Ketones, ur: NEGATIVE mg/dL
Leukocytes,Ua: NEGATIVE
Nitrite: NEGATIVE
Protein, ur: NEGATIVE mg/dL
Specific Gravity, Urine: 1.015 (ref 1.005–1.030)
pH: 6 (ref 5.0–8.0)

## 2023-07-30 NOTE — Progress Notes (Signed)
 Patient ID: Vincent Frank, male   DOB: 2001-04-10, 23 y.o.   MRN: [id number]  UA sent  Lidia Collum, RN

## 2023-07-30 NOTE — TOC Progression Note (Signed)
 Transition of Care [facility]) - Progression Note    Patient Details  Name: Vincent Frank MRN: [id number] Date of Birth: [birth year]  Transition of Care [facility]) CM/SW Contact  Montie LOISE Louder, [place] Phone Number: 07/30/2023, 4:52 PM  Clinical Narrative:     IVC completed envelope # [id number]  Expected Discharge Plan: Psychiatric Hospital Barriers to Discharge: Continued Medical Work up  Expected Discharge Plan and Services   Discharge Planning Services: CM Consult   Living arrangements for the past 2 months: Single Family Home Expected Discharge Date: 07/20/23                                     Social Determinants of Health (SDOH) Interventions SDOH Screenings   Food Insecurity: No Food Insecurity (07/19/2023)  Housing: High Risk (07/19/2023)  Transportation Needs: No Transportation Needs (07/19/2023)  Utilities: Not At Risk (07/19/2023)  Tobacco Use: Low Risk  (07/19/2023)    Readmission Risk Interventions     No data to display

## 2023-07-30 NOTE — Plan of Care (Signed)
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
## 2023-07-30 NOTE — TOC Progression Note (Signed)
 Transition of Care [facility]) - Progression Note    Patient Details  Name: Vincent Frank MRN: [id number] Date of Birth: [birth year]  Transition of Care [facility]) CM/SW Contact  Montie LOISE Louder, [place] Phone Number: 07/30/2023, 3:02 PM  Clinical Narrative:     Renewing patient's IVC envelope # [id number] IVC has been uploading, waiting on Magistrate to review - IVC pending  TOC will continue to follow and assist with discharge planning.  Montie Louder, MSW, LCSW Clinical Social Worker    Expected Discharge Plan: Psychiatric Hospital Barriers to Discharge: Continued Medical Work up  Expected Discharge Plan and Services   Discharge Planning Services: CM Consult   Living arrangements for the past 2 months: Single Family Home Expected Discharge Date: 07/20/23                                     Social Determinants of Health (SDOH) Interventions SDOH Screenings   Food Insecurity: No Food Insecurity (07/19/2023)  Housing: High Risk (07/19/2023)  Transportation Needs: No Transportation Needs (07/19/2023)  Utilities: Not At Risk (07/19/2023)  Tobacco Use: Low Risk  (07/19/2023)    Readmission Risk Interventions     No data to display

## 2023-07-31 MED ORDER — HYDROMORPHONE HCL 1 MG/ML IJ SOLN
0.5000 mg | Freq: Once | INTRAMUSCULAR | Status: AC
  Administered 2023-07-31: 0.5 mg via INTRAVENOUS
  Filled 2023-07-31: qty 0.5
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
## 2023-07-31 NOTE — Plan of Care (Signed)

## 2023-07-31 NOTE — TOC Progression Note (Signed)
 Transition of Care [facility]) - Progression Note    Patient Details  Name: Vincent Frank MRN: [id number] Date of Birth: [birth year]  Transition of Care [facility]) CM/SW Contact  Gwenn Julien Norris, [place] Phone Number: 07/31/2023, 7:58 AM  Clinical Narrative:  IVC completed yesterday 1/3, case #[id number].      Expected Discharge Plan: Psychiatric Hospital Barriers to Discharge: Continued Medical Work up  Expected Discharge Plan and Services   Discharge Planning Services: CM Consult   Living arrangements for the past 2 months: Single Family Home Expected Discharge Date: 07/20/23                                     Social Determinants of Health (SDOH) Interventions SDOH Screenings   Food Insecurity: No Food Insecurity (07/19/2023)  Housing: High Risk (07/19/2023)  Transportation Needs: No Transportation Needs (07/19/2023)  Utilities: Not At Risk (07/19/2023)  Tobacco Use: Low Risk  (07/19/2023)    Readmission Risk Interventions     No data to display
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
## 2023-08-01 NOTE — Plan of Care (Signed)

## 2023-08-02 MED ORDER — LAMOTRIGINE ER 50 MG PO TB24
50.0000 mg | ORAL_TABLET | Freq: Every day | ORAL | Status: DC
  Administered 2023-08-03 – 2023-08-04 (×2): 50 mg via ORAL
  Filled 2023-08-02 (×2): qty 1

## 2023-08-02 MED ORDER — LAMOTRIGINE 25 MG PO TABS
25.0000 mg | ORAL_TABLET | Freq: Two times a day (BID) | ORAL | Status: AC
  Administered 2023-08-02: 25 mg via ORAL
  Filled 2023-08-02: qty 1
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
## 2023-08-02 NOTE — Progress Notes (Signed)
 Participated in family meeting held by Psychiatry team. Plan for partial residential psychiatric facility. Begin working on [facility]. Patient phone number confirmed as [phone] and email as [email or web address]. Deconsary contact is mother Rumi Taras, [phone] and email [email or web address]  Vincent GEANNIE Hanger, MD General and Trauma Surgery [facility]

## 2023-08-02 NOTE — Progress Notes (Signed)
   08/02/23 1600  Unmeasured Output  Stool Occurrence 1  Urine Characteristics  Bladder Scan Volume (mL) 330 mL  Intermittent/Straight Cath (mL) 550 mL  Post Void Cath Residual (mL) 0 mL
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
## 2023-08-03 MED ORDER — ARIPIPRAZOLE 15 MG PO TABS: 15.0000 mg | ORAL_TABLET | Freq: Every day | ORAL | 0 refills | Status: DC

## 2023-08-03 MED ORDER — FLUOXETINE HCL 20 MG PO CAPS: 60.0000 mg | ORAL_CAPSULE | Freq: Every day | ORAL | 0 refills | Status: DC

## 2023-08-03 MED ORDER — HYDROXYZINE HCL 25 MG PO TABS: 25.0000 mg | ORAL_TABLET | Freq: Three times a day (TID) | ORAL | 0 refills | Status: DC | PRN

## 2023-08-03 MED ORDER — LAMOTRIGINE ER 50 MG PO TB24: ORAL_TABLET | ORAL | 0 refills | Status: DC

## 2023-08-03 NOTE — Progress Notes (Signed)
 NT called this nurse to room pt into feeling well n bed laying on right side unresponsive . Vitals WNL, shortly after pt started shaking of hands and bilateral toes curled inward, episode lasting 1 monute MD was also informed. MD arrived to pt room given nasal stimulant pt then was aroused and opened eyes. Pt verbal following commands. Will continue to monitor.
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
## 2023-08-03 NOTE — TOC Progression Note (Signed)
 Transition of Care [facility]) - Progression Note    Patient Details  Name: Vincent Frank MRN: [id number] Date of Birth: 05-10-2001  [facility] CM/SW Contact  Raven Furnas M, RN Phone Number: 08/03/2023, 1:25pm  Clinical Narrative:    Follow up appt has been scheduled with patient's psychiatrist at [facility]: Appt is January 13 at 5:00pm.    Expected Discharge Plan: Home/Self Care Barriers to Discharge: Continued Medical Work up  Expected Discharge Plan and Services   Discharge Planning Services: CM Consult   Living arrangements for the past 2 months: Single Family Home Expected Discharge Date: 07/20/23                                     Social Determinants of Health (SDOH) Interventions SDOH Screenings   Food Insecurity: No Food Insecurity (07/19/2023)  Housing: High Risk (07/19/2023)  Transportation Needs: No Transportation Needs (07/19/2023)  Utilities: Not At Risk (07/19/2023)  Tobacco Use: Low Risk  (07/19/2023)    Readmission Risk Interventions     No data to display         Mliss MICAEL Fass, RN, BSN  Trauma/Neuro ICU Case Manager [phone]

## 2023-08-03 NOTE — Significant Event (Signed)
 Trauma Event Note    Notified by unit RN that patient has seizure like activity tonight after seeing flashing lights outside his window. Unresponsive to sternal rub, rhythmic shaking to RLE. VSS, no oral trauma noted, no incontinence. Room air sats 96%. Placed ammonia  inhalent under patient's nose and he became more alert. Talking to staff members shortly after. MD stechschulte notified. Continue current plan of care.  Last imported Vital Signs BP 117/68   Pulse 90   Temp 98.4 F (36.9 C) (Oral)   Resp 20   Ht 6' 3.98 (1.93 m)   Wt 242 lb 8.1 oz (110 kg)   SpO2 96%   BMI 29.53 kg/m      Vincent Frank  Trauma Response RN  Please call TRN at [phone] for further assistance.
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
## 2023-08-03 NOTE — Progress Notes (Signed)
 Pt encouraged to void since scan showed 316, pt prefers to void vs in and out cath, pt ambulated to bathroom and was able to void adequate amount of urine, pt reports to feeling better after voiding. In and out cath help at this point.

## 2023-08-04 ENCOUNTER — Encounter (HOSPITAL_COMMUNITY): Payer: Self-pay

## 2023-08-04 NOTE — Plan of Care (Signed)
 Pt D/c to home with family.  BP 110/70 (BP Location: Left Arm)   Pulse 97   Temp 97.8 F (36.6 C) (Oral)   Resp 13   Ht 6' 3.98 (1.93 m)   Wt 110 kg   SpO2 96%   BMI 29.53 kg/m  AVS reviewed, IV removed. All follow up questions reviewed and answered. No further needs voiced at this time. Aureliano VEAR Louder 08/04/23 11:31 AM
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
## 2023-08-04 NOTE — Plan of Care (Signed)
  Problem: Education: Goal: Knowledge of General Education information will improve Description: Including pain rating scale, medication(s)/side effects and non-pharmacologic comfort measures 08/04/2023 1131 by Vicci Aureliano DEL, RN Outcome: Adequate for Discharge 08/04/2023 1130 by Vicci Aureliano DEL, RN Outcome: Adequate for Discharge   Problem: Clinical Measurements: Goal: Ability to maintain clinical measurements within normal limits will improve 08/04/2023 1131 by Vicci Aureliano DEL, RN Outcome: Adequate for Discharge 08/04/2023 1130 by Vicci Aureliano DEL, RN Outcome: Adequate for Discharge Goal: Will remain free from infection 08/04/2023 1131 by Vicci Aureliano DEL, RN Outcome: Adequate for Discharge 08/04/2023 1130 by Vicci Aureliano DEL, RN Outcome: Adequate for Discharge Goal: Diagnostic test results will improve 08/04/2023 1131 by Vicci Aureliano DEL, RN Outcome: Adequate for Discharge 08/04/2023 1130 by Vicci Aureliano DEL, RN Outcome: Adequate for Discharge Goal: Respiratory complications will improve 08/04/2023 1131 by Vicci Aureliano DEL, RN Outcome: Adequate for Discharge 08/04/2023 1130 by Vicci Aureliano DEL, RN Outcome: Adequate for Discharge Goal: Cardiovascular complication will be avoided 08/04/2023 1131 by Vicci Aureliano DEL, RN Outcome: Adequate for Discharge 08/04/2023 1130 by Vicci Aureliano DEL, RN Outcome: Adequate for Discharge   Problem: Clinical Measurements: Goal: Will remain free from infection 08/04/2023 1131 by Vicci Aureliano DEL, RN Outcome: Adequate for Discharge 08/04/2023 1130 by Vicci Aureliano DEL, RN Outcome: Adequate for Discharge   Problem: Clinical Measurements: Goal: Will remain free from infection 08/04/2023 1131 by Vicci Aureliano DEL, RN Outcome: Adequate for Discharge 08/04/2023 1130 by Vicci Aureliano DEL, RN Outcome: Adequate for Discharge   Problem: Activity: Goal: Risk for activity intolerance will decrease 08/04/2023 1131 by Vicci Aureliano DEL, RN Outcome: Adequate for Discharge 08/04/2023 1130 by  Vicci Aureliano DEL, RN Outcome: Adequate for Discharge   Problem: Nutrition: Goal: Adequate nutrition will be maintained 08/04/2023 1131 by Vicci Aureliano DEL, RN Outcome: Adequate for Discharge 08/04/2023 1130 by Vicci Aureliano DEL, RN Outcome: Adequate for Discharge   Problem: Coping: Goal: Level of anxiety will decrease 08/04/2023 1131 by Vicci Aureliano DEL, RN Outcome: Adequate for Discharge 08/04/2023 1130 by Vicci Aureliano DEL, RN Outcome: Adequate for Discharge   Problem: Elimination: Goal: Will not experience complications related to bowel motility 08/04/2023 1131 by Vicci Aureliano DEL, RN Outcome: Adequate for Discharge 08/04/2023 1130 by Vicci Aureliano DEL, RN Outcome: Adequate for Discharge Goal: Will not experience complications related to urinary retention 08/04/2023 1131 by Vicci Aureliano DEL, RN Outcome: Adequate for Discharge 08/04/2023 1130 by Vicci Aureliano DEL, RN Outcome: Adequate for Discharge   Problem: Pain Management: Goal: General experience of comfort will improve 08/04/2023 1131 by Vicci Aureliano DEL, RN Outcome: Adequate for Discharge 08/04/2023 1130 by Vicci Aureliano DEL, RN Outcome: Adequate for Discharge   Problem: Safety: Goal: Ability to remain free from injury will improve 08/04/2023 1131 by Vicci Aureliano DEL, RN Outcome: Adequate for Discharge 08/04/2023 1130 by Vicci Aureliano DEL, RN Outcome: Adequate for Discharge   Problem: Skin Integrity: Goal: Risk for impaired skin integrity will decrease 08/04/2023 1131 by Vicci Aureliano DEL, RN Outcome: Adequate for Discharge 08/04/2023 1130 by Vicci Aureliano DEL, RN Outcome: Adequate for Discharge

## 2023-08-04 NOTE — Discharge Instructions (Signed)
  Psychiatry Instructions  - Follow-up with your outpatient psychiatric provider -instructions on appointment date, time, and address (location) are provided to you in discharge paperwork. - [facility] with Cherene Gavel, DBT appointment: 08/09/23 at 1 PM  - [facility] Dr. Joann, in person appointment: 08/09/23 at 5 PM   -Take your psychiatric medications as prescribed at discharge - instructions are provided to you in the discharge paperwork - Increase your Lamictal  dose to 100 mg daily on 1/11   -Follow-up with outpatient primary care doctor and other specialists -for management of preventative medicine and any chronic medical disease.  -Recommend abstinence from alcohol, tobacco, and other illicit drug use at discharge.   -If your psychiatric symptoms recur, worsen, or if you have side effects to your psychiatric medications, call your outpatient psychiatric provider, 911, [phone] or go to the nearest emergency department.  -If suicidal thoughts occur, call your outpatient psychiatric provider, 911, [phone] or go to the nearest emergency department.  Naloxone (Narcan) can help reverse an overdose when given to the victim quickly.  [facility] offers free naloxone kits and instructions/training on its use.  Add naloxone to your first aid kit and you can help save a life.   Pick up your free kit at the following locations:   [place]:  [facility], [address] [place] [address] [phone]) [facility] [address] [place] [address] [phone]) [facility] [address] [place] [address]  [place]: [facility] [address] [place] [address] [phone]) [facility] [address] [place] [address] [phone])

## 2023-08-04 NOTE — Discharge Summary (Signed)
 Patient ID: Vincent Frank [id number] 2000/12/17 22 y.o.  Admit date: 07/17/2023 Discharge date: 08/04/2023   Discharge Diagnosis Suicide attempt, 30-40 foot jump into ravine ?Seizure activity - felt to be psychogenic in nature. Urinary retention   Consultants Psych Neurology   HPI  23 yo male with history of suicide attempts jumped from a 35 ft bridge into a [place]vine[place]stream. He was talking to EMS and said he wanted to harm himself   Procedures None  Hospital Course:  Patient presented as above.  Per primary survey, initially airway intact, breath sounds present bilaterally, pulses intact, then patient stopped responding to questions and had intermittent tachypnea. He was intubated by Dr. Lenor for disability.  Workup negative for traumatic injuries.  Patient was weaned off sedation and extubated. Psychiatry consulted and recommended inpatient admission and IVC'd patient.  Patient was accepted to psych facility on 12/24 but prior to discharge had a witnessed fall in the bathroom and struck his head on the floor.  He was noted to be unresponsive with GCS 3~10 minutes beyond fall; airway secured with anesthesia. Neurology consulted for loss of consciousness in conjunction with eyelid fluttering yesterday, with no responses to any stimuli at that time manifesting as a GCS of 3 LTM EEG with no  seizures or epileptiform discharges were seen throughout the recording. Neurology suspected a psychogenic event. Patient extubated 12/26. Patient with urinary retention during admission requiring foley. Patient reported to be requiring I/O prior to arrival. Started on flomax . Pateint with hx of reported prostatitis prior to arrival on Cipro . This was continued inpatient. UA for dysuria 1/3 negative. Case was discussed with urology during admission - will need outpatient urology follow up. Foley was able to be removed. Required intermittent I/O. On 1/8 the patient was cleared for d/c home with his  parents from psych clearance. Case discussed with my attending. Stable for d/c. Follow up as noted below.    Allergies as of 08/04/2023       Reactions   Bee Venom Anaphylaxis   Haldol [haloperidol] Anaphylaxis, Other (See Comments)   Dystonic reaction with muscle contractures   Hornet Venom Anaphylaxis   Peanut (diagnostic) Anaphylaxis   Wasp Venom Anaphylaxis        Medication List     STOP taking these medications    lamoTRIgine  25 MG tablet Commonly known as: LAMICTAL  Replaced by: lamoTRIgine  50 MG 24 hour tablet   levETIRAcetam  500 MG tablet Commonly known as: KEPPRA        TAKE these medications    ARIPiprazole  15 MG tablet Commonly known as: ABILIFY  Take 1 tablet (15 mg total) by mouth daily. What changed:  medication strength how much to take when to take this   ciprofloxacin  500 MG tablet Commonly known as: CIPRO  Take 500 mg by mouth 2 (two) times daily.   fenofibrate  145 MG tablet Commonly known as: TRICOR  Take 145 mg by mouth daily.   FLUoxetine  20 MG capsule Commonly known as: PROZAC  Take 3 capsules (60 mg total) by mouth daily.   folic acid 1 MG tablet Commonly known as: FOLVITE Take 1 mg by mouth daily.   hydrOXYzine  25 MG tablet Commonly known as: ATARAX  Take 25 mg by mouth 3 (three) times daily as needed for anxiety. What changed: Another medication with the same name was added. Make sure you understand how and when to take each.   hydrOXYzine  25 MG tablet Commonly known as: ATARAX  Take 1 tablet (25 mg total) by mouth 3 (three) times  daily as needed for anxiety. What changed: You were already taking a medication with the same name, and this prescription was added. Make sure you understand how and when to take each.   lamoTRIgine  50 MG 24 hour tablet Commonly known as: LAMICTAL  XR Take 1 tablet (50 mg total) by mouth daily for 3 days, THEN 2 tablets (100 mg total) daily for 14 days. Start taking on: August 04, 2023 Replaces:  lamoTRIgine  25 MG tablet   naproxen  500 MG tablet Commonly known as: NAPROSYN  Take 500 mg by mouth 2 (two) times daily as needed for moderate pain (pain score 4-6).   oxybutynin  5 MG tablet Commonly known as: DITROPAN  Take 5 mg by mouth 3 (three) times daily.   oxyCODONE -acetaminophen  5-325 MG tablet Commonly known as: PERCOCET/ROXICET Take 1 tablet by mouth every 8 (eight) hours as needed for severe pain (pain score 7-10).   tamsulosin  0.4 MG Caps capsule Commonly known as: FLOMAX  Take 0.4 mg by mouth 2 (two) times daily.   traZODone  50 MG tablet Commonly known as: DESYREL  Take 50 mg by mouth at bedtime as needed for sleep.          Follow-up Information     Dr. Sathyan Gurumurthy. Go on 08/09/2023.   Why: 5:00pm [facility] Contact information: Address:  [address] [place], [place] [address]  Phone: [phone]        [facility]. Go to.   Why: Address: [address] [place], [place] [address]  DBT appointment with Cherene Gavel scheduled at 08/09/2023 at 1 PM Contact information: Office Number: [phone]  Therapist email: [email or web address] [email or web address], Jerona, MD Follow up.   Specialty: Family Medicine Why: For follow up Contact information: [address] [facility] [place] [address] [phone]         [facility] Follow up.   Contact information: [address] [place]  [address] [phone]                Signed: Ozell Shaper, [facility] 08/04/2023, 11:23 AM Please see Amion for pager number during day hours 7:00am-4:30pm

## 2023-08-04 NOTE — Plan of Care (Signed)
  Problem: Acute Rehab PT Goals(only PT should resolve) Goal: Pt Will Go Supine/Side To Sit Outcome: Completed/Met Goal: Pt Will Go Sit To Supine/Side Outcome: Completed/Met Goal: Patient Will Transfer Sit To/From Stand Outcome: Completed/Met Goal: Pt Will Transfer Bed To Chair/Chair To Bed Outcome: Completed/Met Goal: Pt Will Ambulate Outcome: Completed/Met Goal: Pt Will Go Up/Down Stairs Outcome: Completed/Met  PT goals met, will d/c PT Micheline Portal, PT Acute Rehabilitation Services [phone] 08/04/2023
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
## 2023-08-20 ENCOUNTER — Other Ambulatory Visit (HOSPITAL_COMMUNITY): Payer: Self-pay

## 2023-11-09 ENCOUNTER — Emergency Department (HOSPITAL_COMMUNITY)

## 2023-11-09 ENCOUNTER — Other Ambulatory Visit: Payer: Self-pay

## 2023-11-09 ENCOUNTER — Emergency Department (HOSPITAL_COMMUNITY)
Admission: EM | Admit: 2023-11-09 | Discharge: 2023-11-10 | Disposition: A | Discharge status: Other Acute Inpatient Hospital | Attending: Emergency Medicine | Admitting: Emergency Medicine

## 2023-11-09 ENCOUNTER — Encounter (HOSPITAL_COMMUNITY): Payer: Self-pay

## 2023-11-09 DIAGNOSIS — X781XXA Intentional self-harm by knife, initial encounter: Secondary | ICD-10-CM | POA: Diagnosis not present

## 2023-11-09 DIAGNOSIS — X789XXA Intentional self-harm by unspecified sharp object, initial encounter: Secondary | ICD-10-CM

## 2023-11-09 DIAGNOSIS — F329 Major depressive disorder, single episode, unspecified: Secondary | ICD-10-CM | POA: Diagnosis not present

## 2023-11-09 DIAGNOSIS — T07XXXA Unspecified multiple injuries, initial encounter: Secondary | ICD-10-CM

## 2023-11-09 DIAGNOSIS — Y92830 Public park as the place of occurrence of the external cause: Secondary | ICD-10-CM | POA: Diagnosis not present

## 2023-11-09 DIAGNOSIS — S51812A Laceration without foreign body of left forearm, initial encounter: Secondary | ICD-10-CM | POA: Diagnosis present

## 2023-11-09 HISTORY — DX: Anxiety disorder, unspecified: F41.9

## 2023-11-09 HISTORY — DX: Depression, unspecified: F32.A

## 2023-11-09 MED ORDER — FENOFIBRATE 160 MG PO TABS
160.0000 mg | ORAL_TABLET | Freq: Every day | ORAL | Status: DC
  Administered 2023-11-10: 160 mg via ORAL
  Filled 2023-11-09: qty 1

## 2023-11-09 MED ORDER — LIDOCAINE-EPINEPHRINE (PF) 2 %-1:200000 IJ SOLN: 20.0000 mL | Freq: Once | INTRAMUSCULAR | Status: DC

## 2023-11-09 MED ORDER — CEPHALEXIN 250 MG PO CAPS
500.0000 mg | ORAL_CAPSULE | Freq: Four times a day (QID) | ORAL | Status: DC
  Administered 2023-11-10 (×2): 500 mg via ORAL
  Filled 2023-11-09 (×2): qty 2

## 2023-11-09 MED ORDER — FERROUS SULFATE 325 (65 FE) MG PO TABS: 325.0000 mg | ORAL_TABLET | ORAL | Status: DC

## 2023-11-09 MED ORDER — CEFAZOLIN SODIUM-DEXTROSE 2-4 GM/100ML-% IV SOLN
2.0000 g | Freq: Once | INTRAVENOUS | Status: AC
  Administered 2023-11-09: 2 g via INTRAVENOUS

## 2023-11-09 MED ORDER — ALUM & MAG HYDROXIDE-SIMETH 200-200-20 MG/5ML PO SUSP: 30.0000 mL | Freq: Four times a day (QID) | ORAL | Status: DC | PRN

## 2023-11-09 MED ORDER — FLUOXETINE HCL 20 MG PO CAPS
60.0000 mg | ORAL_CAPSULE | Freq: Every day | ORAL | Status: DC
  Administered 2023-11-10: 60 mg via ORAL
  Filled 2023-11-09: qty 3

## 2023-11-09 MED ORDER — OLANZAPINE 10 MG PO TABS
10.0000 mg | ORAL_TABLET | Freq: Every day | ORAL | Status: DC
  Administered 2023-11-09: 10 mg via ORAL
  Filled 2023-11-09: qty 1

## 2023-11-09 MED ORDER — ONDANSETRON HCL 4 MG PO TABS: 4.0000 mg | ORAL_TABLET | Freq: Three times a day (TID) | ORAL | Status: DC | PRN

## 2023-11-09 MED ORDER — ACETAMINOPHEN 325 MG PO TABS: 650.0000 mg | ORAL_TABLET | ORAL | Status: DC | PRN

## 2023-11-09 MED ORDER — LIDOCAINE-EPINEPHRINE 1 %-1:100000 IJ SOLN
10.0000 mL | Freq: Once | INTRAMUSCULAR | Status: AC
  Administered 2023-11-09: 10 mL
  Filled 2023-11-09: qty 1

## 2023-11-09 MED ORDER — LIDOCAINE-EPINEPHRINE (PF) 1 %-1:200000 IJ SOLN
30.0000 mL | Freq: Once | INTRAMUSCULAR | Status: DC
  Filled 2023-11-09: qty 30

## 2023-11-09 MED ORDER — TRAZODONE HCL 50 MG PO TABS: 50.0000 mg | ORAL_TABLET | Freq: Every evening | ORAL | Status: DC | PRN

## 2023-11-09 MED ORDER — IOHEXOL 350 MG/ML SOLN
80.0000 mL | Freq: Once | INTRAVENOUS | Status: AC | PRN
  Administered 2023-11-09: 80 mL via INTRAVENOUS

## 2023-11-09 NOTE — Consult Note (Signed)
 [facility] Consult Note  Patient Name: Vincent Frank MRN: [id number] DOB: 12/31/2000 DATE OF Consult: 11/09/2023  PRIMARY PSYCHIATRIC DIAGNOSES Unspecified depressive disorder; Unspecified anxiety disorder  Based on my current evaluation and assessment of the patient, he attempted suicide after his significant other did not return his calls after not showing up to their date. Patient asserted that his assumption was that she no longer wishes to be with him without making inquiries to her friends/family about her well-being to ascertain whether something happened that would have prevented her from meeting with patient today. Patient describes willfully engaging in self-injurious cutting behaviors that are documented to be numerous with one laceration that did require stitches. 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 Patient denies current suicidal and homicidal intent and is voicing wish to return home where he feels that his family prevents him from engaging in self-harming behaviors. However, as demonstrated today, despite having these psychosocial supports, patient made a willful act to impart harm to self that could have resulted in severe morbidity and mortality. The patient's presentation is consistent with Unspecified depressive disorder; Unspecified anxiety disorder. Therefore, patient does meet criteria for an intensive inpatient psychiatric hospitalization.  RECOMMENDATIONS   Inpatient psychiatric admission recommended? YES. Patient is at high risk to self at this time   Medication recommendations:  Risks, benefits, side effects and alternatives to treatments reviewed:  -Continue home psychotropic regimen (recommend performing a medication reconciliation with patient's pharmacy to ascertain accuracy of reported regimen)  As needed  medications to manage patient's acute symptoms while in hospital care:  -Maximize utilization of verbal de-escalation techniques, if attempts are unsuccessful and patient poses a threat to self and others: Consider olanzapine (Zyprexa) 5 mg to 10 mg PO/IM with diphenhydramine 25 mg to 50 mg PO/IM every 6 hours as needed for severe agitation. Would offer patient the option of taking PO medication first, but if patient refuses then may administer IM medication as a last resort. Would not exceed 20 mg of olanzapine within a 24-hour period. Avoid co-administering intramuscular olanzapine with intravenous benzodiazepine, as giving both medications concurrently is associated with respiratory depression.   Non-Medication recommendations:  -Please obtain EKG to guide psychotropic management. Note: Please stop all antipsychotic and QTc prolonging medications if patient's QTc is greater than 480 ms. Of note, to decrease the risk of prolonged QTc, please maintain potassium and magnesium levels within normal ranges. -Agree with work up for organic causes of altered mentation and mood dysregulation, consider the following if not already performed: CT of the head, CBC and differential, basic metabolic profile, liver function tests (if abnormal consider ammonia level), urinalysis, urine toxicology screen, vitamin B12 level, vitamin D level, TSH with reflex free T4  Observation recommendations:  per unit protocol for monitoring suicidal patient   Follow-Up Telepsychiatry C/L services: We will continue to follow this patient with you until stabilized or discharged.  If you have any questions or concerns, please call our TeleCare Coordination service at  [phone] and ask for myself or the provider on-call.  Communication: Treatment team members (and family members if applicable) who were involved in treatment/care discussions and planning, and with whom we spoke or engaged with via secure text/chat, include the  following: primary team  Thank you for involving [place] in the care of this patient. If you have any additional questions or concerns, please  call [phone] and ask for me or the provider on-call.  Total time spent in this encounter was 60 minutes with greater than 50% of time spent in counseling and coordination of care.  TELEPSYCHIATRY ATTESTATION & CONSENT  As the provider for this telehealth consult, I attest that I verified the patient's identity using two separate identifiers, introduced myself to the patient, provided my credentials, disclosed my location, and performed this encounter via a HIPAA-compliant, real-time, face-to-face, two-way, interactive audio and video platform and with the full consent and agreement of the patient (or guardian as applicable.)  Patient physical location: [facility] at [facility] Telehealth provider physical location: home office in state of [place].  Video start time: 2230 (Central Time) Video end time: 2250 (Central Time)  IDENTIFYING DATA  Vincent Frank is a 23 y.o. year-old male for whom a psychiatric consultation has been ordered by the primary provider. The patient was identified using two separate identifiers.  CHIEF COMPLAINT/REASON FOR CONSULT  Self-injurious cutting   HISTORY OF PRESENT ILLNESS (HPI)  I evaluated the patient today face-to-face via secure, HIPAA-compliant telepsychiatric connection, and at the request of the primary treatment team. The reason for the telepsychiatric consultation is that the patient is a 23 year old male who presents for psychiatric evaluation following suicide attempt wherein patient ".reportedly went out into the woods and cut his left forearm with a rescue knife.". Patient was ".noted to be cool and clammy with a heart rate of 140 and blood pressure of 100/40." with ".active bleeding noted.". Patient did require sutures to repair one of the self-inflicted lacerations. Primary team is  seeking psychotropic medication recommendations, safety evaluation to determine appropriateness for more intensive psychiatric services and diagnostic clarity as to the patient's presentation.   During one-on-one evaluation with this provider, patient was alert and oriented to self and to location and situation. The patient did not appear to be inappropriately internally preoccupied; patient's thought process was linear and organized. He reported that he has been in a romantic relationship with a male that has become increasingly "lackluster" over the past several months. He has recognized that she has become increasingly distant and less willing to spend time with him. The last time that they interacted was 2 days ago and nothing in particular appeared to be amiss. As such, it was a significant surprise to patient when she did not show up to the date that they had arranged today. The patient attempted to reach out to her; however, she did not reply back. Patient did not inquire about her health or well-being to others that knew her; instead, he came to the conclusion that she was purposefully avoiding him and no longer wishes to be with him. These thoughts devastated him; he asserts that it is his hope to "work it out" with her and salvage the relationship. Patient walked for about 2 miles and then came upon a park and cut himself. He reported that he "did not know what I was doing" when he had caused the self-inflicted wounds. However, he resented his actions and called first responders himself for help. 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 Of note, patient did not attempt any intervention himself to help stop the bleeding prior to first responders arriving to the scene. He reported that it was only until they had arrived that a tourniquet was finally applied  to his arm. 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 He denies current suicidal and homicidal intent; he is future oriented to return home given that he wishes to return to his support system as they help prevent him doing "things that I shouldn't do.".   PAST PSYCHIATRIC HISTORY  Inpatient psychiatric treatment: per patient, yes  Outpatient mental health treatment: per patient, he is established with twice weekly psychotherapy and he is established with psychiatric provider. Patient recently ended a course of electroconvulsive therapy about 3 weeks ago - he asserted that he experienced good results from this treatment  Current home psychotropic medications: per patient, he is compliant with his prescribed fluoxetine, olanzapine and lamotrigine  Suicide attempts: per chart documentation, yes  Trauma history: patient did not assert further concerns for trauma/exploitation beyond asserted in the HPI Otherwise as per HPI above.  PAST MEDICAL HISTORY  Past Medical History:  Diagnosis Date   Anxiety    Depression      HOME MEDICATIONS  Facility Ordered Medications  Medication   [COMPLETED] ceFAZolin (ANCEF) IVPB 2g/100 mL premix   [COMPLETED] iohexol (OMNIPAQUE) 350 MG/ML injection 80 mL   [COMPLETED] lidocaine-EPINEPHrine (XYLOCAINE W/EPI) 1 %-1:100000 (with pres) injection 10 mL   acetaminophen (TYLENOL) tablet 650 mg   ondansetron (ZOFRAN) tablet 4 mg   alum & mag hydroxide-simeth (MAALOX/MYLANTA) 200-200-20 MG/5ML suspension 30 mL   [START ON 11/10/2023] cephALEXin (KEFLEX) capsule 500 mg   [START ON 11/10/2023] fenofibrate tablet 160 mg   [START ON 11/11/2023] ferrous sulfate tablet 325 mg   [START ON 11/10/2023] FLUoxetine (PROZAC) capsule 60 mg   OLANZapine (ZYPREXA) tablet 10 mg   traZODone (DESYREL) tablet 50 mg   PTA Medications  Medication Sig   ferrous sulfate 325 (65  FE) MG EC tablet Take 325 mg by mouth See admin instructions. Take one tablet by mouth on Sunday, Tuesdays, Thursdays and Saturdays per farther and patient   fenofibrate (TRICOR) 145 MG tablet Take 145 mg by mouth daily.   OLANZapine (ZYPREXA) 10 MG tablet Take 10 mg by mouth at bedtime.   traZODone (DESYREL) 50 MG tablet Take 50 mg by mouth at bedtime as needed for sleep.   FLUoxetine (PROZAC) 20 MG capsule Take 20 mg by mouth daily.     ALLERGIES  Allergies  Allergen Reactions   Beeswax Swelling   Haldol [Haloperidol]     unknown   Peanut (Diagnostic)     unknown    SOCIAL & SUBSTANCE USE HISTORY  Social History   Socioeconomic History   Marital status: Single    Spouse name: Not on file   Number of children: Not on file   Years of education: Not on file   Highest education level: Not on file  Occupational History   Not on file  Tobacco Use   Smoking status: Not on file   Smokeless tobacco: Not on file  Substance and Sexual Activity   Alcohol use: Not Currently   Drug use: Not Currently   Sexual activity: Not on file  Other Topics Concern   Not on file  Social History Narrative   Not on file   Social Drivers of Health   Financial Resource Strain: Not on file  Food Insecurity: Not on file  Transportation Needs: Not on file  Physical Activity: Not on file  Stress: Not on file  Social  Connections: Not on file   Social History   Tobacco Use  Smoking Status Not on file  Smokeless Tobacco Not on file   Social History   Substance and Sexual Activity  Alcohol Use Not Currently   Social History   Substance and Sexual Activity  Drug Use Not Currently    Additional pertinent information none disclosed.  FAMILY HISTORY  History reviewed. No pertinent family history. Family Psychiatric History (if known):  none disclosed  MENTAL STATUS EXAM (MSE)  Mental Status Exam: General Appearance: Fairly Groomed  Orientation:  Full (Time, Place, and Person)  Memory:   Immediate;   Good Recent;   Good Remote;   Good  Concentration:  Concentration: Fair and Attention Span: Fair  Recall:  Good  Attention  Good  Eye Contact:  Good  Speech:  Clear and Coherent  Language:  Good  Volume:  Normal  Mood: "fine"  Affect:  Congruent  Thought Process:  Goal Directed  Thought Content:  Logical  Suicidal Thoughts:  No  Homicidal Thoughts:  No  Judgement:  Poor  Insight:  Shallow  Psychomotor Activity:  Normal  Akathisia:  No  Fund of Knowledge:  Fair    Assets:  Desire for Improvement Social Support  Cognition:  WNL  ADL's:  Intact  AIMS (if indicated):       VITALS  Blood pressure (!) 119/51, pulse (!) 107, temperature 97.9 F (36.6 C), temperature source Temporal, resp. rate 13, height 5\' 3"  (1.6 m), weight 108.9 kg, SpO2 98%.  LABS  Admission on 11/09/2023  Component Date Value Ref Range Status   Sodium 11/09/2023 138  135 - 145 mmol/L Final   Potassium 11/09/2023 3.9  3.5 - 5.1 mmol/L Final   Chloride 11/09/2023 110  98 - 111 mmol/L Final   CO2 11/09/2023 18 (L)  22 - 32 mmol/L Final   Glucose, Bld 11/09/2023 99  70 - 99 mg/dL Final   Glucose reference range applies only to samples taken after fasting for at least 8 hours.   BUN 11/09/2023 14  6 - 20 mg/dL Final   Creatinine, Ser 11/09/2023 1.32 (H)  0.44 - 1.00 mg/dL Final   Calcium 95/62/1308 9.2  8.9 - 10.3 mg/dL Final   Total Protein 65/78/4696 6.8  6.5 - 8.1 g/dL Final   Albumin 29/52/8413 4.1  3.5 - 5.0 g/dL Final   AST 24/40/1027 29  15 - 41 U/L Final   ALT 11/09/2023 36  0 - 44 U/L Final   Alkaline Phosphatase 11/09/2023 54  38 - 126 U/L Final   Total Bilirubin 11/09/2023 0.4  0.0 - 1.2 mg/dL Final   GFR, Estimated 11/09/2023 59 (L)  >60 mL/min Final   Comment: (NOTE) Calculated using the CKD-EPI Creatinine Equation (2021)    Anion gap 11/09/2023 10  5 - 15 Final   Performed at [facility], [address], [place], [place] [address]   Sodium 11/09/2023 141  135 - 145  mmol/L Final   Potassium 11/09/2023 3.9  3.5 - 5.1 mmol/L Final   Chloride 11/09/2023 110  98 - 111 mmol/L Final   BUN 11/09/2023 15  6 - 20 mg/dL Final   Creatinine, Ser 11/09/2023 1.40 (H)  0.44 - 1.00 mg/dL Final   Glucose, Bld 44/09/4740 97  70 - 99 mg/dL Final   Glucose reference range applies only to samples taken after fasting for at least 8 hours.   Calcium, Ion 11/09/2023 1.16  1.15 - 1.40 mmol/L  Final   TCO2 11/09/2023 20 (L)  22 - 32 mmol/L Final   Hemoglobin 11/09/2023 11.2 (L)  12.0 - 15.0 g/dL Final   HCT 16/04/9603 33.0 (L)  36.0 - 46.0 % Final   WBC 11/09/2023 11.6 (H)  4.0 - 10.5 K/uL Final   RBC 11/09/2023 4.80  3.87 - 5.11 MIL/uL Final   Hemoglobin 11/09/2023 11.7 (L)  12.0 - 15.0 g/dL Final   HCT 54/03/8118 36.3  36.0 - 46.0 % Final   MCV 11/09/2023 75.6 (L)  80.0 - 100.0 fL Final   MCH 11/09/2023 24.4 (L)  26.0 - 34.0 pg Final   MCHC 11/09/2023 32.2  30.0 - 36.0 g/dL Final   RDW 14/78/2956 14.6  11.5 - 15.5 % Final   Platelets 11/09/2023 240  150 - 400 K/uL Final   nRBC 11/09/2023 0.0  0.0 - 0.2 % Final   Performed at [facility], [address], [place], [place] [address]   Alcohol, Ethyl (B) 11/09/2023 <10  <10 mg/dL Final   Comment: (NOTE) Lowest detectable limit for serum alcohol is 10 mg/dL.  For medical purposes only. Performed at [facility], [address] [place], [place] [address]    Lactic Acid, Venous 11/09/2023 1.5  0.5 - 1.9 mmol/L Final   Prothrombin Time 11/09/2023 14.6  11.4 - 15.2 seconds Final   INR 11/09/2023 1.1  0.8 - 1.2 Final   Comment: (NOTE) INR goal varies based on device and disease states. Performed at [facility], [address] [place], [place] [address]    Blood Bank Specimen 11/09/2023 SAMPLE AVAILABLE FOR TESTING   Final   Sample Expiration 11/09/2023    Final                   Value:11/12/2023,2359 Performed at [facility], [address], [place], [place] [address]     PSYCHIATRIC REVIEW  OF SYSTEMS (ROS)  ROS: Notable for the following relevant positive findings: ROS  Additional findings:      Musculoskeletal: No abnormal movements observed      Gait & Station: Normal      Pain Screening: Present - mild to moderate      Nutrition & Dental Concerns: none described  RISK FORMULATION/ASSESSMENT  Is the patient experiencing any suicidal or homicidal ideations: Yes       Explain if yes: patient with self-cutting behaviors following feeling abandoned by significant other without any attempt to stave off/lessen bleeding concerning for suicide attempt despite patient's refusal to assert this Protective factors considered for safety management: Current care in a highly monitored health care setting  Risk factors/concerns considered for safety management:  Prior attempt Depression Recent loss Access to lethal means Impulsivity Male gender Unmarried  Is there a safety management plan with the patient and treatment team to minimize risk factors and promote protective factors: Yes           Explain: psychiatric hospitalization  Is crisis care placement or psychiatric hospitalization recommended: Yes     Based on my current evaluation and risk assessment, patient is determined at this time to be at:  High risk  *RISK ASSESSMENT Risk assessment is a dynamic process; it is possible that this patient's condition, and risk level, may change. This should be re-evaluated and managed over time as appropriate. Please re-consult psychiatric consult services if additional assistance is needed in terms of risk assessment and management. If your team decides to discharge this  patient, please advise the patient how to best access emergency psychiatric services, or to call 911, if their condition worsens or they feel unsafe in any way.   Levora Reas, MD Telepsychiatry Consult Services

## 2023-11-10 ENCOUNTER — Encounter (HOSPITAL_COMMUNITY): Payer: Self-pay

## 2023-11-10 ENCOUNTER — Inpatient Hospital Stay (HOSPITAL_COMMUNITY): Admission: AD | Admit: 2023-11-10 | Source: Intra-hospital | Admitting: Psychiatry

## 2023-11-10 LAB — URINALYSIS, ROUTINE W REFLEX MICROSCOPIC
Bilirubin Urine: NEGATIVE
Glucose, UA: NEGATIVE mg/dL
Hgb urine dipstick: NEGATIVE
Ketones, ur: NEGATIVE mg/dL
Leukocytes,Ua: NEGATIVE
Nitrite: NEGATIVE
Protein, ur: NEGATIVE mg/dL
Specific Gravity, Urine: 1.02 (ref 1.005–1.030)
pH: 6 (ref 5.0–8.0)

## 2023-11-10 LAB — RAPID URINE DRUG SCREEN, HOSP PERFORMED
Amphetamines: NOT DETECTED
Barbiturates: NOT DETECTED
Benzodiazepines: NOT DETECTED
Cocaine: NOT DETECTED
Opiates: NOT DETECTED
Tetrahydrocannabinol: NOT DETECTED

## 2023-11-10 MED ORDER — BACITRACIN ZINC 500 UNIT/GM EX OINT: TOPICAL_OINTMENT | Freq: Two times a day (BID) | CUTANEOUS | Status: DC

## 2023-11-10 MED ORDER — TAMSULOSIN HCL 0.4 MG PO CAPS
0.4000 mg | ORAL_CAPSULE | Freq: Every day | ORAL | Status: DC
Start: 2023-11-10 — End: 2023-11-10
  Administered 2023-11-10: 0.4 mg via ORAL
  Filled 2023-11-10: qty 1

## 2023-11-10 NOTE — ED Provider Notes (Addendum)
 9:47 AM  I evaluated the patients arm lacerations.  Clean dry and intact.  No redness or drainage.   I recommended an ointment applied twice a day.  Can get wet but no scrubbing the area.  Sutures to be removed between day 7-10. Can be done here at urgent care or at a family doctors office. Return for redness, drainage.   This is something that does not need specialized wound care and typically could be managed by the patient at home.    Patient has been accepted.  Transport is here to take him.  He remains clinically stable for transport.     Albertus Hughs, DO 11/10/23 1500

## 2023-11-10 NOTE — Progress Notes (Signed)
 Progress Note     Subjective: Pt denies significant pain, numbness, tingling, weakness.   Objective: Vital signs in last 24 hours: Temp:  [97.8 F (36.6 C)-97.9 F (36.6 C)] 97.8 F (36.6 C) (04/16 0500) Pulse Rate:  [82-107] 82 (04/16 0500) Resp:  [13-18] 15 (04/16 0500) BP: (118-145)/(51-78) 145/68 (04/16 0500) SpO2:  [97 %-99 %] 99 % (04/16 0500) Weight:  [108.9 kg] 108.9 kg (04/15 1849)    Intake/Output from previous day: No intake/output data recorded. Intake/Output this shift: No intake/output data recorded.  PE: General: pleasant, WD, WN male who is laying in bed in NAD Heart: regular, rate, and rhythm.  Palpable radial and ulnar pulses bilaterally  Lungs: Respiratory effort nonlabored Skin: lacerations to BL forearms with sutures C/D/I, more superficial lacerations clean without active bleeding  Psych: A&Ox3 with a flat affect.    Lab Results:  Recent Labs    11/09/23 1838 11/09/23 1844  WBC 11.6*  --   HGB 11.7* 11.2*  HCT 36.3* 33.0*  PLT 240  --    BMET Recent Labs    11/09/23 1838 11/09/23 1844  NA 138 141  K 3.9 3.9  CL 110 110  CO2 18*  --   GLUCOSE 99 97  BUN 14 15  CREATININE 1.32* 1.40*  CALCIUM 9.2  --    PT/INR Recent Labs    11/09/23 1838  LABPROT 14.6  INR 1.1   CMP     Component Value Date/Time   NA 141 11/09/2023 1844   K 3.9 11/09/2023 1844   CL 110 11/09/2023 1844   CO2 18 (L) 11/09/2023 1838   GLUCOSE 97 11/09/2023 1844   BUN 15 11/09/2023 1844   CREATININE 1.40 (H) 11/09/2023 1844   CALCIUM 9.2 11/09/2023 1838   PROT 6.8 11/09/2023 1838   ALBUMIN 4.1 11/09/2023 1838   AST 29 11/09/2023 1838   ALT 36 11/09/2023 1838   ALKPHOS 54 11/09/2023 1838   BILITOT 0.4 11/09/2023 1838   GFRNONAA 59 (L) 11/09/2023 1838   Lipase  No results found for: "LIPASE"     Studies/Results: CT CHEST ABDOMEN PELVIS W CONTRAST Result Date: 11/09/2023 CLINICAL DATA:  Polytrauma, blunt. EXAM: CT CHEST, ABDOMEN, AND PELVIS  WITH CONTRAST TECHNIQUE: Multidetector CT imaging of the chest, abdomen and pelvis was performed following the standard protocol during bolus administration of intravenous contrast. RADIATION DOSE REDUCTION: This exam was performed according to the departmental dose-optimization program which includes automated exposure control, adjustment of the mA and/or kV according to patient size and/or use of iterative reconstruction technique. CONTRAST:  80mL OMNIPAQUE IOHEXOL 350 MG/ML SOLN COMPARISON:  None Available. FINDINGS: CT CHEST FINDINGS Cardiovascular: Heart is normal size. Aorta is normal caliber. Mediastinum/Nodes: No mediastinal, hilar, or axillary adenopathy. Trachea and esophagus are unremarkable. Thyroid unremarkable. Soft tissue in the anterior mediastinum felt to reflect residual thymus. Lungs/Pleura: Minimal dependent atelectasis in the lower lobes. No effusions or pneumothorax. Musculoskeletal: Chest wall soft tissues are unremarkable. No acute bony abnormality. CT ABDOMEN PELVIS FINDINGS Hepatobiliary: No hepatic injury or perihepatic hematoma. Gallbladder is unremarkable. Pancreas: No focal abnormality or ductal dilatation. Spleen: No splenic injury or perisplenic hematoma. Adrenals/Urinary Tract: No adrenal hemorrhage or renal injury identified. Bladder is unremarkable. Stomach/Bowel: Stomach, large and small bowel grossly unremarkable. Normal appendix. Vascular/Lymphatic: No evidence of aneurysm or adenopathy. Reproductive: No visible focal abnormality. Other: No free fluid or free air. Musculoskeletal: No acute bony abnormality. IMPRESSION: No acute findings or significant traumatic injury in the chest, abdomen or  pelvis. Minimal dependent atelectasis in the lower lobes. Electronically Signed   By: Charlett Nose M.D.   On: 11/09/2023 19:35   CT CERVICAL SPINE WO CONTRAST Result Date: 11/09/2023 CLINICAL DATA:  Polytrauma, blunt. Suicide attempt. Arm lacerations. EXAM: CT CERVICAL SPINE WITHOUT  CONTRAST TECHNIQUE: Multidetector CT imaging of the cervical spine was performed without intravenous contrast. Multiplanar CT image reconstructions were also generated. RADIATION DOSE REDUCTION: This exam was performed according to the departmental dose-optimization program which includes automated exposure control, adjustment of the mA and/or kV according to patient size and/or use of iterative reconstruction technique. COMPARISON:  None Available. FINDINGS: Alignment: Normal Skull base and vertebrae: No acute fracture. No primary bone lesion or focal pathologic process. Soft tissues and spinal canal: No prevertebral fluid or swelling. No visible canal hematoma. Disc levels:  Normal Upper chest: Negative Other: None IMPRESSION: Normal study. Electronically Signed   By: Charlett Nose M.D.   On: 11/09/2023 19:32   CT ANGIO UP EXTREM LEFT W &/OR WO CONTAST Result Date: 11/09/2023 CLINICAL DATA:  Forearm trauma. Suicide attempt. Multiple forearm and wrist lacerations. EXAM: CT ANGIOGRAPHY OF THE LEFT UPPER EXTREMITY TECHNIQUE: Multidetector CT imaging of the left upper extremity was performed using the standard protocol during bolus administration of intravenous contrast. Multiplanar CT image reconstructions and MIPs were obtained to evaluate the vascular anatomy. RADIATION DOSE REDUCTION: This exam was performed according to the departmental dose-optimization program which includes automated exposure control, adjustment of the mA and/or kV according to patient size and/or use of iterative reconstruction technique. CONTRAST:  80mL OMNIPAQUE IOHEXOL 350 MG/ML SOLN COMPARISON:  None Available. FINDINGS: No evidence of arterial injury in the left upper extremity. Early takeoff of the radial artery from the mid upper arm brachial artery. This vessel appears intact into the distal forearm. Vessels are unopacified at the left wrist. No significant hematoma. Stranding within the soft tissues in the mid and distal left  forearm. No acute bony abnormality. Review of the MIP images confirms the above findings. IMPRESSION: Left upper extremity arterial structures are patent and unremarkable to the level of the distal forearm/wrist where the vessels are unopacified likely related to timing. No. It is difficult to completely exclude radial artery injury at the level of the wrist due to timing. Stranding in the subcutaneous soft tissues without significant hematoma. Electronically Signed   By: Charlett Nose M.D.   On: 11/09/2023 19:31   CT HEAD WO CONTRAST Result Date: 11/09/2023 CLINICAL DATA:  Head trauma, moderate-severe EXAM: CT HEAD WITHOUT CONTRAST TECHNIQUE: Contiguous axial images were obtained from the base of the skull through the vertex without intravenous contrast. RADIATION DOSE REDUCTION: This exam was performed according to the departmental dose-optimization program which includes automated exposure control, adjustment of the mA and/or kV according to patient size and/or use of iterative reconstruction technique. COMPARISON:  None Available. FINDINGS: Brain: No acute intracranial abnormality. Specifically, no hemorrhage, hydrocephalus, mass lesion, acute infarction, or significant intracranial injury. Vascular: No hyperdense vessel or unexpected calcification. Skull: No acute calvarial abnormality. Sinuses/Orbits: No acute findings Other: None IMPRESSION: No acute intracranial abnormality. Electronically Signed   By: Charlett Nose M.D.   On: 11/09/2023 19:24    Anti-infectives: Anti-infectives (From admission, onward)    Start     Dose/Rate Route Frequency Ordered Stop   11/10/23 0300  cephALEXin (KEFLEX) capsule 500 mg        500 mg Oral Every 6 hours 11/09/23 2059 11/15/23 0559   11/09/23 1900  ceFAZolin (ANCEF)  IVPB 2g/100 mL premix        2 g 200 mL/hr over 30 Minutes Intravenous  Once 11/09/23 1846 11/09/23 1935        Assessment/Plan  Self-inflicted lacerations to bilateral forearms - deeper  lacerations repaired with sutures yesterday evening - ok to shower and allow soap and water to cleanse wounds - ok to use abx ointment daily  - will schedule RN visit in 10-14 days for suture removal - if patient is not local then ok to remove at facility in 10-14 days or at urgent care/PCP office - clear for DC to inpatient psych from trauma standpoint    LOS: 0 days   I reviewed ED provider notes, last 24 h vitals and pain scores, last 48 h intake and output, last 24 h labs and trends, and last 24 h imaging results.  This care required moderate level of medical decision making.    Annetta Killian, [facility] Surgery 11/10/2023, 9:43 AM Please see Amion for pager number during day hours 7:00am-4:30pm

## 2023-11-10 NOTE — ED Notes (Signed)
 Report was given to Armond Bertin, RN at 12:11 at [facility]

## 2023-11-10 NOTE — Progress Notes (Addendum)
 Per Archibald Beard, pt has been accepted to [facility] on 11/10/2023  Accepting Physician will be:  Lavona Pounds, MD Report can be called to: [phone], Option 2 (Please leave a msg and a nurse will call you back) Pt can arrive at any time today Care Team Notified:  Volanda Gruber, NP, Hendrick Locke, RN    [place], [facility]

## 2023-11-10 NOTE — Progress Notes (Signed)
 Patient was referred to the following facilities:  Destination  Service Provider Address Phone [facility] [address] [place] [address] [phone]  [facility] [address], [place] [address] [phone]  [facility] [address], [place] [address] [phone]  [facility] [address], [place] [address] [phone]  [facility] [address] [place] [address] [phone]  [facility] [address] [place] [address] [phone]  [facility] [address] [place], [place] [address] [phone]  Internal Comment last updated by Mauri Sous 11/10/2023 [address] [facility] [phone]

## 2023-12-02 NOTE — ED Notes (Signed)
 Pt noted to be shaking and crying and having brief episodes of holding his breath. While attempting to calm patient and inquiring about what occurred this evening, pt stated I keep jumping off things and I just can't die. Powell MD in room at time of conversation. Pt then stated I couldn't save him. I deserve to die. This RN continued to calm patient. Kara CNA at bedside to sit with patient. House Supervisor notified of SI statements at this time.    Burnis Clara [place], [place] 12/02/23 [phone]

## 2023-12-02 NOTE — ED Notes (Signed)
 Pt resting on stretcher at this time with NADN. Rise and fall of chest noted. Bed locked and in lowest position with call light within reach. 1:1 observation at bedside. Cardiac monitoring still in place due to medical necessity.     Vincent Frank [place], [place] 12/02/23 [phone]

## 2023-12-02 NOTE — ED Notes (Signed)
 Consuelo, NP with Neurosurgery at bedside, along with RN and 1:1 PSC.   Donette Morene DASEN, RN 12/02/23 [phone]

## 2023-12-02 NOTE — ED Notes (Signed)
 Trauma at bedside at this time. Pt states he is unable to urinate on his own. Bladder scan complete with 330cc noted.    Dasie Bring [place], [place] 12/02/23 [phone]

## 2023-12-02 NOTE — H&P (Signed)
 [facility]   Trauma History and Physical Exam  Name:  Vincent Frank  MRN:  [id number]  Date of Birth:  07/27/1896 Chart ID:  [id number]  Admission Date:  12/02/2023 Time:  4:46 AM  Attending:  12/02/2023 TR01/TR01    Date:  12/02/2023    Time:  4:46 AM Attending:  Roche, Vincent F, MD  Service:  [facility] TRAUMA/ACUTE CARE SURGERY   TRAUMA ACTIVATION: BRAVO  Chief Complaint: Presentation for evaluation of Trauma following: fall from unknown height  HISTORY AND PHYSICAL   HISTORY OF PRESENT ILLNESS Time of Injury: 1800 hours  HPI: 23 year old male [place]. Patient reportedly crawled for some ways and then was able to call for help.   Onset Rate:  sudden Timing:  Acute Progression:  Constant Occurred at:  While hiking  Pain Severity   pain is excruciating , incapacitating and unbearable(9-10 pain scale) Pain Location   Back Associated Symptoms: none   Possible Aspiration:    no Hemodynamically Stable During Transport: no+ Loss of Consiusness:    uncertain  Occupant Position: NA Restraints: N/A  Pre-hospital Interventions: C-collar: present Spine board: absent Splint: None Pelvic stabilizer: absent  Other:  None   HISTORY:   All  noted HPI, PFSH, and ROS items unless answered below unable to obtain due to patient condtion  HISTORY (GENERAL):     PAST MEDICAL HISTORY:  No past medical history on file. PAST SURGICAL HISTORY:  No past surgical history on file.   SOCIAL HISTORY:  Social History   Socioeconomic History  . Marital status: Single    Spouse name: Not on file  . Number of children: Not on file  . Years of education: Not on file  . Highest education level: Not on file  Occupational History  . Not on file  Tobacco Use  . Smoking status: Not on file  . Smokeless tobacco: Not on file  Substance and Sexual Activity  . Alcohol use: Not on file  . Drug use: Not on file  . Sexual activity:  Not on file  Other Topics Concern  . Not on file  Social History Narrative  . Not on file   Social Drivers of Health   Financial Strain: Not on file  Food Insecurity: Not on file  Transportation: Not on file  Physical Activity: Not on file  Social Connections: Not on file    FAMILY HISTORY: Unable to obtain  No family history on file.  PROBLEM LIST:  Active Problems:   * No active hospital problems. *   Medications: Home:   Prior to Admission medications   Not on File     Allergies:   Not on File    FAST All views negative  REVIEW OF SYSTEMS:  Review of Systems Constitutional: negative Eyes: negative HEENT: negative Respiratory: negative CV: negative GI: negative GU: negative Musculoskeletal: negative Neuro: negative Skin: negative Psych: negative  Primary Survey: Airway: 02 Ellis Patent Breathing: Normal Circulation:  There were no vitals filed for this visit.  External Bleeding?: None  Pupils: PERRL Glasgow Coma Score Eye opening: 1 - Does not open eyes  Verbal:  1 - Makes no noise  Motor:  1 - No motor response to pain  GCS Total: 3    Secondary Survey: Physical Exam Head: Normocephalic, without obvious abnormality, atraumatic Eyes: PERRL, EOM's intact Ears: normal appearance Nose: normal Mouth: negative Facial bones: facial movement was normal and symmetrical,  nontender Neck: Unable to assess, c-collar in place  Chest: Normal chest wall and respirations. Clear to auscultation. Cardiac: Cor RRR Abdomen: abdomen is soft without significant tenderness, masses, organomegaly or guarding Pelvis: stable Genitalia: normal male genitals, no testicular masses or hernia Rectal exam: normal tone, normal prostate, no masses or tenderness Back exam: no palpable steps offs, no abrasions  LSB Removed: None Present Upper extremities: unable to assess  Lower extremities: unable to assess  Skin: no rashes, no ecchymoses, no petechiae Neurologic:  patient received 2mg  dilaudid  and 50mg  ketamine in route and unresponsive  Psychiatric: unable to assess   NEURO EXAM:  Unable to assess  C-SPINE CLEARED no  SENSATION Unable to assess      Best Level of Intact Motor Function: Unable to assess   RADIOLOGICAL EXAMAMINATION IF DONE    LABS:    TODAY'S CURRENT DIAGNOSIS: Active Problems:   * No active hospital problems. *   ACTIVE CRITICAL CARE DIAGNOSIS:  Active Diagnosis:  Trauma, fall from height  OVERALL ASSESSMENT/PLAN: Unknown age male reportedly fell while hiking and received 50mg  of Ketamine and 2 mg of dilaudid  for agitation and pain in route. Secondary exam consequently limited.   - Admit to trauma service  - Consult NSGY  - NPO, mIVF  - MMPC  - Will need DVT ppx started after NSGY work up - Consult in patient psych     Consults: (fill in times for all consults)  Service: Physician: Notified by: Time Paged: Time Callback: Time Arrived: STAT Urgent Routine (S,U,R)  Neurosurgery  Vincent Frank      Orthopedics         Plastic Surgery        Other        Other           DISPOSITION: ADMIT TO SICU   Trauma Comorbidities: Major depressive disorder  Vincent GORMAN Monte, MD  Date Of Service: 4:46 AM 12/02/2023 Attending Surgeon: Vincent Roche, MD     ------------------------------------------------------------------------------- Attestation signed by Frank, Vincent F, MD at 12/02/2023  8:13 AM Attending Attestation:   I have seen and evaluated the patient on 12/02/23  I have reviewed the above documentation for accuracy and agree with findings and plan, except for what is noted below:   CC:  reported fall from height  23 year old  male s/p Fall from height brought as Bravo activation.  Per EMS patient was at covers gap and found near roadway and reported a 10-20 foot fall from which he crawled or dragged himself from to nearby the road.  Patient was in significant pain on route and received 2 mg of  Dilaudid  and 50 mg of ketamine such that he was quite sedated on arrival and unable to provide any history.  After patient improved from his sedatives he endorsed that he was amnestic to the event but that he did intent to harm himself and that he had decreased sensation and motor to all 4 extremities  ABC intact   Past medical, surgical, family, social history, ROS, meds and allergies unobtainable due to medical condition  On my exam:  GCS 3 -  received 50 mg of ketamine immediately prior to arriving in the [place], saturating well, maintaining airway Head normocephalic, MMM, pupils reactive C-collar in place Trachea midline No chest wall tenderness Breathing comfortably on room air No abdominal tenderness Pelvis stable  No long bone deformities  Palpable distal pulses  No cervical, thoracic, lumbar or sacral back  step-offs  No motor or sensation appreciated in all 4 extremities  sedated affect Skin  no laceration   FAST: negative  Labs viewed and notable for  white blood cell count 9, hemoglobin 10.6, creatinine 1.27  Imaging independently viewed and notable for  no traumatic injury seen on CT scans, MRIs pending  Principal Problem:   Trauma   Plan: Admit to trauma  ICU  neurosurgery consult  Suicide precautions  Respond consult in the morning Serial labs Home medications DVT ppx when able  TDAP  up-to-date Code status  full code presumed   Dr. Keelin F Roche MD, MPH, FACS [facility] Acute Care Surgery Trauma, Surgical Critical Care and Emergency General Surgery  -------------------------------------------------------------------------------

## 2023-12-02 NOTE — Consults (Signed)
 [facility]  Neurosurgical Consultation  Name:Vincent Frank  DOB:10/12/2000  MRN#: [id number] Age/Sex:23 year old male  Admission Date:12/02/2023  Admitting Provider:Keelin F Roche, MD  Attending Consultant:  Dr. Alm Pryputniewicz    Date of service: 12/02/2023   Chief Complaint:  Thoracolumbar pain   HPI:  This is a 23 year old Caucasian male, who presents to [facility] after reporting a fall while he was out hiking.  He states he is not sure what made him fall.  He states he crawled out of the area where he was hiking and was able to get some help.  He is complaining mostly of thoracolumbar pain.  States he has decreased sensation from about his hips down.  Denies saddle anesthesia.  He has been voiding with a urinal and feels that he is emptying his bladder.  He reports some tenderness of his cervical spine as well.  No paresthesias in his upper extremities.  CT scans of cervical thoracic and lumbar spine were negative.  We have already ordered stat MRIs.  He has a history of prior suicide attempts.  He was seen 06/13/2023 by Dr. Berwyn and the trauma team after a reported hiking accident.  He had no structural lesion found to explain lower extremity paralysis that it was complaining of at that time.  He was discharge to inpatient psychiatry.    History:  No past medical history on file.  No past surgical history on file.  Allergies  Allergen Reactions  . Gabapentin  Anaphylaxis    Current Facility-Administered Medications  Medication Dose Route Frequency Provider Last Rate Last Admin  . acetaminophen  (Tylenol ) tablet 975 mg  975 mg Oral Q6H Powell, Michael S, MD      . lactated ringers  infusion  100 mL/hr Intravenous Continuous Powell, Michael S, MD 100 mL/hr at 12/02/23 0614 100 mL/hr at 12/02/23 9385  . methocarbamol  (Robaxin ) tablet 750 mg  750 mg Oral 4x Daily Roche, Keelin F, MD   750 mg at 12/02/23 0604  . oxyCODONE  (Roxicodone ) immediate  release tablet 10 mg  10 mg Oral Q4H PRN Powell, Michael S, MD   10 mg at 12/02/23 0604  . oxyCODONE  (Roxicodone ) immediate release tablet 5 mg  5 mg Oral Q4H PRN Powell, Michael S, MD      . sodium chloride  0.9 % (NS) flush  3 mL Intravenous PRN Powell, Michael S, MD       And  . sodium chloride  0.9 % (NS) flush  3 mL Intravenous BID Powell, Michael S, MD       No current outpatient medications on file.    Social History   Tobacco Use  . Smoking status: Not on file  . Smokeless tobacco: Not on file  Substance Use Topics  . Alcohol use: Not on file    No family history on file.   Review of Systems:  Constitutional: No fevers, no chills, no diaphoresis, weight change. ENT: No hearing change no ear discharge no nose discharge swallowing problem no ear ringing. Eyes: No eye pain no photophobia no visual disturbance. Respiratory: no cough no shortness of breath no chest tightness. Cardiovascular: No chest pain, no palpitations no leg swelling. GI: No abdominal pain distension nausea vomiting diarrhea. Endocrine: No temperature intolerance no polydipsia no poly urea. GU: No urinary incontinence frequency or genital numbness. Immunology: No known immunocompromise. Neurological: See above. Hematologic: No adenopathy no easy bleeding or bruises. Psychiatric: No depression, agitation hallucination or delusions.   Vitals:   Vitals:  12/02/23 0439 12/02/23 0454 12/02/23 0509 12/02/23 0600  BP: 125/66 116/65 117/69 122/73  Pulse: 71 81 86 72  Resp:      Temp:      TempSrc:      SpO2: 98% 97% 97% 98%  Weight:      Height:        Body mass index is 35.31 kg/m.   Physical Exam:  GEN:  Awake and alert lying on stretcher  GCS:  15  MUSCULOSKELETAL   NECK/SPINE:  Reports tenderness to palpation cervical thoracic and lumbar spine  SI JOINTS:  No exam  GAIT:  No exam  STRENGTH/MOTOR:   Right: Delt 5/Bi 5/BR 5/Tri 5/Int 5/patient denies ability to move any lower extremity muscle group  but he does move leg to painful stimuli   Left:Delt 5/Bi 5/BR 5/Tri 5/Int 5/patient reports inability to move any lower extremity muscle group but he does move his leg to painful stimuli  NEUROLOGIC  ORIENTATION: oriented to self, month, year,place, situation  MEMORY:  Intact to recent and remote  CONCENTRATION: good to examiner  LANGUAGE: receptive/expressive intact  CN 2: PERRL  CN 3,4,6: EOMI  CN 5: Face sensation is intact bilaterally  CN 7: Face motor symmetric bilaterally  CN 8: Hearing grossly intact  CN 9/10:uvula midline palate elevates symmetrically  CN 11: Shrugs intact bilaterally  CN 12: Tongue symmetric, moves symmetric bilaterally  SENSATION: pain/light touch/proprioception intact bilaterally  COORDINATION: good finger to nose bilaterally  DTR's: 1+ throughout, Hoffman's/Babinski/Clonus-negative  Rectal:  Normal rectal tone normal rectal sensation    Labs:   Admission on 12/02/2023  Component Date Value  . WBC 12/02/2023 9.0   . RBC 12/02/2023 4.36   . Hemoglobin 12/02/2023 10.6 (L)   . Hematocrit 12/02/2023 31.1 (L)   . MCV 12/02/2023 71.3 (L)   . MCH 12/02/2023 24.3 (L)   . MCHC 12/02/2023 34.1   . RDW 12/02/2023 15.2 (H)   . Platelet Count 12/02/2023 278   . MPV 12/02/2023 6.5 (L)   . Prothrombin Time 12/02/2023 12.7 (H)   . PT-INR 12/02/2023 1.2   . Sodium 12/02/2023 139   . Potassium 12/02/2023 4.1   . Chloride 12/02/2023 104   . CO2 12/02/2023 22   . Anion Gap 12/02/2023 13.0   . Glucose 12/02/2023 99   . BUN 12/02/2023 19.3   . Creatinine 12/02/2023 1.27 (H)   . Total Protein 12/02/2023 7.6   . Albumin 12/02/2023 4.6   . Bilirubin, Total 12/02/2023 0.3   . ALT 12/02/2023 28   . AST 12/02/2023 32   . Calcium 12/02/2023 9.5   . Alkaline Phosphatase 12/02/2023 72   . Calcium (Corrected For A* 12/02/2023 9.0   . BUN Creatinine Ratio 12/02/2023 15.2   . Calculated Osmolality 12/02/2023 280   . Amylase 12/02/2023 50   . aPTT (Activated Partial  * 12/02/2023 33.5   . ABORH TYPE 12/02/2023 A POSITIVE   . Antibody Screen 12/02/2023 Negative   . Record Check 12/02/2023 NO HISTORY       Diagnostic Imaging:  CT HEAD:  No acute hemorrhage  CT C-SPINE:  No acute fracture  CT T-SPINE:  No acute fracture  CT L-SPINE:  No acute fracture   Assessment:  Thoracolumbar pain, reporting decreased ability to move lower extremities Status post fall while hiking Patient was admitted November 2024 after another fall while hiking with same complaints of lower extremity paralysis and was seen by Dr. Berwyn; no structural cause was identified  patient was transferred to inpatient psychiatry, conversion disorder was in the differential   Plan:  Patient presents again with a fall from hiking reporting lower extremity decreased motor.  He does move legs to painful stimuli and he has normal rectal tone.  CT scans of cervical thoracic and lumbar spine were negative.  Agree with stat MRI cervical thoracic and lumbar spine.  If these are negative would recommend psychiatry consult, possible Neurology consult. Patient just voided in the last hour prior to my exam, we have asked nursing to get a post void residual bladder scan   Time neurosurgery notified of consult:  5:45 a.m. Time of neurosurgical provider response:  6:15 a.m. I have discussed the case with the trauma team and reviewed pertinent imaging. This consultation did not require an immediate discussion with the attending neurosurgeon on call as the patient's GCS score is >12 and there was no evidence of spinal cord injury.    Consuelo Lares FNP  12/02/2023  6:25 AM  MRI reviewed   No structural abnormality  to explain lower extremity weakness.  We will sign off.  Please consult Psychiatry and Neurology.  In the future please do not consult us  as until you find a structural lesion on this patient.SABRA

## 2023-12-02 NOTE — ED Notes (Signed)
 Patient placed in purple gown per protocol.   Vincent Frank [place], [place] 12/02/23 [phone]

## 2023-12-02 NOTE — ED Notes (Signed)
 Pt remains laying on stretcher. This rn at bedside and noticed patient moving bilateral lower extremities without difficulty.    Vincent Frank [place], [place] 12/02/23 [phone]

## 2023-12-02 NOTE — ED Triage Notes (Signed)
 [facility] to [facility] via [facility] following a reportedly fall down 10-20 feet while hiking. Pt reportedly crawled down the trail and was able to call for help. EMS states he was in severe pain and was unable to move his lower extremities. EMS administered 50 ketamine and 2 mg dilaudid  pta. Trauma at bedside for evaluation.

## 2023-12-17 NOTE — Progress Notes (Signed)
 Participant Name: Vincent Frank, Vincent Frank  Preferred Name: [name] Participant MRN: [id number] Participant Age / DOB: 23 year old / September 29, 2000   Today's Date: 12/17/2023   Notes: Unable to reach after 3 attempts, will follow-up in 30 days.  Behavioral Health Navigator Aftercare Assessment BHN Assessment Were there any other [facility] admissions in the past year?: No Aftercare Appointment Site: Other Other Aftercare Appt Site: [facility] Was a PCP appointment scheduled for patient?: No Initial Outreach - Barriers in Contacting Patient: No answer / Voice mailbox not set-up    [employer]

## 2024-05-29 NOTE — H&P (Signed)
 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 -CT head, CTA head and neck negative for acute findings.  ED provider did spoke to neurology on-call in ED who recommended observation overnight and MRI head.  MRI head has been ordered. -Patient will be admitted to intermediate unit with frequent neurologic checks every 4 hourly. -As needed antiemetics. -As needed analgesic  History of seizure disorder: Not on any antiseizure medication -As per chart review it shows up as patient has history of nonepileptic seizure disorder. -He is not currently on any seizure medication.  He reports that he was on levetiracetam  1000 mg twice daily which was discontinued few months back as per discussion with his primary care physician.Reports he does not remember when he has had last seizure activity. -Monitor clinically  Dehydration 2/2 nausea and vomiting -Will start him on gentle hydration -Prn antiemetics  Fluid and electrolyte disorder: Dehydration, present on admission  Plan: Hydrate with IVF and Repeat BMP in AM  Abnormal blood counts: Not present, present on admission Plan: Repeat CBC in AM  Nutritional status: Body mass index is 32.35 kg/m. Obesity Plan: PCP follow up Regular Diet No metal on tray - Plastic Only  Coagulation defect: Not present, present on admission Plan: See plan above  Elevated LFTs/transaminitis due to: Not present, present on admission Plan: Monitor  Debility: N/A, present on admission Plan: See plan above  CODE STATUS: Full code  VTE prophylaxis: SCDs  Discussed plan of care with consulting physician I have discussed the diagnoses and care plan with the patient.   Time spent on this admission: 75 minutes    HISTORY  CC: closed head  injury with LOC  HPI: 23 year old male with past medical history of seizure disorder not on medication anymore-reports was discontinued with discussion with his primary care physician presented to ED with chief complaint of closed head injury today at around 2:30 PM.   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 He reported direct blow over his head while playing rugby. He reports that he was evaluated in the field, he felt well at that time, he went back to work and started vomiting up work and lost consciousness. Denies seizure-like activities or vision abnormality or difficulty breathing or chest pain or abdominal pain or disorientation. He reports that he had multiple episodes of vomiting, reports around 10-15 episodes of vomiting post trauma. Patient also reports photophobia.  He reports he had played rugby in the past and had sustained impact over his head leading to similar symptoms couple of days back following which he was assessed in the hospital.  He was found to have concussion and was discharged, patient reported following previous episode he was feeling fine, went back to his work and played rugby again. At this time it is questionable if he should play rugby again.   ED Findings/ED Interventions: CT head without contrast was negative for acute abnormality.  CTA head and neck negative for any large vessel occlusion.  Chest x-ray negative.  MRI head has been ordered.  ED provider spoke with neurology on-call and ED who recommended MRI head and observation overnight.  Internal medicine was consulted for admission as observation.  Past Medical History:  Diagnosis Date  . Depression   . History of psychogenic nonepileptic seizure   . History of self mutilation    cutting of arms  . Seizures (*)   . Suicidal ideation    History reviewed. No pertinent surgical  history.  Prior to Admission medications  Medication Sig Start Date End Date Taking? Authorizing Provider  ARIPiprazole  (ABILIFY ) 2 mg tablet Take one tablet (2 mg dose) by mouth daily. Patient not taking: Reported on 05/29/2024    Historical Provider, MD  EPINEPHrine  (EPIPEN  2-PAK) 0.3 mg/0.3 mL injection Inject 0.3 mLs (0.3 mg dose) into the skin as needed for Anaphylaxis for up to 2 doses. See package instructions Patient not taking: Reported on 05/29/2024 01/16/23   Glendia CHRISTELLA Jude, MD  fenofibrate  145 MG tablet Take one tablet (145 mg dose) by mouth daily. Patient not taking: Reported on 05/29/2024    Historical Provider, MD  fluoxetine  (PROZAC ) 40 MG capsule Take one capsule (40 mg dose) by mouth daily. Patient not taking: Reported on 05/29/2024    Historical Provider, MD  fluticasone propionate (FLONASE) 50 mcg/actuation nasal spray 1 spray by Nasal route daily. Patient not taking: Reported on 05/15/2023    Historical Provider, MD  ondansetron  (ZOFRAN -ODT) 4 mg disintegrating tablet Take one tablet (4 mg dose) by mouth every 8 (eight) hours as needed for up to 7 days. Patient not taking: Reported on 05/29/2024 05/28/24 06/04/24  Almarie Cross, MD  traZODone  (DESYREL ) 50 mg tablet Take one tablet (50 mg dose) by mouth at bedtime as needed for Sleep. Patient not taking: Reported on 05/29/2024    Historical Provider, MD     Allergies: I reviewed patient allergy list.  Allergies[1]   Short Social History[2] Full Code  Health Care POA/Medical Decision Maker:  No family history on file.     ROS: Positive per HPI otherwise all other systems performed and negative     EXAM  Temp:  [98.3 F (36.8 C)-98.5 F (36.9 C)] 98.3 F (36.8 C) Heart Rate:  [76-84] 76 Resp:  [16-18] 18 BP: (136-156)/(74-85) 136/74 SpO2:  [98 %-100 %] 100 %     Physical Exam HEENT:  Normocephalic atraumatic. Pupils relatively equal and reactive to light. Vision and hearing grossly normal. Sclerae  nonicteric. Nares are clear. Oropharynx is moist without lesions or exudate NECK: No meningismus. No thyromegaly. Supple LUNGS: Clear to auscultation without accessory muscle use CARDIOVASCULAR: RRR  ABDOMEN:  Positive bowel sounds. Soft. Nontender nondistended.   EXTREMITIES: No clubbing cyanosis or edema. 2+ pulses NEUROLOGIC:  Alert and oriented 3. Strength and sensation appear to be grossly symmetric, Photophobia present SKIN:  No acute rashes or lesions. Good skin turgor.   RESULTS   Recent Results (from the past 24 hours)  CBC And Differential   Collection Time: 05/29/24  4:03 PM  Result Value Ref Range   WBC 7.9 4.0 - 10.5 thou/mcL   RBC 5.35 4.63 - 6.08 million/mcL   HGB 12.6 (L) 13.7 - 17.5 gm/dL   HCT 59.6 59.8 - 48.9 %   MCV 75.3 (L) 79.0 - 92.2 fL   MCH 23.6 (L) 25.7 - 32.2 pg   MCHC 31.3 (L) 32.3 - 36.5 gm/dL   Plt Ct 756 849 - 599 thou/mcL   RDW SD 40.3 35.1 - 46.3 fL   MPV 9.3 (L) 9.4 - 12.4 fL   NRBC% 0.0 0.0 - 0.2 /100WBC   Absolute NRBC Count 0.00 0.00 - 0.01 thou/mcL   NEUTROPHIL %  69.8 %   LYMPHOCYTE % 21.1 %   MONOCYTE % 7.3 %   Eosinophil % 1.1 %   BASOPHIL % 0.4 %   IG% 0.3 %   ABSOLUTE NEUTROPHIL COUNT 5.52 1.50 - 7.50 thou/mcL   ABSOLUTE LYMPHOCYTE COUNT 1.67 1.00 - 4.50 thou/mcL   Absolute Monocyte Count 0.58 0.10 - 0.80 thou/mcL   Absolute Eosinophil Count 0.09 0.00 - 0.50 thou/mcL   Absolute Basophil Count 0.03 0.00 - 0.20 thou/mcL   Absolute Immature Granulocyte Count 0.02 0.00 - 0.03 thou/mcL   Comprehensive Metabolic Panel   Collection Time: 05/29/24  4:03 PM  Result Value Ref Range   Na 143 136 - 146 mmol/L   Potassium 4.2 3.7 - 5.4 mmol/L   Cl 109 (H) 97 - 108 mmol/L   CO2 22 20 - 32 mmol/L   AGAP 12 7 - 16 mmol/L   Glucose 97 65 - 99 mg/dL   BUN 13 6 - 20 mg/dL   Creatinine 8.96 9.23 - 1.27 mg/dL   Ca 9.4 8.7 - 89.7 mg/dL   ALK PHOS 883 25 - 849 U/L   T Bili 0.4 0.0 - 1.2 mg/dL   Total Protein 7.5 6.0 - 8.5 gm/dL   Alb 4.7 3.5  - 5.5 gm/dL   GLOBULIN 2.8 1.5 - 4.5 gm/dL   ALBUMIN/GLOBULIN RATIO 1.7 1.1 - 2.5   BUN/CREAT RATIO 12.6 11.0 - 26.0   ALT 21 0 - 55 U/L   AST 18 0 - 40 U/L   eGFR 107 >=60 mL/min/1.40m2  Protime-INR   Collection Time: 05/29/24  4:03 PM  Result Value Ref Range   PT 13.8 11.8 - 14.3 second(s)   INR 1.1 See Therapeutic ranges  Magnesium    Collection Time: 05/29/24  4:03 PM  Result Value Ref Range   Mg 1.9 1.6 - 2.6 mg/dL  Lipase   Collection Time: 05/29/24  4:03 PM  Result Value Ref Range   Lipase 20 0 - 59 U/L  Ethanol   Collection Time: 05/29/24  4:03 PM  Result Value Ref Range   Ethanol <10 0 mg/dL  Aspirin (ASA) Level   Collection Time: 05/29/24  4:03 PM  Result Value Ref Range   Salicylate <3.0 (L) 30.0 - 250.0 mcg/mL  Acetaminophen  (APAP) Level   Collection Time: 05/29/24  4:03 PM  Result Value Ref Range   Acetaminophen  <5.0 (L) 10.0 - 25.0 mcg/mL  POCT Glucose   Collection Time: 05/29/24  5:17 PM  Result Value Ref Range   Glucose, POC 70 70 - 99 mg/dL   OPERATOR ID [id number]    INSTRUMENT ID [id number]   ECG 12 lead   Collection Time: 05/29/24  5:38 PM  Result Value Ref Range   Acquisition Device D3K    Ventricular Rate 63 BPM   Atrial Rate 63 BPM   P-R Interval 148 ms   QRS Duration 102 ms   Q-T Interval 416 ms   QTC Calculation(Bazett) 425 ms   Calculated P Axis 16 degrees   Calculated R Axis 68 degrees   Calculated T Axis 68 degrees   ECG Diagnosis      Normal sinus rhythm with sinus arrhythmia Normal ECG When compared with ECG of 13-May-2023 16:37, Nonspecific T wave abnormality no longer evident in Lateral leads Normal axis Brien Kung ([id number]) on 05/29/2024 6:25:59 PM certifies that he/she has reviewed the ECG tracing and confirms the independent interpretation is correct.   POCT Glucose   Collection Time: 05/29/24  6:00  PM  Result Value Ref Range   Glucose, POC 83 70 - 99 mg/dL   OPERATOR ID [id number]    INSTRUMENT ID [id number]   POCT  Glucose   Collection Time: 05/29/24  6:39 PM  Result Value Ref Range   Glucose, POC 109 (H) 70 - 99 mg/dL   OPERATOR ID [id number]    INSTRUMENT ID [id number]     Imaging:  CT Angio Head Neck Result Date: 05/29/2024 CTA Head and Neck: INDICATION: Other headache/dizziness/trauma TECHNIQUE:  Thin axial scans were performed from the top of the aortic arch up through the neck and head after bolus injection of 75 mL mL of Isovue-370. 3-D MIP images were generated on an independent workstation and archived PACS. Radiation dose reduction was utilized (automated exposure control, mA or kV adjustment based on patient size, or iterative image reconstruction). COMPARISON: None FINDINGS: CTA NECK: #  Aortic arch and brachiocephalic vessels: No significant abnormality #  Right vertebral: Normal #  Left vertebral: Normal #  Right carotid artery: Normal. #  Left carotid artery: Normal. CTA HEAD: #  Carotid siphons: Patent #  Anterior cerebral arteries: Patent #  Middle cerebral arteries: Patent #  Anterior communicating artery: Patent #  #  Posterior cerebral arteries: Patent #  Basilar artery: Patent #  Superior cerebellar arteries: Patent #  Vertebral arteries: Patent #  PICA: Patent. Intradural origin. #  #  Aneurysm/AVM: None #  Dural venous sinuses: Patent #  Other: Normal   IMPRESSION: CTA NECK: Carotid and vertebral arteries within normal limits. CTA HEAD: No large vessel occlusion. Vasculature within normal limits. Degree of stenosis is determined using NASCET measurement technique: Severe: 70-99% Moderate: 50-69%. Mild: Less than 50%. Electronically Signed by: Dorn Burkes on 05/29/2024 5:50 PM  CT Head WO Contrast Result Date: 05/29/2024 CLINICAL HISTORY: Head trauma COMPARISON: 05/27/2024 head CT TECHNIQUE: CT images of the brain were obtained without contrast. Sagittal and coronal reformats are provided. FINDINGS: Intra-axial: There is no perceptible infarct. There is no lobar hematoma. No mass effect  or midline shift is detected. The gray matter is uniform. The white matter is normal. The basal ganglia are symmetric. The midline structures are anatomic. Extra-axial: The ventricles are normal in size and configuration for the patient's age. There is no abnormal fluid collection. Sella/Parasellar Region: The pituitary gland and sella are grossly unremarkable. Pineal Gland: The pineal gland is normal in size and configuration. Cerebellopontine Angles: Although assessment is limited by CT, no mass is identified within the cerebellopontine angles. Extracranial: The imaged globes and orbits are grossly unremarkable. The imaged sinonasal cavities are well aerated. The mastoid air cells are clear. Scalp/Calvarium: There is no suspicious sclerotic or lytic osseous lesion along the calvarium, skull base or upper cervical spine.   IMPRESSION: 1.  No acute intracranial abnormality. Electronically Signed by: Maude Angst on 05/29/2024 4:30 PM  XR Chest Ap Portable Result Date: 05/29/2024 INDICATION: Trauma COMPARISON: 05/25/2023 TECHNIQUE: Single AP view of the chest. FINDINGS: No evidence of acute infiltrate, pleural fluid, or pneumothorax. Mediastinal and hilar contours are within normal limits. No acute osseous abnormality.   IMPRESSION: 1. No evidence of acute intrathoracic disease. Electronically Signed by: Charlie Redfern MD on 05/29/2024 6:36 PM         [1] Allergies Allergen Reactions  . Bee Venom Anaphylaxis  . Haloperidol Hives, Myalgia and Other    Per father dystonic reaction with  muscle contractures.  Dystonic reaction  [2] Social History Tobacco Use  . Smoking status: Never

## 2024-05-31 ENCOUNTER — Encounter (HOSPITAL_COMMUNITY): Payer: Self-pay | Admitting: *Deleted

## 2024-05-31 ENCOUNTER — Emergency Department (HOSPITAL_COMMUNITY)

## 2024-05-31 ENCOUNTER — Emergency Department (HOSPITAL_COMMUNITY)
Admission: EM | Admit: 2024-05-31 | Discharge: 2024-06-01 | Disposition: A | Discharge status: Other Acute Inpatient Hospital | Attending: Emergency Medicine | Admitting: Emergency Medicine

## 2024-05-31 ENCOUNTER — Other Ambulatory Visit: Payer: Self-pay

## 2024-05-31 DIAGNOSIS — F333 Major depressive disorder, recurrent, severe with psychotic symptoms: Secondary | ICD-10-CM

## 2024-05-31 DIAGNOSIS — F6810 Factitious disorder, unspecified: Secondary | ICD-10-CM | POA: Diagnosis not present

## 2024-05-31 DIAGNOSIS — S41112A Laceration without foreign body of left upper arm, initial encounter: Secondary | ICD-10-CM

## 2024-05-31 DIAGNOSIS — Z91010 Allergy to peanuts: Secondary | ICD-10-CM | POA: Diagnosis not present

## 2024-05-31 DIAGNOSIS — Z23 Encounter for immunization: Secondary | ICD-10-CM | POA: Insufficient documentation

## 2024-05-31 DIAGNOSIS — X789XXA Intentional self-harm by unspecified sharp object, initial encounter: Secondary | ICD-10-CM | POA: Diagnosis not present

## 2024-05-31 DIAGNOSIS — Z7289 Other problems related to lifestyle: Secondary | ICD-10-CM

## 2024-05-31 DIAGNOSIS — R55 Syncope and collapse: Secondary | ICD-10-CM | POA: Diagnosis not present

## 2024-05-31 DIAGNOSIS — R4182 Altered mental status, unspecified: Secondary | ICD-10-CM | POA: Insufficient documentation

## 2024-05-31 DIAGNOSIS — R4588 Nonsuicidal self-harm: Secondary | ICD-10-CM | POA: Diagnosis not present

## 2024-05-31 DIAGNOSIS — Z8659 Personal history of other mental and behavioral disorders: Secondary | ICD-10-CM | POA: Diagnosis not present

## 2024-05-31 DIAGNOSIS — S51812A Laceration without foreign body of left forearm, initial encounter: Secondary | ICD-10-CM | POA: Diagnosis present

## 2024-05-31 LAB — COMPREHENSIVE METABOLIC PANEL WITH GFR
ALT: 28 U/L (ref 0–44)
AST: 27 U/L (ref 15–41)
Albumin: 4.7 g/dL (ref 3.5–5.0)
Alkaline Phosphatase: 116 U/L (ref 38–126)
Anion gap: 12 (ref 5–15)
BUN: 11 mg/dL (ref 6–20)
CO2: 22 mmol/L (ref 22–32)
Calcium: 10 mg/dL (ref 8.9–10.3)
Chloride: 106 mmol/L (ref 98–111)
Creatinine, Ser: 1.04 mg/dL (ref 0.61–1.24)
GFR, Estimated: >60 mL/min (ref >60)
Glucose, Bld: 86 mg/dL (ref 70–99)
Potassium: 3.7 mmol/L (ref 3.5–5.1)
Sodium: 139 mmol/L (ref 135–145)
Total Bilirubin: 1 mg/dL (ref 0.0–1.2)
Total Protein: 7.7 g/dL (ref 6.5–8.1)

## 2024-05-31 LAB — CBC WITH DIFFERENTIAL/PLATELET
Abs Immature Granulocytes: 0.03 K/uL (ref 0.00–0.07)
Basophils Absolute: 0 K/uL (ref 0.0–0.1)
Basophils Relative: 0 %
Eosinophils Absolute: 0.1 K/uL (ref 0.0–0.5)
Eosinophils Relative: 1 %
HCT: 40.2 % (ref 39.0–52.0)
Hemoglobin: 13.1 g/dL (ref 13.0–17.0)
Immature Granulocytes: 0 %
Lymphocytes Relative: 17 %
Lymphs Abs: 1.6 K/uL (ref 0.7–4.0)
MCH: 24.1 pg — ABNORMAL LOW (ref 26.0–34.0)
MCHC: 32.6 g/dL (ref 30.0–36.0)
MCV: 74 fL — ABNORMAL LOW (ref 80.0–100.0)
Monocytes Absolute: 0.8 K/uL (ref 0.1–1.0)
Monocytes Relative: 9 %
Neutro Abs: 6.7 K/uL (ref 1.7–7.7)
Neutrophils Relative %: 73 %
Platelets: 261 K/uL (ref 150–400)
RBC: 5.43 MIL/uL (ref 4.22–5.81)
RDW: 14.3 % (ref 11.5–15.5)
WBC: 9.3 K/uL (ref 4.0–10.5)
nRBC: 0 % (ref 0.0–0.2)

## 2024-05-31 LAB — ETHANOL: Alcohol, Ethyl (B): <15 mg/dL (ref <15)

## 2024-05-31 LAB — SALICYLATE LEVEL: Salicylate Lvl: <7 mg/dL — ABNORMAL LOW (ref 7.0–30.0)

## 2024-05-31 LAB — ACETAMINOPHEN LEVEL: Acetaminophen (Tylenol), Serum: <10 ug/mL — ABNORMAL LOW (ref 10–30)

## 2024-05-31 MED ORDER — LORAZEPAM 2 MG/ML IJ SOLN: 1.0000 mg | Freq: Two times a day (BID) | INTRAMUSCULAR | Status: DC | PRN

## 2024-05-31 MED ORDER — ARIPIPRAZOLE 10 MG PO TABS
10.0000 mg | ORAL_TABLET | Freq: Every day | ORAL | Status: DC
  Administered 2024-05-31 – 2024-06-01 (×2): 10 mg via ORAL
  Filled 2024-05-31 (×2): qty 1

## 2024-05-31 MED ORDER — LIDOCAINE-EPINEPHRINE-TETRACAINE (LET) TOPICAL GEL
3.0000 mL | Freq: Once | TOPICAL | Status: AC
  Administered 2024-05-31: 3 mL via TOPICAL
  Filled 2024-05-31: qty 3

## 2024-05-31 MED ORDER — ZIPRASIDONE MESYLATE 20 MG IM SOLR: 20.0000 mg | Freq: Two times a day (BID) | INTRAMUSCULAR | Status: DC | PRN

## 2024-05-31 MED ORDER — TETANUS-DIPHTH-ACELL PERTUSSIS 5-2-15.5 LF-MCG/0.5 IM SUSP
0.5000 mL | Freq: Once | INTRAMUSCULAR | Status: AC
  Administered 2024-05-31: 0.5 mL via INTRAMUSCULAR
  Filled 2024-05-31: qty 0.5

## 2024-05-31 MED ORDER — LIDOCAINE-EPINEPHRINE (PF) 2 %-1:200000 IJ SOLN
10.0000 mL | Freq: Once | INTRAMUSCULAR | Status: AC
  Administered 2024-05-31: 10 mL
  Filled 2024-05-31: qty 20

## 2024-05-31 MED ORDER — DIAZEPAM 5 MG/ML IJ SOLN
5.0000 mg | Freq: Once | INTRAMUSCULAR | Status: DC
  Filled 2024-05-31: qty 2

## 2024-05-31 NOTE — Progress Notes (Signed)
 Pt has been accepted to [facility] on 05/31/2024 . Bed assignment: Main campus  Pt meets inpatient criteria per Bernadette Barefoot, NP   Attending Physician will be Millie Manners, MD  Report can be called to: [phone] (this is a pager, please leave call-back number when giving report)  Pt can arrive after ASAP  Care Team Notified: Francis Sayres, Paramedic, Chesley Holt, Bernadette Barefoot, NP

## 2024-05-31 NOTE — Progress Notes (Signed)
 Inpatient Psychiatric Referral  Patient was recommended inpatient per Bernadette Barefoot, NP. There are no available beds at [facility], per [facility] . Patient was referred to the following out of network facilities:  Destination  Service Provider Address Phone Fax  [facility]  [address], [place] [address] [phone]  [facility]  [address] [place] [address] [phone]  [facility]  [address] [place] [address] [phone]  [facility]  [address], [place] [address] [phone]  [facility]  [address] [place] [address] [phone]  [facility]  [address] [place], [place] [phone]  [facility]  [address] [place] [address] [phone]    Situation ongoing, CSW to continue following and update chart as more information becomes available.   Harrie Sofia MSW, LCSWA 05/31/2024
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
## 2024-05-31 NOTE — Consult Note (Signed)
 Patient evaluated by psychiatry and referred for psychiatric admission.  [facility] AC Cherylynn Ernst and [facility] hospital team notified.   Complete Psych Consult note to follow

## 2024-05-31 NOTE — ED Notes (Signed)
 Pt has been dressed out into burgundy scrubs. Pt has one pt belonging bag. Pt was wanded by security the best they could do. Please have security wand pt when he is fully awake.

## 2024-05-31 NOTE — ED Notes (Addendum)
 Staff secured 1 bag of belongings in locker #37

## 2024-05-31 NOTE — Consult Note (Signed)
 [facility] Psychiatric Consult Initial  Patient Name: .Vincent Frank  MRN: [id number]  DOB: 2001/06/10  Consult Order details:  Orders (From admission, onward)     Start     Ordered   05/31/24 0609  CONSULT TO CALL ACT TEAM       Ordering Provider: Beverley Leita LABOR, PA-C  Provider:  (Not yet assigned)  Question:  Reason for Consult?  Answer:  Psych consult   05/31/24 9391   05/31/24 0507  CONSULT TO CALL ACT TEAM       Ordering Provider: Beverley Leita LABOR, PA-C  Provider:  (Not yet assigned)  Question:  Reason for Consult?  Answer:  psychosis   05/31/24 0506             Mode of Visit: Tele-visit Virtual Statement:TELE PSYCHIATRY ATTESTATION & CONSENT As the provider for this telehealth consult, I attest that I verified the patient's identity using two separate identifiers, introduced myself to the patient, provided my credentials, disclosed my location, and performed this encounter via a HIPAA-compliant, real-time, face-to-face, two-way, interactive audio and video platform and with the full consent and agreement of the patient (or guardian as applicable.) Patient physical location: [facility]. Telehealth provider physical location: home office in state of [place].   Video start time: 1329 Video end time: 1415    Psychiatry Consult Evaluation  Service Date: May 31, 2024 LOS:  LOS: 0 days  Chief Complaint I don't know what happened  Primary Psychiatric Diagnoses  MDD, recurrent, severe with psychosis Self injurious behaviors 3.  History of Factitious Disorder  Assessment  Vincent Frank is a 23 y.o. male admitted: Presented to the EDfor 05/31/2024  3:05 AM for  Per ED Provider Admission Assessment dated 05/31/2024: Patient presents with: Headache   Vincent Frank is a 23 y.o. male.   23 year old male brought in by EMS.  EMS was called to the woods for report of syncope.  Patient was found with multiple lacerations to his left arm.  Bleeding was controlled on  arrival.  Patient reports a headache and stated he had a concussion following a rugby accident a few days ago. Patient reports light sensitivity, refuses to answer questions.  He carries the psychiatric diagnoses of factitious disorder, suicide attempt, MDD, ODD, and has a past medical history of Tremor, Apnea, Abdominal Pain, Anemia.  Patient was medically cleared prior to psychiatric evaluation.   His current presentation of depression, various lacerations to his left forearm with historically no recollection of events that led to his current admission is most consistent with MDD, recurrent, severe, with psychosis. He reports his sustained a concussion a few days prior to admission which has left him with some memory deficits. His head CT was negative.  He meets criteria for psychiatric admission based on above.  Current outpatient psychotropic medications include aripiprazole  15mg  po daily and historically he has had a good response to these medications. He was non compliant with medications prior to admission as evidenced by his reports.   On initial examination, patient presents alert and oriented to current situation but endorses memory loss related to events to that led to admission. He makes fair eye contact, his speech is low but coherent.  His is guarded and appears to intentionally misrepresent information related to his hx of suicidal and self injurious behaviors. His thoughts are illogical and of rumination.  He denies suicidal ideations but presents with 3 lacerations to his left forearms that required sutures.  However, he cannot remember  what happened. He presents with poor judgment and lacks insight of his current concerns, and medication non-adherence which contributes to high risk safety concerns. Above was discussed with patient who is not interested in admission and expresses his desire to go home.  Recommend upholding IVC.   Please see plan below for detailed recommendations.    Diagnoses:  Active Hospital problems: Principal Problem:   MDD (major depressive disorder), recurrent, severe, with psychosis (HCC) Active Problems:   History of factitious disorder   Self-injurious behavior    Plan   ## Psychiatric Medication Recommendations:  Restart home medications at lower dose to account for non-adherence. Aripiprazole  10mg  po daily for mood stability Hydroxyzine  25mg  po TID prn anxiety  Agitation protocol -Geodon  20mg  po IM BID prn and lorazepam  1mg  po BID prn severe anxiety   ## Medical Decision Making Capacity: Not specifically addressed in this encounter  ## Further Work-up:  -- deferred for now While pt on Qtc prolonging medications, please monitor & replete K+ to 4 and Mg2+ to 2 -- most recent EKG on 05/31/2024 had QtC of 393/451 -- Pertinent labwork reviewed earlier this admission includes: CMP, CBC, UDS and EKG,  Head CT- IMPRESSION: 1. Stable and negative non contrast CT head when allowing for mild motion artifact.   ## Disposition:-- We recommend inpatient psychiatric hospitalization when medically cleared. Patient is under voluntary admission status at this time; please IVC if attempts to leave hospital.  ## Behavioral / Environmental: -Patient would benefit from more frequent contact with medical team to delineate plan of care and allow for clarification questions, which will help alleviate anxiety regarding treatment. If possible, try to check back in with the pt in the afternoon.    ## Safety and Observation Level:  - Based on my clinical evaluation, I estimate the patient to be at low risk of self harm in the current setting. - At this time, we recommend  routine. This decision is based on my review of the chart including patient's history and current presentation, interview of the patient, mental status examination, and consideration of suicide risk including evaluating suicidal ideation, plan, intent, suicidal or self-harm behaviors,  risk factors, and protective factors. This judgment is based on our ability to directly address suicide risk, implement suicide prevention strategies, and develop a safety plan while the patient is in the clinical setting. Please contact our team if there is a concern that risk level has changed.  CSSR Risk Category:C-SSRS RISK CATEGORY: No Risk  Suicide Risk Assessment: Patient has following modifiable risk factors for suicide: untreated depression, recklessness, medication noncompliance, active mental illness (to encompass adhd, tbi, mania, psychosis, trauma reaction), triggering events, and recent psychiatric hospitalization, which we are addressing by referring for psychiatric admission, restarting medication for mood stability. Patient has following non-modifiable or demographic risk factors for suicide: male gender, history of self harm behavior, and psychiatric hospitalization Patient has the following protective factors against suicide: Access to outpatient mental health care and Supportive family  Thank you for this consult request. Recommendations have been communicated to the primary team.  We will sign off at this time.   Bernadette FORBES Barefoot, NP       History of Present Illness  Relevant Aspects of Hospital ED Course:  Admitted on 05/31/2024 for Per RN Triage Note dated 05/31/2024:0305: Pt arrived with EMS for syncope. Found outside of woods with lacerations to left arm. Several cuts noted to arm. Pt denies self harm at present. Reports dx of concussion a few  days ago following rugby accident. C/o headache. Denies NV   Report:  Patient is observed dangled at the bedside, alert and oriented to current situation but he cannot recall events that led to current hospitalization. When asked what was the last thing he remembers prior to admission.  He reports getting into an argument and I went ballistic on my parents, and then the police arrived.  States he cannot recall what happened  afterwards.  States he does not know how he ended up in the woods with lacerations to his arm. He denies hx for similar concerns.  He also denies alcohol, marijuana or use of street drugs.  Per chart review, patient was hospitalized for similar self injurious concerns and suicide attempt 6 months ago.   Patient report Patient denies hx for bipolar disorder but his chart lists bipolar.  We reviewed is IVC paperwork, and patient states, If I have bipolar disorder, somebody's not telling me something.  He reluctantly reports prior suicide attempts via cutting himself, and subsequent psychiatric hospitalization.He reports he was prescribed aripiprazole  15mg  po daily but he stopped taking medication after completing PHP; states it helped with depression but made him feel low motivation or at times he would start about 30 activities and not complete any of them.  He states at baseline he's hyperactive and the medication made it worse.   Psych ROS:  Depression: has hx but currently denies Anxiety:  yes Mania (lifetime and current): he denies  Psychosis: (lifetime and current): current  Collateral information:  deferred  Review of Systems  Constitutional: Negative.   HENT: Negative.    Eyes: Negative.   Respiratory: Negative.    Cardiovascular: Negative.   Gastrointestinal: Negative.   Genitourinary: Negative.   Musculoskeletal: Negative.   Skin: Negative.   Neurological: Negative.   Endo/Heme/Allergies: Negative.   Psychiatric/Behavioral:  Positive for memory loss. The patient is nervous/anxious.      Psychiatric and Social History  Psychiatric History:  Information collected from patient and chart review  Prev Dx/Sx: bipolar, depression, anxiety, conversion disorder Current Psych Provider: He is unsure of provider name Home Meds (current): he stopped taking medications and cannot provide timeline Previous Med Trials: aripiprazole  15mg  po daily but he stopped after completing PHP;  states it helped with depression but made him feel low motivation or at times he would start about 30 activities and not complete any of them.  He states at baseline he's hyperactive  Therapy: Earnie Mayer, [facility], [place]  Prior Psych Hospitalization: inpatient at [facility] in [place]  and then completed PHP at [facility] and has been with therapist since then.  Prior Self Harm: prior suicide attempt, jumped off a roof, 20-30 feet and decided on the way down that he no longer wanted to jump. States he did a dance movement psychotherapist landing and landed to minimize injury.  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 States he has to complete inpatient and PHP and charge would be dropped Living Situation: lives with his parents, sister and 2 dogs Spiritual Hx: Christian Access to weapons/lethal means: denies   Substance History Alcohol: past year drank once at the start of rugsy seasons, victory drink  Type  of alcohol denies daily usage Last Drink unknown Number of drinks per day denies History of alcohol withdrawal seizures n/a History of DT's n/a Tobacco: denies Illicit drugs: denies Prescription drug abuse: n/a Rehab hx: denies  Exam Findings  Physical Exam:  Vital Signs:  Temp:  [97.6 F (36.4 C)-98.4 F (36.9 C)] 98.4 F (36.9 C) (11/05 1444) Pulse Rate:  [69-75] 75 (11/05 1444) Resp:  [16] 16 (11/05 1444) BP: (135-149)/(75-94) 135/75 (11/05 1444) SpO2:  [98 %-100 %] 98 % (11/05 1444) Blood pressure 135/75, pulse 75, temperature 98.4 F (36.9 C), temperature source Oral, resp. rate 16, SpO2 98%. There is no height or  weight on file to calculate BMI.  Physical Exam Pulmonary:     Effort: Pulmonary effort is normal.  Neurological:     Mental Status: He is alert.  Psychiatric:        Attention and Perception: Perception normal. He is inattentive.        Mood and Affect: Mood is anxious. Affect is blunt.        Speech: Speech is delayed.        Behavior: Behavior is withdrawn. Behavior is cooperative.        Thought Content: Thought content does not include homicidal or suicidal ideation. Thought content does not include homicidal or suicidal plan.        Cognition and Memory: Cognition normal. Memory is impaired.        Judgment: Judgment is impulsive and inappropriate.     Mental Status Exam: General Appearance: Disheveled  Orientation:  Full (Time, Place, and Person)  Memory:  Immediate;   Fair Recent;   Poor Remote;   Poor  Concentration:  Concentration: Fair and Attention Span: Fair  Recall:  Poor  Attention  Fair  Eye Contact:  Fair  Speech:  Slow  Language:  Good  Volume:  Normal  Mood: Anxious, Dysphoric   Affect:  Blunt and Congruent  Thought Process:  Linear  Thought Content:  Illogical and Rumination  Suicidal Thoughts:  he denies but presented with 3 lacerations to his left forearm that required sutures.  Homicidal Thoughts:  No  Judgement:  Poor  Insight:  Lacking  Psychomotor Activity:  Decreased  Akathisia:  No  Fund of Knowledge:  Fair      Assets:  Solicitor Social Support  Cognition:  WNL  ADL's:  Impaired  AIMS (if indicated):        Other History   These have been pulled in through the EMR, reviewed, and updated if appropriate.  Family History:  The patient's family history includes Healthy in his mother.  Medical History: Past Medical History:  Diagnosis Date   Anxiety    Depression    Major depressive disorder    Seizures (HCC)     Surgical History: Past Surgical History:  Procedure Laterality  Date   NO PAST SURGERIES       Medications:   Current Facility-Administered Medications:    ARIPiprazole  (ABILIFY ) tablet 10 mg, 10 mg, Oral, Daily, Pat Sires E, NP, 10 mg at 05/31/24 1516   ziprasidone  (GEODON ) injection 20 mg, 20 mg, Intramuscular, BID PRN **AND** LORazepam  (ATIVAN ) injection 1 mg, 1 mg, Intramuscular, BID PRN, Moishe Burton E, NP  Current Outpatient Medications:    ARIPiprazole  (ABILIFY ) 15 MG tablet, Take 1 tablet (15 mg total) by mouth daily. (Patient not taking: Reported on 05/31/2024), Disp: 30 tablet, Rfl: 0   ARIPiprazole  (ABILIFY ) 5 MG tablet, Take  1 tablet (5 mg total) by mouth at bedtime for 14 days. (Patient not taking: Reported on 05/31/2024), Disp: 14 tablet, Rfl: 0   FLUoxetine  (PROZAC ) 20 MG capsule, Take 3 capsules (60 mg total) by mouth daily for 14 days. (Patient not taking: Reported on 05/31/2024), Disp: 42 capsule, Rfl: 0   FLUoxetine  (PROZAC ) 20 MG capsule, Take 3 capsules (60 mg total) by mouth daily. (Patient not taking: Reported on 05/31/2024), Disp: 30 capsule, Rfl: 0   hydrOXYzine  (ATARAX ) 25 MG tablet, Take 1 tablet (25 mg total) by mouth 3 (three) times daily as needed for anxiety. (Patient not taking: Reported on 05/31/2024), Disp: 14 tablet, Rfl: 0   hydrOXYzine  (ATARAX ) 25 MG tablet, Take 1 tablet (25 mg total) by mouth 3 (three) times daily as needed for anxiety. (Patient not taking: Reported on 05/31/2024), Disp: 30 tablet, Rfl: 0   lamoTRIgine  (LAMICTAL  XR) 50 MG 24 hour tablet, Take 1 tablet (50 mg total) by mouth daily for 3 days, THEN 2 tablets (100 mg total) daily for 14 days. (Patient not taking: Reported on 05/31/2024), Disp: 28 tablet, Rfl: 0   lamoTRIgine  (LAMICTAL ) 25 MG tablet, Take 1 tablet (25 mg total) by mouth daily for 14 days. (Patient not taking: Reported on 05/31/2024), Disp: 14 tablet, Rfl: 0   naproxen  (NAPROSYN ) 500 MG tablet, Take 1 tablet (500 mg total) by mouth 2 (two) times daily. (Patient not taking: Reported on  05/31/2024), Disp: 10 tablet, Rfl: 0   oxybutynin  (DITROPAN ) 5 MG tablet, Take 1 tablet (5 mg total) by mouth 3 (three) times daily. (Patient not taking: Reported on 05/31/2024), Disp: 30 tablet, Rfl: 0   oxyCODONE -acetaminophen  (PERCOCET/ROXICET) 5-325 MG tablet, Take 1 tablet by mouth every 8 (eight) hours as needed for severe pain (pain score 7-10). (Patient not taking: Reported on 05/31/2024), Disp: 6 tablet, Rfl: 0   traZODone  (DESYREL ) 50 MG tablet, Take 1 tablet (50 mg total) by mouth at bedtime. (Patient not taking: Reported on 05/31/2024), Disp: 14 tablet, Rfl: 0  Allergies: Allergies  Allergen Reactions   Bee Venom Anaphylaxis   Gabapentin  Other (See Comments) and Anaphylaxis    Muscle twitching   Haldol [Haloperidol] Anaphylaxis and Other (See Comments)    Per father, dystonic reaction with  muscle contractures- also   Hornet Venom Anaphylaxis and Itching   Peanut-Containing Drug Products Anaphylaxis   Wasp Venom Anaphylaxis   Beeswax Swelling    Bernadette FORBES Barefoot, NP

## 2024-05-31 NOTE — ED Triage Notes (Signed)
 Pt arrived with EMS for syncope. Found outside of woods with lacerations to left arm. Several cuts noted to arm. Pt denies self harm at present. Reports dx of concussion a few days ago following rugby accident. C/o headache. Denies NV

## 2024-05-31 NOTE — ED Notes (Signed)
 Per PA we are holding off on valium  due to pt being asleep and not agitated at this time. Will reassess if he has issues during CT. Valium  PRN if needed.

## 2024-05-31 NOTE — ED Notes (Signed)
 Delaying TTS until pt is more alert and coherent.

## 2024-06-01 NOTE — ED Notes (Signed)
 Patient still has not give urine sample. Asked patient this morning and he said no

## 2024-06-01 NOTE — Discharge Instructions (Addendum)
Transfer[facility] to [facility]

## 2024-06-01 NOTE — ED Notes (Signed)
 Patient off unit to facility peer provider. Patient alert, cooperative, no s/s of distress at this time. Discharge and belongings given to [facility] for transport. Patient ambulatory off unit, escorted and transported by sheriff.

## 2024-06-01 NOTE — ED Provider Notes (Signed)
 Emergency Medicine Observation Re-evaluation Note  Vincent Frank is a 23 y.o. male, seen on rounds today.  Pt initially presented to the ED for complaints of Headache Currently, the patient is awaiting to go to [facility] .  Physical Exam  BP (!) 144/68 (BP Location: Left Arm)   Pulse 71   Temp 97.7 F (36.5 C) (Oral)   Resp 17   SpO2 97%  Physical Exam Alert and in no acute distress  ED Course / MDM  EKG:EKG Interpretation Date/Time:  Wednesday May 31 2024 03:37:48 EST Ventricular Rate:  79 PR Interval:  201 QRS Duration:  114 QT Interval:  393 QTC Calculation: 451 R Axis:   55  Text Interpretation: Sinus rhythm Consider left atrial enlargement Incomplete right bundle branch block Confirmed by Griselda Norris [phone]) on 05/31/2024 3:59:03 AM  I have reviewed the labs performed to date as well as medications administered while in observation.  Recent changes in the last 24 hours include none.  Plan  Current plan is for send to [facility] for treatment.    Suzette Pac, MD 06/01/24 [phone]

## 2024-08-02 ENCOUNTER — Inpatient Hospital Stay
Admission: EM | Admit: 2024-08-02 | Discharge: 2024-08-11 | DRG: 918 | Disposition: A | Discharge status: Other Acute Inpatient Hospital | Attending: Internal Medicine | Admitting: Internal Medicine

## 2024-08-02 DIAGNOSIS — F4310 Post-traumatic stress disorder, unspecified: Secondary | ICD-10-CM | POA: Diagnosis present

## 2024-08-02 DIAGNOSIS — Z9151 Personal history of suicidal behavior: Secondary | ICD-10-CM

## 2024-08-02 DIAGNOSIS — T450X2A Poisoning by antiallergic and antiemetic drugs, intentional self-harm, initial encounter: Secondary | ICD-10-CM | POA: Diagnosis present

## 2024-08-02 DIAGNOSIS — T50902A Poisoning by unspecified drugs, medicaments and biological substances, intentional self-harm, initial encounter: Principal | ICD-10-CM | POA: Diagnosis present

## 2024-08-02 DIAGNOSIS — S069XAS Unspecified intracranial injury with loss of consciousness status unknown, sequela: Secondary | ICD-10-CM

## 2024-08-02 DIAGNOSIS — Z639 Problem related to primary support group, unspecified: Secondary | ICD-10-CM

## 2024-08-02 DIAGNOSIS — R339 Retention of urine, unspecified: Secondary | ICD-10-CM | POA: Diagnosis present

## 2024-08-02 DIAGNOSIS — T43222A Poisoning by selective serotonin reuptake inhibitors, intentional self-harm, initial encounter: Principal | ICD-10-CM | POA: Diagnosis present

## 2024-08-02 DIAGNOSIS — Z888 Allergy status to other drugs, medicaments and biological substances status: Secondary | ICD-10-CM

## 2024-08-02 DIAGNOSIS — F329 Major depressive disorder, single episode, unspecified: Secondary | ICD-10-CM | POA: Diagnosis present

## 2024-08-02 DIAGNOSIS — W2201XA Walked into wall, initial encounter: Secondary | ICD-10-CM | POA: Diagnosis present

## 2024-08-02 DIAGNOSIS — T43291A Poisoning by other antidepressants, accidental (unintentional), initial encounter: Secondary | ICD-10-CM | POA: Diagnosis present

## 2024-08-02 DIAGNOSIS — F603 Borderline personality disorder: Secondary | ICD-10-CM | POA: Diagnosis present

## 2024-08-02 DIAGNOSIS — Z1152 Encounter for screening for COVID-19: Secondary | ICD-10-CM

## 2024-08-02 DIAGNOSIS — F419 Anxiety disorder, unspecified: Secondary | ICD-10-CM | POA: Diagnosis present

## 2024-08-02 DIAGNOSIS — Z87820 Personal history of traumatic brain injury: Secondary | ICD-10-CM

## 2024-08-02 DIAGNOSIS — Z79899 Other long term (current) drug therapy: Secondary | ICD-10-CM

## 2024-08-02 DIAGNOSIS — N179 Acute kidney failure, unspecified: Secondary | ICD-10-CM | POA: Diagnosis present

## 2024-08-02 DIAGNOSIS — T391X2A Poisoning by 4-Aminophenol derivatives, intentional self-harm, initial encounter: Secondary | ICD-10-CM | POA: Diagnosis present

## 2024-08-02 DIAGNOSIS — Z91030 Bee allergy status: Secondary | ICD-10-CM

## 2024-08-02 DIAGNOSIS — R4587 Impulsiveness: Secondary | ICD-10-CM | POA: Diagnosis present

## 2024-08-02 DIAGNOSIS — Z560 Unemployment, unspecified: Secondary | ICD-10-CM

## 2024-08-02 DIAGNOSIS — F445 Conversion disorder with seizures or convulsions: Secondary | ICD-10-CM | POA: Diagnosis present

## 2024-08-02 DIAGNOSIS — Z9152 Personal history of nonsuicidal self-harm: Secondary | ICD-10-CM

## 2024-08-02 LAB — COMPREHENSIVE METABOLIC PANEL WITH GFR
ALT: 38 U/L (ref 0–44)
AST: 24 U/L (ref 15–41)
Albumin: 5.2 g/dL — ABNORMAL HIGH (ref 3.5–5.0)
Alkaline Phosphatase: 98 U/L (ref 38–126)
Anion gap: 15 (ref 5–15)
BUN: 15 mg/dL (ref 6–20)
CO2: 22 mmol/L (ref 22–32)
Calcium: 10.5 mg/dL — ABNORMAL HIGH (ref 8.9–10.3)
Chloride: 101 mmol/L (ref 98–111)
Creatinine, Ser: 1.13 mg/dL (ref 0.61–1.24)
GFR, Estimated: >60 mL/min
Glucose, Bld: 114 mg/dL — ABNORMAL HIGH (ref 70–99)
Potassium: 3.8 mmol/L (ref 3.5–5.1)
Sodium: 139 mmol/L (ref 135–145)
Total Bilirubin: 0.4 mg/dL (ref 0.0–1.2)
Total Protein: 8.5 g/dL — ABNORMAL HIGH (ref 6.5–8.1)

## 2024-08-02 LAB — CBC WITH DIFFERENTIAL/PLATELET
Abs Immature Granulocytes: 0.04 K/uL (ref 0.00–0.07)
Basophils Absolute: 0 K/uL (ref 0.0–0.1)
Basophils Relative: 1 %
Eosinophils Absolute: 0.1 K/uL (ref 0.0–0.5)
Eosinophils Relative: 1 %
HCT: 41 % (ref 39.0–52.0)
Hemoglobin: 13.5 g/dL (ref 13.0–17.0)
Immature Granulocytes: 1 %
Lymphocytes Relative: 20 %
Lymphs Abs: 1.5 K/uL (ref 0.7–4.0)
MCH: 24.3 pg — ABNORMAL LOW (ref 26.0–34.0)
MCHC: 32.9 g/dL (ref 30.0–36.0)
MCV: 73.7 fL — ABNORMAL LOW (ref 80.0–100.0)
Monocytes Absolute: 0.4 K/uL (ref 0.1–1.0)
Monocytes Relative: 5 %
Neutro Abs: 5.6 K/uL (ref 1.7–7.7)
Neutrophils Relative %: 72 %
Platelets: 287 K/uL (ref 150–400)
RBC: 5.56 MIL/uL (ref 4.22–5.81)
RDW: 13.8 % (ref 11.5–15.5)
WBC: 7.7 K/uL (ref 4.0–10.5)
nRBC: 0 % (ref 0.0–0.2)

## 2024-08-02 LAB — SALICYLATE LEVEL: Salicylate Lvl: <7 mg/dL — ABNORMAL LOW (ref 7.0–30.0)

## 2024-08-02 LAB — ACETAMINOPHEN LEVEL: Acetaminophen (Tylenol), Serum: <10 ug/mL — ABNORMAL LOW (ref 10–30)

## 2024-08-02 LAB — ETHANOL: Alcohol, Ethyl (B): <15 mg/dL

## 2024-08-02 MED ORDER — ONDANSETRON HCL 4 MG/2ML IJ SOLN
4.0000 mg | Freq: Once | INTRAMUSCULAR | Status: AC
  Administered 2024-08-02: 4 mg via INTRAVENOUS
  Filled 2024-08-02: qty 2

## 2024-08-02 MED ORDER — ACTIDOSE WITH SORBITOL 50 GM/240ML PO SUSP
50.0000 g | Freq: Once | ORAL | Status: AC
  Administered 2024-08-02: 50 g via ORAL
  Filled 2024-08-02: qty 240

## 2024-08-02 MED ORDER — FLUOXETINE HCL 20 MG PO CAPS
20.0000 mg | ORAL_CAPSULE | Freq: Every day | ORAL | Status: DC
  Administered 2024-08-02 – 2024-08-11 (×10): 20 mg via ORAL
  Filled 2024-08-02 (×10): qty 1

## 2024-08-02 MED ORDER — OLANZAPINE 10 MG PO TABS
10.0000 mg | ORAL_TABLET | Freq: Every day | ORAL | Status: DC
  Administered 2024-08-02: 10 mg via ORAL
  Filled 2024-08-02: qty 1

## 2024-08-02 NOTE — Discharge Summary (Signed)
 [facility] HEALTH [facility] Psychiatry - Discharge Summary  Date of Admission: 07/28/2024 Date of Discharge: 08/02/2024 Attending Provider: Lane JONELLE Pacific, MD Hospital LOS:  5 days  Time Spent performing discharge services:  - 35 minutes  Discharge Diagnoses and Medications   Active Hospital Problems   Diagnosis Date Noted POA   *Major depressive disorder, recurrent 07/28/2024 Yes   Tachycardia 08/02/2024 Yes   Generalized abdominal pain 07/30/2024 Clinically Undetermined   Suicide attempt by acetaminophen  overdose (*) 07/29/2024 Yes   Toxic metabolic encephalopathy 07/27/2024 Yes   Post concussive syndrome 05/30/2024 Yes   History of psychogenic nonepileptic seizure 05/12/2023 Not Applicable   History of self mutilation 05/12/2023 Not Applicable    Resolved Hospital Problems   Diagnosis Date Noted Date Resolved POA   Atypical chest pain 07/30/2024 08/02/2024 Clinically Undetermined   Other constipation 07/30/2024 08/02/2024 Yes    Medications:    Medication List     CONTINUE taking these medications      Instructions  FLUoxetine  20 mg capsule Dose: 20 mg For: Major Depressive Disorder Commonly known as: PROZAC     Take one capsule (20 mg dose) by mouth daily.   hydrOXYzine  pamoate 25 mg capsule Dose: 25 mg For: Feeling Anxious Commonly known as: VISTARIL     Take one capsule (25 mg dose) by mouth every 8 (eight) hours as needed.        Risks, benefits, and side effects were discussed in detail prior to discharge. Hospital Course   Consulting Services: NICS Indication for Admission: risk of self injury  Vincent Frank is a 24 y.o. male with a history significant for MDD, PTSD, borderline personality disorder, who presented secondary to intentional overdose.  On admission, Per psych consult documentation completed on 07/28/2024:  male with a history of Depression, anxiety and PTSD who presented for treatment of  intentional overdose on acetaminophen  and bupropion. Psychiatry was consulted to assist with depression.  Medical diagnoses that may be significant as precipitating or perpetuating factors influencing the patient's presentation include toxic metabolic encephalopathy.  Pertinent psychological factors include patient has a history of overdose.  Socially, the patient lives with his parents.   Per report, currently on interview, the patient is cooperative for eye contact.  Alert and oriented x 4.  Patient reports presenting to the emergency department after overdosing on acetaminophen  and bupropion.  Patient reports that he was discharged from [facility] on New Year's Eve and when he returned home his parents immediately jumped on him about small things that don't matter.  At beginning of interview mother came in the room which appeared to trigger patient and he asked her to leave.  He continued to seem uneasy citing that his mom was at the front desk and states that she is an extremely manipulative person. He does not want his mother to be involved in treatment at this time. He goes on to say that after his interaction with his family on NYE he decided to go to to work. There was supposed to be a party at work (he is security at a bar) and he started spiraling at work because he didn't want his parents to come. He ended up leaving work early and walked around for several hours. Eventually he went to a store and bought a bottle of acetaminophen  and went to an athletic part and overdosed on that and bupropion. He doesn't remember what happened after that. Generally he reports that he does not sleep too bad but does  have nightmares- wakes up feeling tired. Appetite is decreased. Currently denies SI/HI/AVH and does not appear acutely manic or psychotic. He reports rare flashbacks and has had two panic attacks in his lifetime- none recently. Endorses excessive worries.   , Per hospitalist documentation  completed on 07/27/2024: This is the patient's third hospitalization in the last months with suicide attempts.  The patient admitted 11/16-19 with postconcussive encephalopathy, severe major depression and suicidal ideation.  Admitted at [facility] 11/30 to 12/5 with overdose of unknown medication.  Admitted at [facility] 12/18 to 12/20 with overdose on Wellbutrin.  Patient discharged yesterday from [facility] with a prescription for olanzapine .    During the course of the hospitalization patient was monitored closely for safety due to suicidal ideation.  Prozac  was continued at 20 mg for depression and anxiety.  Initially patient was refusing medications and was spending most days in bed withdrawn.  Patient started taking his medications, consuming 100% of his meals and attending therapeutic groups. The patient was offered a family meeting on going between patient's mother, providers and social work.  Patient's mother reported patient with a history of going to the hospital whenever he feels like relying on his parents for transportation to and from appointments.  Patient's mom aware that patient will need ACT team at discharge due to multiple hospitalization.  Patient's mom also reported they are seeking for guardianship.  With medication compliance, attended therapeutic groups, denial of suicidal or homicidal ideation, improved mood, treatment team agreed that patient is currently stable to be discharged home to continue with outpatient treatment.  Recommendation for as well as care coordinator.  All prescription has been sent to patient's pharmacy of choice.  Transportation was arranged.  SUICIDE ASSESSMENT FOR DISCHARGE: Non-modifiable risk factors for suicide: Biological male sex at birth, Prior history of suicide attempt(s) or serious self-harm behavior, History of a past or current impulse control disorder or aggression, Single, widowed, divorced or separated, History of a past or current mental health  disorder, particularly a depressive disorder, severe anxiety or eating disorder Suicide risk protective factors: Safety plan in place for discharge, Presence of support, Relationship with the treatment team, Reason for living, Adequate coping skills Interventions to improve safety on discharge: Follow-up care scheduled within a week of discharge for continued care, Assessed that the patients current mental health condition is stabilized well enough to transition to community based care, Assessed for appropriate resolution of the patients suicidal intent and/or plan, Safely managed detoxification off substances and provided a referral to appropriate substance use treatment in the community, Ensured access to medications on discharge, Addressed housing problems if pertinent, Provided the patient information on emergency resources in the community, It was confirmed with family or close contact that the patient does not have immediate access to a gun at discharge   On 08/02/2024, following sustained improvement in the affect of this patient, continued report of euthymic mood, repeated denial of suicidal, homicidal, and other violent ideation, adequate interaction with peers, active participation in groups while on the unit, and denial of adverse reactions from medications, the treatment team decided Vincent Frank was stable for discharge home with scheduled mental health treatment as noted below.  Metabolic Screening: Team to review results with patient prior to discharge as applicable (e.g. if patient on antipsychotics at discharge) Metabolic screening labs have been completed within the past 12 months and results are below.   Medication changes during this hospitalization: See complete medication reconciliation list.  Justification for two or more  antipsychotic medications: Is not being discharged on multiple antipsychotic meds  Tobacco/Substance Use Recommendation   Tobacco use in the past  30 days? Denies  Referral to outpatient treatment for Substance/Tobacco use disorder was not indicated.  When applicable, scheduled referrals are listed below.  FDA-approved cessation medication prescription offered/prescribed: N/A  Condition Upon Discharge    Vitals:  Vitals:   08/02/24 0731  BP: (!) 119/59  Pulse: 62  Resp: 20  Temp: (!) 96.3 F (35.7 C)  SpO2: 97%     Constitutional:   General Appearance Well dressed, well-groomed, normal appearance.  General Behavior Pleasant and cooperative  Musculoskeletal:   Gait and Station No gait abnormalities  Strength and tone normal  Psychiatric:   Psychomotor Activity No motor abnormalities  Speech  Normal in rate/volume/tone  Mood  Euthymic  Affect  Full range/appropriate and reactive  Thought Process Linear, logical, and goal directed  Associations intact  Thought Content/Perceptual Disturbances denies suicidal/homicidal ideation, auditory/visual hallucinations, delusions, paranoia, grandiosity, increased goal directed activity, preoccupation, obsessions/compulsions, and phobias  Cognition/Sensorium  orientation intact (AAOx4), memory intact, attention intact, language normal, and fund of knowledge intact  Insight  good  Judgment good   Discharge Instructions and Disposition   Discharge Procedure Orders  Follow-up with Primary Care Physician  Standing Status: Future  Referral Priority: Routine Referral Type: Consultation  Referral Reason: Evaluate and Return  Number of Visits Requested: 1 Expiration Date: 01/28/25   Regular Diet   Regular Diet   Notify physician - Temp  Order Comments: Call MD if Temperature above 100.6  Degrees F   Notify Physician and CALL 911 if you experience Chest Pain or discomfort, especially if associated with tiredness, sick on stomach or sweaty feeling.   Notify Physician and Call 911 if you experience any of the following: Sudden numbness or weakness, sudden trouble speaking, vision  problems, trouble walking, sudden severe headache with no known cause.   Activity as tolerated   Take medications as prescribed   Attend scheduled appoinments   Per case management / treatment team   Do not use drugs or alcohol   Obtain an AA/NA sponsor   Weapons to be secured at home Clinical Biochemist)   Home Contract Reviewed   Notify physician for suicidal / homicidal thoughts   Notify physician for medication concerns   Activity as tolerated        The patient was referred to the providers listed above at the appointment time listed above for the treatment of behavioral health and/or substance use disorder.  Disposition: Discharge to home  Recommendations to physicians: Patient will benefit from ACT due to multiple hospitalizations.  *Portions of this note was dictated using voice recognition software.  The note was reviewed prior to submission, spelling errors, similar sounding words can inadvertently be transcribed and overlooked words could occur. Please contact me if needed.  Electronically signed by: Mary U Ozoh, NP 08/02/2024 / 10:43 AM

## 2024-08-02 NOTE — ED Provider Notes (Signed)
 "  [facility] Provider Note    Event Date/Time   First MD Initiated Contact with Patient 08/02/24 1839     (approximate)   History   No chief complaint on file.   HPI  Vincent Frank is a 24 y.o. male with a history of major depressive disorder, borderline personality disorder, and nonepileptic seizures who presents with an intentional overdose.  The patient states that sometime before 5 PM today he took a large quantity of Wellbutrin and Benadryl .  The patient states that it was probably around 40 pills total although he is not sure.  He does not believe that there were any other medications.  He states he is also on fluoxetine  but did not take this today.  He denies any drugs or alcohol.  He states he was feeling very stressed.  He denies ongoing SI at this time.  He states that it feels like his head is spinning and he has a headache but he denies any other acute complaints.  I reviewed the past medical records.  The patient was just admitted at [facility] with worsening depression and intentional overdose on Tylenol  and bupropion.  He has had frequent prior psychiatric hospitalizations including at other outside hospitals in November and December.   Physical Exam   Triage Vital Signs: ED Triage Vitals [08/02/24 1839]  Encounter Vitals Group     BP      Girls Systolic BP Percentile      Girls Diastolic BP Percentile      Boys Systolic BP Percentile      Boys Diastolic BP Percentile      Pulse      Resp      Temp      Temp src      SpO2      Weight      Height      Head Circumference      Peak Flow      Pain Score 0     Pain Loc      Pain Education      Exclude from Growth Chart     Most recent vital signs: Vitals:   08/02/24 1839 08/02/24 2251  BP: (!) 155/97 (!) 148/89  Pulse: 97 97  Resp: (!) 25 (!) 22  Temp: 99 F (37.2 C)   SpO2: 100% 100%     General: Lethargic appearing but arousable to voice, no distress.  CV:  Good  peripheral perfusion.  Resp:  Normal effort.  Lungs CTAB. Abd:  No distention.  Other:  EOMI.  PERRLA.  Dilated pupils.  Normal speech.  Motor intact all extremities.  No visible trauma.   ED Results / Procedures / Treatments   Labs (all labs ordered are listed, but only abnormal results are displayed) Labs Reviewed  COMPREHENSIVE METABOLIC PANEL WITH GFR - Abnormal; Notable for the following components:      Result Value   Glucose, Bld 114 (*)    Calcium 10.5 (*)    Total Protein 8.5 (*)    Albumin 5.2 (*)    All other components within normal limits  CBC WITH DIFFERENTIAL/PLATELET - Abnormal; Notable for the following components:   MCV 73.7 (*)    MCH 24.3 (*)    All other components within normal limits  SALICYLATE LEVEL - Abnormal; Notable for the following components:   Salicylate Lvl <7.0 (*)    All other components within normal limits  ACETAMINOPHEN  LEVEL - Abnormal; Notable  for the following components:   Acetaminophen  (Tylenol ), Serum <10 (*)    All other components within normal limits  ETHANOL  URINE DRUG SCREEN  URINALYSIS, ROUTINE W REFLEX MICROSCOPIC  ACETAMINOPHEN  LEVEL     EKG  ED ECG REPORT I, Waylon Cassis, the attending physician, personally viewed and interpreted this ECG.  Date: 08/02/2024 EKG Time: 1842 Rate: 91 Rhythm: normal sinus rhythm (incorrectly read by machine as atrial fibrillation) QRS Axis: normal Intervals: Incomplete RBBB ST/T Wave abnormalities: normal Narrative Interpretation: no evidence of acute ischemia    RADIOLOGY    PROCEDURES:  Critical Care performed: Yes, see critical care procedure note(s)  .Critical Care  Performed by: Cassis Waylon, MD Authorized by: Cassis Waylon, MD   Critical care provider statement:    Critical care time (minutes):  30   Critical care time was exclusive of:  Separately billable procedures and treating other patients   Critical care was necessary to treat or prevent  imminent or life-threatening deterioration of the following conditions:  Toxidrome   Critical care was time spent personally by me on the following activities:  Development of treatment plan with patient or surrogate, discussions with consultants, evaluation of patient's response to treatment, examination of patient, ordering and review of laboratory studies, ordering and review of radiographic studies, ordering and performing treatments and interventions, pulse oximetry, re-evaluation of patient's condition, review of old charts and obtaining history from patient or surrogate    MEDICATIONS ORDERED IN ED: Medications  FLUoxetine  (PROZAC ) capsule 20 mg (20 mg Oral Given 08/02/24 2107)  OLANZapine  (ZYPREXA ) tablet 10 mg (10 mg Oral Given 08/02/24 2108)  activated charcoal-sorbitol  (ACTIDOSE-SORBITOL ) suspension 50 g (50 g Oral Given 08/02/24 1949)  ondansetron  (ZOFRAN ) injection 4 mg (4 mg Intravenous Given 08/02/24 2108)     IMPRESSION / MDM / ASSESSMENT AND PLAN / ED COURSE  I reviewed the triage vital signs and the nursing notes.  24 year old male with PMH as noted above presents after an intentional overdose with diphenhydramine  and bupropion per his history.  On exam he is lethargic appearing but arousable and oriented.  Vital signs are normal except for hypertension.  Neurologic exam is nonfocal.  Differential diagnosis includes, but is not limited to, intentional medication overdose, major depressive disorder, adjustment disorder.  The patient is on the cardiac monitor.  We will obtain lab workup including toxicology labs, and consult poison control for further recommendations.  I am placing the patient under involuntary commitment.  Once medically cleared disposition will be based on psychiatry team recommendations.  Patient's presentation is most consistent with acute presentation with potential threat to life or bodily function.  The patient is on the cardiac monitor to evaluate for evidence  of arrhythmia and/or significant heart rate changes.  The patient has been placed in psychiatric observation due to the need to provide a safe environment for the patient while obtaining psychiatric consultation and evaluation, as well as ongoing medical and medication management to treat the patient's condition.  The patient has been placed under full IVC at this time.   ----------------------------------------- 7:24 PM on 08/02/2024 -----------------------------------------  Poison control center recommends activated charcoal, 24 hours of observation, EKGs every 4 hours for a total of 3 EKGs, and repeat of the APAP level in 4 hours.  They also recommend benzodiazepines if the patient develops any seizure activity and to avoid phenytoin.  We will continue to observe the patient closely.  ----------------------------------------- 11:28 PM on 08/02/2024 -----------------------------------------  Repeat labs are pending.  Vital  signs are stable.  Based on additional history from the father, I have ordered the patient's home medications.  He is currently only on Zyprexa  and fluoxetine .  The patient remains alert although is having some hallucinations, making nonsensical statements.  He is redirectable.  He is not agitated.  There is no evidence of seizure activity.  We will continue to monitor closely.  I have signed the patient out to the oncoming ED physician Dr. Willo.   FINAL CLINICAL IMPRESSION(S) / ED DIAGNOSES   Final diagnoses:  Intentional overdose, initial encounter [facility])     Rx / DC Orders   ED Discharge Orders     None        Note:  This document was prepared using Dragon voice recognition software and may include unintentional dictation errors.    Jacolyn Pae, MD 08/02/24 2329  "

## 2024-08-02 NOTE — Progress Notes (Signed)
 [facility] Interim Discharge Summary  PCP: No primary care provider on file. Interim Discharge Details   Admit date:         07/28/2024 Discharge date:        08/02/2024  Hospital LOS:    5 days Code Status:   Full Code  Active Hospital Problems   Diagnosis Date Noted POA   *Major depressive disorder, recurrent 07/28/2024 Yes   Tachycardia 08/02/2024 Yes   Class 1 obesity due to excess calories without serious comorbidity with body mass index (BMI) of 31.0 to 31.9 in adult 08/02/2024 Not Applicable   Generalized abdominal pain 07/30/2024 Clinically Undetermined   Suicide attempt by acetaminophen  overdose (*) 07/29/2024 Yes   Toxic metabolic encephalopathy 07/27/2024 Yes   Post concussive syndrome 05/30/2024 Yes   History of psychogenic nonepileptic seizure 05/12/2023 Not Applicable   History of self mutilation 05/12/2023 Not Applicable    Resolved Hospital Problems   Diagnosis Date Noted Date Resolved POA   Atypical chest pain 07/30/2024 08/02/2024 Clinically Undetermined   Other constipation 07/30/2024 08/02/2024 Yes    Current infusions & medications:   FLUoxetine   20 mg Oral Daily   [START ON 08/03/2024] metoprolol  succinate  12.5 mg Oral Daily  PRN medications: bisacodyl  Rectal,dextrose  Oral **OR** dextrose  IntraVENous **OR** dextrose  IntraVENous **OR** glucagon Intramuscular,hydrOXYzine  HCl Oral,OLANZapine  Intramuscular,polyethylene glycol Oral    Medication List     CONTINUE taking these medications    FLUoxetine  20 mg capsule Commonly known as: PROZAC  Take one capsule (20 mg dose) by mouth daily.   hydrOXYzine  pamoate 25 mg capsule Commonly known as: VISTARIL          Where to Get Your Medications     These medications were sent to [id number] - [place], [place] - [address]  [address] [place], [place] [address]    Phone: [phone]  FLUoxetine  20 mg capsule       Interval Hospital Course  Physicians involved in care during this hospitalization Attending Provider: Jayson everitt Largo, MD Attending Provider: Lane JONELLE Pacific, MD Admitting Provider: Alm Sybil Dayhoff, MD Consulting Physician: Ed Consult To [employer] Consulting Physician: Athena Consult To Psychiatry Consulting Physician: Athena Consult To [facility] Consulting Physician: Sharyne Cy Kitty, NP  Indication for Admission: Depression  Interim Hospital Course/HPI   Vincent Frank is a 24 y.o. male involuntarily admitted to [facility] for worsening depression w/ recent intentional OD on Tylenol  & bupropion in the setting of psychiatric history of borderline personality, psychogenic nonepileptic seizures, MDD, PTSD, w/hx of multiple suicide attempts & freq psychiatric hospitalization.  This is the patient's third hospitalization in the last 2 months with suicide attempts.  The patient admitted to [place]  [facility] 11/16-19 with postconcussive encephalopathy, severe major depression and suicidal ideation.  Admitted at [facility] 11/30 to 12/5 with overdose of unknown medication.  Admitted at [facility] 12/18 to 12/20 with overdose on Wellbutrin.  Patient discharged 12/31 from [place] with a prescription for olanzapine . Date of admission through ED, patient called 911 reporting overdose on Tylenol  and Wellbutrin. ED Provider was able to contact family who states there are no missing Wellbutrin tablets. (Pt is not prescribed Wellbutrin & prev OD'd on his sister's) Patient presented to the ER around the 4-hour mark after taking Tylenol .  Labs: CO2 on BMP 22.  Initial Tylenol  level 193.  4-hour Tylenol  251.  Ethanol<10, salicylates<3.  UDS negative.  PTT  40.8, INR 1.2.  Poison control contacted by EDP, charcoal given.  Acetylcysteine infusion started.  He received Acetadote infusion, Acetaminophen  level trended down to <5. Poison control approved d/c of Acetadote drip.  ALT/AST 39/32 on admission to 22/17 on 1/2. Pt was deemed to be medically stable for transfer to [facility] for continued psychiatric hospitalization. NHICS is consulted for assistance w/management of medical comorbidities which include: psychogenic nonepileptic seizures, hx of self-mutilation, recent Tylenol  OD, and hx of prior concussion.  Medical hospital course as noted below.     Current Plans: Tachycardia/BPs intermittently elevated --Stable HR and BPs for the past couple of days, pt not discharged on BB due to it no longer being needed.  Pt not meeting BP or HR parameters at times.  Outpatient follow up with PCP for further monitoring and mgmt.   Microcytic Anemia Noted, stable, H/H 13.3/40.9, MCV 75.9, iron panel prev added to future labs, monitor CBC weekly & prn   Generalized Abd Pain-RESOLVED/  constipation Appears to be resolved, last BM:1/6.  Abd Xr notable for stool retention as above, cont miraLax    Atypical Chest Pain-RESOLVED. --1/4 CXR With no acute pulmonary disease.  remains afebrile, hemodynamically stable w/stable O2 Sats on RA.    History of psychogenic nonepileptic seizure Noted, recent Neuro Eval 06/27/24 w/vide EEG monitoring revealing normal findings No epileptiform discharges, no seizures, symptoms felt to be d/t Psychogenic nonepileptic seizures, EEG ws WNL, no seizure medications were felt to be required per neuro perspective at that time & Keppra  was discontinued. Recommend cont routine outpt f/u w/Neurology and or PCP for further monitoring   History of self mutilation Defer mgmt to psychiatry   Post concussive syndrome Defer mgmt to psychiatry   Toxic metabolic encephalopathy RESOLVED, afebrile, hemodynamically stable; 1/4 CXR neg, WBCs 7.3, UDS neg, ethanol level <10 on 07/27/24, UA negative   Major depressive disorder, recurrent Defer mgmt to psychiatry   Suicide attempt by acetaminophen  overdose (*) Defer mgmt to psychiatry, Tylenol  level trended to  undetectable, treated w/Acetadote infusion until cleared per poison control, LFTs remained normal throughout   Poor Nutritional Intake --Appetite stable for the past couple of days   Fluid and electrolyte disorder: Not present, present on admission  Plan: N/A   Abnormal blood counts: Anemia, present on admission Plan: See plan above  Class I obesity due to excess calories without serious comorbidity with BMI of 31.0-31.9 in adult  Nutritional status: Most recent BMI BMI 31.48.   Plan: PCP follow up, poor appetite on admission, but appetite stable now. Cont  Regular Diet   Coagulation defect: Not present, present on admission Plan: N/A   Elevated LFTs/transaminitis due to: Not present, present on admission Plan: N/A   Debility: Not present, present on admission Plan: N/A         DVT Prophylaxis: Padua Score 1, will order TEDs Ethics: Full Code Diet: Regular   Next of Kin is  Sharyne Odis Benne     [phone]      Discussed case with Health Care Provider:   RN and Consulting Physician   I have discussed the diagnoses and care plan with the patient.   Discussed case and formulated plan of care with Dr Sharin who agrees with the above.                 I have reviewed the patient's most recent labs and radiology studies.  Subjective Noted in chair, NAD.  Denied complaints of chest pain, shortness of breath, heart palpitation,  nausea, vomiting or abdominal pain.  Objective  Vitals:   08/01/24 0840 08/01/24 1921 08/02/24 0000 08/02/24 0731  BP: (!) 119/55 111/77 118/78 (!) 119/59  BP Location:  Right Upper Arm  Right Upper Arm  Patient Position:  Sitting  Lying  Pulse: 71 72 76 62  Resp:  18 18 20   Temp:  97.2 F (36.2 C) 97.2 F (36.2 C) (!) 96.3 F (35.7 C)  TempSrc:  Temporal  Temporal  SpO2:  96% 98% 97%  Weight:      Height:        Physical Exam General: Comfortable, no distress, alert, awake   Neck: Supple, No JVP, normal carotid upstrokes     Cardiac: Regular rate and rhythm, +S1S2, no rub, no gallop   Lungs: CTA of all other lung fields, unlabored breathing    Abd: Obese, soft, nontender, normal bowel sounds    Ext: No peripheral edema bilaterally   Neuro: non-focal, MAEx4  Bedside Procedures   No orders found     Documentation for time-based billing:  Total time spent of date of service was 25 minutes.  Patient care activities included preparing to see the patient such as reviewing the patient record, obtaining and/or reviewing separately obtained history, performing a medically appropriate history and physical examination, counseling and educating the patient, family, and/or caregiver, ordering prescription medications, tests, or procedures, referring and communicating with other health care providers when not separately reported during the visit, documenting clinical information in the electronic or other health record, independently interpreting results when not separately reported, communicating results to the patient/family/caregiver, and coordinating the care of the patient when not separately reported.

## 2024-08-02 NOTE — ED Notes (Signed)
 Spoke with [employer], recommends activated charcoal. Pt at an increased risk for seizures, QRS and QTC prolongation. Replace potassium, magnesium  and calcium. Recommends CCM and EKG q4 hours x3, 4 hour tylenol  level. Benzos as needs for seizures or agitation. IV fluids as needed, if widened QRS, if NA bicarb bolus of 1-2 amps. If more than 2 seizures, would recommend an EEG. Avoid phenantoin. Requires 24hours obs due to Wellbutrin being extended release.

## 2024-08-02 NOTE — ED Triage Notes (Addendum)
 Pt to ED BIB [facility] with c/o intentional overdose. Pt reported being under a lot of stress, wanted to just go to sleep hx of major depressive disorder. Reports taking a lot of Wellbutrin and benadryl . Time of ingestion estimated to be 1700. Pt lethargic but alert. Denies having thoughts of wanting to harm himself or anyone else.

## 2024-08-03 ENCOUNTER — Other Ambulatory Visit: Payer: Self-pay

## 2024-08-03 DIAGNOSIS — T43222A Poisoning by selective serotonin reuptake inhibitors, intentional self-harm, initial encounter: Secondary | ICD-10-CM | POA: Diagnosis present

## 2024-08-03 DIAGNOSIS — T50902A Poisoning by unspecified drugs, medicaments and biological substances, intentional self-harm, initial encounter: Secondary | ICD-10-CM | POA: Diagnosis present

## 2024-08-03 LAB — COMPREHENSIVE METABOLIC PANEL WITH GFR
ALT: 31 U/L (ref 0–44)
AST: 21 U/L (ref 15–41)
Albumin: 4.9 g/dL (ref 3.5–5.0)
Alkaline Phosphatase: 94 U/L (ref 38–126)
Anion gap: 11 (ref 5–15)
BUN: 12 mg/dL (ref 6–20)
CO2: 24 mmol/L (ref 22–32)
Calcium: 10 mg/dL (ref 8.9–10.3)
Chloride: 104 mmol/L (ref 98–111)
Creatinine, Ser: 1.15 mg/dL (ref 0.61–1.24)
GFR, Estimated: >60 mL/min
Glucose, Bld: 92 mg/dL (ref 70–99)
Potassium: 4.3 mmol/L (ref 3.5–5.1)
Sodium: 139 mmol/L (ref 135–145)
Total Bilirubin: 0.6 mg/dL (ref 0.0–1.2)
Total Protein: 8 g/dL (ref 6.5–8.1)

## 2024-08-03 LAB — TSH: TSH: 2.71 u[IU]/mL (ref 0.350–4.500)

## 2024-08-03 LAB — ACETAMINOPHEN LEVEL: Acetaminophen (Tylenol), Serum: <10 ug/mL — ABNORMAL LOW (ref 10–30)

## 2024-08-03 LAB — MAGNESIUM: Magnesium: 2 mg/dL (ref 1.7–2.4)

## 2024-08-03 LAB — PHOSPHORUS: Phosphorus: 5.7 mg/dL — ABNORMAL HIGH (ref 2.5–4.6)

## 2024-08-03 MED ORDER — ZOLPIDEM TARTRATE 5 MG PO TABS: 5.0000 mg | ORAL_TABLET | Freq: Every evening | ORAL | Status: DC | PRN

## 2024-08-03 MED ORDER — OLANZAPINE 10 MG PO TABS
10.0000 mg | ORAL_TABLET | Freq: Every day | ORAL | Status: DC
  Administered 2024-08-03 – 2024-08-08 (×6): 10 mg via ORAL
  Filled 2024-08-03 (×6): qty 1

## 2024-08-03 MED ORDER — POTASSIUM CHLORIDE CRYS ER 20 MEQ PO TBCR
40.0000 meq | EXTENDED_RELEASE_TABLET | Freq: Once | ORAL | Status: AC
  Administered 2024-08-03: 40 meq via ORAL
  Filled 2024-08-03: qty 2

## 2024-08-03 MED ORDER — ACETAMINOPHEN 325 MG PO TABS
650.0000 mg | ORAL_TABLET | Freq: Four times a day (QID) | ORAL | Status: DC | PRN
  Administered 2024-08-08 – 2024-08-09 (×4): 650 mg via ORAL
  Filled 2024-08-03 (×5): qty 2

## 2024-08-03 MED ORDER — MAGNESIUM OXIDE -MG SUPPLEMENT 400 (240 MG) MG PO TABS
400.0000 mg | ORAL_TABLET | Freq: Once | ORAL | Status: AC
  Administered 2024-08-03: 400 mg via ORAL
  Filled 2024-08-03: qty 1

## 2024-08-03 MED ORDER — ACETAMINOPHEN 650 MG RE SUPP: 650.0000 mg | Freq: Four times a day (QID) | RECTAL | Status: DC | PRN

## 2024-08-03 MED ORDER — METOCLOPRAMIDE HCL 5 MG/ML IJ SOLN
10.0000 mg | Freq: Four times a day (QID) | INTRAMUSCULAR | Status: DC | PRN
  Administered 2024-08-06 – 2024-08-08 (×2): 10 mg via INTRAVENOUS
  Filled 2024-08-03 (×2): qty 2

## 2024-08-03 MED ORDER — LORAZEPAM 2 MG/ML IJ SOLN
1.0000 mg | Freq: Once | INTRAMUSCULAR | Status: AC
  Administered 2024-08-03: 1 mg via INTRAVENOUS
  Filled 2024-08-03: qty 1

## 2024-08-03 MED ORDER — ENOXAPARIN SODIUM 40 MG/0.4ML IJ SOSY
40.0000 mg | PREFILLED_SYRINGE | INTRAMUSCULAR | Status: DC
  Administered 2024-08-03 – 2024-08-10 (×7): 40 mg via SUBCUTANEOUS
  Filled 2024-08-03 (×8): qty 0.4

## 2024-08-03 MED ORDER — QUETIAPINE FUMARATE 25 MG PO TABS
100.0000 mg | ORAL_TABLET | Freq: Every day | ORAL | Status: DC
  Administered 2024-08-03 – 2024-08-07 (×5): 100 mg via ORAL
  Filled 2024-08-03 (×5): qty 4

## 2024-08-03 MED ORDER — HYDROXYZINE HCL 25 MG PO TABS
25.0000 mg | ORAL_TABLET | Freq: Three times a day (TID) | ORAL | Status: DC | PRN
  Filled 2024-08-03: qty 1

## 2024-08-03 MED ORDER — SENNOSIDES-DOCUSATE SODIUM 8.6-50 MG PO TABS
1.0000 | ORAL_TABLET | Freq: Every evening | ORAL | Status: DC | PRN
  Filled 2024-08-03: qty 1

## 2024-08-03 MED ORDER — ENSURE PLUS HIGH PROTEIN PO LIQD
237.0000 mL | Freq: Two times a day (BID) | ORAL | Status: DC
  Administered 2024-08-05 – 2024-08-06 (×4): 237 mL via ORAL

## 2024-08-03 NOTE — ED Notes (Signed)
 Pt belongings stored on unit. 1 Bag: 1 white/red shirt, 1 padded black rugby undershirt, 1 yellow rugby ball, 1 pair socks, 1 pair yellow shoes, 1 blue cellphone  Pt wearing hospital gown, personal leggings, shorts and right knee brace

## 2024-08-03 NOTE — ED Notes (Signed)
 Spoke with [employer], suggests to replace potassium and mag to the high end of normal Potassium > 4 Mag >2  If persistently tachy, give fluids Can do aggressive benzo doses  You do see delayed seizures in Wellbutrin overdose

## 2024-08-03 NOTE — Progress Notes (Signed)
 Patient contacted through doximity dialer  Hello Vincent Frank, this is Josette GORMAN Baker, CMA from [facility]. I tried reaching you to check in on your progress after your recent visit, but I was unable to get in touch. Please give us  a call back at [phone] at your earliest convenience. Unfortunately, you cannot respond to this message at this time.  Thank you!

## 2024-08-03 NOTE — ED Notes (Signed)
 Pt denies SI/HI at this time.

## 2024-08-03 NOTE — Progress Notes (Signed)
 Spoke with poison control and update on pt given.  Poison control made aware of pt's tremors who requested that EKGs be continued q6h until any and all neuro symptoms subside. Dr Laurita updated on same.

## 2024-08-03 NOTE — Progress Notes (Signed)
 CARE COORDINATOR CONTACT WITH PATIENT/CAREGIVER AFTER HOSPITAL DISCHARGE   Unable to contact patient to encourage to schedule hospital follow up appointment with their Non NH PCP.  Left voice mail and sent mychart message with coordinator's contact information and availability.

## 2024-08-03 NOTE — H&P (Signed)
 " History and Physical    Vincent Frank FMW:[id number] DOB: 2001/06/28 DOA: 08/02/2024  PCP: [name], MD (Confirm with patient/family/NH records and if not entered, this has to be entered at [facility] point of entry) Patient coming from: Home  I have personally briefly reviewed patient's old medical records in [facility]  Chief Complaint: Feeling better  HPI: Vincent Frank is a 24 y.o. male with medical history significant of MDD, seizure disorder, presented with suicidal ideation and suicidal attempt with Wellbutrin and Benadryl  overdose.  Patient showed up in ED yesterday evening complaining of  feeling a lot of stress in life and took many pills of Wellbutrin and Benadryl  estimated time of ingestion was around 1700.  Patient was lethargic but oriented x 3.  No other complaints such as pain, no nauseous vomiting no headache or vision changes.  No fall at home.  ED Course: Vital signs are stable, showed QTc 683.  Poison Control Center was contacted who recommended IV hydration and supportive care and monitor for 24 hours.  Psychiatry consulted and patient was placed on IVC.  This morning patient reported feeling much better and denied any suicidal ideation.  AST 24 ALT 38 bicarb 22.  Review of Systems: As per HPI otherwise 14 point review of systems negative.    Past Medical History:  Diagnosis Date   Anxiety    Depression    Major depressive disorder    Seizures (HCC)     Past Surgical History:  Procedure Laterality Date   NO PAST SURGERIES       reports that he has never smoked. He has never used smokeless tobacco. He reports that he does not currently use alcohol. He reports that he does not currently use drugs.  Allergies[1]  Family History  Problem Relation Age of Onset   Healthy Mother      Prior to Admission medications  Medication Sig Start Date End Date Taking? Authorizing Provider  ARIPiprazole  (ABILIFY ) 10 MG tablet Take 10 mg by mouth  daily. 06/23/24  Yes [provider]  FLUoxetine  (PROZAC ) 20 MG capsule Take 3 capsules (60 mg total) by mouth daily. 08/04/23  Yes Lenard Calin, MD  hydrOXYzine  (VISTARIL ) 25 MG capsule Take 25 mg by mouth every 8 (eight) hours as needed.   Yes [provider]  OLANZapine  (ZYPREXA ) 5 MG tablet Take 10 mg by mouth at bedtime. 07/26/24  Yes [provider]  QUEtiapine  (SEROQUEL ) 100 MG tablet Take 100 mg by mouth at bedtime. 06/23/24  Yes [provider]  ARIPiprazole  (ABILIFY ) 15 MG tablet Take 1 tablet (15 mg total) by mouth daily. Patient not taking: Reported on 05/31/2024 08/04/23   Lenard Calin, MD  ARIPiprazole  (ABILIFY ) 5 MG tablet Take 1 tablet (5 mg total) by mouth at bedtime for 14 days. Patient not taking: Reported on 05/31/2024 06/05/23 05/31/24  Johny Lot, MD  FLUoxetine  (PROZAC ) 20 MG capsule Take 3 capsules (60 mg total) by mouth daily for 14 days. Patient not taking: Reported on 05/31/2024 06/06/23 05/31/24  Johny Lot, MD  hydrOXYzine  (ATARAX ) 25 MG tablet Take 1 tablet (25 mg total) by mouth 3 (three) times daily as needed for anxiety. Patient not taking: Reported on 05/31/2024 06/05/23   Johny Lot, MD  hydrOXYzine  (ATARAX ) 25 MG tablet Take 1 tablet (25 mg total) by mouth 3 (three) times daily as needed for anxiety. Patient not taking: Reported on 05/31/2024 08/03/23   Lenard Calin, MD  lamoTRIgine  (LAMICTAL  XR) 50 MG 24  hour tablet Take 1 tablet (50 mg total) by mouth daily for 3 days, THEN 2 tablets (100 mg total) daily for 14 days. Patient not taking: Reported on 05/31/2024 08/04/23 05/31/24  Lenard Calin, MD  lamoTRIgine  (LAMICTAL ) 25 MG tablet Take 1 tablet (25 mg total) by mouth daily for 14 days. Patient not taking: Reported on 05/31/2024 06/06/23 05/31/24  Johny Lot, MD  naproxen  (NAPROSYN ) 500 MG tablet Take 1 tablet (500 mg total) by mouth 2 (two) times daily. Patient not taking: Reported on 05/31/2024  07/12/23   Charlyn Sora, MD  oxybutynin  (DITROPAN ) 5 MG tablet Take 1 tablet (5 mg total) by mouth 3 (three) times daily. Patient not taking: Reported on 05/31/2024 07/12/23   Charlyn Sora, MD  oxyCODONE -acetaminophen  (PERCOCET/ROXICET) 5-325 MG tablet Take 1 tablet by mouth every 8 (eight) hours as needed for severe pain (pain score 7-10). Patient not taking: Reported on 05/31/2024 07/12/23   Charlyn Sora, MD  traZODone  (DESYREL ) 50 MG tablet Take 1 tablet (50 mg total) by mouth at bedtime. Patient not taking: Reported on 05/31/2024 06/05/23   Johny Lot, MD    Physical Exam: Vitals:   08/03/24 0600 08/03/24 0622 08/03/24 0641 08/03/24 0710  BP:  134/81    Pulse: 97 95    Resp: (!) 21 20    Temp:   98 F (36.7 C)   TempSrc:      SpO2: 97% 98%  100%    Constitutional: NAD, calm, comfortable Vitals:   08/03/24 0600 08/03/24 0622 08/03/24 0641 08/03/24 0710  BP:  134/81    Pulse: 97 95    Resp: (!) 21 20    Temp:   98 F (36.7 C)   TempSrc:      SpO2: 97% 98%  100%   Eyes: PERRL, lids and conjunctivae normal ENMT: Mucous membranes are moist. Posterior pharynx clear of any exudate or lesions.Normal dentition.  Neck: normal, supple, no masses, no thyromegaly Respiratory: clear to auscultation bilaterally, no wheezing, no crackles. Normal respiratory effort. No accessory muscle use.  Cardiovascular: Regular rate and rhythm, no murmurs / rubs / gallops. No extremity edema. 2+ pedal pulses. No carotid bruits.  Abdomen: no tenderness, no masses palpated. No hepatosplenomegaly. Bowel sounds positive.  Musculoskeletal: no clubbing / cyanosis. No joint deformity upper and lower extremities. Good ROM, no contractures. Normal muscle tone.  Skin: no rashes, lesions, ulcers. No induration Neurologic: CN 2-12 grossly intact. Sensation intact, DTR normal. Strength 5/5 in all 4.  Psychiatric: Normal judgment and insight. Alert and oriented x 3. Normal mood.     Labs on  Admission: I have personally reviewed following labs and imaging studies  CBC: Recent Labs  Lab 08/02/24 1847  WBC 7.7  NEUTROABS 5.6  HGB 13.5  HCT 41.0  MCV 73.7*  PLT 287   Basic Metabolic Panel: Recent Labs  Lab 08/02/24 1847 08/03/24 0818  NA 139 139  K 3.8 4.3  CL 101 104  CO2 22 24  GLUCOSE 114* 92  BUN 15 12  CREATININE 1.13 1.15  CALCIUM 10.5* 10.0  MG 2.0  --   PHOS  --  5.7*   GFR: CrCl cannot be calculated (Unknown ideal weight.). Liver Function Tests: Recent Labs  Lab 08/02/24 1847 08/03/24 0818  AST 24 21  ALT 38 31  ALKPHOS 98 94  BILITOT 0.4 0.6  PROT 8.5* 8.0  ALBUMIN 5.2* 4.9   No results for input(s): LIPASE, AMYLASE in the last 168 hours. No results for input(s):  AMMONIA  in the last 168 hours. Coagulation Profile: No results for input(s): INR, PROTIME in the last 168 hours. Cardiac Enzymes: No results for input(s): CKTOTAL, CKMB, CKMBINDEX, TROPONINI in the last 168 hours. BNP (last 3 results) No results for input(s): PROBNP in the last 8760 hours. HbA1C: No results for input(s): HGBA1C in the last 72 hours. CBG: No results for input(s): GLUCAP in the last 168 hours. Lipid Profile: No results for input(s): CHOL, HDL, LDLCALC, TRIG, CHOLHDL, LDLDIRECT in the last 72 hours. Thyroid Function Tests: No results for input(s): TSH, T4TOTAL, FREET4, T3FREE, THYROIDAB in the last 72 hours. Anemia Panel: No results for input(s): VITAMINB12, FOLATE, FERRITIN, TIBC, IRON, RETICCTPCT in the last 72 hours. Urine analysis:    Component Value Date/Time   COLORURINE YELLOW 11/10/2023 1510   APPEARANCEUR CLEAR 11/10/2023 1510   LABSPEC 1.020 11/10/2023 1510   PHURINE 6.0 11/10/2023 1510   GLUCOSEU NEGATIVE 11/10/2023 1510   HGBUR NEGATIVE 11/10/2023 1510   BILIRUBINUR NEGATIVE 11/10/2023 1510   KETONESUR NEGATIVE 11/10/2023 1510   PROTEINUR NEGATIVE 11/10/2023 1510   NITRITE NEGATIVE  11/10/2023 1510   LEUKOCYTESUR NEGATIVE 11/10/2023 1510    Radiological Exams on Admission: No results found.  EKG: Independently reviewed.  Repeat EKG this morning showed downtrending of QTc 683> 466.  Assessment/Plan Principal Problem:   Suicidal overdose, initial encounter [facility]) Active Problems:   SSRI overdose, intentional self-harm, initial encounter (HCC)   Intentional SSRI (selective serotonin reuptake inhibitor) overdose (HCC)  (please populate well all problems here in Problem List. (For example, if patient is on BP meds at home and you resume or decide to hold them, it is a problem that needs to be her. Same for CAD, COPD, HLD and so on)  SSRI overdose Benadryl  overdose Suicidal ideation and suicidal attempt - Patient denied any suicidal ideations morning. -Neurologic exam benign, neurological symptoms nonfocal, no symptoms or signs of serotonin syndrome. - IVC in place - One-to-one for safety for today - Waiting for inpatient psychiatry evaluation.  Expect patient discharged to inpatient psychiatry unit.  Prolonged Qtc - Resolved, repeat EKG showed QTc 466.  Major depression disorder - Defer to psychiatry for when to resume psychiatric medications.  DVT prophylaxis: SCD Code Status: Full code Family Communication: None at bedside Disposition Plan: Expect less than 2 midnight hospital stay Consults called: Inpatient psychiatry Admission status: Telemetry observation   Cort ONEIDA Mana MD [facility] Pager [phone]  08/03/2024, 9:24 AM       [1]  Allergies Allergen Reactions   Bee Venom Anaphylaxis   Gabapentin  Other (See Comments) and Anaphylaxis    Muscle twitching   Haldol [Haloperidol] Anaphylaxis and Other (See Comments)    Per father, dystonic reaction with  muscle contractures- also   Hornet Venom Anaphylaxis and Itching   Peanut-Containing Drug Products Anaphylaxis   Wasp Venom Anaphylaxis   Beeswax Swelling   "

## 2024-08-03 NOTE — Plan of Care (Signed)
" °  Problem: Education: Goal: Knowledge of General Education information will improve Description: Including pain rating scale, medication(s)/side effects and non-pharmacologic comfort measures Outcome: Progressing   Problem: Clinical Measurements: Goal: Will remain free from infection Outcome: Progressing Goal: Diagnostic test results will improve Outcome: Progressing Goal: Cardiovascular complication will be avoided Outcome: Progressing   Problem: Clinical Measurements: Goal: Respiratory complications will improve Outcome: Not Applicable   "

## 2024-08-03 NOTE — ED Provider Notes (Signed)
----------------------------------------- °  2:19 AM on 08/03/2024 ----------------------------------------- Repeat EKG now with QT prolongation, new compared to previous.  Patient's QTc is 683, will give supplemental potassium given initial potassium found to be 3.8.  We will also add on magnesium  level.  Patient does have increasing hallucinations, but without significant agitation.  Will give dose of IV Ativan  and update poison control on EKG changes.  ----------------------------------------- 4:20 AM on 08/03/2024 ----------------------------------------- Poison control continues to recommend benzodiazepines as needed, also recommends keeping potassium and magnesium  levels at high normal.  Given need for prolonged observation and prolonged QT on EKG, case discussed with hospitalist for admission.  ED ECG REPORT I, Carlin Palin, the attending physician, personally viewed and interpreted this ECG.   Date: 08/03/2024  EKG Time: 2:05  Rate: 109  Rhythm: normal EKG, normal sinus rhythm, unchanged from previous tracings  Axis: Normal  Intervals:Prolonged QT  ST&T Change: None  CRITICAL CARE Performed by: Carlin Palin   Total critical care time: 30 minutes  Critical care time was exclusive of separately billable procedures and treating other patients.  Critical care was necessary to treat or prevent imminent or life-threatening deterioration.  Critical care was time spent personally by me on the following activities: development of treatment plan with patient and/or surrogate as well as nursing, discussions with consultants, evaluation of patient's response to treatment, examination of patient, obtaining history from patient or surrogate, ordering and performing treatments and interventions, ordering and review of laboratory studies, ordering and review of radiographic studies, pulse oximetry and re-evaluation of patient's condition.     Palin Carlin, MD 08/03/24 [phone]

## 2024-08-04 DIAGNOSIS — T50902A Poisoning by unspecified drugs, medicaments and biological substances, intentional self-harm, initial encounter: Secondary | ICD-10-CM | POA: Diagnosis not present

## 2024-08-04 LAB — URINE DRUG SCREEN
Amphetamines: NEGATIVE
Barbiturates: NEGATIVE
Benzodiazepines: POSITIVE — AB
Cocaine: NEGATIVE
Fentanyl: NEGATIVE
Methadone Scn, Ur: NEGATIVE
Opiates: NEGATIVE
Tetrahydrocannabinol: NEGATIVE

## 2024-08-04 LAB — BASIC METABOLIC PANEL WITH GFR
Anion gap: 13 (ref 5–15)
BUN: 19 mg/dL (ref 6–20)
CO2: 25 mmol/L (ref 22–32)
Calcium: 9.6 mg/dL (ref 8.9–10.3)
Chloride: 103 mmol/L (ref 98–111)
Creatinine, Ser: 1.37 mg/dL — ABNORMAL HIGH (ref 0.61–1.24)
GFR, Estimated: >60 mL/min
Glucose, Bld: 100 mg/dL — ABNORMAL HIGH (ref 70–99)
Potassium: 4.2 mmol/L (ref 3.5–5.1)
Sodium: 140 mmol/L (ref 135–145)

## 2024-08-04 LAB — URINALYSIS, ROUTINE W REFLEX MICROSCOPIC
Bilirubin Urine: NEGATIVE
Glucose, UA: NEGATIVE mg/dL
Hgb urine dipstick: NEGATIVE
Ketones, ur: NEGATIVE mg/dL
Leukocytes,Ua: NEGATIVE
Nitrite: NEGATIVE
Protein, ur: NEGATIVE mg/dL
Specific Gravity, Urine: 1.025 (ref 1.005–1.030)
pH: 5 (ref 5.0–8.0)

## 2024-08-04 LAB — HIV ANTIBODY (ROUTINE TESTING W REFLEX): HIV Screen 4th Generation wRfx: NONREACTIVE

## 2024-08-04 LAB — MAGNESIUM: Magnesium: 2.2 mg/dL (ref 1.7–2.4)

## 2024-08-04 LAB — CBC
HCT: 41.7 % (ref 39.0–52.0)
Hemoglobin: 13.3 g/dL (ref 13.0–17.0)
MCH: 24.5 pg — ABNORMAL LOW (ref 26.0–34.0)
MCHC: 31.9 g/dL (ref 30.0–36.0)
MCV: 76.9 fL — ABNORMAL LOW (ref 80.0–100.0)
Platelets: 275 K/uL (ref 150–400)
RBC: 5.42 MIL/uL (ref 4.22–5.81)
RDW: 14.5 % (ref 11.5–15.5)
WBC: 7.2 K/uL (ref 4.0–10.5)
nRBC: 0 % (ref 0.0–0.2)

## 2024-08-04 MED ORDER — SODIUM CHLORIDE 0.9 % IV SOLN: INTRAVENOUS | Status: DC

## 2024-08-04 NOTE — Plan of Care (Signed)

## 2024-08-04 NOTE — Progress Notes (Signed)
 " PROGRESS NOTE    Vincent Frank  FMW:[id number] DOB: [birth year] DOA: 08/02/2024 PCP: Vincent Lame, MD    Brief Narrative:  24 y.o. male with medical history significant of MDD, seizure disorder, presented with suicidal ideation and suicidal attempt with Wellbutrin and Benadryl  overdose.   Patient showed up in ED yesterday evening complaining of  feeling a lot of stress in life and took many pills of Wellbutrin and Benadryl  estimated time of ingestion was around 1700.  Patient was lethargic but oriented x 3.  No other complaints such as pain, no nauseous vomiting no headache or vision changes.  No fall at home.   Assessment & Plan:   Principal Problem:   Suicidal overdose, initial encounter (HCC) Active Problems:   SSRI overdose, intentional self-harm, initial encounter (HCC)   Intentional SSRI (selective serotonin reuptake inhibitor) overdose (HCC)   SSRI overdose Benadryl  overdose Suicidal ideation and suicidal attempt Neurologic exam relatively benign however does have some persistent tremors Plan: Continue IVC Continue one-to-one sitter Psychiatry evaluate today  AKI Urinary retention Likely secondary to ingestion of anticholinergics Plan: Aggressive IV fluids Bladder scans    Prolonged Qtc Tremors Repeat EKG with improved QTc within reference range.  Patient still having tremors. Plan: Continue every 6 hour EKG until tremors resolve Aggressive IV fluids as above   Major depression disorder Psychiatry consult IVC     DVT prophylaxis: SCD Code Status: Full Family Communication: None at bedside.  Patient removed all family members from contact lists Disposition Plan: Status is: Observation The patient will require care spanning > 2 midnights and should be moved to inpatient because: AKI requiring IV fluids   Level of care: Telemetry  Consultants:  Psychiatry  Procedures:  None  Antimicrobials: None   Subjective: Seen and examined.   Resting in bed.  Answers all questions appropriately.  Blunted affect.  Tremulous.  No pain complaints.  Objective: Vitals:   08/03/24 1945 08/03/24 2348 08/04/24 0353 08/04/24 0804  BP: 136/81 (!) 97/56 113/67 117/67  Pulse: 80 84 76 69  Resp: 18 16 17 16   Temp: 97.8 F (36.6 C) 97.9 F (36.6 C) 97.8 F (36.6 C) 98.4 F (36.9 C)  TempSrc:    Oral  SpO2: 97% 97% 99% 99%  Weight:      Height:        Intake/Output Summary (Last 24 hours) at 08/04/2024 1134 Last data filed at 08/04/2024 0931 Gross per 24 hour  Intake 600 ml  Output 700 ml  Net -100 ml   Filed Weights   08/03/24 1546  Weight: 103 kg    Examination:  General exam: Appears tremulous and lethargic Respiratory system: Clear to auscultation. Respiratory effort normal. Cardiovascular system: S1-S2, RRR, no murmurs, no pedal edema Gastrointestinal system:, Soft, NT/ND, normal bowel sounds Central nervous system: Alert and oriented. No focal neurological deficits. Extremities: Symmetric 5 x 5 power. Skin: No rashes, lesions or ulcers Psychiatry: Judgement and insight appear normal. Mood & affect blunted.     Data Reviewed: I have personally reviewed following labs and imaging studies  CBC: Recent Labs  Lab 08/02/24 1847 08/04/24 0522  WBC 7.7 7.2  NEUTROABS 5.6  --   HGB 13.5 13.3  HCT 41.0 41.7  MCV 73.7* 76.9*  PLT 287 275   Basic Metabolic Panel: Recent Labs  Lab 08/02/24 1847 08/03/24 0818 08/04/24 0522  NA 139 139 140  K 3.8 4.3 4.2  CL 101 104 103  CO2 22 24 25   GLUCOSE 114*  92 100*  BUN 15 12 19   CREATININE 1.13 1.15 1.37*  CALCIUM 10.5* 10.0 9.6  MG 2.0  --  2.2  PHOS  --  5.7*  --    GFR: Estimated Creatinine Clearance: 110.9 mL/min (A) (by C-G formula based on SCr of 1.37 mg/dL (H)). Liver Function Tests: Recent Labs  Lab 08/02/24 1847 08/03/24 0818  AST 24 21  ALT 38 31  ALKPHOS 98 94  BILITOT 0.4 0.6  PROT 8.5* 8.0  ALBUMIN 5.2* 4.9   No results for input(s):  LIPASE, AMYLASE in the last 168 hours. No results for input(s): AMMONIA  in the last 168 hours. Coagulation Profile: No results for input(s): INR, PROTIME in the last 168 hours. Cardiac Enzymes: No results for input(s): CKTOTAL, CKMB, CKMBINDEX, TROPONINI in the last 168 hours. BNP (last 3 results) No results for input(s): PROBNP in the last 8760 hours. HbA1C: No results for input(s): HGBA1C in the last 72 hours. CBG: No results for input(s): GLUCAP in the last 168 hours. Lipid Profile: No results for input(s): CHOL, HDL, LDLCALC, TRIG, CHOLHDL, LDLDIRECT in the last 72 hours. Thyroid Function Tests: Recent Labs    08/03/24 0818  TSH 2.710   Anemia Panel: No results for input(s): VITAMINB12, FOLATE, FERRITIN, TIBC, IRON, RETICCTPCT in the last 72 hours. Sepsis Labs: No results for input(s): PROCALCITON, LATICACIDVEN in the last 168 hours.  No results found for this or any previous visit (from the past 240 hours).       Radiology Studies: No results found.      Scheduled Meds:  enoxaparin  (LOVENOX ) injection  40 mg Subcutaneous Q24H   feeding supplement  237 mL Oral BID BM   FLUoxetine   20 mg Oral Daily   OLANZapine   10 mg Oral QHS   QUEtiapine   100 mg Oral QHS   Continuous Infusions:  sodium chloride  150 mL/hr at 08/04/24 1113     LOS: 0 days     Vincent KATHEE Robson, MD [facility]   If 7PM-7AM, please contact night-coverage  08/04/2024, 11:34 AM   "

## 2024-08-04 NOTE — Progress Notes (Signed)
 VSS stable through out this shift. Patient was started on IVF. Hand tremors are improving. Patient continues on q6 hrs EKGs and q4 bladder scans. Last bladder scan was 317 ml at 1540 with no c/o of any pressure.- MD was notified. Poison control was updated.

## 2024-08-04 NOTE — Consult Note (Signed)
 [facility] Psychiatric Consult Initial  Patient Name: .Vincent Frank  MRN: [id number]  DOB: 2000-09-04  Consult Order details:  Orders (From admission, onward)     Start     Ordered   08/02/24 1841  CONSULT TO CALL ACT TEAM       Ordering Provider: Jacolyn Pae, MD  Provider:  (Not yet assigned)  Question:  Reason for Consult?  Answer:  Psych consult   08/02/24 1841   08/02/24 1841  IP CONSULT TO PSYCHIATRY       Ordering Provider: Jacolyn Pae, MD  Provider:  (Not yet assigned)  Question Answer Comment  Reason for consult: Other (see comments)   Comments: Intentional overdose      08/02/24 1841             Mode of Visit: In person    Psychiatry Consult Evaluation  Service Date: August 04, 2024 LOS:  LOS: 0 days  Chief Complaint Intentional overdose  Primary Psychiatric Diagnoses  Suicidal overdose   Assessment   Patient is recommended for inpatient psychiatric admission following medical clearance based on significant clinical concerns. Patient presents with active and repeated suicide attempts, including the current presentation for intentional overdose on Benadryl  and possibly Wellbutrin, occurring shortly after a recent intentional Tylenol  overdose approximately one week ago on July 27, 2024. This pattern of repeated suicide attempts within a short timeframe represents imminent and ongoing danger to self requiring intensive psychiatric intervention in a secure, monitored environment.  Patient has extensive psychiatric history including major depressive disorder, PTSD, likely borderline personality disorder, psychogenic nonepileptic seizures, and documented history of multiple suicide attempts over time. Patient's mental health conditions are currently untreated, as he has no established psychiatrist or medication management provider. Patient has only attended one therapy session at [facility], indicating he is in the very early  stages of establishing outpatient mental health support and does not yet have adequate therapeutic relationship or coping skills developed to maintain safety in the community.  Patient identifies family conflict as a major trigger for his suicidal behavior and describes living in a chronically stressful home environment with mother, father, and sister, with whom he reports strained relationships and feelings of being less valued than his sister. Patient's living situation appears to be a significant ongoing stressor that contributes to his suicidal ideation and behavior. Patient explicitly does not want family involved in his care at this time.  Patient has limited treatment response history, having tried multiple medication trials (Prozac , Abilify  which worsened suicidal thoughts) and ECT treatment approximately 2 years ago that provided only temporary relief for a couple of months before depression returned. 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  Patient is currently unemployed due to mental health factors, which may contribute to feelings of worthlessness, hopelessness, and lack of purpose that can worsen depression and increase suicide risk. Patient has minimal outpatient support system established and no current psychiatric medication management, leaving him without adequate safety net or treatment resources in the community.  Inpatient psychiatric admission will provide safe, secure environment for suicide prevention and intensive monitoring during this high-risk period with pattern of repeated attempts, comprehensive psychiatric evaluation and diagnostic clarification, including assessment for borderline personality disorder given chart documentation and pattern of repeated suicide attempts in context of interpersonal stressors, medication initiation and  optimization for treatment-resistant depression, including consideration of medication combinations or novel approaches, establishment of intensive individual therapy to address underlying depression,  PTSD, family conflict, and maladaptive coping patterns, dialectical behavior therapy (DBT) or other evidence-based interventions if borderline personality disorder is confirmed, to address emotion dysregulation and self-harm behaviors.  The recommendation for inpatient psychiatric admission following medical clearance was discussed with both patient and the primary medical team. Patient appeared to understand the recommendation and rationale for this level of care. Patient will remain medically hospitalized until cleared by the primary medical team, at which time he will be transferred to an inpatient psychiatric facility for continued evaluation, stabilization, and comprehensive treatment of his treatment-resistant depression, repeated suicide attempts, and complex psychiatric needs.     Diagnoses:  Active Hospital problems: Principal Problem:   Suicidal overdose, initial encounter [facility]) Active Problems:   SSRI overdose, intentional self-harm, initial encounter (HCC)   Intentional SSRI (selective serotonin reuptake inhibitor) overdose (HCC)    Plan   ## Psychiatric Medication Recommendations:  Will defer further management to inpatient psychiatric unit  ## Medical Decision Making Capacity: Not specifically addressed in this encounter  ## Further Work-up:   -- most recent EKG on 08/03/2024 had QtC of 457 -- Pertinent labwork reviewed earlier this admission includes: BMP, CBC, TSH, CMP, acetaminophen  level, ethanol, salicylate level, CBC   ## Disposition:-- Patient is under IVC and recommended for inpatient psychiatric admission after medical stabilization  ## Behavioral / Environmental: - No specific recommendations at this time.     ## Safety and Observation Level:  - Based on my  clinical evaluation, I estimate the patient to be at moderate risk of self harm in the current setting. - At this time, we recommend  1:1 Observation. This decision is based on my review of the chart including patient's history and current presentation, interview of the patient, mental status examination, and consideration of suicide risk including evaluating suicidal ideation, plan, intent, suicidal or self-harm behaviors, risk factors, and protective factors. This judgment is based on our ability to directly address suicide risk, implement suicide prevention strategies, and develop a safety plan while the patient is in the clinical setting. Please contact our team if there is a concern that risk level has changed.  CSSR Risk Category:C-SSRS RISK CATEGORY: High Risk  Suicide Risk Assessment: Patient has following modifiable risk factors for suicide: lack of access to outpatient mental health resources, current symptoms: anxiety/panic, insomnia, impulsivity, anhedonia, hopelessness, triggering events, and recent psychiatric hospitalization, which we are addressing by recommending inpatient psychiatric admission for further monitoring and stabilization. Patient has following non-modifiable or demographic risk factors for suicide: male gender, history of suicide attempt, and psychiatric hospitalization Patient has the following protective factors against suicide: Access to outpatient mental health care  Thank you for this consult request. Recommendations have been communicated to the primary team.  We will continue to round while medically admitted at this time.   Zelda Sharps, NP        History of Present Illness  Relevant Aspects of [facility]   Patient Report:  Patient was assessed by psychiatry today. Patient endorsed intentionally overdosing on medications in an attempt to harm himself due to family conflict. Patient reported he knows he intentionally took Benadryl  and stated he may have  also taken Wellbutrin along with this, but reported he was honestly unsure about the Wellbutrin. Of note, chart review reveals patient has a recent intentional overdose on Tylenol  documented around July 27, 2024 of this year. Patient endorsed multiple suicide attempts in the past.  Patient reported that family is a major trigger for him and stated they  know how to trigger me. Patient expressed he does not want family involved in his care currently. Patient reported he lives at home with his mother, father, and sister. He referred to his sister as the golden child and endorsed that relationships with all of his family members are strained, suggesting feelings of being less favored or valued within the family dynamic.  Patient reported he currently sees a therapist through [facility] but has only attended 1 session thus far. However, patient stated he does not see a psychiatrist or medication management provider at this time. Patient reported his Wellbutrin prescription is from a long time ago and that PCPs have just continued the medication. Patient reported in the past he has trialed Prozac  and Abilify , with Abilify  reportedly worsening his suicidal thoughts. Patient also reported completing 12 sessions of electroconvulsive therapy (ECT) approximately 2 years ago, which he stated helped for a couple of months, but his depression returned after that period.  Patient endorsed previous mental health history of major depressive disorder and post-traumatic stress disorder. Through chart review, there was mention of potential borderline personality disorder. However, patient did not list this diagnosis during today's assessment. Patient endorsed history of seizures but reported his last known seizure was a couple of years ago. Chart review of recent admission notes documented through [facility] lists patient's history as including borderline personality  disorder, psychogenic nonepileptic seizures, major depressive disorder, PTSD, and history of multiple suicide attempts.  Patient endorsed rare alcohol use. He denied any other recreational drug use or tobacco use. Patient reported previous inpatient psychiatric admissions, with the most recent being what he described as last week when he intentionally overdosed on Tylenol . Patient denied any access to firearms or weapons and denied any upcoming known legal charges. Patient reported he is not currently employed, as he had to leave the workforce due to many factors including his mental health struggles.  Psych ROS:  Depression: Endorsed Anxiety: Endorsed Mania (lifetime and current): Denied Psychosis: (lifetime and current): Denied  Collateral information:  Patient declined to provide contact for collateral information at this time    Psychiatric and Social History  Psychiatric History:  Information collected from patient/chart review  Prev Dx/Sx: MDD, borderline personality disorder, PTSD, multiple previous suicide attempts Current Psych Provider: Denied Home Meds (current): Wellbutrin Previous Med Trials: Abilify , Prozac  Therapy: Yes  Prior Psych Hospitalization: Yes Prior Self Harm: Yes Prior Violence: Denied  Family Psych History: Reported brother has history of bipolar disorder Family Hx suicide: Denied  Social History:   Educational Hx: Some college 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 Hx: Denied Living Situation: With mother, father and sister  Access to weapons/lethal means: Denied access to firearms  Substance History Alcohol: Endorsed occasional use History of alcohol withdrawal seizures denied History of DT's denied Tobacco: Denied Illicit drugs: Denied Prescription drug abuse: Denied Rehab hx: Denied  Exam Findings  Physical Exam: Reviewed and agree with the physical exam findings conducted by the medical provider Vital Signs:  Temp:  [97.7 F  (36.5 C)-98.4 F (36.9 C)] 98.4 F (36.9 C) (01/09 0804) Pulse Rate:  [69-84] 69 (01/09 0804) Resp:  [16-18] 16 (01/09 0804) BP: (97-136)/(56-81) 117/67 (01/09 0804) SpO2:  [97 %-99 %] 99 % (01/09 0804) Weight:  [103 kg] 103 kg (01/08 1546) Blood pressure 117/67, pulse 69, temperature 98.4 F (36.9 C), temperature source Oral, resp. rate 16, height 6' 3 (1.905 m), weight 103 kg, SpO2 99%. Body mass index is 28.37 kg/m.  Mental Status Exam: General Appearance: Fairly Groomed  Orientation:  Full (Time, Place, and Person)  Memory:  Fair  Concentration:  Concentration: Fair  Recall:  Fair  Attention  Fair  Eye Contact:  Minimal  Speech:  Clear and Coherent  Language:  Fair  Volume:  Normal  Mood: Depressed  Affect:  Depressed  Thought Process:  Coherent and Linear  Thought Content:  Logical  Suicidal Thoughts:  Yes.  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  Family History:  The patient's family history includes Healthy in his mother.  Medical History: Past Medical History:  Diagnosis Date   Anxiety    Depression    Major depressive disorder    Seizures (HCC)     Surgical History: Past Surgical History:  Procedure Laterality Date   NO PAST SURGERIES       Medications:  Current Medications[1]  Allergies: Allergies[2]  Zelda Sharps, NP This note was created using Dragon dictation software. Please excuse any inadvertent transcription errors. Case was discussed with supervising physician Dr. Jadapalle who is agreeable with  current plan.       [1]  Current Facility-Administered Medications:    0.9 %  sodium chloride  infusion, , Intravenous, Continuous, Sreenath, Sudheer B, MD, Last Rate: 150 mL/hr at 08/04/24 1113, Rate Change at 08/04/24 1113   acetaminophen  (TYLENOL ) tablet 650 mg, 650 mg, Oral, Q6H PRN **OR** acetaminophen  (TYLENOL ) suppository 650 mg, 650 mg, Rectal, Q6H PRN, Laurita Manor T, MD   enoxaparin  (LOVENOX ) injection 40 mg, 40 mg, Subcutaneous, Q24H, Zhang, Ping T, MD, 40 mg at 08/03/24 2220   feeding supplement (ENSURE PLUS HIGH PROTEIN) liquid 237 mL, 237 mL, Oral, BID BM, Laurita Manor T, MD   FLUoxetine  (PROZAC ) capsule 20 mg, 20 mg, Oral, Daily, Siadecki, Sebastian, MD, 20 mg at 08/04/24 0849   hydrOXYzine  (ATARAX ) tablet 25 mg, 25 mg, Oral, Q8H PRN, Laurita Manor T, MD   metoCLOPramide  (REGLAN ) injection 10 mg, 10 mg, Intravenous, Q6H PRN, Laurita Manor T, MD   OLANZapine  (ZYPREXA ) tablet 10 mg, 10 mg, Oral, QHS, Laurita Manor T, MD, 10 mg at 08/03/24 2219   QUEtiapine  (SEROQUEL ) tablet 100 mg, 100 mg, Oral, QHS, Laurita Manor T, MD, 100 mg at 08/03/24 2219   senna-docusate (Senokot-S) tablet 1 tablet, 1 tablet, Oral, QHS PRN, Laurita Manor T, MD   zolpidem  (AMBIEN ) tablet 5 mg, 5 mg, Oral, QHS PRN,MR X 1, Zhang, Ping T, MD [2]  Allergies Allergen Reactions   Bee Venom Anaphylaxis   Gabapentin  Other (See Comments) and Anaphylaxis    Muscle twitching   Haldol [Haloperidol] Anaphylaxis and Other (See Comments)    Per father, dystonic reaction with  muscle contractures- also   Hornet Venom Anaphylaxis and Itching   Peanut-Containing Drug Products Anaphylaxis   Wasp Venom Anaphylaxis   Beeswax Swelling

## 2024-08-04 NOTE — Progress Notes (Signed)
 Pt unable to void and no documented void since admission. Pt unable to recollect last void. Bladder scan at 2145 was 425 mL. This was communicated to provider on-call. Pt still unable to void almost 4 hours later at 0130. Order received for I and O cath and pt agreeable. Cath performed by this RN and observer NT, Sophear Ben. Approx urine drained. Will continue to monitor.

## 2024-08-05 DIAGNOSIS — N179 Acute kidney failure, unspecified: Secondary | ICD-10-CM | POA: Diagnosis present

## 2024-08-05 DIAGNOSIS — R4587 Impulsiveness: Secondary | ICD-10-CM | POA: Diagnosis present

## 2024-08-05 DIAGNOSIS — F4310 Post-traumatic stress disorder, unspecified: Secondary | ICD-10-CM | POA: Diagnosis present

## 2024-08-05 DIAGNOSIS — Z87820 Personal history of traumatic brain injury: Secondary | ICD-10-CM | POA: Diagnosis not present

## 2024-08-05 DIAGNOSIS — F329 Major depressive disorder, single episode, unspecified: Secondary | ICD-10-CM | POA: Diagnosis present

## 2024-08-05 DIAGNOSIS — T450X2A Poisoning by antiallergic and antiemetic drugs, intentional self-harm, initial encounter: Secondary | ICD-10-CM | POA: Diagnosis present

## 2024-08-05 DIAGNOSIS — Z888 Allergy status to other drugs, medicaments and biological substances status: Secondary | ICD-10-CM | POA: Diagnosis not present

## 2024-08-05 DIAGNOSIS — Z639 Problem related to primary support group, unspecified: Secondary | ICD-10-CM | POA: Diagnosis not present

## 2024-08-05 DIAGNOSIS — S069XAS Unspecified intracranial injury with loss of consciousness status unknown, sequela: Secondary | ICD-10-CM | POA: Diagnosis not present

## 2024-08-05 DIAGNOSIS — Z91030 Bee allergy status: Secondary | ICD-10-CM | POA: Diagnosis not present

## 2024-08-05 DIAGNOSIS — T43222A Poisoning by selective serotonin reuptake inhibitors, intentional self-harm, initial encounter: Secondary | ICD-10-CM | POA: Diagnosis present

## 2024-08-05 DIAGNOSIS — Z79899 Other long term (current) drug therapy: Secondary | ICD-10-CM | POA: Diagnosis not present

## 2024-08-05 DIAGNOSIS — F445 Conversion disorder with seizures or convulsions: Secondary | ICD-10-CM | POA: Diagnosis present

## 2024-08-05 DIAGNOSIS — Z560 Unemployment, unspecified: Secondary | ICD-10-CM | POA: Diagnosis not present

## 2024-08-05 DIAGNOSIS — F419 Anxiety disorder, unspecified: Secondary | ICD-10-CM | POA: Diagnosis present

## 2024-08-05 DIAGNOSIS — Z9151 Personal history of suicidal behavior: Secondary | ICD-10-CM | POA: Diagnosis not present

## 2024-08-05 DIAGNOSIS — R339 Retention of urine, unspecified: Secondary | ICD-10-CM | POA: Diagnosis present

## 2024-08-05 DIAGNOSIS — T391X2A Poisoning by 4-Aminophenol derivatives, intentional self-harm, initial encounter: Secondary | ICD-10-CM | POA: Diagnosis present

## 2024-08-05 DIAGNOSIS — T50902A Poisoning by unspecified drugs, medicaments and biological substances, intentional self-harm, initial encounter: Secondary | ICD-10-CM

## 2024-08-05 DIAGNOSIS — W2201XA Walked into wall, initial encounter: Secondary | ICD-10-CM | POA: Diagnosis present

## 2024-08-05 DIAGNOSIS — Z1152 Encounter for screening for COVID-19: Secondary | ICD-10-CM | POA: Diagnosis not present

## 2024-08-05 DIAGNOSIS — T43291A Poisoning by other antidepressants, accidental (unintentional), initial encounter: Secondary | ICD-10-CM | POA: Diagnosis present

## 2024-08-05 DIAGNOSIS — T43292A Poisoning by other antidepressants, intentional self-harm, initial encounter: Secondary | ICD-10-CM | POA: Diagnosis not present

## 2024-08-05 DIAGNOSIS — R569 Unspecified convulsions: Secondary | ICD-10-CM | POA: Diagnosis not present

## 2024-08-05 DIAGNOSIS — F603 Borderline personality disorder: Secondary | ICD-10-CM | POA: Diagnosis present

## 2024-08-05 DIAGNOSIS — Z9152 Personal history of nonsuicidal self-harm: Secondary | ICD-10-CM | POA: Diagnosis not present

## 2024-08-05 LAB — COMPREHENSIVE METABOLIC PANEL WITH GFR
ALT: 26 U/L (ref 0–44)
AST: 18 U/L (ref 15–41)
Albumin: 4.2 g/dL (ref 3.5–5.0)
Alkaline Phosphatase: 89 U/L (ref 38–126)
Anion gap: 10 (ref 5–15)
BUN: 14 mg/dL (ref 6–20)
CO2: 23 mmol/L (ref 22–32)
Calcium: 9.3 mg/dL (ref 8.9–10.3)
Chloride: 108 mmol/L (ref 98–111)
Creatinine, Ser: 1.05 mg/dL (ref 0.61–1.24)
GFR, Estimated: >60 mL/min
Glucose, Bld: 93 mg/dL (ref 70–99)
Potassium: 3.9 mmol/L (ref 3.5–5.1)
Sodium: 140 mmol/L (ref 135–145)
Total Bilirubin: 0.3 mg/dL (ref 0.0–1.2)
Total Protein: 6.7 g/dL (ref 6.5–8.1)

## 2024-08-05 LAB — CBC WITH DIFFERENTIAL/PLATELET
Abs Immature Granulocytes: 0.08 K/uL — ABNORMAL HIGH (ref 0.00–0.07)
Basophils Absolute: 0 K/uL (ref 0.0–0.1)
Basophils Relative: 1 %
Eosinophils Absolute: 0.2 K/uL (ref 0.0–0.5)
Eosinophils Relative: 4 %
HCT: 37.1 % — ABNORMAL LOW (ref 39.0–52.0)
Hemoglobin: 12 g/dL — ABNORMAL LOW (ref 13.0–17.0)
Immature Granulocytes: 1 %
Lymphocytes Relative: 33 %
Lymphs Abs: 2 K/uL (ref 0.7–4.0)
MCH: 24.6 pg — ABNORMAL LOW (ref 26.0–34.0)
MCHC: 32.3 g/dL (ref 30.0–36.0)
MCV: 76 fL — ABNORMAL LOW (ref 80.0–100.0)
Monocytes Absolute: 0.5 K/uL (ref 0.1–1.0)
Monocytes Relative: 9 %
Neutro Abs: 3.2 K/uL (ref 1.7–7.7)
Neutrophils Relative %: 52 %
Platelets: 248 K/uL (ref 150–400)
RBC: 4.88 MIL/uL (ref 4.22–5.81)
RDW: 13.8 % (ref 11.5–15.5)
WBC: 6.1 K/uL (ref 4.0–10.5)
nRBC: 0 % (ref 0.0–0.2)

## 2024-08-05 MED ORDER — TAMSULOSIN HCL 0.4 MG PO CAPS
0.4000 mg | ORAL_CAPSULE | Freq: Every day | ORAL | Status: DC
  Administered 2024-08-05 – 2024-08-10 (×6): 0.4 mg via ORAL
  Filled 2024-08-05 (×6): qty 1

## 2024-08-05 MED ORDER — SODIUM CHLORIDE 0.9 % IV SOLN: INTRAVENOUS | Status: DC

## 2024-08-05 NOTE — Plan of Care (Signed)

## 2024-08-05 NOTE — Progress Notes (Signed)
 " PROGRESS NOTE    Vincent Frank  FMW:[id number] DOB: [birth year] DOA: 08/02/2024 PCP: [name], MD    Brief Narrative:  24 y.o. male with medical history significant of MDD, seizure disorder, presented with suicidal ideation and suicidal attempt with Wellbutrin and Benadryl  overdose.   Patient showed up in ED yesterday evening complaining of  feeling a lot of stress in life and took many pills of Wellbutrin and Benadryl  estimated time of ingestion was around 1700.  Patient was lethargic but oriented x 3.  No other complaints such as pain, no nauseous vomiting no headache or vision changes.  No fall at home.   Assessment & Plan:   Principal Problem:   Suicidal overdose, initial encounter (HCC) Active Problems:   SSRI overdose, intentional self-harm, initial encounter (HCC)   Intentional SSRI (selective serotonin reuptake inhibitor) overdose (HCC)   SSRI overdose Benadryl  overdose Suicidal ideation and suicidal attempt Neurologic exam relatively benign however does have some persistent tremors These appear to worsen when patient attempting to ambulate Plan: For today we will keep patient on the medicine service.  Continue IVC, continue one-to-one sitter, psychiatry will continue to follow as a consultation service.  Will reassess mobility in a.m..  Will continue on IV fluids for now.  If mobility is improved anticipate discharge to inpatient behavioral health on 1/11  AKI Urinary retention Likely secondary to ingestion of anticholinergics Resolved Plan: Continue gentle IV fluids.  No need for bladder scans and patient is voiding spontaneously    Prolonged Qtc Tremors Repeat EKG with improved QTc within reference range.  Patient still having tremors. QTc prolongation resolved Plan: Continue IV fluids   Major depression disorder Psychiatry consult, case discussed with Continue IVC     DVT prophylaxis: SCD Code Status: Full Family Communication: None at  bedside.  Patient removed all family members from contact lists and declines offers to call any family members for update Disposition Plan: Status is: Observation The patient will require care spanning > 2 midnights and should be moved to inpatient because: AKI, persistent tremulousness in the setting of intentional Benadryl  and Wellbutrin overdose   Level of care: Telemetry  Consultants:  Psychiatry  Procedures:  None  Antimicrobials: None   Subjective: Seen and examined.  Remains a bit tremulous.  Blunted affect.  Objective: Vitals:   08/04/24 1531 08/04/24 1930 08/05/24 0518 08/05/24 0730  BP: 121/63 121/66 (!) 117/57 120/71  Pulse: 75 83 70 69  Resp: 16 16 20 20   Temp: 98 F (36.7 C) 97.6 F (36.4 C) 97.6 F (36.4 C) (!) 97.2 F (36.2 C)  TempSrc: Oral  Oral Axillary  SpO2: 98% 99% 98% 98%  Weight:      Height:        Intake/Output Summary (Last 24 hours) at 08/05/2024 1310 Last data filed at 08/05/2024 1009 Gross per 24 hour  Intake 3514.93 ml  Output 650 ml  Net 2864.93 ml   Filed Weights   08/03/24 1546  Weight: 103 kg    Examination:  General exam: Tremulous Respiratory system: Clear.  No work of breathing.  Room air Cardiovascular system: S1-S2, RRR, no murmurs, no pedal edema Gastrointestinal system:, Soft, NT/ND, normal bowel sounds Central nervous system: Alert and oriented. No focal neurological deficits. Extremities: Symmetric 5 x 5 power. Skin: No rashes, lesions or ulcers Psychiatry: Judgement and insight appear normal. Mood & affect blunted.     Data Reviewed: I have personally reviewed following labs and imaging studies  CBC: Recent Labs  Lab 08/02/24 1847 08/04/24 0522 08/05/24 0644  WBC 7.7 7.2 6.1  NEUTROABS 5.6  --  3.2  HGB 13.5 13.3 12.0*  HCT 41.0 41.7 37.1*  MCV 73.7* 76.9* 76.0*  PLT 287 275 248   Basic Metabolic Panel: Recent Labs  Lab 08/02/24 1847 08/03/24 0818 08/04/24 0522 08/05/24 0644  NA 139 139 140  140  K 3.8 4.3 4.2 3.9  CL 101 104 103 108  CO2 22 24 25 23   GLUCOSE 114* 92 100* 93  BUN 15 12 19 14   CREATININE 1.13 1.15 1.37* 1.05  CALCIUM 10.5* 10.0 9.6 9.3  MG 2.0  --  2.2  --   PHOS  --  5.7*  --   --    GFR: Estimated Creatinine Clearance: 144.7 mL/min (by C-G formula based on SCr of 1.05 mg/dL). Liver Function Tests: Recent Labs  Lab 08/02/24 1847 08/03/24 0818 08/05/24 0644  AST 24 21 18   ALT 38 31 26  ALKPHOS 98 94 89  BILITOT 0.4 0.6 0.3  PROT 8.5* 8.0 6.7  ALBUMIN 5.2* 4.9 4.2   No results for input(s): LIPASE, AMYLASE in the last 168 hours. No results for input(s): AMMONIA  in the last 168 hours. Coagulation Profile: No results for input(s): INR, PROTIME in the last 168 hours. Cardiac Enzymes: No results for input(s): CKTOTAL, CKMB, CKMBINDEX, TROPONINI in the last 168 hours. BNP (last 3 results) No results for input(s): PROBNP in the last 8760 hours. HbA1C: No results for input(s): HGBA1C in the last 72 hours. CBG: No results for input(s): GLUCAP in the last 168 hours. Lipid Profile: No results for input(s): CHOL, HDL, LDLCALC, TRIG, CHOLHDL, LDLDIRECT in the last 72 hours. Thyroid Function Tests: Recent Labs    08/03/24 0818  TSH 2.710   Anemia Panel: No results for input(s): VITAMINB12, FOLATE, FERRITIN, TIBC, IRON, RETICCTPCT in the last 72 hours. Sepsis Labs: No results for input(s): PROCALCITON, LATICACIDVEN in the last 168 hours.  No results found for this or any previous visit (from the past 240 hours).       Radiology Studies: No results found.      Scheduled Meds:  enoxaparin  (LOVENOX ) injection  40 mg Subcutaneous Q24H   feeding supplement  237 mL Oral BID BM   FLUoxetine   20 mg Oral Daily   OLANZapine   10 mg Oral QHS   QUEtiapine   100 mg Oral QHS   Continuous Infusions:  sodium chloride  100 mL/hr at 08/05/24 1213     LOS: 0 days     Vincent KATHEE Robson,  MD [facility]   If 7PM-7AM, please contact night-coverage  08/05/2024, 1:10 PM   "

## 2024-08-05 NOTE — Progress Notes (Signed)
 Patient last time voided at 7:00 am. Patient is unable to void. Denies urge to void as well. Bladder scan showed 515 mls. Patient stood at bedside trying to pee but could not. MD made aware. New orders to In&out cath.   Liliah Dorian G Patino Hernandez, RN

## 2024-08-05 NOTE — Progress Notes (Signed)
 Assisted patient with walking. Patient verbalized feeling like jello. Patient sis not appear steady on his feet. He had to hold on to the sink and ended up using the walker for better balance. He complains of feeling very weak and dizzy. Bodily  tremors appeared to have worsen when pt stood up. MD Sreenath  made aware.

## 2024-08-06 ENCOUNTER — Inpatient Hospital Stay

## 2024-08-06 DIAGNOSIS — T50902A Poisoning by unspecified drugs, medicaments and biological substances, intentional self-harm, initial encounter: Secondary | ICD-10-CM | POA: Diagnosis not present

## 2024-08-06 LAB — COMPREHENSIVE METABOLIC PANEL WITH GFR
ALT: 35 U/L (ref 0–44)
AST: 25 U/L (ref 15–41)
Albumin: 4.5 g/dL (ref 3.5–5.0)
Alkaline Phosphatase: 99 U/L (ref 38–126)
Anion gap: 12 (ref 5–15)
BUN: 10 mg/dL (ref 6–20)
CO2: 24 mmol/L (ref 22–32)
Calcium: 9.8 mg/dL (ref 8.9–10.3)
Chloride: 104 mmol/L (ref 98–111)
Creatinine, Ser: 1.05 mg/dL (ref 0.61–1.24)
GFR, Estimated: >60 mL/min
Glucose, Bld: 92 mg/dL (ref 70–99)
Potassium: 4.5 mmol/L (ref 3.5–5.1)
Sodium: 139 mmol/L (ref 135–145)
Total Bilirubin: <0.2 mg/dL (ref 0.0–1.2)
Total Protein: 7.1 g/dL (ref 6.5–8.1)

## 2024-08-06 LAB — PHOSPHORUS: Phosphorus: 2.8 mg/dL (ref 2.5–4.6)

## 2024-08-06 LAB — CBC WITH DIFFERENTIAL/PLATELET
Abs Immature Granulocytes: 0.03 K/uL (ref 0.00–0.07)
Basophils Absolute: 0 K/uL (ref 0.0–0.1)
Basophils Relative: 0 %
Eosinophils Absolute: 0.2 K/uL (ref 0.0–0.5)
Eosinophils Relative: 3 %
HCT: 39 % (ref 39.0–52.0)
Hemoglobin: 12.6 g/dL — ABNORMAL LOW (ref 13.0–17.0)
Immature Granulocytes: 0 %
Lymphocytes Relative: 24 %
Lymphs Abs: 1.8 K/uL (ref 0.7–4.0)
MCH: 24.8 pg — ABNORMAL LOW (ref 26.0–34.0)
MCHC: 32.3 g/dL (ref 30.0–36.0)
MCV: 76.8 fL — ABNORMAL LOW (ref 80.0–100.0)
Monocytes Absolute: 0.6 K/uL (ref 0.1–1.0)
Monocytes Relative: 8 %
Neutro Abs: 4.6 K/uL (ref 1.7–7.7)
Neutrophils Relative %: 65 %
Platelets: 274 K/uL (ref 150–400)
RBC: 5.08 MIL/uL (ref 4.22–5.81)
RDW: 13.3 % (ref 11.5–15.5)
WBC: 7.2 K/uL (ref 4.0–10.5)
nRBC: 0 % (ref 0.0–0.2)

## 2024-08-06 LAB — CK: Total CK: 57 U/L (ref 49–397)

## 2024-08-06 LAB — AMMONIA: Ammonia: 22 umol/L (ref 9–35)

## 2024-08-06 LAB — MAGNESIUM: Magnesium: 1.8 mg/dL (ref 1.7–2.4)

## 2024-08-06 MED ORDER — SODIUM CHLORIDE 0.9 % IV SOLN: INTRAVENOUS | Status: DC

## 2024-08-06 NOTE — Progress Notes (Signed)
 Brief note.  Full note to follow.  Patient remains on medical service for persistent tremors, urinary retention and inability to perform all ADLs.  He appears to be clinically improving.  He was able to void though with some extra effort.  He has not required any further In-N-Out catheterizations.  RN to ambulate patient today and assess ability to perform ADLs and ability to void spontaneously.  If his functional status has returned to baseline he will be medically ready for discharge and transferred to inpatient behavioral health services.  Psychiatry team made aware.  Calvin Robson MD No charge

## 2024-08-06 NOTE — Progress Notes (Signed)
 Received call from patients sitter that after patient was eating dinner, he started to twitch and complaining of nausea with some vision changes (narrowing around the edges). BP 131/99. Reglan  given for nausea. Dr. Jhonny notified and orders placed for lab work and a head CT. Will continue to monitor.

## 2024-08-06 NOTE — Progress Notes (Signed)
 Patient ambulated to the bathroom with stand by assistance. Patient noted to be furniture hopping to steady himself. Patient declined walker for stabilization. Patient acted like his legs were hard to pick up and scooted them to/from bathroom. At baseline, patient has moderate tremors in his hands. However, with ambulation his tremors became worse and were noted in his lower extremities as well.   Patient able to void on the toilet within 2 minutes of attempt. RN checked post void residual bladder scan which showed 116. Patient encouraged to continue to ambulate to help get strength back. Patient stated understanding, noting he is just extremely fatigued from not sleeping well at night.

## 2024-08-06 NOTE — Progress Notes (Signed)
 Patient passed urine in the toilet with no difficulties.Bladder scan post void showed 0 ml.Assisted patient back to bed ,pt is a standby assist with walker due to tremors.

## 2024-08-06 NOTE — Progress Notes (Signed)
 " PROGRESS NOTE    Vincent Frank  FMW:[id number] DOB: [birth year] DOA: 08/02/2024 PCP: [name], MD    Brief Narrative:  24 y.o. male with medical history significant of MDD, seizure disorder, presented with suicidal ideation and suicidal attempt with Wellbutrin and Benadryl  overdose.   Patient showed up in ED yesterday evening complaining of  feeling a lot of stress in life and took many pills of Wellbutrin and Benadryl  estimated time of ingestion was around 1700.  Patient was lethargic but oriented x 3.  No other complaints such as pain, no nauseous vomiting no headache or vision changes.  No fall at home.   Assessment & Plan:   Principal Problem:   Suicidal overdose, initial encounter (HCC) Active Problems:   SSRI overdose, intentional self-harm, initial encounter (HCC)   Intentional SSRI (selective serotonin reuptake inhibitor) overdose (HCC)   SSRI overdose Benadryl  overdose Suicidal ideation and suicidal attempt Neurologic exam relatively benign however does have some persistent tremors These appear to worsen when patient attempting to ambulate this has been persistent over the last 48 hours Plan: Continue to maintain patient on the medicine service.  Continue IVC.  Continue one-to-one sitter.  Psychiatry will continue to follow this consultation service.  Will reassess mobility in a.m. and appropriateness for transfer to inpatient psychiatry service  AKI Urinary retention Likely secondary to ingestion of anticholinergics Resolved Plan: DC IV fluids Encourage ambulation    Prolonged Qtc Tremors Repeat EKG with improved QTc within reference range.  Patient still having tremors. QTc prolongation resolved Plan: DC IV fluids   Major depression disorder Psychiatry consulted Continue IVC Plan to admit to inpatient behavioral health services when medically stable     DVT prophylaxis: SCD Code Status: Full Family Communication: None at bedside.   Patient removed all family members from contact lists and declines offers to call any family members for update Disposition Plan: Status is: Inpatient Remains inpatient appropriate because: Continued tremors and poor level of mobility.  Needs to be independent for transfer to inpatient behavioral health services.  Anticipate medical readiness 1/12.     Level of care: Telemetry  Consultants:  Psychiatry  Procedures:  None  Antimicrobials: None   Subjective: Seen and examined.  Remains tremulous.  Reports lethargy from poor sleep.   Blunted affect  Objective: Vitals:   08/05/24 1522 08/05/24 2121 08/06/24 0500 08/06/24 0932  BP: (!) 123/56 122/71 120/68 126/69  Pulse: 79 87 70 74  Resp: 18 19 18 16   Temp: 98.2 F (36.8 C) 97.8 F (36.6 C) 98.1 F (36.7 C) 97.8 F (36.6 C)  TempSrc: Oral Oral Oral Oral  SpO2: 98% 98% 98% 97%  Weight:      Height:        Intake/Output Summary (Last 24 hours) at 08/06/2024 1412 Last data filed at 08/06/2024 1300 Gross per 24 hour  Intake 2240.15 ml  Output --  Net 2240.15 ml   Filed Weights   08/03/24 1546  Weight: 103 kg    Examination:  General exam: Pleasant affect, mildly tremulous Respiratory system: Clear.  No work of breathing.  Room air Cardiovascular system: S1-S2, RRR, no murmurs, no pedal edema Gastrointestinal system:, Soft, NT/ND, normal bowel sounds Central nervous system: Alert and oriented. No focal neurological deficits. Extremities: Symmetric 5 x 5 power. Skin: No rashes, lesions or ulcers Psychiatry: Judgement and insight appear normal. Mood & affect blunted.     Data Reviewed: I have personally reviewed following labs and imaging studies  CBC: Recent  Labs  Lab 08/02/24 1847 08/04/24 0522 08/05/24 0644  WBC 7.7 7.2 6.1  NEUTROABS 5.6  --  3.2  HGB 13.5 13.3 12.0*  HCT 41.0 41.7 37.1*  MCV 73.7* 76.9* 76.0*  PLT 287 275 248   Basic Metabolic Panel: Recent Labs  Lab 08/02/24 1847  08/03/24 0818 08/04/24 0522 08/05/24 0644  NA 139 139 140 140  K 3.8 4.3 4.2 3.9  CL 101 104 103 108  CO2 22 24 25 23   GLUCOSE 114* 92 100* 93  BUN 15 12 19 14   CREATININE 1.13 1.15 1.37* 1.05  CALCIUM 10.5* 10.0 9.6 9.3  MG 2.0  --  2.2  --   PHOS  --  5.7*  --   --    GFR: Estimated Creatinine Clearance: 144.7 mL/min (by C-G formula based on SCr of 1.05 mg/dL). Liver Function Tests: Recent Labs  Lab 08/02/24 1847 08/03/24 0818 08/05/24 0644  AST 24 21 18   ALT 38 31 26  ALKPHOS 98 94 89  BILITOT 0.4 0.6 0.3  PROT 8.5* 8.0 6.7  ALBUMIN 5.2* 4.9 4.2   No results for input(s): LIPASE, AMYLASE in the last 168 hours. No results for input(s): AMMONIA  in the last 168 hours. Coagulation Profile: No results for input(s): INR, PROTIME in the last 168 hours. Cardiac Enzymes: No results for input(s): CKTOTAL, CKMB, CKMBINDEX, TROPONINI in the last 168 hours. BNP (last 3 results) No results for input(s): PROBNP in the last 8760 hours. HbA1C: No results for input(s): HGBA1C in the last 72 hours. CBG: No results for input(s): GLUCAP in the last 168 hours. Lipid Profile: No results for input(s): CHOL, HDL, LDLCALC, TRIG, CHOLHDL, LDLDIRECT in the last 72 hours. Thyroid Function Tests: No results for input(s): TSH, T4TOTAL, FREET4, T3FREE, THYROIDAB in the last 72 hours.  Anemia Panel: No results for input(s): VITAMINB12, FOLATE, FERRITIN, TIBC, IRON, RETICCTPCT in the last 72 hours. Sepsis Labs: No results for input(s): PROCALCITON, LATICACIDVEN in the last 168 hours.  No results found for this or any previous visit (from the past 240 hours).       Radiology Studies: No results found.      Scheduled Meds:  enoxaparin  (LOVENOX ) injection  40 mg Subcutaneous Q24H   feeding supplement  237 mL Oral BID BM   FLUoxetine   20 mg Oral Daily   OLANZapine   10 mg Oral QHS   QUEtiapine   100 mg Oral QHS    tamsulosin   0.4 mg Oral Daily   Continuous Infusions:  sodium chloride  100 mL/hr at 08/06/24 0930     LOS: 1 day     [name], MD [facility]   If 7PM-7AM, please contact night-coverage  08/06/2024, 2:12 PM   "

## 2024-08-06 NOTE — Plan of Care (Signed)

## 2024-08-06 NOTE — Consult Note (Signed)
 [place] [facility] Consult Follow Up  Patient Name: [name]  MRN: [id number]  DOB: 2001-03-19  Consult Order details:  Orders (From admission, onward)     Start     Ordered   08/02/24 1841  CONSULT TO CALL ACT TEAM       Ordering Provider: Jacolyn Pae, MD  Provider:  (Not yet assigned)  Question:  Reason for Consult?  Answer:  Psych consult   08/02/24 1841   08/02/24 1841  IP CONSULT TO PSYCHIATRY       Ordering Provider: Jacolyn Pae, MD  Provider:  (Not yet assigned)  Question Answer Comment  Reason for consult: Other (see comments)   Comments: Intentional overdose      08/02/24 1841             Mode of Visit: In person    Psychiatry Consult Evaluation  Service Date: August 06, 2024 LOS:  LOS: 1 day  Chief Complaint Intentional overdose  Primary Psychiatric Diagnoses  Suicidal overdose   Assessment   08/06/2024: Patient was seen today during psychiatric consultation rounds. Patient was alert and oriented X 3, calm, cooperative, and engaged in the interview. Patient was drowsy and lethargic during the conversation. He reports that he feels tired and weak and just prefers to sleep. Per clinical staff, patient has had trials of ambulating to assess his ability to walk and maintain his balance. Unfortunately, patient has not had the ability to walk and balance himself independently. Patient remains extremely tremulous with movement per the clinical medical team. Patient reports his appetite is gradually returning. He continues to endorse feeling depressed, but he denies suicidal ideations. Patient reports having some anxiety as well which he rates both 2 out of 10. Patient is observed minimizing his symptoms today. Patient also denies homicidal ideations and perceptual disturbances. Patient remains on 1:1 observation per protocol and has a sister at bedside. Will continue to provide consultation until he transitions to the inpatient unit.    Patient is recommended for inpatient psychiatric admission following medical clearance based on significant clinical concerns. Patient presents with active and repeated suicide attempts, including the current presentation for intentional overdose on Benadryl  and possibly Wellbutrin, occurring shortly after a recent intentional Tylenol  overdose approximately one week ago on July 27, 2024. This pattern of repeated suicide attempts within a short timeframe represents imminent and ongoing danger to self requiring intensive psychiatric intervention in a secure, monitored environment.  Patient has extensive psychiatric history including major depressive disorder, PTSD, likely borderline personality disorder, psychogenic nonepileptic seizures, and documented history of multiple suicide attempts over time. Patient's mental health conditions are currently untreated, as he has no established psychiatrist or medication management provider. Patient has only attended one therapy session at [facility], indicating he is in the very early stages of establishing outpatient mental health support and does not yet have adequate therapeutic relationship or coping skills developed to maintain safety in the community.  Patient identifies family conflict as a major trigger for his suicidal behavior and describes living in a chronically stressful home environment with mother, father, and sister, with whom he reports strained relationships and feelings of being less valued than his sister. Patient's living situation appears to be a significant ongoing stressor that contributes to his suicidal ideation and behavior. Patient explicitly does not want family involved in his care at this time.  Patient has limited treatment response history, having tried multiple medication trials (Prozac , Abilify  which worsened suicidal thoughts) and ECT treatment  approximately 2 years ago that provided only temporary relief for a  couple of months before depression returned. 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  Patient is currently unemployed due to mental health factors, which may contribute to feelings of worthlessness, hopelessness, and lack of purpose that can worsen depression and increase suicide risk. Patient has minimal outpatient support system established and no current psychiatric medication management, leaving him without adequate safety net or treatment resources in the community.  Inpatient psychiatric admission will provide safe, secure environment for suicide prevention and intensive monitoring during this high-risk period with pattern of repeated attempts, comprehensive psychiatric evaluation and diagnostic clarification, including assessment for borderline personality disorder given chart documentation and pattern of repeated suicide attempts in context of interpersonal stressors, medication initiation and optimization for treatment-resistant depression, including consideration of medication combinations or novel approaches, establishment of intensive individual therapy to address underlying depression, PTSD, family conflict, and maladaptive coping patterns, dialectical behavior therapy (DBT) or other evidence-based interventions if borderline personality disorder is confirmed, to address emotion dysregulation and self-harm behaviors.  The recommendation for inpatient psychiatric admission following medical clearance was discussed with both patient and the primary medical team. Patient appeared to understand the recommendation and rationale for this level of care. Patient will remain medically hospitalized until cleared by the primary medical team, at which time he will be transferred to an inpatient psychiatric facility for continued evaluation, stabilization, and comprehensive  treatment of his treatment-resistant depression, repeated suicide attempts, and complex psychiatric needs.     Diagnoses:  Active Hospital problems: Principal Problem:   Suicidal overdose, initial encounter [facility]) Active Problems:   SSRI overdose, intentional self-harm, initial encounter (HCC)   Intentional SSRI (selective serotonin reuptake inhibitor) overdose (HCC)    Plan   ## Psychiatric Medication Recommendations:  Will defer further management to inpatient psychiatric unit  ## Medical Decision Making Capacity: Not specifically addressed in this encounter  ## Further Work-up:   -- most recent EKG on 08/03/2024 had QtC of 457 -- Pertinent labwork reviewed earlier this admission includes: BMP, CBC, TSH, CMP, acetaminophen  level, ethanol, salicylate level, CBC   ## Disposition:-- Patient is under IVC and recommended for inpatient psychiatric admission after medical stabilization  ## Behavioral / Environmental: - No specific recommendations at this time.     ## Safety and Observation Level:  - Based on my clinical evaluation, I estimate the patient to be at moderate risk of self harm in the current setting. - At this time, we recommend  1:1 Observation. This decision is based on my review of the chart including patient's history and current presentation, interview of the patient, mental status examination, and consideration of suicide risk including evaluating suicidal ideation, plan, intent, suicidal or self-harm behaviors, risk factors, and protective factors. This judgment is based on our ability to directly address suicide risk, implement suicide prevention strategies, and develop a safety plan while the patient is in the clinical setting. Please contact our team if there is a concern that risk level has changed.  CSSR Risk Category:C-SSRS RISK CATEGORY: High Risk  Suicide Risk Assessment: Patient has following modifiable risk factors for suicide: lack of access to outpatient  mental health resources, current symptoms: anxiety/panic, insomnia, impulsivity, anhedonia, hopelessness, triggering events, and recent psychiatric hospitalization, which we are addressing by recommending inpatient psychiatric admission for further monitoring and stabilization. Patient has following non-modifiable or demographic risk factors for suicide: male gender, history of suicide attempt, and  psychiatric hospitalization Patient has the following protective factors against suicide: Access to outpatient mental health care  Thank you for this consult request. Recommendations have been communicated to the primary team.  We will continue to round while medically admitted at this time.   Camelia Mountain, NP        History of Present Illness  Relevant Aspects of [facility]   Patient Report:  Patient was assessed by psychiatry today. Patient endorsed intentionally overdosing on medications in an attempt to harm himself due to family conflict. Patient reported he knows he intentionally took Benadryl  and stated he may have also taken Wellbutrin along with this, but reported he was honestly unsure about the Wellbutrin. Of note, chart review reveals patient has a recent intentional overdose on Tylenol  documented around July 27, 2024 of this year. Patient endorsed multiple suicide attempts in the past.  Patient reported that family is a major trigger for him and stated they know how to trigger me. Patient expressed he does not want family involved in his care currently. Patient reported he lives at home with his mother, father, and sister. He referred to his sister as the golden child and endorsed that relationships with all of his family members are strained, suggesting feelings of being less favored or valued within the family dynamic.  Patient reported he currently sees a therapist through [facility] but has only attended 1 session thus far. However, patient stated he  does not see a psychiatrist or medication management provider at this time. Patient reported his Wellbutrin prescription is from a long time ago and that PCPs have just continued the medication. Patient reported in the past he has trialed Prozac  and Abilify , with Abilify  reportedly worsening his suicidal thoughts. Patient also reported completing 12 sessions of electroconvulsive therapy (ECT) approximately 2 years ago, which he stated helped for a couple of months, but his depression returned after that period.  Patient endorsed previous mental health history of major depressive disorder and post-traumatic stress disorder. Through chart review, there was mention of potential borderline personality disorder. However, patient did not list this diagnosis during today's assessment. Patient endorsed history of seizures but reported his last known seizure was a couple of years ago. Chart review of recent admission notes documented through [facility] lists patient's history as including borderline personality disorder, psychogenic nonepileptic seizures, major depressive disorder, PTSD, and history of multiple suicide attempts.  Patient endorsed rare alcohol use. He denied any other recreational drug use or tobacco use. Patient reported previous inpatient psychiatric admissions, with the most recent being what he described as last week when he intentionally overdosed on Tylenol . Patient denied any access to firearms or weapons and denied any upcoming known legal charges. Patient reported he is not currently employed, as he had to leave the workforce due to many factors including his mental health struggles.  Psych ROS:  Depression: Endorsed Anxiety: Endorsed Mania (lifetime and current): Denied Psychosis: (lifetime and current): Denied  Collateral information:  Patient declined to provide contact for collateral information at this time    Psychiatric and Social History   Psychiatric History:  Information collected from patient/chart review  Prev Dx/Sx: MDD, borderline personality disorder, PTSD, multiple previous suicide attempts Current Psych Provider: Denied Home Meds (current): Wellbutrin Previous Med Trials: Abilify , Prozac  Therapy: Yes  Prior Psych Hospitalization: Yes Prior Self Harm: Yes Prior Violence: Denied  Family Psych History: Reported brother has history of bipolar disorder Family Hx suicide: Denied  Social History:  Educational Hx: Some college Occupational Hx: 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 Legal Hx: Denied Living Situation: With mother, father and sister  Access to weapons/lethal means: Denied access to firearms  Substance History Alcohol: Endorsed occasional use History of alcohol withdrawal seizures denied History of DT's denied Tobacco: Denied Illicit drugs: Denied Prescription drug abuse: Denied Rehab hx: Denied  Exam Findings  Physical Exam: Reviewed and agree with the physical exam findings conducted by the medical provider Vital Signs:  Temp:  [97.8 F (36.6 C)-98.1 F (36.7 C)] 97.8 F (36.6 C) (01/11 1744) Pulse Rate:  [70-87] 86 (01/11 1744) Resp:  [16-19] 18 (01/11 1744) BP: (118-126)/(68-79) 118/79 (01/11 1744) SpO2:  [96 %-98 %] 96 % (01/11 1744) Blood pressure 118/79, pulse 86, temperature 97.8 F (36.6 C), temperature source Oral, resp. rate 18, height 6' 3 (1.905 m), weight 103 kg, SpO2 96%. Body mass index is 28.37 kg/m.    Mental Status Exam: General Appearance: Fairly Groomed  Orientation:  Full (Time, Place, and Person)  Memory:  Fair  Concentration:  Concentration: Fair  Recall:  Fair  Attention  Fair  Eye Contact:  Minimal  Speech:  Clear and Coherent  Language:  Fair  Volume:  Normal  Mood: Depressed  Affect:  Depressed  Thought Process:  Coherent and Linear  Thought Content:  Logical  Suicidal Thoughts:  Yes.  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  Family History:  The patient's family history includes Healthy in his mother.  Medical History: Past Medical History:  Diagnosis Date   Anxiety    Depression    Major depressive disorder    Seizures (HCC)     Surgical History: Past Surgical History:  Procedure Laterality Date   NO PAST SURGERIES       Medications:  Current Medications[1]  Allergies: Allergies[2]  Camelia Mountain, NP       [1]  Current Facility-Administered Medications:    acetaminophen  (TYLENOL ) tablet 650 mg, 650 mg, Oral, Q6H PRN **OR** acetaminophen  (TYLENOL ) suppository 650 mg, 650 mg, Rectal, Q6H PRN, Laurita Manor T, MD   enoxaparin  (LOVENOX ) injection 40 mg, 40 mg, Subcutaneous, Q24H, Zhang, Ping T, MD, 40 mg at 08/05/24 2138   feeding supplement (ENSURE PLUS HIGH PROTEIN) liquid 237 mL, 237 mL, Oral, BID BM, Laurita, Ping T, MD, 237 mL at 08/06/24 1304   FLUoxetine  (PROZAC ) capsule 20 mg, 20 mg, Oral, Daily, Siadecki, Sebastian, MD, 20 mg at 08/06/24 0932   hydrOXYzine  (ATARAX ) tablet 25 mg, 25 mg, Oral, Q8H PRN, Zhang, Ping T, MD   metoCLOPramide  (REGLAN ) injection 10 mg, 10 mg, Intravenous, Q6H PRN, Zhang, Ping T, MD   OLANZapine  (ZYPREXA ) tablet 10 mg, 10 mg, Oral, QHS, Laurita Manor T, MD, 10 mg at 08/05/24 2138   QUEtiapine  (SEROQUEL ) tablet 100 mg, 100 mg, Oral, QHS, Zhang, Ping T, MD, 100 mg at 08/05/24 2138   senna-docusate (Senokot-S) tablet 1 tablet, 1 tablet, Oral, QHS PRN, Laurita Manor T, MD   tamsulosin  (FLOMAX ) capsule 0.4 mg, 0.4  mg, Oral, Daily, Sreenath, Sudheer B, MD, 0.4  mg at 08/06/24 1703   zolpidem  (AMBIEN ) tablet 5 mg, 5 mg, Oral, QHS PRN,MR X 1, Zhang, Ping T, MD [2]  Allergies Allergen Reactions   Bee Venom Anaphylaxis   Gabapentin  Other (See Comments) and Anaphylaxis    Muscle twitching   Haldol [Haloperidol] Anaphylaxis and Other (See Comments)    Per father, dystonic reaction with  muscle contractures- also   Hornet Venom Anaphylaxis and Itching   Peanut-Containing Drug Products Anaphylaxis   Wasp Venom Anaphylaxis   Beeswax Swelling

## 2024-08-07 DIAGNOSIS — T50902A Poisoning by unspecified drugs, medicaments and biological substances, intentional self-harm, initial encounter: Secondary | ICD-10-CM | POA: Diagnosis not present

## 2024-08-07 LAB — RESP PANEL BY RT-PCR (RSV, FLU A&B, COVID)  RVPGX2
Influenza A by PCR: NEGATIVE
Influenza B by PCR: NEGATIVE
Resp Syncytial Virus by PCR: NEGATIVE
SARS Coronavirus 2 by RT PCR: NEGATIVE

## 2024-08-07 NOTE — Plan of Care (Signed)

## 2024-08-07 NOTE — TOC Progression Note (Signed)
 Transition of Care [facility]) - Progression Note    Patient Details  Name: Vincent Frank MRN: [id number] Date of Birth: [birth year]  [facility] CM/SW Contact  Corean ONEIDA Haddock, RN Phone Number: 08/07/2024, 3:16 PM  Clinical Narrative:     [facility] is able to accept patient.  Darina at [facility] request IVC paperwork and covid test be faxed  Per ED secretary sheriff office will not be able to transport until tomorrow.  Darina at [facility] notified  [facility] Accepting MD Prentice Balloon DO # to call report is [phone] - IVC paper work, and covid test have already been faxed to [phone]                    Expected Discharge Plan and Services         Expected Discharge Date: 08/07/24                                     Social Drivers of Health (SDOH) Interventions SDOH Screenings   Food Insecurity: No Food Insecurity (08/03/2024)  Recent Concern: Food Insecurity - Food Insecurity Present (07/28/2024)   Received from [facility]  Housing: Unknown (08/03/2024)  Recent Concern: Housing - High Risk (07/28/2024)   Received from [facility]  Transportation Needs: No Transportation Needs (08/03/2024)  Recent Concern: Transportation Needs - Unmet Transportation Needs (07/13/2024)   Received from [facility]  Utilities: Not At Risk (08/03/2024)  Recent Concern: Utilities - High Risk (07/13/2024)   Received from [facility]  Alcohol Screen: Low Risk (06/03/2023)  Financial Resource Strain: Medium Risk (07/13/2024)   Received from [facility]  Physical Activity: Sufficiently Active (07/13/2024)   Received from [facility]  Social Connections: Patient Declined (08/03/2024)  Recent Concern: Social Connections - Moderately Isolated (07/13/2024)   Received from [facility]  Stress: Stress Concern Present (07/28/2024)   Received from [facility]  Tobacco Use: Low Risk (08/02/2024)  Health Literacy: Low  Risk (07/13/2024)   Received from [facility]    Readmission Risk Interventions     No data to display

## 2024-08-07 NOTE — Significant Event (Signed)
 I was informed by the inpatient behavioral health unit house supervisor that after the patient was deemed medically stable and discharge orders were in place that the inpatient to behavioral health unit at [facility] would not be accepting this patient.  Per the behavioral unit [facility], they do not review patients for appropriateness of admission until they are medically stable.  Case has been escalated to hospital leadership  Calvin Robson MD  No charge

## 2024-08-07 NOTE — Discharge Summary (Signed)
 Physician Discharge Summary  Vincent Frank FMW:[id number] DOB: [birth year] DOA: 08/02/2024  PCP: [name], MD  Admit date: 08/02/2024 Discharge date: 08/07/2024  Admitted From: Home Disposition:  Inpatient psychiatric hospitalization  Recommendations for Outpatient Follow-up:  NA   Home Health:No  Equipment/Devices:None   Discharge Condition:Stable  CODE STATUS:FULL  Diet recommendation: Reg  Brief/Interim Summary:  24 y.o. male with medical history significant of MDD, seizure disorder, presented with suicidal ideation and suicidal attempt with Wellbutrin and Benadryl  overdose.   Patient showed up in ED yesterday evening complaining of  feeling a lot of stress in life and took many pills of Wellbutrin and Benadryl  estimated time of ingestion was around 1700.  Patient was lethargic but oriented x 3.  No other complaints such as pain, no nauseous vomiting no headache or vision changes.  No fall at home.      Discharge Diagnoses:  Principal Problem:   Suicidal overdose, initial encounter (HCC) Active Problems:   SSRI overdose, intentional self-harm, initial encounter (HCC)   Intentional SSRI (selective serotonin reuptake inhibitor) overdose (HCC)  SSRI overdose Benadryl  overdose Suicidal ideation and suicidal attempt Neurologic exam relatively benign however does have some persistent tremors These appear to worsen when patient attempting to ambulate this has been persistent over the last 48 hours Plan: Patient is medically stable and appropriate for transfer to inpatient psychiatric service    AKI Urinary retention Likely secondary to ingestion of anticholinergics Resolved Plan: Resolved Encourage ambulation efforts     Prolonged Qtc Tremors Repeat EKG with improved QTc within reference range.  Patient still having tremors. QTc prolongation resolved Plan: Resolved  Major depression disorder Psychiatry consulted Admit to inpatient psych      Discharge Instructions  Discharge Instructions     Increase activity slowly   Complete by: As directed       Allergies as of 08/07/2024       Reactions   Bee Venom Anaphylaxis   Gabapentin  Other (See Comments), Anaphylaxis   Muscle twitching   Haldol [haloperidol] Anaphylaxis, Other (See Comments)   Per father, dystonic reaction with  muscle contractures- also   Hornet Venom Anaphylaxis, Itching   Peanut-containing Drug Products Anaphylaxis   Wasp Venom Anaphylaxis   Beeswax Swelling        Medication List     STOP taking these medications    ARIPiprazole  10 MG tablet Commonly known as: ABILIFY    ARIPiprazole  15 MG tablet Commonly known as: ABILIFY    ARIPiprazole  5 MG tablet Commonly known as: ABILIFY    hydrOXYzine  25 MG tablet Commonly known as: ATARAX    lamoTRIgine  25 MG tablet Commonly known as: LAMICTAL    lamoTRIgine  50 MG 24 hour tablet Commonly known as: LAMICTAL  XR   naproxen  500 MG tablet Commonly known as: NAPROSYN    oxybutynin  5 MG tablet Commonly known as: DITROPAN    oxyCODONE -acetaminophen  5-325 MG tablet Commonly known as: PERCOCET/ROXICET   traZODone  50 MG tablet Commonly known as: DESYREL        TAKE these medications    FLUoxetine  20 MG capsule Commonly known as: PROZAC  Take 3 capsules (60 mg total) by mouth daily for 14 days. What changed: Another medication with the same name was removed. Continue taking this medication, and follow the directions you see here.   hydrOXYzine  25 MG capsule Commonly known as: VISTARIL  Take 25 mg by mouth every 8 (eight) hours as needed.   OLANZapine  5 MG tablet Commonly known as: ZYPREXA  Take 10 mg by mouth at bedtime.   QUEtiapine  100  MG tablet Commonly known as: SEROQUEL  Take 100 mg by mouth at bedtime.        Allergies[1]  Consultations: Psychiatry    Procedures/Studies: CT HEAD WO CONTRAST ( ) Result Date: 08/06/2024 EXAM: CT HEAD WITHOUT CONTRAST 08/06/2024  09:36:35 PM TECHNIQUE: CT of the head was performed without the administration of intravenous contrast. Automated exposure control, iterative reconstruction, and/or weight based adjustment of the mA/kV was utilized to reduce the radiation dose to as low as reasonably achievable. COMPARISON: 05/31/2024 CLINICAL HISTORY: Mental status change, unknown cause. FINDINGS: BRAIN AND VENTRICLES: No acute hemorrhage. No evidence of acute infarct. No hydrocephalus. No extra-axial collection. No mass effect or midline shift. ORBITS: No acute abnormality. SINUSES: No acute abnormality. SOFT TISSUES AND SKULL: No acute soft tissue abnormality. No skull fracture. IMPRESSION: 1. No acute intracranial abnormality. Electronically signed by: [name] MD 08/06/2024 09:53 PM EST RP Workstation: [id number]      Subjective: Seen and examined on day of discharge Medically stable Appropriate for transfer to inpatient psych  Discharge Exam: Vitals:   08/07/24 0617 08/07/24 0939  BP: 130/64 123/70  Pulse: 77 88  Resp: 17 18  Temp: 97.9 F (36.6 C) 98.6 F (37 C)  SpO2: 97% 98%   Vitals:   08/06/24 1850 08/06/24 1927 08/07/24 0617 08/07/24 0939  BP: 105/86 121/63 130/64 123/70  Pulse: 92 91 77 88  Resp:  18 17 18   Temp:  97.9 F (36.6 C) 97.9 F (36.6 C) 98.6 F (37 C)  TempSrc:  Oral Oral   SpO2:  97% 97% 98%  Weight:      Height:        General: Pt is alert, awake, not in acute distress Cardiovascular: RRR, S1/S2 +, no rubs, no gallops Respiratory: CTA bilaterally, no wheezing, no rhonchi Abdominal: Soft, NT, ND, bowel sounds + Extremities: no edema, no cyanosis    The results of significant diagnostics from this hospitalization (including imaging, microbiology, ancillary and laboratory) are listed below for reference.     Microbiology: No results found for this or any previous visit (from the past 240 hours).   Labs: BNP (last 3 results) No results for input(s): BNP in the last  8760 hours. Basic Metabolic Panel: Recent Labs  Lab 08/02/24 1847 08/03/24 0818 08/04/24 0522 08/05/24 0644 08/06/24 1907  NA 139 139 140 140 139  K 3.8 4.3 4.2 3.9 4.5  CL 101 104 103 108 104  CO2 22 24 25 23 24   GLUCOSE 114* 92 100* 93 92  BUN 15 12 19 14 10   CREATININE 1.13 1.15 1.37* 1.05 1.05  CALCIUM 10.5* 10.0 9.6 9.3 9.8  MG 2.0  --  2.2  --  1.8  PHOS  --  5.7*  --   --  2.8   Liver Function Tests: Recent Labs  Lab 08/02/24 1847 08/03/24 0818 08/05/24 0644 08/06/24 1907  AST 24 21 18 25   ALT 38 31 26 35  ALKPHOS 98 94 89 99  BILITOT 0.4 0.6 0.3 <0.2  PROT 8.5* 8.0 6.7 7.1  ALBUMIN 5.2* 4.9 4.2 4.5   No results for input(s): LIPASE, AMYLASE in the last 168 hours. Recent Labs  Lab 08/06/24 1907  AMMONIA  22   CBC: Recent Labs  Lab 08/02/24 1847 08/04/24 0522 08/05/24 0644 08/06/24 1907  WBC 7.7 7.2 6.1 7.2  NEUTROABS 5.6  --  3.2 4.6  HGB 13.5 13.3 12.0* 12.6*  HCT 41.0 41.7 37.1* 39.0  MCV 73.7* 76.9* 76.0* 76.8*  PLT 287 275 248 274   Cardiac Enzymes: Recent Labs  Lab 08/06/24 1907  CKTOTAL 57   BNP: Invalid input(s): POCBNP CBG: No results for input(s): GLUCAP in the last 168 hours. D-Dimer No results for input(s): DDIMER in the last 72 hours. Hgb A1c No results for input(s): HGBA1C in the last 72 hours. Lipid Profile No results for input(s): CHOL, HDL, LDLCALC, TRIG, CHOLHDL, LDLDIRECT in the last 72 hours. Thyroid function studies No results for input(s): TSH, T4TOTAL, T3FREE, THYROIDAB in the last 72 hours.  Invalid input(s): FREET3 Anemia work up No results for input(s): VITAMINB12, FOLATE, FERRITIN, TIBC, IRON, RETICCTPCT in the last 72 hours. Urinalysis    Component Value Date/Time   COLORURINE YELLOW (A) 08/03/2024 1101   APPEARANCEUR CLEAR (A) 08/03/2024 1101   LABSPEC 1.025 08/03/2024 1101   PHURINE 5.0 08/03/2024 1101   GLUCOSEU NEGATIVE 08/03/2024 1101   HGBUR NEGATIVE  08/03/2024 1101   BILIRUBINUR NEGATIVE 08/03/2024 1101   KETONESUR NEGATIVE 08/03/2024 1101   PROTEINUR NEGATIVE 08/03/2024 1101   NITRITE NEGATIVE 08/03/2024 1101   LEUKOCYTESUR NEGATIVE 08/03/2024 1101   Sepsis Labs Recent Labs  Lab 08/02/24 1847 08/04/24 0522 08/05/24 0644 08/06/24 1907  WBC 7.7 7.2 6.1 7.2   Microbiology No results found for this or any previous visit (from the past 240 hours).   Time coordinating discharge: 40 minutes   SIGNED:   [name], MD  [facility] 08/07/2024, 10:07 AM Pager   If 7PM-7AM, please contact night-coverage     [1]  Allergies Allergen Reactions   Bee Venom Anaphylaxis   Gabapentin  Other (See Comments) and Anaphylaxis    Muscle twitching   Haldol [Haloperidol] Anaphylaxis and Other (See Comments)    Per father, dystonic reaction with  muscle contractures- also   Hornet Venom Anaphylaxis and Itching   Peanut-Containing Drug Products Anaphylaxis   Wasp Venom Anaphylaxis   Beeswax Swelling

## 2024-08-07 NOTE — Consult Note (Signed)
 [facility] Consult Follow Up  Patient Name: .Vincent Frank  MRN: [id number]  DOB: 2000-08-24  Consult Order details:  Orders (From admission, onward)     Start     Ordered   08/02/24 1841  CONSULT TO CALL ACT TEAM       Ordering Provider: Jacolyn Pae, MD  Provider:  (Not yet assigned)  Question:  Reason for Consult?  Answer:  Psych consult   08/02/24 1841   08/02/24 1841  IP CONSULT TO PSYCHIATRY       Ordering Provider: Jacolyn Pae, MD  Provider:  (Not yet assigned)  Question Answer Comment  Reason for consult: Other (see comments)   Comments: Intentional overdose      08/02/24 1841             Mode of Visit: In person    Psychiatry Consult Evaluation  Service Date: August 07, 2024 LOS:  LOS: 2 days  Chief Complaint Intentional overdose  Primary Psychiatric Diagnoses  Suicidal overdose   Assessment   08/07/2024: Patient was assessed by psychiatry today. Patient denied current suicidal or homicidal ideation. He denied auditory or visual hallucinations. Patient continues to acknowledge that his initial overdose was an intentional suicide attempt following an argument with his family. However, despite denying current suicidal ideation, patient remains at high risk for self-harm at this time. Patient's recent intentional overdose, combined with apparent impulse control issues as evidenced by making a suicide attempt in response to an acute interpersonal conflict with family, demonstrates a pattern of poor coping and impulsive dangerous behaviors when faced with stressors. The short timeframe between the argument and suicide attempt suggests patient has difficulty regulating emotions and implementing alternative coping strategies during moments of distress, which places him at continued elevated risk for future impulsive self-harm if he encounters similar triggers or stressors.Recommendation continues for inpatient psychiatric admission  following medical clearance. On current mental status examination, there was no objective evidence of mania, psychosis, or patient responding to internal stimuli.Patient was updated on the treatment plan and recommendation for inpatient psychiatric admission. Patient verbalized understanding of the care plan at this time.    08/06/2024: Patient was seen today during psychiatric consultation rounds. Patient was alert and oriented X 3, calm, cooperative, and engaged in the interview. Patient was drowsy and lethargic during the conversation. He reports that he feels tired and weak and just prefers to sleep. Per clinical staff, patient has had trials of ambulating to assess his ability to walk and maintain his balance. Unfortunately, patient has not had the ability to walk and balance himself independently. Patient remains extremely tremulous with movement per the clinical medical team. Patient reports his appetite is gradually returning. He continues to endorse feeling depressed, but he denies suicidal ideations. Patient reports having some anxiety as well which he rates both 2 out of 10. Patient is observed minimizing his symptoms today. Patient also denies homicidal ideations and perceptual disturbances. Patient remains on 1:1 observation per protocol and has a sister at bedside. Will continue to provide consultation until he transitions to the inpatient unit.   Patient is recommended for inpatient psychiatric admission following medical clearance based on significant clinical concerns. Patient presents with active and repeated suicide attempts, including the current presentation for intentional overdose on Benadryl  and possibly Wellbutrin, occurring shortly after a recent intentional Tylenol  overdose approximately one week ago on July 27, 2024. This pattern of repeated suicide attempts within a short timeframe represents imminent and ongoing danger to self requiring  intensive psychiatric intervention in a  secure, monitored environment.  Patient has extensive psychiatric history including major depressive disorder, PTSD, likely borderline personality disorder, psychogenic nonepileptic seizures, and documented history of multiple suicide attempts over time. Patient's mental health conditions are currently untreated, as he has no established psychiatrist or medication management provider. Patient has only attended one therapy session at [facility], indicating he is in the very early stages of establishing outpatient mental health support and does not yet have adequate therapeutic relationship or coping skills developed to maintain safety in the community.  Patient identifies family conflict as a major trigger for his suicidal behavior and describes living in a chronically stressful home environment with mother, father, and sister, with whom he reports strained relationships and feelings of being less valued than his sister. Patient's living situation appears to be a significant ongoing stressor that contributes to his suicidal ideation and behavior. Patient explicitly does not want family involved in his care at this time.  Patient has limited treatment response history, having tried multiple medication trials (Prozac , Abilify  which worsened suicidal thoughts) and ECT treatment approximately 2 years ago that provided only temporary relief for a couple of months before depression returned. 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  Patient is currently unemployed due to mental health factors, which may contribute to feelings of worthlessness, hopelessness, and lack of purpose that can worsen depression and increase suicide risk. Patient has minimal outpatient support system established and no current psychiatric medication management, leaving him without adequate  safety net or treatment resources in the community.  Inpatient psychiatric admission will provide safe, secure environment for suicide prevention and intensive monitoring during this high-risk period with pattern of repeated attempts, comprehensive psychiatric evaluation and diagnostic clarification, including assessment for borderline personality disorder given chart documentation and pattern of repeated suicide attempts in context of interpersonal stressors, medication initiation and optimization for treatment-resistant depression, including consideration of medication combinations or novel approaches, establishment of intensive individual therapy to address underlying depression, PTSD, family conflict, and maladaptive coping patterns, dialectical behavior therapy (DBT) or other evidence-based interventions if borderline personality disorder is confirmed, to address emotion dysregulation and self-harm behaviors.  The recommendation for inpatient psychiatric admission following medical clearance was discussed with both patient and the primary medical team. Patient appeared to understand the recommendation and rationale for this level of care. Patient will remain medically hospitalized until cleared by the primary medical team, at which time he will be transferred to an inpatient psychiatric facility for continued evaluation, stabilization, and comprehensive treatment of his treatment-resistant depression, repeated suicide attempts, and complex psychiatric needs.     Diagnoses:  Active Hospital problems: Principal Problem:   Suicidal overdose, initial encounter [facility]) Active Problems:   SSRI overdose, intentional self-harm, initial encounter (HCC)   Intentional SSRI (selective serotonin reuptake inhibitor) overdose (HCC)    Plan   ## Psychiatric Medication Recommendations:  Will defer further management to inpatient psychiatric unit  ## Medical Decision Making Capacity: Not specifically  addressed in this encounter  ## Further Work-up:   -- most recent EKG on 08/03/2024 had QtC of 457 -- Pertinent labwork reviewed earlier this admission includes: BMP, CBC, TSH, CMP, acetaminophen  level, ethanol, salicylate level, CBC   ## Disposition:-- Patient is under IVC and recommended for inpatient psychiatric admission after medical stabilization  ## Behavioral / Environmental: - No specific recommendations at this time.     ## Safety and Observation Level:  -  Based on my clinical evaluation, I estimate the patient to be at moderate risk of self harm in the current setting. - At this time, we recommend  1:1 Observation. This decision is based on my review of the chart including patient's history and current presentation, interview of the patient, mental status examination, and consideration of suicide risk including evaluating suicidal ideation, plan, intent, suicidal or self-harm behaviors, risk factors, and protective factors. This judgment is based on our ability to directly address suicide risk, implement suicide prevention strategies, and develop a safety plan while the patient is in the clinical setting. Please contact our team if there is a concern that risk level has changed.  CSSR Risk Category:C-SSRS RISK CATEGORY: High Risk  Suicide Risk Assessment: Patient has following modifiable risk factors for suicide: lack of access to outpatient mental health resources, current symptoms: anxiety/panic, insomnia, impulsivity, anhedonia, hopelessness, triggering events, and recent psychiatric hospitalization, which we are addressing by recommending inpatient psychiatric admission for further monitoring and stabilization. Patient has following non-modifiable or demographic risk factors for suicide: male gender, history of suicide attempt, and psychiatric hospitalization Patient has the following protective factors against suicide: Access to outpatient mental health care  Thank you for this  consult request. Recommendations have been communicated to the primary team.  We will continue to round while medically admitted at this time.   Zelda Sharps, NP         History of Present Illness  Relevant Aspects of [facility]   Patient Report:  Patient was assessed by psychiatry today. Patient endorsed intentionally overdosing on medications in an attempt to harm himself due to family conflict. Patient reported he knows he intentionally took Benadryl  and stated he may have also taken Wellbutrin along with this, but reported he was honestly unsure about the Wellbutrin. Of note, chart review reveals patient has a recent intentional overdose on Tylenol  documented around July 27, 2024 of this year. Patient endorsed multiple suicide attempts in the past.  Patient reported that family is a major trigger for him and stated they know how to trigger me. Patient expressed he does not want family involved in his care currently. Patient reported he lives at home with his mother, father, and sister. He referred to his sister as the golden child and endorsed that relationships with all of his family members are strained, suggesting feelings of being less favored or valued within the family dynamic.  Patient reported he currently sees a therapist through [facility] but has only attended 1 session thus far. However, patient stated he does not see a psychiatrist or medication management provider at this time. Patient reported his Wellbutrin prescription is from a long time ago and that PCPs have just continued the medication. Patient reported in the past he has trialed Prozac  and Abilify , with Abilify  reportedly worsening his suicidal thoughts. Patient also reported completing 12 sessions of electroconvulsive therapy (ECT) approximately 2 years ago, which he stated helped for a couple of months, but his depression returned after that period.  Patient endorsed previous mental health  history of major depressive disorder and post-traumatic stress disorder. Through chart review, there was mention of potential borderline personality disorder. However, patient did not list this diagnosis during today's assessment. Patient endorsed history of seizures but reported his last known seizure was a couple of years ago. Chart review of recent admission notes documented through [facility] lists patient's history as including borderline personality disorder, psychogenic nonepileptic seizures, major depressive disorder, PTSD,  and history of multiple suicide attempts.  Patient endorsed rare alcohol use. He denied any other recreational drug use or tobacco use. Patient reported previous inpatient psychiatric admissions, with the most recent being what he described as last week when he intentionally overdosed on Tylenol . Patient denied any access to firearms or weapons and denied any upcoming known legal charges. Patient reported he is not currently employed, as he had to leave the workforce due to many factors including his mental health struggles.  Psych ROS:  Depression: Endorsed Anxiety: Endorsed Mania (lifetime and current): Denied Psychosis: (lifetime and current): Denied  Collateral information:  Patient declined to provide contact for collateral information at this time    Psychiatric and Social History  Psychiatric History:  Information collected from patient/chart review  Prev Dx/Sx: MDD, borderline personality disorder, PTSD, multiple previous suicide attempts Current Psych Provider: Denied Home Meds (current): Wellbutrin Previous Med Trials: Abilify , Prozac  Therapy: Yes  Prior Psych Hospitalization: Yes Prior Self Harm: Yes Prior Violence: Denied  Family Psych History: Reported brother has history of bipolar disorder Family Hx suicide: Denied  Social History:   Educational Hx: Some college 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  Hx: Denied Living Situation: With mother, father and sister  Access to weapons/lethal means: Denied access to firearms  Substance History Alcohol: Endorsed occasional use History of alcohol withdrawal seizures denied History of DT's denied Tobacco: Denied Illicit drugs: Denied Prescription drug abuse: Denied Rehab hx: Denied  Exam Findings  Physical Exam: Reviewed and agree with the physical exam findings conducted by the medical provider Vital Signs:  Temp:  [97.8 F (36.6 C)-98.6 F (37 C)] 98.6 F (37 C) (01/12 0939) Pulse Rate:  [77-92] 88 (01/12 0939) Resp:  [17-18] 18 (01/12 0939) BP: (105-131)/(63-99) 123/70 (01/12 0939) SpO2:  [96 %-98 %] 98 % (01/12 0939) Blood pressure 123/70, pulse 88, temperature 98.6 F (37 C), resp. rate 18, height 6' 3 (1.905 m), weight 103 kg, SpO2 98%. Body mass index is 28.37 kg/m.    Mental Status Exam: General Appearance: Fairly Groomed  Orientation:  Full (Time, Place, and Person)  Memory:  Fair  Concentration:  Concentration: Fair  Recall:  Fair  Attention  Fair  Eye Contact:  Minimal  Speech:  Clear and Coherent  Language:  Fair  Volume:  Normal  Mood: Depressed  Affect:  Depressed  Thought Process:  Coherent and Linear  Thought Content:  Logical  Suicidal Thoughts:  Yes.  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  Family History:  The patient's family history includes Healthy in his mother.  Medical History: Past Medical History:  Diagnosis Date   Anxiety    Depression    Major  depressive disorder    Seizures (HCC)     Surgical History: Past Surgical History:  Procedure Laterality Date   NO PAST SURGERIES       Medications:  Current Medications[1]  Allergies: Allergies[2]  Zelda Sharps, NP This note was created using Dragon dictation software. Please excuse  any inadvertent transcription errors. Case was discussed with supervising physician Dr. Jadapalle who is agreeable with current plan.         [1]  Current Facility-Administered Medications:    acetaminophen  (TYLENOL ) tablet 650 mg, 650 mg, Oral, Q6H PRN **OR** acetaminophen  (TYLENOL ) suppository 650 mg, 650 mg, Rectal, Q6H PRN, Laurita Manor T, MD   enoxaparin  (LOVENOX ) injection 40 mg, 40 mg, Subcutaneous, Q24H, Zhang, Ping T, MD, 40 mg at 08/05/24 2138   feeding supplement (ENSURE PLUS HIGH PROTEIN) liquid 237 mL, 237 mL, Oral, BID BM, Laurita Manor T, MD, 237 mL at 08/06/24 1304   FLUoxetine  (PROZAC ) capsule 20 mg, 20 mg, Oral, Daily, Siadecki, Sebastian, MD, 20 mg at 08/07/24 9050   hydrOXYzine  (ATARAX ) tablet 25 mg, 25 mg, Oral, Q8H PRN, Laurita Manor T, MD   metoCLOPramide  (REGLAN ) injection 10 mg, 10 mg, Intravenous, Q6H PRN, Zhang, Ping T, MD, 10 mg at 08/06/24 1846   OLANZapine  (ZYPREXA ) tablet 10 mg, 10 mg, Oral, QHS, Laurita Manor T, MD, 10 mg at 08/06/24 2333   QUEtiapine  (SEROQUEL ) tablet 100 mg, 100 mg, Oral, QHS, Laurita Manor T, MD, 100 mg at 08/06/24 2333   senna-docusate (Senokot-S) tablet 1 tablet, 1 tablet, Oral, QHS PRN, Laurita Manor T, MD   tamsulosin  (FLOMAX ) capsule 0.4 mg, 0.4 mg, Oral, Daily, Sreenath, Sudheer B, MD, 0.4 mg at 08/06/24 1703   zolpidem  (AMBIEN ) tablet 5 mg, 5 mg, Oral, QHS PRN,MR X 1, Zhang, Ping T, MD [2]  Allergies Allergen Reactions   Bee Venom Anaphylaxis   Gabapentin  Other (See Comments) and Anaphylaxis    Muscle twitching   Haldol [Haloperidol] Anaphylaxis and Other (See Comments)    Per father, dystonic reaction with  muscle contractures- also   Hornet  Venom Anaphylaxis and Itching   Peanut-Containing Drug Products Anaphylaxis   Wasp Venom Anaphylaxis   Beeswax Swelling

## 2024-08-07 NOTE — Plan of Care (Signed)
" °  Problem: Education: Goal: Knowledge of General Education information will improve Description: Including pain rating scale, medication(s)/side effects and non-pharmacologic comfort measures Outcome: Progressing   Problem: Clinical Measurements: Goal: Respiratory complications will improve Outcome: Progressing   Problem: Clinical Measurements: Goal: Cardiovascular complication will be avoided Outcome: Progressing   Problem: Nutrition: Goal: Adequate nutrition will be maintained Outcome: Progressing   Problem: Coping: Goal: Level of anxiety will decrease Outcome: Progressing   Problem: Elimination: Goal: Will not experience complications related to bowel motility Outcome: Progressing   Problem: Elimination: Goal: Will not experience complications related to urinary retention Outcome: Progressing   Problem: Pain Managment: Goal: General experience of comfort will improve and/or be controlled Outcome: Progressing   Problem: Safety: Goal: Ability to remain free from injury will improve Outcome: Progressing   "

## 2024-08-07 NOTE — TOC Progression Note (Signed)
 Transition of Care [facility]) - Progression Note    Patient Details  Name: Vincent Frank MRN: [id number] Date of Birth: Dec 16, 2000  Transition of Care [facility]) CM/SW Contact  Corean ONEIDA Haddock, RN Phone Number: 08/07/2024, 11:09 AM  Clinical Narrative:        Referral for inpatient psych submitted in the HUB                 Expected Discharge Plan and Services         Expected Discharge Date: 08/07/24                                     Social Drivers of Health (SDOH) Interventions SDOH Screenings   Food Insecurity: No Food Insecurity (08/03/2024)  Recent Concern: Food Insecurity - Food Insecurity Present (07/28/2024)   Received from [facility]  Housing: Unknown (08/03/2024)  Recent Concern: Housing - High Risk (07/28/2024)   Received from [facility]  Transportation Needs: No Transportation Needs (08/03/2024)  Recent Concern: Transportation Needs - Unmet Transportation Needs (07/13/2024)   Received from [facility]  Utilities: Not At Risk (08/03/2024)  Recent Concern: Utilities - High Risk (07/13/2024)   Received from [facility]  Alcohol Screen: Low Risk (06/03/2023)  Financial Resource Strain: Medium Risk (07/13/2024)   Received from [facility]  Physical Activity: Sufficiently Active (07/13/2024)   Received from [facility]  Social Connections: Patient Declined (08/03/2024)  Recent Concern: Social Connections - Moderately Isolated (07/13/2024)   Received from [facility]  Stress: Stress Concern Present (07/28/2024)   Received from [facility]  Tobacco Use: Low Risk (08/02/2024)  Health Literacy: Low Risk (07/13/2024)   Received from [facility]    Readmission Risk Interventions     No data to display

## 2024-08-08 ENCOUNTER — Encounter: Payer: Self-pay | Admitting: Internal Medicine

## 2024-08-08 ENCOUNTER — Inpatient Hospital Stay

## 2024-08-08 DIAGNOSIS — F445 Conversion disorder with seizures or convulsions: Secondary | ICD-10-CM

## 2024-08-08 DIAGNOSIS — T50902A Poisoning by unspecified drugs, medicaments and biological substances, intentional self-harm, initial encounter: Secondary | ICD-10-CM | POA: Diagnosis not present

## 2024-08-08 LAB — CBC WITH DIFFERENTIAL/PLATELET
Abs Immature Granulocytes: 0.02 K/uL (ref 0.00–0.07)
Basophils Absolute: 0 K/uL (ref 0.0–0.1)
Basophils Relative: 1 %
Eosinophils Absolute: 0.3 K/uL (ref 0.0–0.5)
Eosinophils Relative: 4 %
HCT: 37.9 % — ABNORMAL LOW (ref 39.0–52.0)
Hemoglobin: 12.5 g/dL — ABNORMAL LOW (ref 13.0–17.0)
Immature Granulocytes: 0 %
Lymphocytes Relative: 34 %
Lymphs Abs: 2.2 K/uL (ref 0.7–4.0)
MCH: 24.9 pg — ABNORMAL LOW (ref 26.0–34.0)
MCHC: 33 g/dL (ref 30.0–36.0)
MCV: 75.3 fL — ABNORMAL LOW (ref 80.0–100.0)
Monocytes Absolute: 0.6 K/uL (ref 0.1–1.0)
Monocytes Relative: 10 %
Neutro Abs: 3.4 K/uL (ref 1.7–7.7)
Neutrophils Relative %: 51 %
Platelets: 218 K/uL (ref 150–400)
RBC: 5.03 MIL/uL (ref 4.22–5.81)
RDW: 13.7 % (ref 11.5–15.5)
WBC: 6.5 K/uL (ref 4.0–10.5)
nRBC: 0 % (ref 0.0–0.2)

## 2024-08-08 LAB — COMPREHENSIVE METABOLIC PANEL WITH GFR
ALT: 41 U/L (ref 0–44)
AST: 26 U/L (ref 15–41)
Albumin: 4.2 g/dL (ref 3.5–5.0)
Alkaline Phosphatase: 88 U/L (ref 38–126)
Anion gap: 11 (ref 5–15)
BUN: 10 mg/dL (ref 6–20)
CO2: 23 mmol/L (ref 22–32)
Calcium: 9.6 mg/dL (ref 8.9–10.3)
Chloride: 107 mmol/L (ref 98–111)
Creatinine, Ser: 1.05 mg/dL (ref 0.61–1.24)
GFR, Estimated: >60 mL/min
Glucose, Bld: 90 mg/dL (ref 70–99)
Potassium: 4.2 mmol/L (ref 3.5–5.1)
Sodium: 141 mmol/L (ref 135–145)
Total Bilirubin: <0.2 mg/dL (ref 0.0–1.2)
Total Protein: 6.8 g/dL (ref 6.5–8.1)

## 2024-08-08 LAB — URINALYSIS, ROUTINE W REFLEX MICROSCOPIC
Bilirubin Urine: NEGATIVE
Glucose, UA: NEGATIVE mg/dL
Hgb urine dipstick: NEGATIVE
Ketones, ur: NEGATIVE mg/dL
Nitrite: NEGATIVE
Protein, ur: NEGATIVE mg/dL
Specific Gravity, Urine: 1.019 (ref 1.005–1.030)
Squamous Epithelial / HPF: 0 /HPF (ref 0–5)
pH: 6 (ref 5.0–8.0)

## 2024-08-08 LAB — URINE DRUG SCREEN
Amphetamines: NEGATIVE
Barbiturates: NEGATIVE
Benzodiazepines: POSITIVE — AB
Cocaine: NEGATIVE
Fentanyl: NEGATIVE
Methadone Scn, Ur: NEGATIVE
Opiates: NEGATIVE
Tetrahydrocannabinol: NEGATIVE

## 2024-08-08 LAB — MAGNESIUM: Magnesium: 2 mg/dL (ref 1.7–2.4)

## 2024-08-08 LAB — GLUCOSE, CAPILLARY
Glucose-Capillary: 106 mg/dL — ABNORMAL HIGH (ref 70–99)
Glucose-Capillary: 116 mg/dL — ABNORMAL HIGH (ref 70–99)
Glucose-Capillary: 117 mg/dL — ABNORMAL HIGH (ref 70–99)
Glucose-Capillary: 162 mg/dL — ABNORMAL HIGH (ref 70–99)

## 2024-08-08 LAB — MRSA NEXT GEN BY PCR, NASAL: MRSA by PCR Next Gen: NOT DETECTED

## 2024-08-08 LAB — LACTIC ACID, PLASMA: Lactic Acid, Venous: 1.2 mmol/L (ref 0.5–1.9)

## 2024-08-08 MED ORDER — LORAZEPAM 2 MG/ML IJ SOLN: 2.0000 mg | Freq: Once | INTRAMUSCULAR | Status: DC

## 2024-08-08 MED ORDER — LORAZEPAM 2 MG/ML IJ SOLN: 4.0000 mg | Freq: Once | INTRAMUSCULAR | Status: DC

## 2024-08-08 MED ORDER — LORAZEPAM 2 MG/ML IJ SOLN
INTRAMUSCULAR | Status: AC
  Administered 2024-08-09: 2 mg via INTRAVENOUS
  Filled 2024-08-08: qty 2

## 2024-08-08 MED ORDER — ORAL CARE MOUTH RINSE: 15.0000 mL | OROMUCOSAL | Status: DC | PRN

## 2024-08-08 MED ORDER — LEVETIRACETAM (KEPPRA) 500 MG/5 ML ADULT IV PUSH
4500.0000 mg | Freq: Once | INTRAVENOUS | Status: AC
  Administered 2024-08-08: 4500 mg via INTRAVENOUS
  Filled 2024-08-08: qty 45

## 2024-08-08 MED ORDER — LEVETIRACETAM (KEPPRA) 500 MG/5 ML ADULT IV PUSH
500.0000 mg | Freq: Two times a day (BID) | INTRAVENOUS | Status: DC
  Filled 2024-08-08 (×2): qty 5

## 2024-08-08 MED ORDER — SODIUM CHLORIDE 0.9 % IV SOLN: INTRAVENOUS | Status: DC

## 2024-08-08 MED ORDER — LORAZEPAM 2 MG/ML IJ SOLN
INTRAMUSCULAR | Status: AC
  Administered 2024-08-08: 4 mg
  Filled 2024-08-08: qty 2

## 2024-08-08 MED ORDER — LORAZEPAM 2 MG/ML IJ SOLN: 1.0000 mg | INTRAMUSCULAR | Status: DC | PRN

## 2024-08-08 MED ORDER — SODIUM CHLORIDE 0.9 % IV BOLUS
1000.0000 mL | Freq: Once | INTRAVENOUS | Status: AC
  Administered 2024-08-08: 1000 mL via INTRAVENOUS

## 2024-08-08 MED ORDER — LORAZEPAM 2 MG/ML IJ SOLN
INTRAMUSCULAR | Status: AC
  Filled 2024-08-08: qty 2

## 2024-08-08 MED ORDER — DIVALPROEX SODIUM 125 MG PO CSDR
500.0000 mg | DELAYED_RELEASE_CAPSULE | Freq: Two times a day (BID) | ORAL | Status: DC
  Administered 2024-08-08 (×2): 500 mg via ORAL
  Filled 2024-08-08 (×2): qty 4

## 2024-08-08 MED ORDER — CHLORHEXIDINE GLUCONATE CLOTH 2 % EX PADS: 6.0000 | MEDICATED_PAD | Freq: Every day | CUTANEOUS | Status: DC

## 2024-08-08 MED ORDER — LORAZEPAM 2 MG/ML IJ SOLN
4.0000 mg | Freq: Once | INTRAMUSCULAR | Status: AC
  Administered 2024-08-08: 4 mg via INTRAVENOUS

## 2024-08-08 NOTE — Plan of Care (Signed)
" °  Problem: Education: Goal: Knowledge of General Education information will improve Description: Including pain rating scale, medication(s)/side effects and non-pharmacologic comfort measures Outcome: Progressing   Problem: Health Behavior/Discharge Planning: Goal: Ability to manage health-related needs will improve Outcome: Progressing   Problem: Clinical Measurements: Goal: Ability to maintain clinical measurements within normal limits will improve Outcome: Progressing Goal: Will remain free from infection Outcome: Progressing Goal: Diagnostic test results will improve Outcome: Progressing Goal: Respiratory complications will improve Outcome: Progressing Goal: Cardiovascular complication will be avoided Outcome: Progressing   Problem: Nutrition: Goal: Adequate nutrition will be maintained Outcome: Progressing   Problem: Coping: Goal: Level of anxiety will decrease Outcome: Progressing   Problem: Activity: Goal: Risk for activity intolerance will decrease Outcome: Not Progressing   Problem: Elimination: Goal: Will not experience complications related to urinary retention Outcome: Not Progressing   "

## 2024-08-08 NOTE — Progress Notes (Signed)
 Called to by sitter and RN Merilee for witnessed seizure activity. Patient vitals remained stable during episode. Per sitter, patient said he ws not feeling well, eyes rolled back and he had new tremor activity in extremities, unable to respond to sitter.  CBG collected WDL. Episode resolved spontaneously; no medications given to patient for seizure episode.  MD Jhonny and MD Matthews notified by RN. Matthews to bedside currently assessing and speaking with patient.

## 2024-08-08 NOTE — Progress Notes (Signed)
 Pt was using the bathroom when sitter case called for help. Pt was found in the floor unresponsive. Per sitter he was fine walking tot he bathroom and was heading back to the bed, but pt starts complaining of dizziness. RRR team was called and came at bedside. Donati-Garmon NP instructed RN staff to administer 4 mg IV lorazepam  once at 0235.

## 2024-08-08 NOTE — Progress Notes (Signed)
 " PROGRESS NOTE    Vincent Frank  FMW:[id number] DOB: June 15, 2001 DOA: 08/02/2024 PCP: [name], MD    Brief Narrative:  24 y.o. male with medical history significant of MDD, seizure disorder, presented with suicidal ideation and suicidal attempt with Wellbutrin and Benadryl  overdose.   Patient showed up in ED yesterday evening complaining of  feeling a lot of stress in life and took many pills of Wellbutrin and Benadryl  estimated time of ingestion was around 1700.  Patient was lethargic but oriented x 3.  No other complaints such as pain, no nauseous vomiting no headache or vision changes.  No fall at home.  1/13: RRT called early this morning due to seizure-like activity.  Patient transferred to stepdown.  Seizure monitoring.  Neurology consulted.   Assessment & Plan:   Principal Problem:   Suicidal overdose, initial encounter [facility]) Active Problems:   SSRI overdose, intentional self-harm, initial encounter (HCC)   Intentional SSRI (selective serotonin reuptake inhibitor) overdose (HCC)   Seizure-like activity RRT called early a.m. 1/13 due to patient being found down in the bathroom.  Noted him to have seizure-like activity with left gaze deviation and left hand tremors.  Received total 6 mg IV Ativan  and transferred to stepdown. Plan: Neurology consulted.  Case discussed with Dr. Matthews.  These episodes are not electrical seizures and Ativan  is not indicated.  Depakote  started after conversation between neurology and psychiatry.  SSRI overdose Benadryl  overdose Suicidal ideation and suicidal attempt Neurologic exam relatively benign however does have some persistent tremors These appear to worsen when patient attempting to ambulate this has been persistent over the last 48 hours Plan: Will continue to monitor on the medicine service.  Continue IVC.  Continue one-to-one sitter.  Psychiatry following in consultation role.  Patient has an accepting facility in [place] .  TOC spoke with the facility who states they will hold the bed until medical workup is complete.  AKI Urinary retention Likely secondary to ingestion of anticholinergics Resolved Plan: No IV fluids Encourage p.o. intake and ambulation    Prolonged Qtc Tremors Repeat EKG with improved QTc within reference range.  Patient still having tremors. QTc prolongation resolved Tremors resolved Plan: IV fluids   Major depression disorder Psychiatry consulted Continue IVC Plan to transfer to inpatient behavioral unit at [facility] once he is medically stabilized     DVT prophylaxis: SCD Code Status: Full Family Communication: None at bedside.  Patient removed all family members from contact lists and declines offers to call any family members for update Disposition Plan: Status is: Inpatient Remains inpatient appropriate because: Seizure-like activity.  Medication adjustment made.  Recommend continued monitoring x 48 hours or until neurology clears for discharge.     Level of care: Stepdown  Consultants:  Psychiatry Neurology  Procedures:  None  Antimicrobials: None   Subjective: Seen and examined.  Lethargic this morning.  Received 6 mg IV Ativan  earlier this a.m.  Objective: Vitals:   08/08/24 1100 08/08/24 1200 08/08/24 1300 08/08/24 1400  BP: 124/66 125/60 112/61 133/68  Pulse: 85 82 82 91  Resp: 16 18 17 17   Temp:  98.3 F (36.8 C)    TempSrc:      SpO2: 97% 97% 96% 95%  Weight:      Height:        Intake/Output Summary (Last 24 hours) at 08/08/2024 1438 Last data filed at 08/08/2024 0930 Gross per 24 hour  Intake 1168.99 ml  Output --  Net 1168.99  ml   Filed Weights   08/03/24 1546 08/08/24 0325  Weight: 103 kg 109.6 kg    Examination:  General exam: Lethargic Respiratory system: Lungs clear.  No work of breathing.  Room air Cardiovascular system: S1-S2, RRR, no murmurs, no pedal edema Gastrointestinal system:,  Soft, NT/ND, normal bowel sounds Central nervous system: Alert and oriented. No focal neurological deficits. Extremities: Symmetric 5 x 5 power. Skin: No rashes, lesions or ulcers Psychiatry: Judgement and insight appear normal. Mood & affect blunted.     Data Reviewed: I have personally reviewed following labs and imaging studies  CBC: Recent Labs  Lab 08/02/24 1847 08/04/24 0522 08/05/24 0644 08/06/24 1907 08/08/24 0523  WBC 7.7 7.2 6.1 7.2 6.5  NEUTROABS 5.6  --  3.2 4.6 3.4  HGB 13.5 13.3 12.0* 12.6* 12.5*  HCT 41.0 41.7 37.1* 39.0 37.9*  MCV 73.7* 76.9* 76.0* 76.8* 75.3*  PLT 287 275 248 274 218   Basic Metabolic Panel: Recent Labs  Lab 08/02/24 1847 08/03/24 0818 08/04/24 0522 08/05/24 0644 08/06/24 1907 08/08/24 0523  NA 139 139 140 140 139 141  K 3.8 4.3 4.2 3.9 4.5 4.2  CL 101 104 103 108 104 107  CO2 22 24 25 23 24 23   GLUCOSE 114* 92 100* 93 92 90  BUN 15 12 19 14 10 10   CREATININE 1.13 1.15 1.37* 1.05 1.05 1.05  CALCIUM 10.5* 10.0 9.6 9.3 9.8 9.6  MG 2.0  --  2.2  --  1.8 2.0  PHOS  --  5.7*  --   --  2.8  --    GFR: Estimated Creatinine Clearance: 148.8 mL/min (by C-G formula based on SCr of 1.05 mg/dL). Liver Function Tests: Recent Labs  Lab 08/02/24 1847 08/03/24 0818 08/05/24 0644 08/06/24 1907 08/08/24 0523  AST 24 21 18 25 26   ALT 38 31 26 35 41  ALKPHOS 98 94 89 99 88  BILITOT 0.4 0.6 0.3 <0.2 <0.2  PROT 8.5* 8.0 6.7 7.1 6.8  ALBUMIN 5.2* 4.9 4.2 4.5 4.2   No results for input(s): LIPASE, AMYLASE in the last 168 hours. Recent Labs  Lab 08/06/24 1907  AMMONIA  22   Coagulation Profile: No results for input(s): INR, PROTIME in the last 168 hours. Cardiac Enzymes: Recent Labs  Lab 08/06/24 1907  CKTOTAL 57   BNP (last 3 results) No results for input(s): PROBNP in the last 8760 hours. HbA1C: No results for input(s): HGBA1C in the last 72 hours. CBG: Recent Labs  Lab 08/08/24 0220 08/08/24 0250 08/08/24 1021   GLUCAP 116* 117* 162*   Lipid Profile: No results for input(s): CHOL, HDL, LDLCALC, TRIG, CHOLHDL, LDLDIRECT in the last 72 hours. Thyroid Function Tests: No results for input(s): TSH, T4TOTAL, FREET4, T3FREE, THYROIDAB in the last 72 hours.  Anemia Panel: No results for input(s): VITAMINB12, FOLATE, FERRITIN, TIBC, IRON, RETICCTPCT in the last 72 hours. Sepsis Labs: Recent Labs  Lab 08/08/24 0523  LATICACIDVEN 1.2    Recent Results (from the past 240 hours)  Resp panel by RT-PCR (RSV, Flu A&B, Covid)     Status: None   Collection Time: 08/07/24  1:22 PM  Result Value Ref Range Status   SARS Coronavirus 2 by RT PCR NEGATIVE NEGATIVE Final    Comment: (NOTE) SARS-CoV-2 target nucleic acids are NOT DETECTED.  The SARS-CoV-2 RNA is generally detectable in upper respiratory specimens during the acute phase of infection. The lowest concentration of SARS-CoV-2 viral copies this assay can detect is 138 copies/mL.  A negative result does not preclude SARS-Cov-2 infection and should not be used as the sole basis for treatment or other patient management decisions. A negative result may occur with  improper specimen collection/handling, submission of specimen other than nasopharyngeal swab, presence of viral mutation(s) within the areas targeted by this assay, and inadequate number of viral copies(<138 copies/mL). A negative result must be combined with clinical observations, patient history, and epidemiological information. The expected result is Negative.  Fact Sheet for Patients:  bloggercourse.com  Fact Sheet for Healthcare Providers:  seriousbroker.it  This test is no t yet approved or cleared by the [place]  FDA and  has been authorized for detection and/or diagnosis of SARS-CoV-2 by FDA under an Emergency Use Authorization (EUA). This EUA will remain  in effect (meaning this test can be  used) for the duration of the COVID-19 declaration under Section 564(b)(1) of the Act, 21 U.S.C.section 360bbb-3(b)(1), unless the authorization is terminated  or revoked sooner.       Influenza A by PCR NEGATIVE NEGATIVE Final   Influenza B by PCR NEGATIVE NEGATIVE Final    Comment: (NOTE) The Xpert Xpress SARS-CoV-2/FLU/RSV plus assay is intended as an aid in the diagnosis of influenza from Nasopharyngeal swab specimens and should not be used as a sole basis for treatment. Nasal washings and aspirates are unacceptable for Xpert Xpress SARS-CoV-2/FLU/RSV testing.  Fact Sheet for Patients: bloggercourse.com  Fact Sheet for Healthcare Providers: seriousbroker.it  This test is not yet approved or cleared by the [place]  FDA and has been authorized for detection and/or diagnosis of SARS-CoV-2 by FDA under an Emergency Use Authorization (EUA). This EUA will remain in effect (meaning this test can be used) for the duration of the COVID-19 declaration under Section 564(b)(1) of the Act, 21 U.S.C. section 360bbb-3(b)(1), unless the authorization is terminated or revoked.     Resp Syncytial Virus by PCR NEGATIVE NEGATIVE Final    Comment: (NOTE) Fact Sheet for Patients: bloggercourse.com  Fact Sheet for Healthcare Providers: seriousbroker.it  This test is not yet approved or cleared by the [place]  FDA and has been authorized for detection and/or diagnosis of SARS-CoV-2 by FDA under an Emergency Use Authorization (EUA). This EUA will remain in effect (meaning this test can be used) for the duration of the COVID-19 declaration under Section 564(b)(1) of the Act, 21 U.S.C. section 360bbb-3(b)(1), unless the authorization is terminated or revoked.  Performed at [facility], [address] [place], [place] [address]   MRSA Next Gen by PCR, Nasal     Status:  None   Collection Time: 08/08/24  3:02 AM   Specimen: Nasal Mucosa; Nasal Swab  Result Value Ref Range Status   MRSA by PCR Next Gen NOT DETECTED NOT DETECTED Final    Comment: (NOTE) The GeneXpert MRSA Assay (FDA approved for NASAL specimens only), is one component of a comprehensive MRSA colonization surveillance program. It is not intended to diagnose MRSA infection nor to guide or monitor treatment for MRSA infections. Test performance is not FDA approved in patients less than 95 years old. Performed at [facility], [address] [place], [place] [address]          Radiology Studies: CT HEAD WO CONTRAST ( ) Result Date: 08/08/2024 CLINICAL DATA:  Shell evaluation for seizure disorder, clinical change. EXAM: CT HEAD WITHOUT CONTRAST TECHNIQUE: Contiguous axial images were obtained from the base of the skull through the vertex without intravenous contrast. RADIATION DOSE REDUCTION: This exam was performed  according to the departmental dose-optimization program which includes automated exposure control, adjustment of the mA and/or kV according to patient size and/or use of iterative reconstruction technique. COMPARISON:  CT from 08/06/2024. FINDINGS: Brain: Cerebral volume within normal limits. No acute intracranial hemorrhage. No acute large vessel territory infarct. No mass lesion or midline shift. No hydrocephalus or extra-axial fluid collection. Vascular: No abnormal hyperdense vessel. Skull: Scalp soft tissues demonstrate no acute finding. Calvarium intact. Sinuses/Orbits: Globes orbital soft tissues within normal limits. Paranasal sinuses and mastoid air cells are clear. Other: None. IMPRESSION: Stable head CT.  No acute intracranial abnormality. Electronically Signed   By: Morene Hoard M.D.   On: 08/08/2024 05:46   CT HEAD WO CONTRAST ( ) Result Date: 08/06/2024 EXAM: CT HEAD WITHOUT CONTRAST 08/06/2024 09:36:35 PM TECHNIQUE: CT of the head was performed  without the administration of intravenous contrast. Automated exposure control, iterative reconstruction, and/or weight based adjustment of the mA/kV was utilized to reduce the radiation dose to as low as reasonably achievable. COMPARISON: 05/31/2024 CLINICAL HISTORY: Mental status change, unknown cause. FINDINGS: BRAIN AND VENTRICLES: No acute hemorrhage. No evidence of acute infarct. No hydrocephalus. No extra-axial collection. No mass effect or midline shift. ORBITS: No acute abnormality. SINUSES: No acute abnormality. SOFT TISSUES AND SKULL: No acute soft tissue abnormality. No skull fracture. IMPRESSION: 1. No acute intracranial abnormality. Electronically signed by: [name] MD 08/06/2024 09:53 PM EST RP Workstation: [id number]        Scheduled Meds:  Chlorhexidine  Gluconate Cloth  6 each Topical Daily   divalproex   500 mg Oral Q12H   enoxaparin  (LOVENOX ) injection  40 mg Subcutaneous Q24H   FLUoxetine   20 mg Oral Daily   LORazepam   4 mg Intravenous Once   OLANZapine   10 mg Oral QHS   tamsulosin   0.4 mg Oral Daily   Continuous Infusions:     LOS: 3 days     [name], MD [facility]   If 7PM-7AM, please contact night-coverage  08/08/2024, 2:38 PM   "

## 2024-08-08 NOTE — Consult Note (Signed)
 [place] [facility] Consult Follow Up  Patient Name: [name]  MRN: [id number]  DOB: 01-12-2001  Consult Order details:  Orders (From admission, onward)     Start     Ordered   08/02/24 1841  CONSULT TO CALL ACT TEAM       Ordering Provider: Jacolyn Pae, MD  Provider:  (Not yet assigned)  Question:  Reason for Consult?  Answer:  Psych consult   08/02/24 1841   08/02/24 1841  IP CONSULT TO PSYCHIATRY       Ordering Provider: Jacolyn Pae, MD  Provider:  (Not yet assigned)  Question Answer Comment  Reason for consult: Other (see comments)   Comments: Intentional overdose      08/02/24 1841             Mode of Visit: In person    Psychiatry Consult Evaluation  Service Date: August 08, 2024 LOS:  LOS: 3 days  Chief Complaint Intentional overdose  Primary Psychiatric Diagnoses  Suicidal overdose   Assessment   08/08/2024: Patient was seen on rounds today by psychiatry. Patient was noted to be sleeping at the time of rounds, as he had received medications during a rapid response event earlier. Please reference detailed documentation in patient's chart for specific details regarding the rapid response.  Neurology was consulted by the primary medical team. Psychiatry collaborated with neurology service and primary medical team with comprehensive chart review. Supervising psychiatrist Dr. Donnelly reviewed patient's chart and noted documented history of multiple concussions from previous participation in rugby. There is significant concern regarding patient's current impulsivity combined with documented intentional suicide attempts following familial conflict.  Concern for potential traumatic brain injury (TBI) contributing to patient's psychiatric presentation has been raised. Repetitive head trauma from multiple concussions can result in chronic traumatic encephalopathy or post-concussive syndrome, which can manifest as impulsivity, mood  dysregulation, emotional lability, and increased risk for suicide. TBI is on the differential diagnosis list and may be underlying or exacerbating patient's psychiatric symptoms and poor impulse control.  During collaboration with neurology, neurology service initiated Depakote  for both mood stabilization and impulse control management, which is an excellent medication choice for this patient. Depakote  (valproic  acid) is a mood stabilizer with anticonvulsant properties that has demonstrated efficacy in reducing impulsivity and aggression, particularly in patients with traumatic brain injury or impulse control disorders. It can help stabilize mood fluctuations and reduce impulsive decision-making that has contributed to patient's repeated suicide attempts in response to acute stressors.  Chart review reveals that during this hospitalization, patient was started on Seroquel , Ambien , and Zyprexa . Psychiatry team in coordination with the medical team has discontinued Seroquel  and Ambien  due to patient's increased fall risk and incident that occurred.  Psychiatry will plan to also titrate down patient's Zyprexa  gradually. However, it is important to safely titrate this medication slowly to avoid abrupt discontinuation, which could precipitate withdrawal symptoms or acute psychiatric decompensation. Gradual tapering allows monitoring for return of psychiatric symptoms and ensures patient remains stable as medication is reduced.  Neurology has recommended 48-hour observation period for patient. Psychiatry will remain actively involved and will continue rounding on patient daily while patient is medically hospitalized to monitor psychiatric status, assess response to Depakote  initiation, monitor tolerance of medication changes, and coordinate ongoing care with neurology and primary medical team.    08/07/2024: Patient was assessed by psychiatry today. Patient denied current suicidal or homicidal ideation. He  denied auditory or visual hallucinations. Patient continues to acknowledge that his initial overdose  was an intentional suicide attempt following an argument with his family. However, despite denying current suicidal ideation, patient remains at high risk for self-harm at this time. Patient's recent intentional overdose, combined with apparent impulse control issues as evidenced by making a suicide attempt in response to an acute interpersonal conflict with family, demonstrates a pattern of poor coping and impulsive dangerous behaviors when faced with stressors. The short timeframe between the argument and suicide attempt suggests patient has difficulty regulating emotions and implementing alternative coping strategies during moments of distress, which places him at continued elevated risk for future impulsive self-harm if he encounters similar triggers or stressors.Recommendation continues for inpatient psychiatric admission following medical clearance. On current mental status examination, there was no objective evidence of mania, psychosis, or patient responding to internal stimuli.Patient was updated on the treatment plan and recommendation for inpatient psychiatric admission. Patient verbalized understanding of the care plan at this time.    08/06/2024: Patient was seen today during psychiatric consultation rounds. Patient was alert and oriented X 3, calm, cooperative, and engaged in the interview. Patient was drowsy and lethargic during the conversation. He reports that he feels tired and weak and just prefers to sleep. Per clinical staff, patient has had trials of ambulating to assess his ability to walk and maintain his balance. Unfortunately, patient has not had the ability to walk and balance himself independently. Patient remains extremely tremulous with movement per the clinical medical team. Patient reports his appetite is gradually returning. He continues to endorse feeling depressed, but he  denies suicidal ideations. Patient reports having some anxiety as well which he rates both 2 out of 10. Patient is observed minimizing his symptoms today. Patient also denies homicidal ideations and perceptual disturbances. Patient remains on 1:1 observation per protocol and has a sister at bedside. Will continue to provide consultation until he transitions to the inpatient unit.   Patient is recommended for inpatient psychiatric admission following medical clearance based on significant clinical concerns. Patient presents with active and repeated suicide attempts, including the current presentation for intentional overdose on Benadryl  and possibly Wellbutrin, occurring shortly after a recent intentional Tylenol  overdose approximately one week ago on July 27, 2024. This pattern of repeated suicide attempts within a short timeframe represents imminent and ongoing danger to self requiring intensive psychiatric intervention in a secure, monitored environment.  Patient has extensive psychiatric history including major depressive disorder, PTSD, likely borderline personality disorder, psychogenic nonepileptic seizures, and documented history of multiple suicide attempts over time. Patient's mental health conditions are currently untreated, as he has no established psychiatrist or medication management provider. Patient has only attended one therapy session at [facility], indicating he is in the very early stages of establishing outpatient mental health support and does not yet have adequate therapeutic relationship or coping skills developed to maintain safety in the community.  Patient identifies family conflict as a major trigger for his suicidal behavior and describes living in a chronically stressful home environment with mother, father, and sister, with whom he reports strained relationships and feelings of being less valued than his sister. Patient's living situation appears to be a  significant ongoing stressor that contributes to his suicidal ideation and behavior. Patient explicitly does not want family involved in his care at this time.  Patient has limited treatment response history, having tried multiple medication trials (Prozac , Abilify  which worsened suicidal thoughts) and ECT treatment approximately 2 years ago that provided only temporary relief for a couple of months before depression returned.  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  Patient is currently unemployed due to mental health factors, which may contribute to feelings of worthlessness, hopelessness, and lack of purpose that can worsen depression and increase suicide risk. Patient has minimal outpatient support system established and no current psychiatric medication management, leaving him without adequate safety net or treatment resources in the community.  Inpatient psychiatric admission will provide safe, secure environment for suicide prevention and intensive monitoring during this high-risk period with pattern of repeated attempts, comprehensive psychiatric evaluation and diagnostic clarification, including assessment for borderline personality disorder given chart documentation and pattern of repeated suicide attempts in context of interpersonal stressors, medication initiation and optimization for treatment-resistant depression, including consideration of medication combinations or novel approaches, establishment of intensive individual therapy to address underlying depression, PTSD, family conflict, and maladaptive coping patterns, dialectical behavior therapy (DBT) or other evidence-based interventions if borderline personality disorder is confirmed, to address emotion dysregulation and self-harm behaviors.  The recommendation for inpatient psychiatric admission following  medical clearance was discussed with both patient and the primary medical team. Patient appeared to understand the recommendation and rationale for this level of care. Patient will remain medically hospitalized until cleared by the primary medical team, at which time he will be transferred to an inpatient psychiatric facility for continued evaluation, stabilization, and comprehensive treatment of his treatment-resistant depression, repeated suicide attempts, and complex psychiatric needs.     Diagnoses:  Active Hospital problems: Principal Problem:   Suicidal overdose, initial encounter [facility]) Active Problems:   SSRI overdose, intentional self-harm, initial encounter (HCC)   Intentional SSRI (selective serotonin reuptake inhibitor) overdose (HCC)    Plan   ## Psychiatric Medication Recommendations:  Discontinued seroquel  and ambien  at this time  ## Medical Decision Making Capacity: Not specifically addressed in this encounter  ## Further Work-up:   -- most recent EKG on 08/03/2024 had QtC of 457 -- Pertinent labwork reviewed earlier this admission includes: BMP, CBC, TSH, CMP, acetaminophen  level, ethanol, salicylate level, CBC   ## Disposition:-- Patient is under IVC and recommended for inpatient psychiatric admission after medical stabilization  ## Behavioral / Environmental: - No specific recommendations at this time.     ## Safety and Observation Level:  - Based on my clinical evaluation, I estimate the patient to be at moderate risk of self harm in the current setting. - At this time, we recommend  1:1 Observation. This decision is based on my review of the chart including patient's history and current presentation, interview of the patient, mental status examination, and consideration of suicide risk including evaluating suicidal ideation, plan, intent, suicidal or self-harm behaviors, risk factors, and protective factors. This judgment is based on our ability to directly address  suicide risk, implement suicide prevention strategies, and develop a safety plan while the patient is in the clinical setting. Please contact our team if there is a concern that risk level has changed.  CSSR Risk Category:C-SSRS RISK CATEGORY: High Risk  Suicide Risk Assessment: Patient has following modifiable risk factors for suicide: lack of access to outpatient mental health resources, current symptoms: anxiety/panic, insomnia, impulsivity, anhedonia, hopelessness, triggering events, and recent psychiatric hospitalization, which we are addressing by recommending inpatient psychiatric admission for further monitoring and stabilization. Patient has following non-modifiable or demographic risk factors for suicide: male gender, history of suicide attempt, and psychiatric hospitalization Patient has the following protective factors against suicide: Access to outpatient mental health care  Thank you  for this consult request. Recommendations have been communicated to the primary team.  We will continue to round while medically admitted at this time.   Zelda Sharps, NP         History of Present Illness  Relevant Aspects of [facility]   Patient Report:  Patient was assessed by psychiatry today. Patient endorsed intentionally overdosing on medications in an attempt to harm himself due to family conflict. Patient reported he knows he intentionally took Benadryl  and stated he may have also taken Wellbutrin along with this, but reported he was honestly unsure about the Wellbutrin. Of note, chart review reveals patient has a recent intentional overdose on Tylenol  documented around July 27, 2024 of this year. Patient endorsed multiple suicide attempts in the past.  Patient reported that family is a major trigger for him and stated they know how to trigger me. Patient expressed he does not want family involved in his care currently. Patient reported he lives at home with his mother, father,  and sister. He referred to his sister as the golden child and endorsed that relationships with all of his family members are strained, suggesting feelings of being less favored or valued within the family dynamic.  Patient reported he currently sees a therapist through [facility] but has only attended 1 session thus far. However, patient stated he does not see a psychiatrist or medication management provider at this time. Patient reported his Wellbutrin prescription is from a long time ago and that PCPs have just continued the medication. Patient reported in the past he has trialed Prozac  and Abilify , with Abilify  reportedly worsening his suicidal thoughts. Patient also reported completing 12 sessions of electroconvulsive therapy (ECT) approximately 2 years ago, which he stated helped for a couple of months, but his depression returned after that period.  Patient endorsed previous mental health history of major depressive disorder and post-traumatic stress disorder. Through chart review, there was mention of potential borderline personality disorder. However, patient did not list this diagnosis during today's assessment. Patient endorsed history of seizures but reported his last known seizure was a couple of years ago. Chart review of recent admission notes documented through [facility] lists patient's history as including borderline personality disorder, psychogenic nonepileptic seizures, major depressive disorder, PTSD, and history of multiple suicide attempts.  Patient endorsed rare alcohol use. He denied any other recreational drug use or tobacco use. Patient reported previous inpatient psychiatric admissions, with the most recent being what he described as last week when he intentionally overdosed on Tylenol . Patient denied any access to firearms or weapons and denied any upcoming known legal charges. Patient reported he is not currently employed, as he had  to leave the workforce due to many factors including his mental health struggles.  Psych ROS:  Depression: Endorsed Anxiety: Endorsed Mania (lifetime and current): Denied Psychosis: (lifetime and current): Denied  Collateral information:  Patient declined to provide contact for collateral information at this time    Psychiatric and Social History  Psychiatric History:  Information collected from patient/chart review  Prev Dx/Sx: MDD, borderline personality disorder, PTSD, multiple previous suicide attempts Current Psych Provider: Denied Home Meds (current): Wellbutrin Previous Med Trials: Abilify , Prozac  Therapy: Yes  Prior Psych Hospitalization: Yes Prior Self Harm: Yes Prior Violence: Denied  Family Psych History: Reported brother has history of bipolar disorder Family Hx suicide: Denied  Social History:   Educational Hx: Some college 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 Hx: Denied Living Situation: With mother,  father and sister  Access to weapons/lethal means: Denied access to firearms  Substance History Alcohol: Endorsed occasional use History of alcohol withdrawal seizures denied History of DT's denied Tobacco: Denied Illicit drugs: Denied Prescription drug abuse: Denied Rehab hx: Denied  Exam Findings  Physical Exam: Reviewed and agree with the physical exam findings conducted by the medical provider Vital Signs:  Temp:  [97.9 F (36.6 C)-98.7 F (37.1 C)] 98.6 F (37 C) (01/13 0800) Pulse Rate:  [77-138] 84 (01/13 1030) Resp:  [17-28] 19 (01/13 1030) BP: (101-143)/(54-84) 125/83 (01/13 1030) SpO2:  [94 %-99 %] 98 % (01/13 1030) Weight:  [109.6 kg] 109.6 kg (01/13 0325) Blood pressure 125/83, pulse 84, temperature 98.6 F (37 C), temperature source Oral, resp. rate 19, height 6' 3 (1.905 m), weight 109.6 kg, SpO2 98%. Body mass index is 30.2 kg/m.    Mental Status Exam: General Appearance: Fairly Groomed  Orientation:  Full (Time,  Place, and Person)  Memory:  Fair  Concentration:  Concentration: Fair  Recall:  Fair  Attention  Fair  Eye Contact:  Minimal  Speech:  Clear and Coherent  Language:  Fair  Volume:  Normal  Mood: Depressed  Affect:  Depressed  Thought Process:  Coherent and Linear  Thought Content:  Logical  Suicidal Thoughts:  Yes.  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  Family History:  The patient's family history includes Healthy in his mother.  Medical History: Past Medical History:  Diagnosis Date   Anxiety    Depression    Major depressive disorder    Seizures (HCC)     Surgical History: Past Surgical History:  Procedure Laterality Date   NO PAST SURGERIES       Medications:  Current Medications[1]  Allergies: Allergies[2]  Zelda Sharps, NP This note was created using Dragon dictation software. Please excuse any inadvertent transcription errors. Case was discussed with supervising physician Dr. Jadapalle who is agreeable with current plan.         [1]  Current Facility-Administered Medications:    acetaminophen  (TYLENOL ) tablet 650 mg, 650 mg, Oral, Q6H PRN, 650 mg at 08/08/24 0946 **OR** acetaminophen  (TYLENOL ) suppository 650 mg, 650 mg, Rectal, Q6H PRN, Laurita Manor T, MD   Chlorhexidine  Gluconate Cloth 2 % PADS 6 each, 6 each, Topical, Daily, Sreenath, Sudheer B, MD   divalproex  (DEPAKOTE  SPRINKLE) capsule 500 mg, 500 mg, Oral, Q12H, Matthews Elida HERO, MD   enoxaparin  (LOVENOX ) injection 40 mg, 40 mg, Subcutaneous, Q24H, Zhang,  Ping T, MD, 40 mg at 08/07/24 2139   FLUoxetine  (PROZAC ) capsule 20 mg, 20 mg, Oral, Daily, Siadecki, Sebastian, MD, 20 mg at 08/08/24 0946   hydrOXYzine  (ATARAX ) tablet 25 mg, 25 mg, Oral, Q8H PRN, Zhang, Ping T, MD   LORazepam  (ATIVAN ) injection 4 mg, 4 mg, Intravenous, Once, Donati-Garmon, Natalie M, NP   metoCLOPramide  (REGLAN ) injection 10 mg, 10 mg, Intravenous, Q6H PRN, Zhang, Ping T, MD, 10 mg at 08/06/24 1846   OLANZapine  (ZYPREXA ) tablet 10 mg,  10 mg, Oral, QHS, Laurita Manor T, MD, 10 mg at 08/07/24 2140   Oral care mouth rinse, 15 mL, Mouth Rinse, PRN, Jhonny, Sudheer B, MD   QUEtiapine  (SEROQUEL ) tablet 100 mg, 100 mg, Oral, QHS, Zhang, Ping T, MD, 100 mg at 08/07/24 2140   senna-docusate (Senokot-S) tablet 1 tablet, 1 tablet, Oral, QHS PRN, Laurita Manor DASEN, MD   tamsulosin  (FLOMAX ) capsule 0.4 mg, 0.4 mg, Oral, Daily, Jhonny, Sudheer B, MD, 0.4 mg at 08/07/24 1925   zolpidem  (AMBIEN ) tablet 5 mg, 5 mg, Oral, QHS PRN,MR X 1, Zhang, Ping T, MD [2]  Allergies Allergen Reactions   Bee Venom Anaphylaxis   Gabapentin  Other (See Comments) and Anaphylaxis    Muscle twitching   Haldol [Haloperidol] Anaphylaxis and Other (See Comments)    Per father, dystonic reaction with  muscle contractures- also   Hornet Venom Anaphylaxis and Itching   Peanut-Containing Drug Products Anaphylaxis   Wasp Venom Anaphylaxis   Beeswax Swelling

## 2024-08-08 NOTE — Significant Event (Addendum)
 Rapid Response Event Note   Reason for Call :  Fall  Initial Focused Assessment:  Patient minimally responsive on floor of restroom.  Patient asisted by staff  into wheelchair and then into bed. Patient BP 143/84 HR 135 O2 97 on 4L. Patient  Tremors in Left arm and Left eye gazed. CBG normal. 4mg  of ativan  given and then patient started mumbling and moving extremities. Patient transferred to [facility] unit.  Natatalie Donati-Gorman NP at bedside throughout rapid and transfer to [facility] unit.      Interventions:  -Ativan  -EKG -CT head   Plan of Care:     Event Summary:   MD Notified: NP Donati-Gorman Call [phone] Arrival [phone] End [id number]  Lesley LOISE Shams, RN

## 2024-08-08 NOTE — TOC Progression Note (Signed)
 Transition of Care [facility]) - Progression Note    Patient Details  Name: Vincent Frank MRN: [id number] Date of Birth: Dec 12, 2000  Transition of Care [facility]) CM/SW Contact  Corean ONEIDA Haddock, RN Phone Number: 08/08/2024, 9:46 AM  Clinical Narrative:      Spoke with with Darina at [place]. She states they will hold the bed once work up completed  she requested to be called at [phone] by the [facility] team                    Expected Discharge Plan and Services         Expected Discharge Date: 08/07/24                                     Social Drivers of Health (SDOH) Interventions SDOH Screenings   Food Insecurity: No Food Insecurity (08/03/2024)  Recent Concern: Food Insecurity - Food Insecurity Present (07/28/2024)   Received from [facility]  Housing: Unknown (08/03/2024)  Recent Concern: Housing - High Risk (07/28/2024)   Received from [facility]  Transportation Needs: No Transportation Needs (08/03/2024)  Recent Concern: Transportation Needs - Unmet Transportation Needs (07/13/2024)   Received from [facility]  Utilities: Not At Risk (08/03/2024)  Recent Concern: Utilities - High Risk (07/13/2024)   Received from [facility]  Alcohol Screen: Low Risk (06/03/2023)  Financial Resource Strain: Medium Risk (07/13/2024)   Received from [facility]  Physical Activity: Sufficiently Active (07/13/2024)   Received from [facility]  Social Connections: Patient Declined (08/03/2024)  Recent Concern: Social Connections - Moderately Isolated (07/13/2024)   Received from [facility]  Stress: Stress Concern Present (07/28/2024)   Received from [facility]  Tobacco Use: Low Risk (08/02/2024)  Health Literacy: Low Risk (07/13/2024)   Received from [facility]    Readmission Risk Interventions     No data to display

## 2024-08-08 NOTE — Progress Notes (Signed)
 "       Overnight   NAME: Vincent Frank MRN: [id number] DOB : 06-07-2001    Date of Service   08/08/2024   HPI/Events of Note   HPI: 24 year old male with past medical history significant for MDD, seizure disorder, presented with suicidal ideation and suicide attempt with Wellbutrin and Benadryl  overdose.  Patient reported feelin a lot of stress in life and took many pills of Wellbutrin and Benadryl .  Estimated time of ingestion was around 1700 on 1/8.  On arrival to the emergency room patient was lethargic but oriented x 3.  Plan was for patient to be discharged to inpatient behavioral health unit tomorrow.   Overnight: Rapid response called nurse tech assisted patient to restroom.  Nurse tech reports patient started to complain of dizziness and lightheadedness.  Nurse tech reports patient then sat down on bathroom floor.  Shortly after he sat down he became unresponsive, nurse tech reports patient hit his head against the wall when this happened.  Rapid response was called.  On arrival to room patient was sitting with assistance on the restroom floor unresponsive.  Vital signs stable.  4-5 people were attempting to get patient to wheelchair and transferred to bed.  Once patient was transferred to bed and I was able to fully examine the patient I noticed he had a left gaze deviation, tremors in his left upper extremity, and was unresponsive.  4 mg of IV Ativan  was ordered.  IV access was established EKG obtained CBG obtained.  Patient shortly after Ativan  became postictal, transferred to stepdown unit.  On arrival to stepdown unit patient more alert and oriented able to answer questions.  Patient was transferred to CT scan.  On arrival back to the unit patient endorsed a sensation charge nurse it feels like I am falling down.  Charge nurse came to tell me of patient's sensation and shortly after patient became unresponsive,  with bilateral upper extremity tremors, nystagmus noted lasting  roughly 1 minute.  An additional 4 mg of Ativan  ordered.  Shortly after Ativan  administered and patient became arousable able to answer questions, reports feeling slow/confused.  Keppra  bolus ordered, EEG ordered neurology consult ordered.    Poison control contacted given recent overdose of Wellbutrin and Benadryl .  Poison control recommended every 6 hour EKGs.  Poison  control questioned whether or not patient had access to substances, given his overdose was 5 days ago seizure activity from overdose 5 days post overdose is uncommon but not excluded in the differentials.  Patient sitter and nurse denies any concern for use of illicit substances.    Physical Exam Vitals reviewed.  Constitutional:      Appearance: He is ill-appearing.  HENT:     Mouth/Throat:     Mouth: Mucous membranes are dry.  Eyes:     Pupils: Pupils are equal, round, and reactive to light.  Cardiovascular:     Rate and Rhythm: Normal rate and regular rhythm.     Pulses:          Radial pulses are 2+ on the right side and 2+ on the left side.       Posterior tibial pulses are 2+ on the right side and 2+ on the left side.     Heart sounds: Normal heart sounds, S1 normal and S2 normal.  Pulmonary:     Effort: Pulmonary effort is normal.     Breath sounds: Normal breath sounds.  Abdominal:     General: Abdomen is  flat.  Skin:    Coloration: Skin is pale.  Neurological:     General: No focal deficit present.     GCS: GCS eye subscore is 4. GCS verbal subscore is 5. GCS motor subscore is 6.  Psychiatric:        Mood and Affect: Mood is anxious.        Speech: Speech is delayed.       Interventions/ Plan   Ativan  PRN  Keppra  loading dose EEG  Neurology consult Transfer to stepdown unit Cbc, cmp, lactic, ua  Follow up with poison control as needed (6 EKG for 24 hours)      [name] BSN RN CCRN AGACNP-BC Acute Care Nurse Practitioner [facility] [place]  "

## 2024-08-08 NOTE — Progress Notes (Signed)
 Neurology update  Patient has documented history of PNES since childhood. Feels like he is falling then blacks out. Sometimes has shaking up to and including generalized convulsions. These are not electrical seizures and should not be treated with Ativan  or AED loads.   D/w hospitalist, psychiatry, RN.  Medication changes today: - start depakote  500mg  DR BID (as mood stabilizer) - D/c Seroquel    Full consult note to follow.   Elida Ross MD [facility]

## 2024-08-08 NOTE — Progress Notes (Signed)
 Pt arrived to ICU w/ CN, transport, AC, and primary nurse from the floor. Bedside report received. Pt A&O x1, asking what happened. Pt does not remember episode. Reoriented patient. 1 IV w/ NS bolus wide open, will transition to pump upon return to from CT. Transported to CT w/ CN and NT. Pt was reportedly A&O and having conversations in CT. Upon arrival back to ICU, pt reportedly reported that he felt as if he was going to fall and started becoming lethargic. Provider notified and ordered additional 4mg  IV Ativan  and Keppra . Medications administered per [facility]. Provider at bedside. Pt mentation improving. Pt will continue to have 1:1 IVC sitter. Currently denying SI. All nonessential equipment, removed from patient's room. This RN asked patient if there was anyone he would like staff to contact - patient declined. Bed in lowest position, bed alarms on, remote within reach, and floor mats in place. Suicide and seizure precautions in place. Pt educated on how to utilize call bell on the hospital bed.

## 2024-08-09 DIAGNOSIS — T43292A Poisoning by other antidepressants, intentional self-harm, initial encounter: Secondary | ICD-10-CM | POA: Diagnosis not present

## 2024-08-09 DIAGNOSIS — T43222A Poisoning by selective serotonin reuptake inhibitors, intentional self-harm, initial encounter: Secondary | ICD-10-CM | POA: Diagnosis not present

## 2024-08-09 DIAGNOSIS — R569 Unspecified convulsions: Secondary | ICD-10-CM

## 2024-08-09 DIAGNOSIS — Z87820 Personal history of traumatic brain injury: Secondary | ICD-10-CM | POA: Diagnosis not present

## 2024-08-09 DIAGNOSIS — T450X2A Poisoning by antiallergic and antiemetic drugs, intentional self-harm, initial encounter: Secondary | ICD-10-CM

## 2024-08-09 DIAGNOSIS — T50902A Poisoning by unspecified drugs, medicaments and biological substances, intentional self-harm, initial encounter: Secondary | ICD-10-CM | POA: Diagnosis not present

## 2024-08-09 LAB — GLUCOSE, CAPILLARY: Glucose-Capillary: 103 mg/dL — ABNORMAL HIGH (ref 70–99)

## 2024-08-09 MED ORDER — VALPROATE SODIUM 100 MG/ML IV SOLN
500.0000 mg | Freq: Two times a day (BID) | INTRAVENOUS | Status: AC
  Administered 2024-08-09: 500 mg via INTRAVENOUS
  Filled 2024-08-09: qty 5

## 2024-08-09 MED ORDER — LORAZEPAM 2 MG/ML IJ SOLN: 2.0000 mg | Freq: Once | INTRAMUSCULAR | Status: AC

## 2024-08-09 MED ORDER — ENSURE PLUS HIGH PROTEIN PO LIQD
237.0000 mL | Freq: Two times a day (BID) | ORAL | Status: DC
  Administered 2024-08-10 – 2024-08-11 (×3): 237 mL via ORAL

## 2024-08-09 MED ORDER — METOPROLOL TARTRATE 5 MG/5ML IV SOLN
2.5000 mg | Freq: Once | INTRAVENOUS | Status: AC
  Administered 2024-08-09: 2.5 mg via INTRAVENOUS
  Filled 2024-08-09: qty 5

## 2024-08-09 MED ORDER — VALPROATE SODIUM 100 MG/ML IV SOLN
500.0000 mg | Freq: Two times a day (BID) | INTRAVENOUS | Status: DC
  Filled 2024-08-09: qty 5

## 2024-08-09 MED ORDER — SODIUM CHLORIDE 0.9 % IV BOLUS
500.0000 mL | Freq: Once | INTRAVENOUS | Status: AC
  Administered 2024-08-09: 500 mL via INTRAVENOUS

## 2024-08-09 MED ORDER — DIVALPROEX SODIUM 500 MG PO DR TAB
500.0000 mg | DELAYED_RELEASE_TABLET | Freq: Two times a day (BID) | ORAL | Status: DC
  Administered 2024-08-09 – 2024-08-11 (×4): 500 mg via ORAL
  Filled 2024-08-09 (×5): qty 1

## 2024-08-09 MED ORDER — OLANZAPINE 10 MG PO TABS: 10.0000 mg | ORAL_TABLET | Freq: Every day | ORAL | Status: DC

## 2024-08-09 MED ORDER — LORAZEPAM 2 MG/ML IJ SOLN: 1.0000 mg | INTRAMUSCULAR | Status: DC | PRN

## 2024-08-09 MED ORDER — VALPROATE SODIUM 100 MG/ML IV SOLN
2000.0000 mg | Freq: Once | INTRAVENOUS | Status: AC
  Administered 2024-08-09: 2000 mg via INTRAVENOUS
  Filled 2024-08-09: qty 20

## 2024-08-09 NOTE — Progress Notes (Signed)
 STROKE TEAM PROGRESS NOTE   SUBJECTIVE (INTERVAL HISTORY) His sitter is at the bedside.  Pt lying in bed, still has some lethargy but per sitter, he ate well the breakfast. Eyes open on voice, no focal deficit, some psychomotor slowing. Depakote  changed to po.    OBJECTIVE Temp:  [97.3 F (36.3 C)-97.8 F (36.6 C)] 97.8 F (36.6 C) (01/14 1017) Pulse Rate:  [56-137] 65 (01/14 1110) Cardiac Rhythm: Normal sinus rhythm (01/14 0800) Resp:  [11-36] 16 (01/14 1110) BP: (94-144)/(50-119) 113/80 (01/14 1100) SpO2:  [94 %-100 %] 96 % (01/14 1110)  Recent Labs  Lab 08/08/24 0220 08/08/24 0250 08/08/24 1021 08/08/24 2338 08/09/24 0807  GLUCAP 116* 117* 162* 106* 103*   Recent Labs  Lab 08/02/24 1847 08/03/24 0818 08/04/24 0522 08/05/24 0644 08/06/24 1907 08/08/24 0523  NA 139 139 140 140 139 141  K 3.8 4.3 4.2 3.9 4.5 4.2  CL 101 104 103 108 104 107  CO2 22 24 25 23 24 23   GLUCOSE 114* 92 100* 93 92 90  BUN 15 12 19 14 10 10   CREATININE 1.13 1.15 1.37* 1.05 1.05 1.05  CALCIUM 10.5* 10.0 9.6 9.3 9.8 9.6  MG 2.0  --  2.2  --  1.8 2.0  PHOS  --  5.7*  --   --  2.8  --    Recent Labs  Lab 08/02/24 1847 08/03/24 0818 08/05/24 0644 08/06/24 1907 08/08/24 0523  AST 24 21 18 25 26   ALT 38 31 26 35 41  ALKPHOS 98 94 89 99 88  BILITOT 0.4 0.6 0.3 <0.2 <0.2  PROT 8.5* 8.0 6.7 7.1 6.8  ALBUMIN 5.2* 4.9 4.2 4.5 4.2   Recent Labs  Lab 08/02/24 1847 08/04/24 0522 08/05/24 0644 08/06/24 1907 08/08/24 0523  WBC 7.7 7.2 6.1 7.2 6.5  NEUTROABS 5.6  --  3.2 4.6 3.4  HGB 13.5 13.3 12.0* 12.6* 12.5*  HCT 41.0 41.7 37.1* 39.0 37.9*  MCV 73.7* 76.9* 76.0* 76.8* 75.3*  PLT 287 275 248 274 218   Recent Labs  Lab 08/06/24 1907  CKTOTAL 57   No results for input(s): LABPROT, INR in the last 72 hours. Recent Labs    08/08/24 1631  COLORURINE YELLOW*  LABSPEC 1.019  PHURINE 6.0  GLUCOSEU NEGATIVE  HGBUR NEGATIVE  BILIRUBINUR NEGATIVE  KETONESUR NEGATIVE  PROTEINUR  NEGATIVE  NITRITE NEGATIVE  LEUKOCYTESUR TRACE*       Component Value Date/Time   CHOL 217 (H) 03/03/2023 0631   TRIG 133 07/21/2023 0522   HDL 39 (L) 03/03/2023 0631   CHOLHDL 5.6 03/03/2023 0631   VLDL 16 03/03/2023 0631   LDLCALC 162 (H) 03/03/2023 0631   Lab Results  Component Value Date   HGBA1C 5.2 03/03/2023      Component Value Date/Time   LABOPIA NEGATIVE 08/08/2024 1631   COCAINSCRNUR NEGATIVE 08/08/2024 1631   LABBENZ POSITIVE (A) 08/08/2024 1631   AMPHETMU NEGATIVE 08/08/2024 1631   THCU NEGATIVE 08/08/2024 1631   LABBARB NEGATIVE 08/08/2024 1631    Recent Labs  Lab 08/02/24 1847  ETH <15    I have personally reviewed the radiological images below and agree with the radiology interpretations.  Rapid EEG Result Date: 08/09/2024 Shelton Arlin KIDD, MD     08/09/2024  9:27 AM Patient Name: Vincent Frank MRN: [id number] Epilepsy Attending: Arlin KIDD Shelton Referring Physician/Provider: Lawence Madison LABOR, MD Date: 08/09/2024 Duration: 1 hour Patient history: 24yo M with seizure like episode. Rapid eeg to evaluate for  seizure Level of alertness: Asleep/lethargic AEDs during EEG study: VPA, LEV Technical aspects: This EEG was obtained using a 10 lead EEG system positioned circumferentially without any parasagittal coverage (rapid EEG). Computer selected EEG is reviewed as  well as background features and all clinically significant events. Description: EEG showed 15 to 18 Hz, 2-3 uV beta activity  distributed symmetrically and diffusely.. Hyperventilation and photic stimulation were not performed.   IMPRESSION: This study is within normal limits. No seizures or epileptiform discharges were seen throughout the recording. If suspicion for interictal activity remains a concern, a conventional eeg with full montage and video can be considered. Priyanka MALVA Krebs   CT HEAD WO CONTRAST ( ) Result Date: 08/08/2024 CLINICAL DATA:  Shell evaluation for seizure disorder, clinical  change. EXAM: CT HEAD WITHOUT CONTRAST TECHNIQUE: Contiguous axial images were obtained from the base of the skull through the vertex without intravenous contrast. RADIATION DOSE REDUCTION: This exam was performed according to the departmental dose-optimization program which includes automated exposure control, adjustment of the mA and/or kV according to patient size and/or use of iterative reconstruction technique. COMPARISON:  CT from 08/06/2024. FINDINGS: Brain: Cerebral volume within normal limits. No acute intracranial hemorrhage. No acute large vessel territory infarct. No mass lesion or midline shift. No hydrocephalus or extra-axial fluid collection. Vascular: No abnormal hyperdense vessel. Skull: Scalp soft tissues demonstrate no acute finding. Calvarium intact. Sinuses/Orbits: Globes orbital soft tissues within normal limits. Paranasal sinuses and mastoid air cells are clear. Other: None. IMPRESSION: Stable head CT.  No acute intracranial abnormality. Electronically Signed   By: Morene Hoard M.D.   On: 08/08/2024 05:46   CT HEAD WO CONTRAST ( ) Result Date: 08/06/2024 EXAM: CT HEAD WITHOUT CONTRAST 08/06/2024 09:36:35 PM TECHNIQUE: CT of the head was performed without the administration of intravenous contrast. Automated exposure control, iterative reconstruction, and/or weight based adjustment of the mA/kV was utilized to reduce the radiation dose to as low as reasonably achievable. COMPARISON: 05/31/2024 CLINICAL HISTORY: Mental status change, unknown cause. FINDINGS: BRAIN AND VENTRICLES: No acute hemorrhage. No evidence of acute infarct. No hydrocephalus. No extra-axial collection. No mass effect or midline shift. ORBITS: No acute abnormality. SINUSES: No acute abnormality. SOFT TISSUES AND SKULL: No acute soft tissue abnormality. No skull fracture. IMPRESSION: 1. No acute intracranial abnormality. Electronically signed by: [name] MD 08/06/2024 09:53 PM EST RP Workstation:  [id number]     PHYSICAL EXAM  Temp:  [97.3 F (36.3 C)-97.8 F (36.6 C)] 97.8 F (36.6 C) (01/14 1017) Pulse Rate:  [56-137] 65 (01/14 1110) Resp:  [11-36] 16 (01/14 1110) BP: (94-144)/(50-119) 113/80 (01/14 1100) SpO2:  [94 %-100 %] 96 % (01/14 1110)  General - Well nourished, well developed, in no apparent distress, mildly lethargic.  Ophthalmologic - fundi not visualized due to noncooperation.  Cardiovascular - Regular rhythm and rate.  Mental Status -  Level of arousal and orientation to time, place, and person were intact, but psychomotor slowing, answer questions in delayed fashion. Language including expression, naming, repetition, comprehension was assessed and found intact. Fund of Knowledge was assessed and was intact.  Cranial Nerves II - XII - II - Visual field intact OU. III, IV, VI - Extraocular movements intact. V - Facial sensation intact bilaterally. VII - Facial movement intact bilaterally. VIII - Hearing & vestibular intact bilaterally. X - Palate elevates symmetrically. XI - Chin turning & shoulder shrug intact bilaterally. XII - Tongue protrusion intact.  Motor Strength - The patients strength was normal in all extremities  and pronator drift was absent.  Bulk was normal and fasciculations were absent.   Motor Tone - Muscle tone was assessed at the neck and appendages and was normal.  Reflexes - The patients reflexes were symmetrical in all extremities and he had no pathological reflexes.  Sensory - Light touch, temperature/pinprick were assessed and were symmetrical.    Coordination - The patient had normal movements in the hands and feet with no ataxia or dysmetria.  Tremor was absent.  Gait and Station - deferred.   ASSESSMENT/PLAN Vincent Frank is a 24 y.o. 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 However, he was able to tell the episode to Dr. Matthews, neurologist. CT and EEG negative. Current episode deemed to be another episode of PNES. Keppra  d/c'ed and started on depakote . Now depakote  changed to po.   PLAN: - no further neuro work up needed - continue depakote , changed to po - continue other psych meds and home meds per primary and psychiatry. - medically cleared from neuro standpoint for [facility] placement - Neurology will sign off. Please call with questions.   Hospital day # 4  Thanks for the consult.  Ary Cummins, MD PhD Stroke Neurology 08/09/2024 12:32 PM    To contact Stroke Continuity provider, please refer to [email or web address]. After hours, contact General Neurology

## 2024-08-09 NOTE — Consult Note (Signed)
 [facility] TeleNeurology Consult Services  Stat Consult  Patient Name:   Vincent Frank, Vincent Frank Date of Birth:   01-05-2001 Identification Number:   MRN - [id number] Date of Service:   08/09/2024 00:22:00  Diagnosis:       G93.41 - Encephalopathy Metabolic  Impression 24 yo M who presents with seizure like activity. CT head personally reviewed and does not show hemorrhage or definitive acute developing ischemic stroke (done yesterday). Originally admitted with benadryl /Wellbutrin overdose and patient reportedly with long-standing diagnosis of PNES. Plan from neurology consult note from earlier today was to start depakote  and avoid ativan .  Ativan  given prior to my arrival on camera for seizure like activity this evening. Continues overall unresponsive at this time although vitals/respiratory status stable. Unclear whether event this morning and this evening are definitively in line with his hx of PNES, especially if setting of potential overdose of benadryl /Wellbutrin which is known to lower seizure threshold. Would still try to avoid escalating AEDs for questionable events but if there is high clinical concern for seizure activity, would need to treat at that time as is felt appropriate (please contact our service back stat as we may be able to assist in differentiating at that time).  For now would place on Ceribell monitoring, give depacon  load of 2 grams IV (and continue maintenance dosing already in place). Would try to avoid further doses of ativan  for now as no clinical seizure activity noted at this time. Obtain full EEG in am and if further clinical seizure activity arises, may need to transfer to facility that has full continuous EEG monitoring capabilities at that time. Continue in ICU with q1h neurochecks at this time as well.  Neurology will need to continue to follow.   Dispositions : Neurology will  follow    ----------------------------------------------------------------------------------------------------    Metrics: Callback Response Time: 08/09/2024 00:24:13  Primary Provider Notified of Diagnostic Impression and Management Plan on: 08/09/2024 01:01:00   CT HEAD: CT head unremarkable for acute infarction or hemorrhage per Radiology: CT head personally reviewed and does not show hemorrhage or definitive acute developing ischemic stroke (done yesterday).    ----------------------------------------------------------------------------------------------------  Chief Complaint: seizure like activity  History of Present Illness: Patient is a 24 year old Male. 24 yo M who presents with seizure like activity.  Per admission H&P from 08/03/24: 24 y.o. male with medical history significant of MDD, seizure disorder, presented with suicidal ideation and suicidal attempt with Wellbutrin and Benadryl  overdose. Patient reportedly with seizure like activity on 1/13. Neurology consulted and states patient has long-standing hx of PNES and not to treat seizure like events with ativan /etc. Case discussed between neurology and psychiatry and plan was to start depakote  500 mg bid.  Reportedly (from bedside MD), patient had seizure like activity this morning. Was given 4 mg ativan  and loaded with keppra  reportedly. Was transferred to ICU at that time. About 30-60 minutes ago he started having seizure like activity again (upward gaze, blinking, twitching of lips and totally unresponsive). He has been unresponsive overall since then. Was given 2 mg ativan  tonight. Was given 2 doses of depakote  today (500 mg) but no load.  Currently not talking or following commands. Vitals stable and protecting airway per Dr. Lawence, not currently intubated.      Past Medical History:      Seizures Other PMH:  major depression per chart review, per neurology note has hx of PNES (psychogenic non-epileptic  seizures/spells)  Medications:  No Anticoagulant use  No Antiplatelet use Reviewed  EMR for current medications  Allergies:  Reviewed  Social History: Unable To Obtain Due To Patient Status : Patient Is Obtunded/ Comatose  Family History:  Family History Cannot Be Obtained Because:Patient Is Obtunded/ Comatose  ROS : ROS Cannot Be Obtained Because:  Patient Is Obtunded/ Comatose      Examination: BP(132/77), Pulse(130), Blood Glucose(106) 1A: Level of Consciousness - Movements to Pain + 2 1B: Ask Month and Age - Could Not Answer Either Question Correctly + 2 1C: Blink Eyes & Squeeze Hands - Performs 0 Tasks + 2 2: Test Horizontal Extraocular Movements - Normal + 0 3: Test Visual Fields - Bilateral Hemianopia + 3 4: Test Facial Palsy (Use Grimace if Obtunded) - Normal symmetry + 0 5A: Test Left Arm Motor Drift - No Effort Against Gravity + 3 5B: Test Right Arm Motor Drift - No Effort Against Gravity + 3 6A: Test Left Leg Motor Drift - No Movement + 4 6B: Test Right Leg Motor Drift - No Movement + 4 7: Test Limb Ataxia (FNF/Heel-Shin) - Does Not Understand + 0 8: Test Sensation - Complete Loss: Cannot Sense Being Touched At All + 2 9: Test Language/Aphasia - Mute/Global Aphasia: No Usable Speech/Auditory Comprehension + 3 10: Test Dysarthria - Mute/Anarthric + 2 11: Test Extinction/Inattention - No abnormality + 0  NIHSS Score: 30 NIHSS Free Text : does not talk or follow commands currently; moves arms some spontaneously but did not see legs move definitively on my exam; does not respond to noxious stimuli in arms/legs; does not blink to threat; sluggish OCR rxn  Spoke with : Dr. Lawence at bedside    This consult was conducted in real time using interactive audio and immunologist. Patient was informed of the technology being used for this visit and agreed to proceed. Patient located in hospital and provider located at home/office setting.   Patient is being evaluated  for possible acute neurologic impairment and high probability of imminent or life - threatening deterioration.I spent total of 35 minutes providing care to this patient, including time for face to face visit via telemedicine, review of medical records, imaging studies and discussion of findings with providers, the patient and / or family.   Dr Garnette Medicine     [facility] For Inpatient follow-up with [facility] physician please call [facility] at [phone]. As we are not an outpatient service for any post hospital discharge needs please contact the hospital for assistance.  If you have any questions for the [facility] physicians or need to reconsult for clinical or diagnostic changes please contact us  via [facility] at [phone].  Non-radiologist review of imaging performed to assist with emergent clinical decision-making. Remote physician workstations do not possess the same resolution, calibration, or diagnostic capabilities as hospital-based radiology reading stations, and formal radiologist read is necessary.   Signature : Garnette Medicine

## 2024-08-09 NOTE — Procedures (Addendum)
 Patient Name: Vincent Frank  MRN: [id number]  Epilepsy Attending: Arlin MALVA Krebs  Referring Physician/Provider: Lawence Madison LABOR, MD  Date: 08/09/2024 Duration: 1 hour   Patient history: 24yo M with seizure like episode. Rapid eeg to evaluate for seizure  Level of alertness: Asleep/lethargic  AEDs during EEG study: VPA, LEV  Technical aspects: This EEG was obtained using a 10 lead EEG system positioned circumferentially without any parasagittal coverage (rapid EEG). Computer selected EEG is reviewed as  well as background features and all clinically significant events.  Description: EEG showed 15 to 18 Hz, 2-3 uV beta activity  distributed symmetrically and diffusely.. Hyperventilation and photic stimulation were not performed.     IMPRESSION: This study is within normal limits. No seizures or epileptiform discharges were seen throughout the recording.  If suspicion for interictal activity remains a concern, a conventional eeg with full montage and video can be considered.   Abubakr Wieman O Venise Ellingwood

## 2024-08-09 NOTE — Plan of Care (Signed)
   Problem: Clinical Measurements: Goal: Cardiovascular complication will be avoided Outcome: Progressing   Problem: Activity: Goal: Risk for activity intolerance will decrease Outcome: Progressing   Problem: Safety: Goal: Ability to remain free from injury will improve Outcome: Progressing

## 2024-08-09 NOTE — BH Assessment (Signed)
 PT  PLACED UNDER  2 ND  SET OF  IVC PAPERS  INFORMED  COREY LEE  RN

## 2024-08-09 NOTE — Progress Notes (Signed)
 2340 Patient Unresponsive Upward gaze with eyes. Patient presenting similarly to how he  did in the early morning when a Rapid was called. Patient HR 120s BP 144/79 Patient O2 Briefly dropped to 82. Dr Lawence Paged. Verbal conversation regarding patients and past history and current presentation. Ativan  and Keppra  but Dr Lawence decided against due to Neurology note from this afternoon regarding avoiding use of Ativan  and AED.  Dr Lawence wanted tele Neurology to be consulted. IV Depacon  ordered. Tele Neuro cart retrieved from CT.   0015 Tele Neuro Called for consult awaiting TeleNeuro Doctor.  MD Omar Dover also notified by DR [facility]  about patient while waiting on teleNeuro. Both Drs Made aware that Patients GSC was less then 8. O2 stats currently 97s on RM but patient is currently unresponsive. Patient intermittent with upwards Gaze. Does not respond to any Painful stimulus   0046 Dr Lawence at Bedside 2MG  of ativan  Ordered and given While waiting on TeleNeuro.

## 2024-08-09 NOTE — Progress Notes (Signed)
"  ° °      SIGNIFICANT EVENT NOTE  Includes rapid response, CODE BLUE, acute worsening of clinical condition, need for bedside evaluation/urgent imaging, labs, consults, therapeutics  Event: Rapid response  NAME: Vincent Frank MRN: [id number] DOB : [birth year]    Reason for event   Found on bathroom floor unresponsive     Past medical history context -24 year old with multiple prior suicidal attempts admitted for Wellbutrin and Benadryl  overdose.  -Episodes of seizure-like activity as well as unresponsiveness when he was found on the floor of the room -Evaluated by neurology with negative EEG and negative Cerebyl monitoring. -Transferred from ICU to medical floor on 08/09/2024 -Currently awaiting inpatient psych admission  Sequence of events -Per sitter at bedside: Patient was speaking with her normally and then he went into the bathroom with the door closed. -Patient then found unresponsive lying supine on the bathroom floor -CODE BLUE announced overhead -CODE BLUE was subsequently canceled as patient's vitals were all normal  Initial patient rapid assessment -Upon my arrival, rapid response team around patient lying on bathroom floor -C-spine stabilized and patient lifted to bed -Vitals remaining WNL -Patient appears to be resting however not flinching or responding to painful stimuli     08/09/2024   11:44 PM 08/09/2024   11:39 PM 08/09/2024    9:49 PM  Vitals with BMI  Systolic 137 137 893  Diastolic 77 91 79  Pulse 100 125 99  O2 sat at 100% on room air   Physical Exam Constitutional:      Comments: Unresponsive with GCS 3, no response to painful stimuli  Cardiovascular:     Rate and Rhythm: Normal rate and regular rhythm.  Pulmonary:     Effort: Pulmonary effort is normal.     Breath sounds: Normal breath sounds.  Neurological:     General: No focal deficit present.     Mental Status: He is unresponsive.     GCS: GCS eye subscore is 1. GCS verbal subscore  is 1. GCS motor subscore is 1.      Initial diagnostic considerations/ plan/interventions   Diagnostic Workup CT head and C-spine nonacute  Response to interventions Upon reevaluation, patient still minimally responding but will occasionally lift left arm and open eyes spontaneously Vitals remained within normal limits  Final assessment     Assessment:  Episode of unresponsiveness, recurrent, etiology unclear Found on bathroom floor-no apparent injury NPES suspected  Plan: Neurologic checks Strict 1:1 precautions.  Bathroom door to be kept open X X       CRITICAL CARE Performed by: Delayne LULLA Solian   Total critical care time: 60 minutes  Critical care time was exclusive of separately billable procedures and treating other patients.  Critical care was necessary to treat or prevent imminent or life-threatening deterioration.  Critical care was time spent personally by me on the following activities: development of treatment plan with patient and/or surrogate as well as nursing, discussions with consultants, evaluation of patient's response to treatment, examination of patient, obtaining history from patient or surrogate, ordering and performing treatments and interventions, ordering and review of laboratory studies, ordering and review of radiographic studies, pulse oximetry and re-evaluation of patient's condition.         "

## 2024-08-09 NOTE — Progress Notes (Addendum)
" ° °  0000: Upon entering room patient is unresponsive. No response to pain or sternal rub. Pt noted to have L upper gaze deviation. Dr. Lawence notified.   9953: 2mg  of ativan  administered. Pt remains unresponsive. Dr. Lawence at bedside. Ceribell placed onto pt at this time. New order for Depacon  received, awaiting medication from pharmacy.   0140: Depacon  administered at this time.   0220: Patient is more alert and following commands at this time. Currently only oriented to self. Pt has taken off ceribell and  states that it's uncomfortable and wants to be left alone at this time. Dr.Mansy and Dr. Michaela made aware.  "

## 2024-08-09 NOTE — Consult Note (Signed)
 [facility] Consult Follow Up  Patient Name: .Vincent Frank  MRN: [id number]  DOB: 24-Oct-2000  Consult Order details:  Orders (From admission, onward)     Start     Ordered   08/02/24 1841  CONSULT TO CALL ACT TEAM       Ordering Provider: Jacolyn Pae, MD  Provider:  (Not yet assigned)  Question:  Reason for Consult?  Answer:  Psych consult   08/02/24 1841   08/02/24 1841  IP CONSULT TO PSYCHIATRY       Ordering Provider: Jacolyn Pae, MD  Provider:  (Not yet assigned)  Question Answer Comment  Reason for consult: Other (see comments)   Comments: Intentional overdose      08/02/24 1841             Mode of Visit: In person    Psychiatry Consult Evaluation  Service Date: August 09, 2024 LOS:  LOS: 4 days  Chief Complaint Intentional overdose  Primary Psychiatric Diagnoses  Suicidal overdose   Assessment   08/10/2024: Patient was seen on rounds today by psychiatry. Patient was noted to be more alert this morning and responding appropriately to this provider, though still endorsing feeling very tired. Patient denied current suicidal or homicidal ideation as well as auditory or visual hallucinations.  Discontinuation of Zyprexa , Seroquel  and PRN Ambien  (started by other providers during patient hospitalization) due to increased sedation and rapid response event was discussed with patient, who verbalized understanding. Patient also verbalized understanding of continuing Depakote  therapy per neurology recommendations and ongoing recommendation for inpatient psychiatric admission. This provider asked if patient wanted family members notified or contacted at this point in his hospitalization, to which patient continued to decline. Patient remains calm and cooperative with the psychiatry team.  Per social work, patient has been accepted to an inpatient psychiatric facility. However, facility has requested additional blood work and medical  records be faxed demonstrating medical clearance before accepting transfer. Plan is for patient to proceed with transfer to inpatient psychiatric facility once primary medical team provides medical clearance and required documentation. Psychiatry will continue to be available while patient completes medical treatment.    [id number]: Patient was seen on rounds today by psychiatry. Patient continued to be lethargic and was not able to participate in assessment today. Due to patient's recent rapid response event and significantly increased lethargy, psychiatry has made the decision to discontinue Zyprexa  at this time. Previously, psychiatry had recommended slowly titrating this medication down. However, given patient's worsening lethargy and concern that Zyprexa  may be contributing to oversedation, the recommendation is now to discontinue this medication altogether rather than continue gradual taper.  Patient will continue on Depakote  therapy per neurology recommendations as well as Prozac  20 mg daily. Involuntary commitment was renewed at this time, as patient has not yet received inpatient psychiatric care due to ongoing medical complications requiring continued medical hospitalization. Significant concern remains regarding patient's pattern of repeated intentional overdoses, which necessitates continued involuntary commitment and plan for inpatient psychiatric admission once medically cleared. Psychiatry will continue to round on patient daily while medically hospitalized.    08/08/2024: Patient was seen on rounds today by psychiatry. Patient was noted to be sleeping at the time of rounds, as he had received medications during a rapid response event earlier. Please reference detailed documentation in patient's chart for specific details regarding the rapid response.  Neurology was consulted by the primary medical team. Psychiatry collaborated with neurology service and primary medical team with  comprehensive chart  review. Supervising psychiatrist Dr. Donnelly reviewed patient's chart and noted documented history of multiple concussions from previous participation in rugby. There is significant concern regarding patient's current impulsivity combined with documented intentional suicide attempts following familial conflict.  Concern for potential traumatic brain injury (TBI) contributing to patient's psychiatric presentation has been raised. Repetitive head trauma from multiple concussions can result in chronic traumatic encephalopathy or post-concussive syndrome, which can manifest as impulsivity, mood dysregulation, emotional lability, and increased risk for suicide. TBI is on the differential diagnosis list and may be underlying or exacerbating patient's psychiatric symptoms and poor impulse control.  During collaboration with neurology, neurology service initiated Depakote  for both mood stabilization and impulse control management, which is an excellent medication choice for this patient. Depakote  (valproic  acid) is a mood stabilizer with anticonvulsant properties that has demonstrated efficacy in reducing impulsivity and aggression, particularly in patients with traumatic brain injury or impulse control disorders. It can help stabilize mood fluctuations and reduce impulsive decision-making that has contributed to patient's repeated suicide attempts in response to acute stressors.  Chart review reveals that during this hospitalization, patient was started on Seroquel , Ambien , and Zyprexa . Psychiatry team in coordination with the medical team has discontinued Seroquel  and Ambien  due to patient's increased fall risk and incident that occurred.  Psychiatry will plan to also titrate down patient's Zyprexa  gradually. However, it is important to safely titrate this medication slowly to avoid abrupt discontinuation, which could precipitate withdrawal symptoms or acute psychiatric decompensation.  Gradual tapering allows monitoring for return of psychiatric symptoms and ensures patient remains stable as medication is reduced.  Neurology has recommended 48-hour observation period for patient. Psychiatry will remain actively involved and will continue rounding on patient daily while patient is medically hospitalized to monitor psychiatric status, assess response to Depakote  initiation, monitor tolerance of medication changes, and coordinate ongoing care with neurology and primary medical team.    08/07/2024: Patient was assessed by psychiatry today. Patient denied current suicidal or homicidal ideation. He denied auditory or visual hallucinations. Patient continues to acknowledge that his initial overdose was an intentional suicide attempt following an argument with his family. However, despite denying current suicidal ideation, patient remains at high risk for self-harm at this time. Patient's recent intentional overdose, combined with apparent impulse control issues as evidenced by making a suicide attempt in response to an acute interpersonal conflict with family, demonstrates a pattern of poor coping and impulsive dangerous behaviors when faced with stressors. The short timeframe between the argument and suicide attempt suggests patient has difficulty regulating emotions and implementing alternative coping strategies during moments of distress, which places him at continued elevated risk for future impulsive self-harm if he encounters similar triggers or stressors.Recommendation continues for inpatient psychiatric admission following medical clearance. On current mental status examination, there was no objective evidence of mania, psychosis, or patient responding to internal stimuli.Patient was updated on the treatment plan and recommendation for inpatient psychiatric admission. Patient verbalized understanding of the care plan at this time.    08/06/2024: Patient was seen today during psychiatric  consultation rounds. Patient was alert and oriented X 3, calm, cooperative, and engaged in the interview. Patient was drowsy and lethargic during the conversation. He reports that he feels tired and weak and just prefers to sleep. Per clinical staff, patient has had trials of ambulating to assess his ability to walk and maintain his balance. Unfortunately, patient has not had the ability to walk and balance himself independently. Patient remains extremely tremulous with movement per the  clinical medical team. Patient reports his appetite is gradually returning. He continues to endorse feeling depressed, but he denies suicidal ideations. Patient reports having some anxiety as well which he rates both 2 out of 10. Patient is observed minimizing his symptoms today. Patient also denies homicidal ideations and perceptual disturbances. Patient remains on 1:1 observation per protocol and has a sister at bedside. Will continue to provide consultation until he transitions to the inpatient unit.   Patient is recommended for inpatient psychiatric admission following medical clearance based on significant clinical concerns. Patient presents with active and repeated suicide attempts, including the current presentation for intentional overdose on Benadryl  and possibly Wellbutrin, occurring shortly after a recent intentional Tylenol  overdose approximately one week ago on July 27, 2024. This pattern of repeated suicide attempts within a short timeframe represents imminent and ongoing danger to self requiring intensive psychiatric intervention in a secure, monitored environment.  Patient has extensive psychiatric history including major depressive disorder, PTSD, likely borderline personality disorder, psychogenic nonepileptic seizures, and documented history of multiple suicide attempts over time. Patient's mental health conditions are currently untreated, as he has no established psychiatrist or medication management  provider. Patient has only attended one therapy session at [facility], indicating he is in the very early stages of establishing outpatient mental health support and does not yet have adequate therapeutic relationship or coping skills developed to maintain safety in the community.  Patient identifies family conflict as a major trigger for his suicidal behavior and describes living in a chronically stressful home environment with mother, father, and sister, with whom he reports strained relationships and feelings of being less valued than his sister. Patient's living situation appears to be a significant ongoing stressor that contributes to his suicidal ideation and behavior. Patient explicitly does not want family involved in his care at this time.  Patient has limited treatment response history, having tried multiple medication trials (Prozac , Abilify  which worsened suicidal thoughts) and ECT treatment approximately 2 years ago that provided only temporary relief for a couple of months before depression returned. 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  Patient is currently unemployed due to mental health factors, which may contribute to feelings of worthlessness, hopelessness, and lack of purpose that can worsen depression and increase suicide risk. Patient has minimal outpatient support system established and no current psychiatric medication management, leaving him without adequate safety net or treatment resources in the community.  Inpatient psychiatric admission will provide safe, secure environment for suicide prevention and intensive monitoring during this high-risk period with pattern of repeated attempts, comprehensive psychiatric evaluation and diagnostic clarification, including assessment for borderline personality disorder given chart  documentation and pattern of repeated suicide attempts in context of interpersonal stressors, medication initiation and optimization for treatment-resistant depression, including consideration of medication combinations or novel approaches, establishment of intensive individual therapy to address underlying depression, PTSD, family conflict, and maladaptive coping patterns, dialectical behavior therapy (DBT) or other evidence-based interventions if borderline personality disorder is confirmed, to address emotion dysregulation and self-harm behaviors.  The recommendation for inpatient psychiatric admission following medical clearance was discussed with both patient and the primary medical team. Patient appeared to understand the recommendation and rationale for this level of care. Patient will remain medically hospitalized until cleared by the primary medical team, at which time he will be transferred to an inpatient psychiatric facility for continued evaluation, stabilization, and comprehensive treatment of his treatment-resistant depression, repeated suicide attempts, and  complex psychiatric needs.     Diagnoses:  Active Hospital problems: Principal Problem:   Suicidal overdose, initial encounter [facility]) Active Problems:   SSRI overdose, intentional self-harm, initial encounter (HCC)   Intentional SSRI (selective serotonin reuptake inhibitor) overdose (HCC)    Plan   ## Psychiatric Medication Recommendations:  Discontinued seroquel  and ambien  at this time  ## Medical Decision Making Capacity: Not specifically addressed in this encounter  ## Further Work-up:   -- most recent EKG on 08/03/2024 had QtC of 457 -- Pertinent labwork reviewed earlier this admission includes: BMP, CBC, TSH, CMP, acetaminophen  level, ethanol, salicylate level, CBC   ## Disposition:-- Patient is under IVC and recommended for inpatient psychiatric admission after medical stabilization  ## Behavioral / Environmental:  - No specific recommendations at this time.     ## Safety and Observation Level:  - Based on my clinical evaluation, I estimate the patient to be at moderate risk of self harm in the current setting. - At this time, we recommend  1:1 Observation. This decision is based on my review of the chart including patient's history and current presentation, interview of the patient, mental status examination, and consideration of suicide risk including evaluating suicidal ideation, plan, intent, suicidal or self-harm behaviors, risk factors, and protective factors. This judgment is based on our ability to directly address suicide risk, implement suicide prevention strategies, and develop a safety plan while the patient is in the clinical setting. Please contact our team if there is a concern that risk level has changed.  CSSR Risk Category:C-SSRS RISK CATEGORY: High Risk  Suicide Risk Assessment: Patient has following modifiable risk factors for suicide: lack of access to outpatient mental health resources, current symptoms: anxiety/panic, insomnia, impulsivity, anhedonia, hopelessness, triggering events, and recent psychiatric hospitalization, which we are addressing by recommending inpatient psychiatric admission for further monitoring and stabilization. Patient has following non-modifiable or demographic risk factors for suicide: male gender, history of suicide attempt, and psychiatric hospitalization Patient has the following protective factors against suicide: Access to outpatient mental health care  Thank you for this consult request. Recommendations have been communicated to the primary team.  We will continue to round while medically admitted at this time.   Zelda Sharps, NP         History of Present Illness  Relevant Aspects of [facility]   Patient Report:  Patient was assessed by psychiatry today. Patient endorsed intentionally overdosing on medications in an attempt to harm himself  due to family conflict. Patient reported he knows he intentionally took Benadryl  and stated he may have also taken Wellbutrin along with this, but reported he was honestly unsure about the Wellbutrin. Of note, chart review reveals patient has a recent intentional overdose on Tylenol  documented around July 27, 2024 of this year. Patient endorsed multiple suicide attempts in the past.  Patient reported that family is a major trigger for him and stated they know how to trigger me. Patient expressed he does not want family involved in his care currently. Patient reported he lives at home with his mother, father, and sister. He referred to his sister as the golden child and endorsed that relationships with all of his family members are strained, suggesting feelings of being less favored or valued within the family dynamic.  Patient reported he currently sees a therapist through [facility] but has only attended 1 session thus far. However, patient stated he does not see a psychiatrist or medication management provider at this time. Patient reported  his Wellbutrin prescription is from a long time ago and that PCPs have just continued the medication. Patient reported in the past he has trialed Prozac  and Abilify , with Abilify  reportedly worsening his suicidal thoughts. Patient also reported completing 12 sessions of electroconvulsive therapy (ECT) approximately 2 years ago, which he stated helped for a couple of months, but his depression returned after that period.  Patient endorsed previous mental health history of major depressive disorder and post-traumatic stress disorder. Through chart review, there was mention of potential borderline personality disorder. However, patient did not list this diagnosis during today's assessment. Patient endorsed history of seizures but reported his last known seizure was a couple of years ago. Chart review of recent admission notes documented through  [facility] lists patient's history as including borderline personality disorder, psychogenic nonepileptic seizures, major depressive disorder, PTSD, and history of multiple suicide attempts.  Patient endorsed rare alcohol use. He denied any other recreational drug use or tobacco use. Patient reported previous inpatient psychiatric admissions, with the most recent being what he described as last week when he intentionally overdosed on Tylenol . Patient denied any access to firearms or weapons and denied any upcoming known legal charges. Patient reported he is not currently employed, as he had to leave the workforce due to many factors including his mental health struggles.  Psych ROS:  Depression: Endorsed Anxiety: Endorsed Mania (lifetime and current): Denied Psychosis: (lifetime and current): Denied  Collateral information:  Patient declined to provide contact for collateral information at this time    Psychiatric and Social History  Psychiatric History:  Information collected from patient/chart review  Prev Dx/Sx: MDD, borderline personality disorder, PTSD, multiple previous suicide attempts Current Psych Provider: Denied Home Meds (current): Wellbutrin Previous Med Trials: Abilify , Prozac  Therapy: Yes  Prior Psych Hospitalization: Yes Prior Self Harm: Yes Prior Violence: Denied  Family Psych History: Reported brother has history of bipolar disorder Family Hx suicide: Denied  Social History:   Educational Hx: Some college 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 Hx: Denied Living Situation: With mother, father and sister  Access to weapons/lethal means: Denied access to firearms  Substance History Alcohol: Endorsed occasional use History of alcohol withdrawal seizures denied History of DT's denied Tobacco: Denied Illicit drugs: Denied Prescription drug abuse: Denied Rehab hx: Denied  Exam Findings  Physical Exam: Reviewed and  agree with the physical exam findings conducted by the medical provider Vital Signs:  Temp:  [97.3 F (36.3 C)-98.3 F (36.8 C)] 97.8 F (36.6 C) (01/14 1017) Pulse Rate:  [56-137] 73 (01/14 1025) Resp:  [11-36] 17 (01/14 1025) BP: (94-144)/(50-119) 109/60 (01/14 1017) SpO2:  [94 %-100 %] 97 % (01/14 1025) Blood pressure 109/60, pulse 73, temperature 97.8 F (36.6 C), temperature source Oral, resp. rate 17, height 6' 3 (1.905 m), weight 109.6 kg, SpO2 97%. Body mass index is 30.2 kg/m.    Mental Status Exam: General Appearance: Fairly Groomed  Orientation:  Full (Time, Place, and Person)  Memory:  Fair  Concentration:  Concentration: Fair  Recall:  Fair  Attention  Fair  Eye Contact:  Minimal  Speech:  Clear and Coherent  Language:  Fair  Volume:  Normal  Mood: Depressed  Affect:  Depressed  Thought Process:  Coherent and Linear  Thought Content:  Logical  Suicidal Thoughts:  Yes.  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  Family History:  The patient's family history includes Healthy in his mother.  Medical History: Past Medical History:  Diagnosis Date   Anxiety    Depression    Major depressive disorder    Seizures (HCC)     Surgical History: Past Surgical History:  Procedure Laterality Date   NO PAST SURGERIES       Medications:  Current Medications[1]  Allergies: Allergies[2]  Zelda Sharps, NP This note was created using Dragon dictation software. Please excuse any inadvertent transcription errors. Case was  discussed with supervising physician Dr. Jadapalle who is agreeable with current plan.         [1]  Current Facility-Administered Medications:    acetaminophen  (TYLENOL ) tablet 650 mg, 650 mg, Oral, Q6H PRN, 650 mg at 08/09/24 1025 **OR** acetaminophen  (TYLENOL ) suppository 650 mg, 650 mg, Rectal, Q6H PRN, Laurita Cort DASEN, MD   Chlorhexidine  Gluconate Cloth 2 % PADS 6 each, 6 each, Topical, Daily, Sreenath, Sudheer B, MD   enoxaparin  (LOVENOX ) injection 40 mg, 40 mg, Subcutaneous, Q24H, Zhang, Ping T, MD, 40 mg at 08/08/24 2157   FLUoxetine  (PROZAC ) capsule 20 mg, 20 mg, Oral, Daily, Siadecki, Sebastian, MD, 20 mg at 08/09/24 1024   hydrOXYzine  (ATARAX ) tablet 25 mg, 25 mg, Oral, Q8H PRN, Zhang, Ping T, MD   LORazepam  (ATIVAN ) injection 1 mg, 1 mg, Intravenous, Q1H PRN, Mansy, Jan A, MD   LORazepam  (ATIVAN ) injection 1 mg, 1 mg, Intravenous, Q1H PRN, Mansy, Jan A, MD   LORazepam  (ATIVAN ) injection 4 mg, 4 mg, Intravenous, Once, Donati-Garmon, Natalie M, NP   metoCLOPramide  (REGLAN ) injection 10 mg, 10 mg, Intravenous, Q6H PRN, Zhang, Ping T, MD, 10 mg at 08/08/24 1857   OLANZapine  (ZYPREXA ) tablet 10 mg, 10 mg, Oral, QHS, Agbata, Tochukwu, MD   Oral care mouth rinse, 15 mL, Mouth Rinse, PRN, Sreenath, Sudheer B, MD   senna-docusate (Senokot-S) tablet 1 tablet, 1 tablet, Oral, QHS PRN, Laurita Cort T, MD   tamsulosin  (FLOMAX ) capsule 0.4 mg, 0.4 mg, Oral, Daily, Sreenath, Sudheer B, MD, 0.4 mg at 08/08/24 1622   valproate (DEPACON ) 500 mg in dextrose  5 % 50 mL IVPB, 500 mg, Intravenous, Q12H, Mansy, Jan A, MD [2]  Allergies Allergen Reactions   Bee Venom Anaphylaxis   Gabapentin  Other (See Comments) and Anaphylaxis    Muscle twitching   Haldol [Haloperidol] Anaphylaxis and Other (See Comments)    Per father, dystonic reaction with  muscle contractures- also   Hornet Venom Anaphylaxis and Itching   Peanut-Containing Drug Products Anaphylaxis   Wasp Venom Anaphylaxis   Beeswax Swelling

## 2024-08-09 NOTE — Progress Notes (Signed)
 Pt transferred to rm 229 at this time with sitter. VSS prior to transfer.

## 2024-08-09 NOTE — Progress Notes (Signed)
 Ceribell connected and initial recording reviewed, no ongoing seizure activity at this time.   Aisha Seals, MD [facility]   If 7pm- 7am, please page neurology on call as listed in AMION.

## 2024-08-09 NOTE — Consult Note (Addendum)
 NEUROLOGY CONSULT NOTE   Date of service: 08/08/24 Patient Name: Vincent Frank MRN:  [id number] DOB:  11/11/2000 Chief Complaint: spells Requesting Provider: Jhonny Calvin NOVAK, MD  History of Present Illness   Vincent Frank CURRENT is a 24 y.o. 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  He told me that he doesn't remember exactly how much he took of either medication but thinks it was only a couple of capsules of wellbutrin but more benadryl .  Neurology consulted after patient had episode overnight prior to planned hospital discharge to inpatient behavioral health center in which he became unresponsive and shook all over and had L gaze deviation. Vital signs stable throughout. Ativan  and keppra  60mg /kg administered and patient transferred to progressive.   He is still mildly lethargic when I saw him this afternoon but was lucid. He was able to tell me this episode and another this morning felt similar to the other episodes he has had since childhood extensively documented to be PNES.    ROS   Comprehensive ROS performed and pertinent positives documented in HPI   Past History   Past Medical History:  Diagnosis Date   Anxiety    Depression    Major depressive disorder    Seizures (HCC)     Past Surgical History:  Procedure Laterality Date   NO PAST SURGERIES      Family History: Family History  Problem Relation Age of Onset   Healthy Mother     Social History  reports that he has never smoked. He has never used smokeless tobacco. He reports that he does not currently use alcohol. He reports that he does not currently use drugs.  Allergies[1]  Medications  Current Medications[2]  Vitals    Vitals:   08/09/24 0430 08/09/24 0445 08/09/24 0500 08/09/24 0515  BP: 112/60 110/67 108/61 106/65  Pulse: 86 79 84 74  Resp: 18 17 17 17   Temp:      TempSrc:      SpO2: 98% 98% 96% 98%  Weight:      Height:        Body mass index is 30.2 kg/m.   Physical Exam    Physical Exam Gen: A&Ox4, NAD HEENT: Atraumatic, normocephalic; oropharynx clear, tongue without atrophy or fasciculations. Resp: CTAB, normal work of breathing CV: RRR, extremities appear well-perfused. Abd: soft/NT/ND Extrem: Nml bulk; no cyanosis, clubbing, or edema.  Neuro: *MS: A&O x4. Follows multi-step commands.  *Speech: no dysarthria or aphasia, able to name and repeat. *CN:    I: Deferred   II,III: PERRLA, VFF by confrontation, optic discs not visualized 2/2 pupillary constriction   III,IV,VI: EOMI w/o nystagmus, no ptosis   V: Sensation intact from V1 to V3 to LT   VII: Eyelid closure was full.  Smile symmetric.   VIII: Hearing intact to voice   IX,X: Voice normal, palate elevates symmetrically    XI: SCM/trap 5/5 bilat   XII: Tongue protrudes midline, no atrophy or fasciculations  *Motor:   Normal bulk.  No tremor, rigidity or bradykinesia. No pronator drift.   Strength: Dlt Bic Tri WE WrF FgS Gr HF KnF KnE PlF DoF    Left 5 5 5 5 5 5 5 5 5 5 5 5     Right 5 5 5 5  5  5 5 5 5 5 5 5    *Sensory: Intact to light touch, pinprick, temperature vibration throughout. Symmetric. Propioception intact bilat.  No double-simultaneous extinction.  *Coordination:  Finger-to-nose, heel-to-shin, rapid alternating motions were intact. *Reflexes:  2+ and symmetric throughout without clonus; toes down-going bilat *Gait: deferred    Labs/Imaging/Neurodiagnostic studies   CBC:  Recent Labs  Lab 09/05/2024 1907 08/08/24 0523  WBC 7.2 6.5  NEUTROABS 4.6 3.4  HGB 12.6* 12.5*  HCT 39.0 37.9*  MCV 76.8* 75.3*  PLT 274 218   Basic Metabolic Panel:  Lab Results  Component Value Date   NA 141 08/08/2024   K  4.2 08/08/2024   CO2 23 08/08/2024   GLUCOSE 90 08/08/2024   BUN 10 08/08/2024   CREATININE 1.05 08/08/2024   CALCIUM 9.6 08/08/2024   GFRNONAA >60 08/08/2024   Lipid Panel:  Lab Results  Component Value Date   LDLCALC 162 (H) 03/03/2023   HgbA1c:  Lab Results  Component Value Date   HGBA1C 5.2 03/03/2023   Urine Drug Screen:     Component Value Date/Time   LABOPIA NEGATIVE 08/08/2024 1631   COCAINSCRNUR NEGATIVE 08/08/2024 1631   LABBENZ POSITIVE (A) 08/08/2024 1631   AMPHETMU NEGATIVE 08/08/2024 1631   THCU NEGATIVE 08/08/2024 1631   LABBARB NEGATIVE 08/08/2024 1631    Alcohol Level     Component Value Date/Time   ETH <15 08/02/2024 1847   INR  Lab Results  Component Value Date   INR 1.1 11/09/2023   APTT No results found for: APTT AED levels: No results found for: PHENYTOIN, ZONISAMIDE, LAMOTRIGINE , LEVETIRACETA  CT Head without contrast(Personally reviewed): CT head 08/06/24 normal CT head 08/08/24 normal  ASSESSMENT   Vincent Frank is a 24 y.o. 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  Neurology consulted after patient had episode overnight prior to planned hospital discharge to inpatient behavioral health center in which he became unresponsive and shook all over and had L gaze deviation. Vital signs stable throughout. Ativan  and keppra  60mg /kg administered and patient transferred to progressive.   He is still mildly lethargic when I saw him this afternoon but was lucid. He was able to tell me this episode and another this morning felt similar to the other episodes he has had since childhood extensively documented to be PNES.  He told me that he doesn't remember exactly how  much he took of either medication but thinks it was only a couple of capsules of wellbutrin but more benadryl . I am not concerned that this amount of wellbutrin caused an epileptic seizure especially 5 days after the overdose occurred and patient was admitted.  He has clear insight on some aspects of his behavior and mental health but not on others. He was able to tell me that every time he committed suicide even when he jumped off a 35 foot cliff his goal wasn't to die permanently it was just to stop the feelings inside of him. We discussed that his episodes are PNES not electrical seizures. He expressed understanding and said my explanation resonated.   He has an extensive history of concussions playing rugby. Psychiatry suggested his TBI hx may be playing a role in his impulsivity. I agree.  RECOMMENDATIONS   Patient has documented history of PNES since childhood. Feels like  he is falling then blacks out. Sometimes has shaking up to and including generalized convulsions. These are not electrical seizures and should not be treated with Ativan  or AED loads unless accompanied by vital sign instability (which may indicate actual seizure or other potentially serious etiology of symptoms).  Plan discussed in person /w hospitalist, psychiatry, RN, and patient. All are in agreement   Medication changes today: - start depakote  500mg  DR BID (as mood stabilizer, not as AED) - D/c Seroquel   - Continue olanzapine  for now, so as not to make multiple medication changes at one time  - We greatly appreciate psychiatry's ongoing input - Patient will need f/u in TBI clinic at [facility], I will refer prior to hospital discharge - Will continue to follow closely ______________________________________________________________________    Signed, Elida CHRISTELLA Ross, MD [facility]     [1]  Allergies Allergen Reactions   Bee Venom Anaphylaxis   Gabapentin  Other (See Comments) and Anaphylaxis     Muscle twitching   Haldol [Haloperidol] Anaphylaxis and Other (See Comments)    Per father, dystonic reaction with  muscle contractures- also   Hornet Venom Anaphylaxis and Itching   Peanut-Containing Drug Products Anaphylaxis   Wasp Venom Anaphylaxis   Beeswax Swelling  [2]  Current Facility-Administered Medications:    acetaminophen  (TYLENOL ) tablet 650 mg, 650 mg, Oral, Q6H PRN, 650 mg at 08/08/24 1857 **OR** acetaminophen  (TYLENOL ) suppository 650 mg, 650 mg, Rectal, Q6H PRN, Laurita Cort DASEN, MD   Chlorhexidine  Gluconate Cloth 2 % PADS 6 each, 6 each, Topical, Daily, Sreenath, Sudheer B, MD   enoxaparin  (LOVENOX ) injection 40 mg, 40 mg, Subcutaneous, Q24H, Zhang, Ping T, MD, 40 mg at 08/08/24 2157   FLUoxetine  (PROZAC ) capsule 20 mg, 20 mg, Oral, Daily, Siadecki, Sebastian, MD, 20 mg at 08/08/24 0946   hydrOXYzine  (ATARAX ) tablet 25 mg, 25 mg, Oral, Q8H PRN, Zhang, Ping T, MD   levETIRAcetam  (KEPPRA ) undiluted injection 500 mg, 500 mg, Intravenous, Q12H, Mansy, Jan A, MD   LORazepam  (ATIVAN ) injection 1 mg, 1 mg, Intravenous, Q1H PRN, Mansy, Jan A, MD   LORazepam  (ATIVAN ) injection 1 mg, 1 mg, Intravenous, Q1H PRN, Mansy, Jan A, MD   LORazepam  (ATIVAN ) injection 4 mg, 4 mg, Intravenous, Once, Donati-Garmon, Natalie M, NP   metoCLOPramide  (REGLAN ) injection 10 mg, 10 mg, Intravenous, Q6H PRN, Zhang, Ping T, MD, 10 mg at 08/08/24 1857   OLANZapine  (ZYPREXA ) tablet 10 mg, 10 mg, Oral, QHS, Zhang, Ping T, MD, 10 mg at 08/08/24 2155   Oral care mouth rinse, 15 mL, Mouth Rinse, PRN, Sreenath, Sudheer B, MD   senna-docusate (Senokot-S) tablet 1 tablet, 1 tablet, Oral, QHS PRN, Zhang, Ping T, MD   tamsulosin  (FLOMAX ) capsule 0.4 mg, 0.4 mg, Oral, Daily, Sreenath, Sudheer B, MD, 0.4 mg at 08/08/24 1622   valproate (DEPACON ) 500 mg in dextrose  5 % 50 mL IVPB, 500 mg, Intravenous, Q12H, Mansy, Madison LABOR, MD

## 2024-08-10 ENCOUNTER — Inpatient Hospital Stay

## 2024-08-10 DIAGNOSIS — T43222A Poisoning by selective serotonin reuptake inhibitors, intentional self-harm, initial encounter: Secondary | ICD-10-CM | POA: Diagnosis not present

## 2024-08-10 LAB — BASIC METABOLIC PANEL WITH GFR
Anion gap: 13 (ref 5–15)
BUN: 12 mg/dL (ref 6–20)
CO2: 25 mmol/L (ref 22–32)
Calcium: 9.8 mg/dL (ref 8.9–10.3)
Chloride: 102 mmol/L (ref 98–111)
Creatinine, Ser: 1.03 mg/dL (ref 0.61–1.24)
GFR, Estimated: >60 mL/min
Glucose, Bld: 115 mg/dL — ABNORMAL HIGH (ref 70–99)
Potassium: 4.1 mmol/L (ref 3.5–5.1)
Sodium: 140 mmol/L (ref 135–145)

## 2024-08-10 LAB — RESP PANEL BY RT-PCR (RSV, FLU A&B, COVID)  RVPGX2
Influenza A by PCR: NEGATIVE
Influenza B by PCR: NEGATIVE
Resp Syncytial Virus by PCR: NEGATIVE
SARS Coronavirus 2 by RT PCR: NEGATIVE

## 2024-08-10 LAB — GLUCOSE, CAPILLARY: Glucose-Capillary: 126 mg/dL — ABNORMAL HIGH (ref 70–99)

## 2024-08-10 LAB — MAGNESIUM: Magnesium: 2.1 mg/dL (ref 1.7–2.4)

## 2024-08-10 MED ORDER — DIVALPROEX SODIUM 500 MG PO DR TAB: 500.0000 mg | DELAYED_RELEASE_TABLET | Freq: Two times a day (BID) | ORAL | 0 refills | Status: AC

## 2024-08-10 NOTE — Progress Notes (Signed)
 Spoke with RN at [facility] [employer]. Stated she will need to speak to the intake personal before receiving report. Updated nursing team.

## 2024-08-10 NOTE — Plan of Care (Signed)

## 2024-08-10 NOTE — Progress Notes (Addendum)
 " Progress Note   Patient: Vincent Frank FMW:[id number] DOB: Oct 13, 2000 DOA: 08/02/2024     5 DOS: the patient was seen and examined on 08/10/2024   Brief hospital course:  24 y.o. male with medical history significant of MDD, seizure disorder, presented with suicidal ideation and suicidal attempt with Wellbutrin and Benadryl  overdose.   Patient showed up in ED yesterday evening complaining of  feeling a lot of stress in life and took many pills of Wellbutrin and Benadryl  estimated time of ingestion was around 1700.  Patient was lethargic but oriented x 3.  No other complaints such as pain, no nauseous vomiting no headache or vision changes.  No fall at home.   1/13: RRT called early this morning due to seizure-like activity.  Patient transferred to stepdown.  Seizure monitoring.  Neurology consulted.  1/14 : Patient was seen by neurology.  He has a diagnosis of psychogenic nonepileptic seizures.  Recommended to start patient on Depakote  500 mg twice daily as a mood stabilizer not on antiepileptic, discontinue Seroquel  and continue olanzapine .  Recommended to monitor patient for 48 hours prior to discharge.        Assessment and Plan:  Seizure-like activity History of psychogenic nonepileptic seizures RRT called early a.m. 1/13 due to patient being found down in the bathroom.  Noted him to have seizure-like activity with left gaze deviation and left hand tremors.  Received total 6 mg IV Ativan  and transferred to stepdown. Neurology consulted.  Case discussed with Dr. Matthews.  These episodes are not electrical seizures and Ativan  is not indicated.  Depakote  started after conversation between neurology and psychiatry.   SSRI overdose Benadryl  overdose Suicidal ideation and suicidal attempt Neurologic exam relatively benign however does have some persistent tremors Appreciate psychiatry input Will continue to monitor on the medicine service.   Continue IVC.   Continue one-to-one  sitter.  Psychiatry following in consultation role.   Patient has an accepting facility in [facility] .  TOC spoke with the facility who states they will hold the bed until medical workup is complete.   AKI Urinary retention Likely secondary to ingestion of anticholinergics Resolved      Prolonged Qtc Tremors Repeat EKG with improved QTc within reference range.      Major depression disorder Psychiatry consulted Continue IVC Plan to transfer to inpatient behavioral unit at [facility] once he is medically stabilized          Subjective: No new complaints.  Sleeping but arouses easily  Physical Exam: Vitals:   08/09/24 2339 08/09/24 2344 08/10/24 0248 08/10/24 0407  BP: (!) 137/91 137/77 120/70 107/86  Pulse: (!) 125 100 (!) 106 (!) 104  Resp:   20 18  Temp:   98.1 F (36.7 C) 98.2 F (36.8 C)  TempSrc:      SpO2: 99% 99% 97% 97%  Weight:      Height:       General exam: Sleeping but arouses easily Respiratory system: Lungs clear.  No work of breathing.  Room air Cardiovascular system: S1-S2, RRR, no murmurs, no pedal edema Gastrointestinal system:, Soft, NT/ND, normal bowel sounds Central nervous system:  No focal neurological deficits.  Able to move all extremities Extremities: Symmetric 5 x 5 power. Skin: No rashes, lesions or ulcers Psychiatry: Judgement and insight appear normal. Mood & affect blunted.      Data Reviewed:  There are no new results to review at this time.  Family Communication: None  Disposition: Status  is: Inpatient Remains inpatient appropriate because: [email or web address] [email or web address]  Planned Discharge Destination: Inpatient behavioral care unit    Time spent: 50 minutes  Author: Aimee Somerset, MD 08/10/2024 11:00 AM  For on call review [email or web address].  "

## 2024-08-10 NOTE — Discharge Summary (Addendum)
 " Physician Discharge Summary   Patient: Vincent Frank MRN: [id number] DOB: Sep 10, 2000  Admit date:     08/02/2024  Discharge date: 08/10/24  Discharge Physician: Jshon Ibe   PCP: [name], MD   Recommendations at discharge:   Continue one-to-one suicide precautions Needs to follow-up with the TBI clinic at [facility] Follow-up with psychiatry as an outpatient  Discharge Diagnoses: Principal Problem:   SSRI overdose, intentional self-harm, initial encounter [facility]) Active Problems:   MDD (major depressive disorder)   Psychogenic nonepileptic seizure   Intentional SSRI (selective serotonin reuptake inhibitor) overdose (HCC)  Resolved Problems:   * No resolved hospital problems. *  Hospital Course: Vincent Frank is a 24 y.o. male with medical history significant of MDD, seizure disorder, presented with suicidal ideation and suicidal attempt with Wellbutrin and Benadryl  overdose.   Patient showed up in ED yesterday evening complaining of  feeling a lot of stress in life and took many pills of Wellbutrin and Benadryl  estimated time of ingestion was around 1700.  Patient was lethargic but oriented x 3.  No other complaints such as pain, no nauseous vomiting no headache or vision changes.  No fall at home.   ED Course: Vital signs are stable, showed QTc 683.  Poison Control Center was contacted who recommended IV hydration and supportive care and monitor for 24 hours.  Psychiatry consulted and patient was placed on IVC.  This morning patient reported feeling much better and denied any suicidal ideation.  AST 24 ALT 38 bicarb 22.       Assessment and Plan:  Seizure-like activity Psychogenic nonepileptic seizures RRT called early a.m. 1/13 due to patient being found down in the bathroom.  Noted to to have seizure-like activity with left gaze deviation and left hand tremors.  Received total 6 mg IV Ativan  and transferred to stepdown.    Neurology consulted.  Case discussed with Dr. Matthews.  These episodes are not electrical seizures and Ativan  is not indicated.  Depakote  started after conversation between neurology and psychiatry as a mood stabilizer and not an antiepileptic agent. Patient had another episode on 1/15 but is back to his baseline and has been cleared by neurology for discharge   SSRI overdose Benadryl  overdose Suicidal ideation and suicidal attempt Major depression disorder Neurologic exam relatively benign however does have some persistent tremors Continue IVC.   Continue one-to-one sitter.   Psychiatry following in consultation role.   Patient has an accepting facility in [place] .      AKI Urinary retention Likely secondary to ingestion of anticholinergics Resolved Encourage p.o. intake and ambulation     Prolonged Qtc Tremors Repeat EKG with improved QTc within reference range.             Consultants: Neurology, psychiatry Procedures performed: None  Disposition: Inpatient behavioral health unit Diet recommendation:  Discharge Diet Orders (From admission, onward)     Start     Ordered   08/10/24 0000  Diet general        08/10/24 1047           Regular diet DISCHARGE MEDICATION: Allergies as of 08/10/2024       Reactions   Bee Venom Anaphylaxis   Gabapentin  Other (See Comments), Anaphylaxis   Muscle twitching   Haldol [haloperidol] Anaphylaxis, Other (See Comments)   Per father, dystonic reaction with  muscle contractures- also   Hornet Venom Anaphylaxis, Itching   Peanut-containing Drug Products Anaphylaxis   Wasp Venom  Anaphylaxis   Beeswax Swelling        Medication List     STOP taking these medications    ARIPiprazole  10 MG tablet Commonly known as: ABILIFY    ARIPiprazole  15 MG tablet Commonly known as: ABILIFY    ARIPiprazole  5 MG tablet Commonly known as: ABILIFY    hydrOXYzine  25 MG tablet Commonly known as: ATARAX    lamoTRIgine  25  MG tablet Commonly known as: LAMICTAL    lamoTRIgine  50 MG 24 hour tablet Commonly known as: LAMICTAL  XR   naproxen  500 MG tablet Commonly known as: NAPROSYN    oxybutynin  5 MG tablet Commonly known as: DITROPAN    oxyCODONE -acetaminophen  5-325 MG tablet Commonly known as: PERCOCET/ROXICET   QUEtiapine  100 MG tablet Commonly known as: SEROQUEL    traZODone  50 MG tablet Commonly known as: DESYREL        TAKE these medications    divalproex  500 MG DR tablet Commonly known as: DEPAKOTE  Take 1 tablet (500 mg total) by mouth every 12 (twelve) hours.   FLUoxetine  20 MG capsule Commonly known as: PROZAC  Take 3 capsules (60 mg total) by mouth daily for 14 days. What changed: Another medication with the same name was removed. Continue taking this medication, and follow the directions you see here.   hydrOXYzine  25 MG capsule Commonly known as: VISTARIL  Take 25 mg by mouth every 8 (eight) hours as needed.   OLANZapine  5 MG tablet Commonly known as: ZYPREXA  Take 10 mg by mouth at bedtime.        Discharge Exam: Filed Weights   08/03/24 1546 08/08/24 0325  Weight: 103 kg 109.6 kg   General exam: Sleeping but arouses easily Respiratory system: Lungs clear.  No work of breathing.  Room air Cardiovascular system: S1-S2, RRR, no murmurs, no pedal edema Gastrointestinal system:, Soft, NT/ND, normal bowel sounds Central nervous system:  No focal neurological deficits.  Able to move all extremities Extremities: Symmetric 5 x 5 power. Skin: No rashes, lesions or ulcers Psychiatry: Judgement and insight appear normal. Mood & affect blunted.        Condition at discharge: stable  The results of significant diagnostics from this hospitalization (including imaging, microbiology, ancillary and laboratory) are listed below for reference.   Imaging Studies: CT HEAD WO CONTRAST ( ) Result Date: 08/10/2024 EXAM: CT HEAD WITHOUT CONTRAST 08/10/2024 12:09:47 AM TECHNIQUE: CT of  the head was performed without the administration of intravenous contrast. Automated exposure control, iterative reconstruction, and/or weight based adjustment of the mA/kV was utilized to reduce the radiation dose to as low as reasonably achievable. COMPARISON: 08/08/2024 CLINICAL HISTORY: fall fall fall fall fall FINDINGS: BRAIN AND VENTRICLES: No acute hemorrhage. No evidence of acute infarct. No hydrocephalus. No extra-axial collection. No mass effect or midline shift. ORBITS: No acute abnormality. SINUSES: No acute abnormality. SOFT TISSUES AND SKULL: No acute soft tissue abnormality. No skull fracture. IMPRESSION: 1. No acute intracranial abnormality. Electronically signed by: Morgane Naveau MD 08/10/2024 12:15 AM EST RP Workstation: [id number]   CT CERVICAL SPINE WO CONTRAST Result Date: 08/10/2024 EXAM: CT CERVICAL SPINE WITHOUT CONTRAST 08/10/2024 12:09:47 AM TECHNIQUE: CT of the cervical spine was performed without the administration of intravenous contrast. Multiplanar reformatted images are provided for review. Automated exposure control, iterative reconstruction, and/or weight based adjustment of the mA/kV was utilized to reduce the radiation dose to as low as reasonably achievable. COMPARISON: None available. CLINICAL HISTORY: Fall Fall FINDINGS: BONES AND ALIGNMENT: No acute fracture or traumatic malalignment. DEGENERATIVE CHANGES: No significant degenerative changes. SOFT TISSUES: No prevertebral soft  tissue swelling. IMPRESSION: 1. No significant abnormality Electronically signed by: Morgane Naveau MD 08/10/2024 12:14 AM EST RP Workstation: [id number]   Rapid EEG Result Date: 08/09/2024 Shelton Arlin KIDD, MD     08/09/2024  9:27 AM Patient Name: Vincent Frank MRN: [id number] Epilepsy Attending: Arlin KIDD Shelton Referring Physician/Provider: Lawence Madison LABOR, MD Date: 08/09/2024 Duration: 1 hour Patient history: 24yo M with seizure like episode. Rapid eeg to evaluate for seizure Level of  alertness: Asleep/lethargic AEDs during EEG study: VPA, LEV Technical aspects: This EEG was obtained using a 10 lead EEG system positioned circumferentially without any parasagittal coverage (rapid EEG). Computer selected EEG is reviewed as  well as background features and all clinically significant events. Description: EEG showed 15 to 18 Hz, 2-3 uV beta activity  distributed symmetrically and diffusely.. Hyperventilation and photic stimulation were not performed.   IMPRESSION: This study is within normal limits. No seizures or epileptiform discharges were seen throughout the recording. If suspicion for interictal activity remains a concern, a conventional eeg with full montage and video can be considered. Priyanka KIDD Shelton   CT HEAD WO CONTRAST ( ) Result Date: 08/08/2024 CLINICAL DATA:  Shell evaluation for seizure disorder, clinical change. EXAM: CT HEAD WITHOUT CONTRAST TECHNIQUE: Contiguous axial images were obtained from the base of the skull through the vertex without intravenous contrast. RADIATION DOSE REDUCTION: This exam was performed according to the departmental dose-optimization program which includes automated exposure control, adjustment of the mA and/or kV according to patient size and/or use of iterative reconstruction technique. COMPARISON:  CT from 08/06/2024. FINDINGS: Brain: Cerebral volume within normal limits. No acute intracranial hemorrhage. No acute large vessel territory infarct. No mass lesion or midline shift. No hydrocephalus or extra-axial fluid collection. Vascular: No abnormal hyperdense vessel. Skull: Scalp soft tissues demonstrate no acute finding. Calvarium intact. Sinuses/Orbits: Globes orbital soft tissues within normal limits. Paranasal sinuses and mastoid air cells are clear. Other: None. IMPRESSION: Stable head CT.  No acute intracranial abnormality. Electronically Signed   By: Morene Hoard M.D.   On: 08/08/2024 05:46   CT HEAD WO CONTRAST ( ) Result Date:  08/06/2024 EXAM: CT HEAD WITHOUT CONTRAST 08/06/2024 09:36:35 PM TECHNIQUE: CT of the head was performed without the administration of intravenous contrast. Automated exposure control, iterative reconstruction, and/or weight based adjustment of the mA/kV was utilized to reduce the radiation dose to as low as reasonably achievable. COMPARISON: 05/31/2024 CLINICAL HISTORY: Mental status change, unknown cause. FINDINGS: BRAIN AND VENTRICLES: No acute hemorrhage. No evidence of acute infarct. No hydrocephalus. No extra-axial collection. No mass effect or midline shift. ORBITS: No acute abnormality. SINUSES: No acute abnormality. SOFT TISSUES AND SKULL: No acute soft tissue abnormality. No skull fracture. IMPRESSION: 1. No acute intracranial abnormality. Electronically signed by: [name] MD 08/06/2024 09:53 PM EST RP Workstation: [id number]    Microbiology: Results for orders placed or performed during the hospital encounter of 08/02/24  Resp panel by RT-PCR (RSV, Flu A&B, Covid)     Status: None   Collection Time: 08/07/24  1:22 PM  Result Value Ref Range Status   SARS Coronavirus 2 by RT PCR NEGATIVE NEGATIVE Final    Comment: (NOTE) SARS-CoV-2 target nucleic acids are NOT DETECTED.  The SARS-CoV-2 RNA is generally detectable in upper respiratory specimens during the acute phase of infection. The lowest concentration of SARS-CoV-2 viral copies this assay can detect is 138 copies/mL. A negative result does not preclude SARS-Cov-2 infection and should not be used as the sole basis for  treatment or other patient management decisions. A negative result may occur with  improper specimen collection/handling, submission of specimen other than nasopharyngeal swab, presence of viral mutation(s) within the areas targeted by this assay, and inadequate number of viral copies(<138 copies/mL). A negative result must be combined with clinical observations, patient history, and  epidemiological information. The expected result is Negative.  Fact Sheet for Patients:  bloggercourse.com  Fact Sheet for Healthcare Providers:  seriousbroker.it  This test is no t yet approved or cleared by the [place]  FDA and  has been authorized for detection and/or diagnosis of SARS-CoV-2 by FDA under an Emergency Use Authorization (EUA). This EUA will remain  in effect (meaning this test can be used) for the duration of the COVID-19 declaration under Section 564(b)(1) of the Act, 21 U.S.C.section 360bbb-3(b)(1), unless the authorization is terminated  or revoked sooner.       Influenza A by PCR NEGATIVE NEGATIVE Final   Influenza B by PCR NEGATIVE NEGATIVE Final    Comment: (NOTE) The Xpert Xpress SARS-CoV-2/FLU/RSV plus assay is intended as an aid in the diagnosis of influenza from Nasopharyngeal swab specimens and should not be used as a sole basis for treatment. Nasal washings and aspirates are unacceptable for Xpert Xpress SARS-CoV-2/FLU/RSV testing.  Fact Sheet for Patients: bloggercourse.com  Fact Sheet for Healthcare Providers: seriousbroker.it  This test is not yet approved or cleared by the [place]  FDA and has been authorized for detection and/or diagnosis of SARS-CoV-2 by FDA under an Emergency Use Authorization (EUA). This EUA will remain in effect (meaning this test can be used) for the duration of the COVID-19 declaration under Section 564(b)(1) of the Act, 21 U.S.C. section 360bbb-3(b)(1), unless the authorization is terminated or revoked.     Resp Syncytial Virus by PCR NEGATIVE NEGATIVE Final    Comment: (NOTE) Fact Sheet for Patients: bloggercourse.com  Fact Sheet for Healthcare Providers: seriousbroker.it  This test is not yet approved or cleared by the [place]  FDA and has been  authorized for detection and/or diagnosis of SARS-CoV-2 by FDA under an Emergency Use Authorization (EUA). This EUA will remain in effect (meaning this test can be used) for the duration of the COVID-19 declaration under Section 564(b)(1) of the Act, 21 U.S.C. section 360bbb-3(b)(1), unless the authorization is terminated or revoked.  Performed at [facility], [address] [place], [place] [address]   MRSA Next Gen by PCR, Nasal     Status: None   Collection Time: 08/08/24  3:02 AM   Specimen: Nasal Mucosa; Nasal Swab  Result Value Ref Range Status   MRSA by PCR Next Gen NOT DETECTED NOT DETECTED Final    Comment: (NOTE) The GeneXpert MRSA Assay (FDA approved for NASAL specimens only), is one component of a comprehensive MRSA colonization surveillance program. It is not intended to diagnose MRSA infection nor to guide or monitor treatment for MRSA infections. Test performance is not FDA approved in patients less than 40 years old. Performed at [facility], [address] [place], [place] [address]     Labs: CBC: Recent Labs  Lab 08/04/24 0522 08/05/24 0644 08/06/24 1907 08/08/24 0523  WBC 7.2 6.1 7.2 6.5  NEUTROABS  --  3.2 4.6 3.4  HGB 13.3 12.0* 12.6* 12.5*  HCT 41.7 37.1* 39.0 37.9*  MCV 76.9* 76.0* 76.8* 75.3*  PLT 275 248 274 218   Basic Metabolic Panel: Recent Labs  Lab 08/04/24 0522 08/05/24 0644 08/06/24 1907 08/08/24 0523 08/10/24 0820  NA  140 140 139 141 140  K 4.2 3.9 4.5 4.2 4.1  CL 103 108 104 107 102  CO2 25 23 24 23 25   GLUCOSE 100* 93 92 90 115*  BUN 19 14 10 10 12   CREATININE 1.37* 1.05 1.05 1.05 1.03  CALCIUM 9.6 9.3 9.8 9.6 9.8  MG 2.2  --  1.8 2.0 2.1  PHOS  --   --  2.8  --   --    Liver Function Tests: Recent Labs  Lab 08/05/24 0644 08/06/24 1907 08/08/24 0523  AST 18 25 26   ALT 26 35 41  ALKPHOS 89 99 88  BILITOT 0.3 <0.2 <0.2  PROT 6.7 7.1 6.8  ALBUMIN 4.2 4.5 4.2   CBG: Recent Labs  Lab  08/08/24 0250 08/08/24 1021 08/08/24 2338 08/09/24 0807 08/09/24 2345  GLUCAP 117* 162* 106* 103* 126*    Discharge time spent: greater than 30 minutes.  Signed: Aimee Somerset, MD [facility] 08/10/2024 "

## 2024-08-10 NOTE — Significant Event (Signed)
 Rapid Response Event Note   Reason for Call :  Fall/ Unresponsive on the floor  Initial Focused Assessment:  Patient unresponsive on the floor in the bathroom. Per sitter patient was washing his hands and fell in the bathroom. Patient moved to bed by staff members. Patients vital signs stable. BP 137/77 HR 100 O2 99 on RA RR 14. Dr Cleatus at bedside patient placed in a C-Collar. Patient Has a history of PNES. During this admission patient has had 3 other episodes one also resulting in a fall. Patient Transported to CT and back to room.     0220  Checked in on patient. He is Awake but lethargic. He is oriented X4 able to follow commands. Patient expressed that this Episode felt like his previous episodes.   Plan of Care:  -CT Scan -vitals   Event Summary:   MD Notified: Dr Cleatus Call Upfz:7656 Arrival Upfz:7654 End Upfz:9984  Lesley LOISE Shams, RN

## 2024-08-11 DIAGNOSIS — T43222A Poisoning by selective serotonin reuptake inhibitors, intentional self-harm, initial encounter: Secondary | ICD-10-CM | POA: Diagnosis not present

## 2024-08-11 NOTE — TOC Transition Note (Signed)
 Transition of Care [facility]) - Discharge Note   Patient Details  Name: Vincent Frank MRN: [id number] Date of Birth: 11-16-2000  [facility]) CM/SW Contact:  Shasta DELENA Daring, RN Phone Number: 08/11/2024, 8:14 AM   Clinical Narrative:    Patient has been accepted for treatment by a facility in [facility]. Transportation arranged via the [facility] Department.   No additional TOC needs.  RNCM signing off   Final next level of care: Psychiatric Hospital Barriers to Discharge: Barriers Resolved   Patient Goals and CMS Choice            Discharge Placement                       Discharge Plan and Services Additional resources added to the After Visit Summary for                                       Social Drivers of Health (SDOH) Interventions SDOH Screenings   Food Insecurity: No Food Insecurity (08/03/2024)  Recent Concern: Food Insecurity - Food Insecurity Present (07/28/2024)   Received from [facility]  Housing: Unknown (08/03/2024)  Recent Concern: Housing - High Risk (07/28/2024)   Received from [facility]  Transportation Needs: No Transportation Needs (08/03/2024)  Recent Concern: Transportation Needs - Unmet Transportation Needs (07/13/2024)   Received from [facility]  Utilities: Not At Risk (08/03/2024)  Recent Concern: Utilities - High Risk (07/13/2024)   Received from [facility]  Alcohol Screen: Low Risk (06/03/2023)  Financial Resource Strain: Medium Risk (07/13/2024)   Received from [facility]  Physical Activity: Sufficiently Active (07/13/2024)   Received from [facility]  Social Connections: Patient Declined (08/03/2024)  Recent Concern: Social Connections - Moderately Isolated (07/13/2024)   Received from [facility]  Stress: Stress Concern Present (07/28/2024)   Received from [facility]  Tobacco Use: Low Risk (08/02/2024)  Health Literacy: Low Risk (07/13/2024)   Received from  [facility]     Readmission Risk Interventions     No data to display

## 2024-08-11 NOTE — Progress Notes (Signed)
 " Progress Note   Patient: Vincent Frank FMW:[id number] DOB: [birth year] DOA: 08/02/2024     6 DOS: the patient was seen and examined on 08/11/2024   Brief hospital course: JAMIL ARMWOOD is a 24 y.o. male with medical history significant of MDD, seizure disorder, presented with suicidal ideation and suicidal attempt with Wellbutrin and Benadryl  overdose.   Patient showed up in ED yesterday evening complaining of  feeling a lot of stress in life and took many pills of Wellbutrin and Benadryl  estimated time of ingestion was around 1700.  Patient was lethargic but oriented x 3.  No other complaints such as pain, no nauseous vomiting no headache or vision changes.  No fall at home.   ED Course: Vital signs are stable, showed QTc 683.  Poison Control Center was contacted who recommended IV hydration and supportive care and monitor for 24 hours.  Psychiatry consulted and patient was placed on IVC.  This morning patient reported feeling much better and denied any suicidal ideation.  AST 24 ALT 38 bicarb 22.     Assessment and Plan:   Seizure-like activity Psychogenic nonepileptic seizures  RRT called early a.m. 1/13 due to patient being found down in the bathroom.  Noted to have seizure-like activity with left gaze deviation and left hand tremors.  Received total 6 mg IV Ativan  and transferred to stepdown.   Neurology consulted.  Case discussed with Dr. Matthews.  These episodes are not electrical seizures and Ativan  is not indicated.  Depakote  started after conversation between neurology and psychiatry as a mood stabilizer and not an antiepileptic agent. Patient had another episode on 1/15 but is back to his baseline and has been cleared by neurology for discharge    SSRI overdose Benadryl  overdose Suicidal ideation and suicidal attempt Major depression disorder Neurologic exam relatively benign however does have some persistent tremors Continue IVC.   Continue one-to-one sitter.    Psychiatry following in consultation role.   Patient has an accepting facility in [place] . For discharge in a.m.     AKI Urinary retention Likely secondary to ingestion of anticholinergics Resolved Encourage p.o. intake and ambulation     Prolonged Qtc Tremors Repeat EKG with improved QTc within reference range.               Subjective: Sleeping but arouses easily  Physical Exam: Vitals:   08/11/24 0332 08/11/24 0800 08/11/24 0818 08/11/24 0929  BP: 127/77 127/77 127/77 124/68  Pulse: 91  91 68  Resp: 18  18 19   Temp: 98.2 F (36.8 C) 98 F (36.7 C)  98.7 F (37.1 C)  TempSrc:   Oral Oral  SpO2: 96%  96% 97%  Weight:      Height:       General exam: Sleeping but arouses easily Respiratory system: Lungs clear.  No work of breathing.  Room air Cardiovascular system: S1-S2, RRR, no murmurs, no pedal edema Gastrointestinal system:, Soft, NT/ND, normal bowel sounds Central nervous system:  No focal neurological deficits.  Able to move all extremities Extremities: Symmetric 5 x 5 power. Skin: No rashes, lesions or ulcers Psychiatry: Judgement and insight appear normal. Mood & affect blunted.    Data Reviewed: Within normal limits Labs reviewed  Family Communication: None  Disposition:  Patient class is not currently inpatient or observation. Update patient class prior to completing documentation   Planned Discharge Destination: discharge to inpatient behavioral health     Time spent: 50  minutes  Author: [employer]  Triana Coover, MD 08/11/2024 11:17 AM  For on call review [email or web address].  "

## 2024-08-11 NOTE — Progress Notes (Addendum)
 Report given to brooke, CHARITY FUNDRAISER at [employer]

## 2024-08-15 NOTE — Progress Notes (Signed)
 Follow up. Contacted pt by phone, no answer and left voicemail. SW plans to attempt to contact once more, then will close referral.   Damien Dawn, MSW Intern

## 2024-08-17 ENCOUNTER — Emergency Department (HOSPITAL_COMMUNITY)

## 2024-08-17 ENCOUNTER — Inpatient Hospital Stay (HOSPITAL_COMMUNITY)
Admission: EM | Admit: 2024-08-17 | Discharge: 2024-08-22 | DRG: 917 | Disposition: A | Discharge status: Other Acute Inpatient Hospital | Attending: Family Medicine | Admitting: Family Medicine

## 2024-08-17 DIAGNOSIS — F333 Major depressive disorder, recurrent, severe with psychotic symptoms: Secondary | ICD-10-CM | POA: Diagnosis present

## 2024-08-17 DIAGNOSIS — G9341 Metabolic encephalopathy: Secondary | ICD-10-CM | POA: Diagnosis present

## 2024-08-17 DIAGNOSIS — R079 Chest pain, unspecified: Secondary | ICD-10-CM | POA: Diagnosis not present

## 2024-08-17 DIAGNOSIS — R569 Unspecified convulsions: Secondary | ICD-10-CM

## 2024-08-17 DIAGNOSIS — T50901A Poisoning by unspecified drugs, medicaments and biological substances, accidental (unintentional), initial encounter: Secondary | ICD-10-CM | POA: Diagnosis present

## 2024-08-17 DIAGNOSIS — T68XXXA Hypothermia, initial encounter: Secondary | ICD-10-CM | POA: Diagnosis present

## 2024-08-17 DIAGNOSIS — M6282 Rhabdomyolysis: Secondary | ICD-10-CM | POA: Diagnosis not present

## 2024-08-17 DIAGNOSIS — Z9151 Personal history of suicidal behavior: Secondary | ICD-10-CM

## 2024-08-17 DIAGNOSIS — E66813 Obesity, class 3: Secondary | ICD-10-CM | POA: Diagnosis not present

## 2024-08-17 DIAGNOSIS — R0682 Tachypnea, not elsewhere classified: Secondary | ICD-10-CM | POA: Diagnosis present

## 2024-08-17 DIAGNOSIS — Z79899 Other long term (current) drug therapy: Secondary | ICD-10-CM

## 2024-08-17 DIAGNOSIS — R410 Disorientation, unspecified: Principal | ICD-10-CM

## 2024-08-17 DIAGNOSIS — T50902A Poisoning by unspecified drugs, medicaments and biological substances, intentional self-harm, initial encounter: Secondary | ICD-10-CM | POA: Diagnosis present

## 2024-08-17 DIAGNOSIS — Z6831 Body mass index (BMI) 31.0-31.9, adult: Secondary | ICD-10-CM

## 2024-08-17 DIAGNOSIS — R9431 Abnormal electrocardiogram [ECG] [EKG]: Secondary | ICD-10-CM | POA: Diagnosis not present

## 2024-08-17 DIAGNOSIS — F332 Major depressive disorder, recurrent severe without psychotic features: Secondary | ICD-10-CM | POA: Diagnosis present

## 2024-08-17 DIAGNOSIS — I10 Essential (primary) hypertension: Secondary | ICD-10-CM | POA: Diagnosis present

## 2024-08-17 DIAGNOSIS — X31XXXA Exposure to excessive natural cold, initial encounter: Secondary | ICD-10-CM

## 2024-08-17 DIAGNOSIS — R45851 Suicidal ideations: Secondary | ICD-10-CM

## 2024-08-17 DIAGNOSIS — D72828 Other elevated white blood cell count: Secondary | ICD-10-CM | POA: Diagnosis present

## 2024-08-17 DIAGNOSIS — T450X2A Poisoning by antiallergic and antiemetic drugs, intentional self-harm, initial encounter: Principal | ICD-10-CM | POA: Diagnosis present

## 2024-08-17 DIAGNOSIS — Z888 Allergy status to other drugs, medicaments and biological substances status: Secondary | ICD-10-CM

## 2024-08-17 DIAGNOSIS — F419 Anxiety disorder, unspecified: Secondary | ICD-10-CM | POA: Diagnosis present

## 2024-08-17 DIAGNOSIS — G928 Other toxic encephalopathy: Secondary | ICD-10-CM | POA: Diagnosis present

## 2024-08-17 DIAGNOSIS — F445 Conversion disorder with seizures or convulsions: Secondary | ICD-10-CM | POA: Diagnosis present

## 2024-08-17 DIAGNOSIS — E66811 Obesity, class 1: Secondary | ICD-10-CM | POA: Diagnosis present

## 2024-08-17 DIAGNOSIS — D509 Iron deficiency anemia, unspecified: Secondary | ICD-10-CM | POA: Diagnosis present

## 2024-08-17 DIAGNOSIS — T43222A Poisoning by selective serotonin reuptake inhibitors, intentional self-harm, initial encounter: Secondary | ICD-10-CM | POA: Diagnosis present

## 2024-08-17 LAB — URINE DRUG SCREEN
Amphetamines: NEGATIVE
Barbiturates: NEGATIVE
Benzodiazepines: POSITIVE — AB
Cocaine: NEGATIVE
Fentanyl: NEGATIVE
Methadone Scn, Ur: NEGATIVE
Opiates: NEGATIVE
Tetrahydrocannabinol: NEGATIVE

## 2024-08-17 LAB — OSMOLALITY: Osmolality: 301 mosm/kg — ABNORMAL HIGH (ref 275–295)

## 2024-08-17 LAB — COMPREHENSIVE METABOLIC PANEL WITH GFR
ALT: 39 U/L (ref 0–44)
AST: 106 U/L — ABNORMAL HIGH (ref 15–41)
Albumin: 5.1 g/dL — ABNORMAL HIGH (ref 3.5–5.0)
Alkaline Phosphatase: 89 U/L (ref 38–126)
Anion gap: 19 — ABNORMAL HIGH (ref 5–15)
BUN: 12 mg/dL (ref 6–20)
CO2: 20 mmol/L — ABNORMAL LOW (ref 22–32)
Calcium: 10 mg/dL (ref 8.9–10.3)
Chloride: 102 mmol/L (ref 98–111)
Creatinine, Ser: 1.19 mg/dL (ref 0.61–1.24)
GFR, Estimated: >60 mL/min
Glucose, Bld: 105 mg/dL — ABNORMAL HIGH (ref 70–99)
Potassium: 4.8 mmol/L (ref 3.5–5.1)
Sodium: 141 mmol/L (ref 135–145)
Total Bilirubin: 0.4 mg/dL (ref 0.0–1.2)
Total Protein: 8.5 g/dL — ABNORMAL HIGH (ref 6.5–8.1)

## 2024-08-17 LAB — CBC
HCT: 41.2 % (ref 39.0–52.0)
Hemoglobin: 13.4 g/dL (ref 13.0–17.0)
MCH: 24.4 pg — ABNORMAL LOW (ref 26.0–34.0)
MCHC: 32.5 g/dL (ref 30.0–36.0)
MCV: 75 fL — ABNORMAL LOW (ref 80.0–100.0)
Platelets: 245 K/uL (ref 150–400)
RBC: 5.49 MIL/uL (ref 4.22–5.81)
RDW: 14.4 % (ref 11.5–15.5)
WBC: 21.1 K/uL — ABNORMAL HIGH (ref 4.0–10.5)
nRBC: 0 % (ref 0.0–0.2)

## 2024-08-17 LAB — AMMONIA: Ammonia: 14 umol/L (ref 9–35)

## 2024-08-17 LAB — MAGNESIUM: Magnesium: 2.3 mg/dL (ref 1.7–2.4)

## 2024-08-17 LAB — ETHANOL: Alcohol, Ethyl (B): <15 mg/dL

## 2024-08-17 LAB — LACTIC ACID, PLASMA: Lactic Acid, Venous: 1.5 mmol/L (ref 0.5–1.9)

## 2024-08-17 LAB — CK: Total CK: 11116 U/L — ABNORMAL HIGH (ref 49–397)

## 2024-08-17 LAB — ACETAMINOPHEN LEVEL: Acetaminophen (Tylenol), Serum: <10 ug/mL — ABNORMAL LOW (ref 10–30)

## 2024-08-17 LAB — SALICYLATE LEVEL: Salicylate Lvl: <7 mg/dL — ABNORMAL LOW (ref 7.0–30.0)

## 2024-08-17 LAB — VALPROIC ACID LEVEL: Valproic Acid Lvl: 33 ug/mL — ABNORMAL LOW (ref 50–100)

## 2024-08-17 MED ORDER — ONDANSETRON HCL 4 MG/2ML IJ SOLN
4.0000 mg | Freq: Four times a day (QID) | INTRAMUSCULAR | Status: DC | PRN
  Administered 2024-08-17: 4 mg via INTRAVENOUS
  Filled 2024-08-17: qty 2

## 2024-08-17 MED ORDER — ONDANSETRON HCL 4 MG PO TABS: 4.0000 mg | ORAL_TABLET | Freq: Four times a day (QID) | ORAL | Status: DC | PRN

## 2024-08-17 MED ORDER — ACETAMINOPHEN 325 MG PO TABS
650.0000 mg | ORAL_TABLET | Freq: Four times a day (QID) | ORAL | Status: DC | PRN
  Administered 2024-08-21: 650 mg via ORAL
  Filled 2024-08-17: qty 2

## 2024-08-17 MED ORDER — SODIUM CHLORIDE 0.9 % IV BOLUS
1000.0000 mL | Freq: Once | INTRAVENOUS | Status: AC
  Administered 2024-08-17: 1000 mL via INTRAVENOUS

## 2024-08-17 MED ORDER — LACTATED RINGERS IV SOLN: INTRAVENOUS | Status: AC

## 2024-08-17 MED ORDER — ACETAMINOPHEN 650 MG RE SUPP: 650.0000 mg | Freq: Four times a day (QID) | RECTAL | Status: DC | PRN

## 2024-08-17 MED ORDER — ENOXAPARIN SODIUM 60 MG/0.6ML IJ SOSY
55.0000 mg | PREFILLED_SYRINGE | INTRAMUSCULAR | Status: DC
  Administered 2024-08-17 – 2024-08-21 (×5): 55 mg via SUBCUTANEOUS
  Filled 2024-08-17 (×5): qty 0.6

## 2024-08-17 MED ORDER — ACETAMINOPHEN 325 MG PO TABS
650.0000 mg | ORAL_TABLET | Freq: Once | ORAL | Status: AC
  Administered 2024-08-17: 650 mg via ORAL
  Filled 2024-08-17: qty 2

## 2024-08-17 NOTE — ED Notes (Addendum)
 Rectal temp obtained at 100.0 F this RN turned off the [employer] at this time.

## 2024-08-17 NOTE — H&P (Signed)
 " History and Physical    Patient: Vincent Frank FMW:[id number] DOB: January 30, 2001 DOA: 08/17/2024 DOS: the patient was seen and examined on 08/17/2024 PCP: [name], MD  Patient coming from: Home  Chief Complaint:  Chief Complaint  Patient presents with   Altered Mental Status   Cold Exposure   HPI: Vincent Frank is a 24 y.o. male with medical history significant of  MDD, seizure disorder who presents to the emergency department via EMS due to drug overdose.  At bedside, patient states that he overdosed on Benadryl  without being able to provide further history.  Rest of the history was obtained from EDP and ED medical record.  Per report, ED medical record states that patient's mother Elray) endorsed that patient was last seen yesterday after he got home from behavioral health facility.  He left last night without being sure if he left with any medication as there was no obvious missing medication at home. Parents were able to trace his location to a field in [place] on cell phone and police department (PD) was called, he was reported to be confused when seen by PD and was taken to the ED  Patient was recently admitted from 08/03/2023 to 08/12/2023 due to seizure-like activity, SSRI overdose, Benadryl  overdose, suicidal ideation and attempts by overdosing on Wellbutrin and Benadryl .   ED course In the Emergency Department, temperature was 95.62F and was provided with bair hugger. He was tachypneic, BP 151/2400 vital signs were within normal range.  Workup in the ED showed normal CBC except for WBC of 21.1 and MCV of 75.0.  BMP was normal except for bicarb of 20 and blood glucose of 105.  Albumin 5.1, AST 106, anion gap 19, salicylate, acetaminophen , ethanol level were undetectable.  Urine drug screen was positive for benzodiazepine.  Total CK 11,116 CT head without contrast showed no acute intracranial abnormality IV hydration was provided.    Review of Systems: As  mentioned in the history of present illness. All other systems reviewed and are negative. Past Medical History:  Diagnosis Date   Anxiety    Depression    Major depressive disorder    Seizures (HCC)    Past Surgical History:  Procedure Laterality Date   NO PAST SURGERIES     Social History:  reports that he has never smoked. He has never used smokeless tobacco. He reports that he does not currently use alcohol. He reports that he does not currently use drugs.  Allergies[1]  Family History  Problem Relation Age of Onset   Healthy Mother     Prior to Admission medications  Medication Sig Start Date End Date Taking? Authorizing Provider  divalproex  (DEPAKOTE ) 500 MG DR tablet Take 1 tablet (500 mg total) by mouth every 12 (twelve) hours. 08/10/24 09/09/24  Lanetta Lingo, MD  FLUoxetine  (PROZAC ) 20 MG capsule Take 3 capsules (60 mg total) by mouth daily for 14 days. Patient not taking: Reported on 05/31/2024 06/06/23 05/31/24  Johny Lot, MD  hydrOXYzine  (VISTARIL ) 25 MG capsule Take 25 mg by mouth every 8 (eight) hours as needed.    [provider]  OLANZapine  (ZYPREXA ) 5 MG tablet Take 10 mg by mouth at bedtime. 07/26/24   [provider]    Physical Exam: Vitals:   08/17/24 1822 08/17/24 1900 08/17/24 1930 08/17/24 2000  BP:  (!) 160/94 (!) 156/104 129/82  Pulse:  96 (!) 101 (!) 105  Resp:  (!) 21 (!) 22 (!) 36  Temp: 98.7 F (37.1  C)  98.9 F (37.2 C) 100 F (37.8 C)  TempSrc: Oral  Oral Rectal  SpO2:  99% 100% 100%  Weight:      Height:       General:  Awake and alert and oriented x3. Not in any acute distress.  HEENT: NCAT.  PERRLA. EOMI. Sclerae anicteric.  Moist mucosal membranes. Neck: Neck supple without lymphadenopathy. No carotid bruits. No masses palpated.  Cardiovascular: Regular rate with normal S1-S2 sounds. No murmurs, rubs or gallops auscultated. No JVD.  Respiratory: Tachypnea.  Clear breath sounds.  No accessory muscle  use. Abdomen: Soft, nontender, nondistended. Active bowel sounds. No masses or hepatosplenomegaly  Skin: No rashes, lesions, or ulcerations.  Dry, warm to touch. Musculoskeletal:  2+ dorsalis pedis and radial pulses. Good ROM.  No contractures  Psychiatric: Mood appropriate to current condition. Neurologic: Noted intermittent tremors strength is 5/5 x 4.  CN II - XII grossly intact.  Assessment and Plan: Benadryl  overdose Patient was recently admitted about 2 weeks ago due to Benadryl  and SSRI overdose EKG personally reviewed showed normal sinus rhythm at a rate of 97 bpm with QTc of 516 ms.  Poison control center was contacted by EDP and recommended IV hydration and supportive care and monitor for 24 hours.  Patient denies suicidal ideation Consider psych eval prior to discharge  Prolonged QT interval QTc 516 ms Continue EKG every 4 hours x 2 per poison control recommendation Avoid QT prolonging drugs Magnesium  2.3  Hypothermia-resolved  Leukocytosis possibly reactive WBC 21.1, no known acute infectious process at this time Continue to monitor WBC with morning labs  Rhabdomyolysis Total CK11,116 Continue IV hydration and continue to monitor CK level  Obesity class I (BMI 31.37) Diet and lifestyle modification  Low MCV MCV 75.0, iron studies will be done  Major depressive disorder Psych meds will be held at this time pending psych eval   Advance Care Planning: Full code  Consults: None  Family Communication: None at bedside  Severity of Illness: The appropriate patient status for this patient is OBSERVATION. Observation status is judged to be reasonable and necessary in order to provide the required intensity of service to ensure the patient's safety. The patient's presenting symptoms, physical exam findings, and initial radiographic and laboratory data in the context of their medical condition is felt to place them at decreased risk for further clinical deterioration.  Furthermore, it is anticipated that the patient will be medically stable for discharge from the hospital within 2 midnights of admission.   Author: Javoris Star, DO 08/17/2024 8:11 PM  For on call review [email or web address].      [1]  Allergies Allergen Reactions   Bee Venom Anaphylaxis   Gabapentin  Other (See Comments) and Anaphylaxis    Muscle twitching   Haldol [Haloperidol] Anaphylaxis and Other (See Comments)    Per father, dystonic reaction with  muscle contractures- also   Hornet Venom Anaphylaxis and Itching   Peanut-Containing Drug Products Anaphylaxis   Wasp Venom Anaphylaxis   Beeswax Swelling   "

## 2024-08-17 NOTE — ED Provider Notes (Addendum)
 " [place] EMERGENCY DEPARTMENT AT [facility] Provider Note   CSN: [id number] Arrival date & time: 08/17/24  1646     Patient presents with: Altered Mental Status and Cold Exposure   Vincent Frank is a 24 y.o. male.    Altered Mental Status Associated symptoms: no abdominal pain, no fever, no palpitations, no rash, no seizures and no vomiting    Patient was brought in by EMS.  Reportedly went missing last night.  Family was able to change his location.  Was found to be in [place]   Patient denies all complaints at this time.  Denies any headaches.  No fever no chills.  Denies IV drug abuse.  Denies other ingestion.  Denies SI or HI.  Denies overdose attempt prior to coming to ED today.  Falls.     Per Mother karen: History of severe MDD. Factitious disorder. Inpatient multiple times. Last was seen yesterday after got home from behavioral health facility. Patient left last night. Unclear if left with medication last night. No obvious missing medication at home.     Previous medical history reviewed : Patient last discharged from inpatient psychiatric facility on August 11, 2024.  SRI overdose.  Psychogenic nonepileptic seizure.  Wellbutrin and Benadryl  overdose.  Patient was admitted to the medicine team before admission to the psychiatric team.  Patient's also had multiple admissions in January as well as December and November as well as throughout this past year and is having psychiatric reasons and overdose attempts.   Prior to Admission medications  Medication Sig Start Date End Date Taking? Authorizing Provider  divalproex  (DEPAKOTE ) 500 MG DR tablet Take 1 tablet (500 mg total) by mouth every 12 (twelve) hours. 08/10/24 09/09/24  Lanetta Lingo, MD  FLUoxetine  (PROZAC ) 20 MG capsule Take 3 capsules (60 mg total) by mouth daily for 14 days. Patient not taking: Reported on 05/31/2024 06/06/23 05/31/24  Johny Lot, MD  hydrOXYzine  (VISTARIL ) 25 MG  capsule Take 25 mg by mouth every 8 (eight) hours as needed.    [provider]  OLANZapine  (ZYPREXA ) 5 MG tablet Take 10 mg by mouth at bedtime. 07/26/24   [provider]    Allergies: Bee venom, Gabapentin , Haldol [haloperidol], Hornet venom, Peanut-containing drug products, Wasp venom, and Beeswax    Review of Systems  Constitutional:  Negative for chills and fever.  HENT:  Negative for ear pain and sore throat.   Eyes:  Negative for pain and visual disturbance.  Respiratory:  Negative for cough and shortness of breath.   Cardiovascular:  Negative for chest pain and palpitations.  Gastrointestinal:  Negative for abdominal pain and vomiting.  Genitourinary:  Negative for dysuria and hematuria.  Musculoskeletal:  Negative for arthralgias and back pain.  Skin:  Negative for color change and rash.  Neurological:  Negative for seizures and syncope.  All other systems reviewed and are negative.   Updated Vital Signs BP 129/82   Pulse (!) 105   Temp 100 F (37.8 C) (Rectal)   Resp (!) 36   Ht 6' 3 (1.905 m)   Wt 113.9 kg   SpO2 100%   BMI 31.37 kg/m   Physical Exam Vitals and nursing note reviewed.  Constitutional:      General: He is not in acute distress.    Appearance: He is well-developed.  HENT:     Head: Normocephalic and atraumatic.  Eyes:     Conjunctiva/sclera: Conjunctivae normal.  Cardiovascular:     Rate  and Rhythm: Normal rate and regular rhythm.     Heart sounds: No murmur heard. Pulmonary:     Effort: Pulmonary effort is normal. No respiratory distress.     Breath sounds: Normal breath sounds.  Abdominal:     Palpations: Abdomen is soft.     Tenderness: There is no abdominal tenderness.  Musculoskeletal:        General: No swelling.     Cervical back: Neck supple.  Skin:    General: Skin is warm and dry.     Capillary Refill: Capillary refill takes less than 2 seconds.  Neurological:     Mental Status: He is alert.     Comments:  Able to tell me his name but not time or place   No Meningismus signs.  Able to touch chin to chest.  Obvious clonus or hyperreflexia.  Pulse dilated.  Equal and reactive.  Psychiatric:        Mood and Affect: Mood normal.     (all labs ordered are listed, but only abnormal results are displayed) Labs Reviewed  COMPREHENSIVE METABOLIC PANEL WITH GFR - Abnormal; Notable for the following components:      Result Value   CO2 20 (*)    Glucose, Bld 105 (*)    Total Protein 8.5 (*)    Albumin 5.1 (*)    AST 106 (*)    Anion gap 19 (*)    All other components within normal limits  CBC - Abnormal; Notable for the following components:   WBC 21.1 (*)    MCV 75.0 (*)    MCH 24.4 (*)    All other components within normal limits  URINE DRUG SCREEN - Abnormal; Notable for the following components:   Benzodiazepines POSITIVE (*)    All other components within normal limits  ACETAMINOPHEN  LEVEL - Abnormal; Notable for the following components:   Acetaminophen  (Tylenol ), Serum <10 (*)    All other components within normal limits  SALICYLATE LEVEL - Abnormal; Notable for the following components:   Salicylate Lvl <7.0 (*)    All other components within normal limits  VALPROIC  ACID LEVEL - Abnormal; Notable for the following components:   Valproic  Acid Lvl 33 (*)    All other components within normal limits  CK - Abnormal; Notable for the following components:   Total CK 11,116 (*)    All other components within normal limits  ETHANOL  LACTIC ACID, PLASMA  AMMONIA   MAGNESIUM   ETHYLENE GLYCOL  OSMOLALITY    EKG: EKG Interpretation Date/Time:  Thursday August 17 2024 17:57:15 EST Ventricular Rate:  97 PR Interval:  165 QRS Duration:  112 QT Interval:  406 QTC Calculation: 516 R Axis:   71  Text Interpretation: Sinus rhythm Incomplete right bundle branch block Prolonged QT interval Confirmed by Simon Rea [phone]) on 08/17/2024 6:20:07 PM  Radiology: CT Head Wo  Contrast Result Date: 08/17/2024 EXAM: CT HEAD WITHOUT CONTRAST 08/17/2024 06:13:38 PM TECHNIQUE: CT of the head was performed without the administration of intravenous contrast. Automated exposure control, iterative reconstruction, and/or weight based adjustment of the mA/kV was utilized to reduce the radiation dose to as low as reasonably achievable. COMPARISON: None available. CLINICAL HISTORY: AMS Altered mental status. FINDINGS: BRAIN AND VENTRICLES: No acute hemorrhage. No evidence of acute infarct. No hydrocephalus. No extra-axial collection. No mass effect or midline shift. ORBITS: No acute abnormality. SINUSES: No acute abnormality. SOFT TISSUES AND SKULL: No acute soft tissue abnormality. No skull fracture. IMPRESSION: 1. No  acute intracranial abnormality. Electronically signed by: Franky Crease MD 08/17/2024 06:18 PM EST RP Workstation: [id number]     Procedures   Medications Ordered in the ED  sodium chloride  0.9 % bolus 1,000 mL (has no administration in time range)  sodium chloride  0.9 % bolus 1,000 mL (1,000 mLs Intravenous New Bag/Given 08/17/24 1821)  acetaminophen  (TYLENOL ) tablet 650 mg (650 mg Oral Given 08/17/24 2010)    Clinical Course as of 08/17/24 2033  Thu Aug 17, 2024  1823 Per [place] Poison control: possible SSRI or anticholinergic.   Do not need to treat toxic alcohol at this time.   Add on CK   Ammonia  and valpric acid  [TL]  1831 If extend QRS 120-140 amps of bicarb x 2 and repeat EKG.  [TL]  1831 Repeat EKG in four hours [TL]    Clinical Course User Index [TL] Simon Lavonia SAILOR, MD                                 Medical Decision Making Amount and/or Complexity of Data Reviewed Labs: ordered. Radiology: ordered.  Risk OTC drugs. Decision regarding hospitalization.     HPI:   Patient was brought in by EMS.  Reportedly went missing last night.  Family was able to change his location.  Was found to be in [place]   Patient denies all complaints at  this time.  Denies any headaches.  No fever no chills.  Denies IV drug abuse.  Denies other ingestion.  Denies SI or HI.  Denies overdose attempt prior to coming to ED today.  Falls.     Per Mother karen: History of severe MDD. Factitious disorder. Inpatient multiple times. Last was seen yesterday after got home from behavioral health facility. Patient left last night. Unclear if left with medication last night. No obvious missing medication at home.     Previous medical history reviewed : Patient last discharged from inpatient psychiatric facility on August 11, 2024.  SRI overdose.  Psychogenic nonepileptic seizure.  Wellbutrin and Benadryl  overdose.  Patient was admitted to the medicine team before admission to the psychiatric team.  Patient's also had multiple admissions in January as well as December and November as well as throughout this past year and is having psychiatric reasons and overdose attempts.  MDM:   Upon examination, patient slightly hypothermic but otherwise vital signs stable.  In terms of patient ultimately status.  He seems very much to be medication induced.  Seems to be consistent with patient's previous presentations for overdose attempts in the setting of SI.  Unclear what patient took at this point in time.  Patient does have dilation of pupils.  Confusion.  Some slight hypertension as well.  This would be consistent for possible anticholinergic.  his body temperature was a little low when he first arrived but I think this is probably because of exposure being outside.  Patient quickly responded to bear hugger.    Spoke to poison control.  If QRS expands to 100 2240 then recommend bicarb x 2 and repeat EKG.  EKG every 4 hours x 2.  For like is likely SSRI versus anticholinergic overdose.  Recommend CK levels as well.  Ammonia  and valproic  levels.  This is long been completed.  CK levels elevated.  Patient received 2 L of fluid here in the ED.  Kidney is normal at this  time.  CT head obtained.  No acute  pathology seen.  Anion gap.  Unclear etiology of anion gap.  Ethanol negative.  Lactic acid normal.  No uremia.  Will add on serum awesome's as well as ethylene glycol.  Do not think there needs to be any obvious coverage with fomepizole at this time.  Poison control agrees.  Inpatient team will follow the ethylene glycol.  Once again, no meningeal signs.  Normal chin to chest.  Negative Kernig's and Brudzinski's.  I think this leukocytosis is likely drug-induced.  No concerns for infectious etiology driving patient's altered mental status at this time.  I think the risk outweigh benefits in terms of LP.  IVC paperwork has been completed in the setting of concern for danger to himself given repeated prior attempts of SI.   EKG Interpreted by Me: sinus    Cardiac Tele Interpreted by Me: sinus    I have independently interpreted the CT  images and agree with the radiologist finding   Social Determinant of Health: repeated SI    Disposition and Follow Up: admit       Final diagnoses:  Disorientation  Non-traumatic rhabdomyolysis    ED Discharge Orders     None          Simon Lavonia SAILOR, MD 08/17/24 2033    Simon Lavonia SAILOR, MD 08/17/24 2033  "

## 2024-08-17 NOTE — ED Triage Notes (Signed)
 Pt arrives via [facility]. Reportedly pt went missing last night and parents who live in [place] stated that they pinged pt's location to a field in [place]. PD was called and they found him confused in a field. Pt unclear of how he got there. Pt disoriented to place and situation. Pt with recent SI attempts. Pupils are 8 mm and reactive

## 2024-08-17 NOTE — ED Notes (Addendum)
 Pt endorses that he does not know why he overdosed and can not remember how he got into the field. Pt denies wanting to kill himself of any one else at this time.

## 2024-08-17 NOTE — ED Notes (Signed)
 Pt having AVH while I was in the room. Having conversation with someone and states that he sees them there although no one else in the room.

## 2024-08-17 NOTE — ED Notes (Signed)
 IVC paperwork in chart

## 2024-08-18 DIAGNOSIS — Z888 Allergy status to other drugs, medicaments and biological substances status: Secondary | ICD-10-CM | POA: Diagnosis not present

## 2024-08-18 DIAGNOSIS — R410 Disorientation, unspecified: Secondary | ICD-10-CM | POA: Diagnosis present

## 2024-08-18 DIAGNOSIS — T68XXXA Hypothermia, initial encounter: Secondary | ICD-10-CM | POA: Diagnosis present

## 2024-08-18 DIAGNOSIS — T43222A Poisoning by selective serotonin reuptake inhibitors, intentional self-harm, initial encounter: Secondary | ICD-10-CM | POA: Diagnosis present

## 2024-08-18 DIAGNOSIS — E66811 Obesity, class 1: Secondary | ICD-10-CM | POA: Diagnosis present

## 2024-08-18 DIAGNOSIS — I10 Essential (primary) hypertension: Secondary | ICD-10-CM | POA: Diagnosis present

## 2024-08-18 DIAGNOSIS — Z9151 Personal history of suicidal behavior: Secondary | ICD-10-CM | POA: Diagnosis not present

## 2024-08-18 DIAGNOSIS — F333 Major depressive disorder, recurrent, severe with psychotic symptoms: Secondary | ICD-10-CM | POA: Diagnosis present

## 2024-08-18 DIAGNOSIS — G928 Other toxic encephalopathy: Secondary | ICD-10-CM | POA: Diagnosis present

## 2024-08-18 DIAGNOSIS — R45851 Suicidal ideations: Secondary | ICD-10-CM | POA: Diagnosis not present

## 2024-08-18 DIAGNOSIS — G9341 Metabolic encephalopathy: Secondary | ICD-10-CM | POA: Diagnosis not present

## 2024-08-18 DIAGNOSIS — Z79899 Other long term (current) drug therapy: Secondary | ICD-10-CM | POA: Diagnosis not present

## 2024-08-18 DIAGNOSIS — X31XXXA Exposure to excessive natural cold, initial encounter: Secondary | ICD-10-CM | POA: Diagnosis not present

## 2024-08-18 DIAGNOSIS — T50901A Poisoning by unspecified drugs, medicaments and biological substances, accidental (unintentional), initial encounter: Secondary | ICD-10-CM | POA: Diagnosis not present

## 2024-08-18 DIAGNOSIS — D509 Iron deficiency anemia, unspecified: Secondary | ICD-10-CM | POA: Diagnosis present

## 2024-08-18 DIAGNOSIS — F419 Anxiety disorder, unspecified: Secondary | ICD-10-CM | POA: Diagnosis present

## 2024-08-18 DIAGNOSIS — D72828 Other elevated white blood cell count: Secondary | ICD-10-CM | POA: Diagnosis present

## 2024-08-18 DIAGNOSIS — T450X2A Poisoning by antiallergic and antiemetic drugs, intentional self-harm, initial encounter: Secondary | ICD-10-CM | POA: Diagnosis present

## 2024-08-18 DIAGNOSIS — R079 Chest pain, unspecified: Secondary | ICD-10-CM | POA: Diagnosis not present

## 2024-08-18 DIAGNOSIS — R0682 Tachypnea, not elsewhere classified: Secondary | ICD-10-CM | POA: Diagnosis present

## 2024-08-18 DIAGNOSIS — T50902A Poisoning by unspecified drugs, medicaments and biological substances, intentional self-harm, initial encounter: Secondary | ICD-10-CM | POA: Diagnosis not present

## 2024-08-18 DIAGNOSIS — F332 Major depressive disorder, recurrent severe without psychotic features: Secondary | ICD-10-CM | POA: Diagnosis not present

## 2024-08-18 DIAGNOSIS — F445 Conversion disorder with seizures or convulsions: Secondary | ICD-10-CM | POA: Diagnosis present

## 2024-08-18 DIAGNOSIS — Z6831 Body mass index (BMI) 31.0-31.9, adult: Secondary | ICD-10-CM | POA: Diagnosis not present

## 2024-08-18 DIAGNOSIS — M6282 Rhabdomyolysis: Secondary | ICD-10-CM | POA: Diagnosis present

## 2024-08-18 DIAGNOSIS — R9431 Abnormal electrocardiogram [ECG] [EKG]: Secondary | ICD-10-CM | POA: Diagnosis present

## 2024-08-18 LAB — COMPREHENSIVE METABOLIC PANEL WITH GFR
ALT: 41 U/L (ref 0–44)
AST: 214 U/L — ABNORMAL HIGH (ref 15–41)
Albumin: 4.3 g/dL (ref 3.5–5.0)
Alkaline Phosphatase: 71 U/L (ref 38–126)
Anion gap: 16 — ABNORMAL HIGH (ref 5–15)
BUN: 15 mg/dL (ref 6–20)
CO2: 21 mmol/L — ABNORMAL LOW (ref 22–32)
Calcium: 9.1 mg/dL (ref 8.9–10.3)
Chloride: 106 mmol/L (ref 98–111)
Creatinine, Ser: 1.29 mg/dL — ABNORMAL HIGH (ref 0.61–1.24)
GFR, Estimated: >60 mL/min
Glucose, Bld: 83 mg/dL (ref 70–99)
Potassium: 3.8 mmol/L (ref 3.5–5.1)
Sodium: 143 mmol/L (ref 135–145)
Total Bilirubin: 0.4 mg/dL (ref 0.0–1.2)
Total Protein: 6.9 g/dL (ref 6.5–8.1)

## 2024-08-18 LAB — CBC
HCT: 35.2 % — ABNORMAL LOW (ref 39.0–52.0)
Hemoglobin: 11.3 g/dL — ABNORMAL LOW (ref 13.0–17.0)
MCH: 24.6 pg — ABNORMAL LOW (ref 26.0–34.0)
MCHC: 32.1 g/dL (ref 30.0–36.0)
MCV: 76.5 fL — ABNORMAL LOW (ref 80.0–100.0)
Platelets: 179 K/uL (ref 150–400)
RBC: 4.6 MIL/uL (ref 4.22–5.81)
RDW: 14.9 % (ref 11.5–15.5)
WBC: 12.5 K/uL — ABNORMAL HIGH (ref 4.0–10.5)
nRBC: 0 % (ref 0.0–0.2)

## 2024-08-18 LAB — IRON AND TIBC
Iron: 58 ug/dL (ref 45–182)
Saturation Ratios: 15 % — ABNORMAL LOW (ref 17.9–39.5)
TIBC: 382 ug/dL (ref 250–450)
UIBC: 324 ug/dL

## 2024-08-18 LAB — MAGNESIUM: Magnesium: 2.2 mg/dL (ref 1.7–2.4)

## 2024-08-18 LAB — ETHYLENE GLYCOL: Ethylene Glycol Lvl: <5 mg/dL

## 2024-08-18 LAB — PHOSPHORUS: Phosphorus: 6.8 mg/dL — ABNORMAL HIGH (ref 2.5–4.6)

## 2024-08-18 LAB — FERRITIN: Ferritin: 24 ng/mL (ref 24–336)

## 2024-08-18 LAB — CK: Total CK: >20000 U/L — ABNORMAL HIGH (ref 49–397)

## 2024-08-18 MED ORDER — CHLORHEXIDINE GLUCONATE CLOTH 2 % EX PADS
6.0000 | MEDICATED_PAD | Freq: Every day | CUTANEOUS | Status: DC
  Administered 2024-08-19: 6 via TOPICAL

## 2024-08-18 MED ORDER — LORAZEPAM 2 MG/ML IJ SOLN
1.0000 mg | Freq: Four times a day (QID) | INTRAMUSCULAR | Status: DC | PRN
  Administered 2024-08-20 – 2024-08-21 (×2): 1 mg via INTRAVENOUS
  Filled 2024-08-18 (×2): qty 1

## 2024-08-18 MED ORDER — SODIUM CHLORIDE 0.45 % IV SOLN
INTRAVENOUS | Status: AC
  Filled 2024-08-18 (×2): qty 75

## 2024-08-18 MED ORDER — LACTATED RINGERS IV BOLUS
500.0000 mL | Freq: Once | INTRAVENOUS | Status: AC
  Administered 2024-08-18: 500 mL via INTRAVENOUS

## 2024-08-18 NOTE — Progress Notes (Signed)
 Spoke with poison control regarding patient, recommendation is to replace patients potassium, continue EKG monitoring, and fluids. Dr. Rendall made aware.
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
## 2024-08-18 NOTE — Progress Notes (Signed)
 " PROGRESS NOTE  Vincent Frank, is a 24 y.o. male, DOB - 2001/03/05, FMW:[id number]  Admit date - 08/17/2024   Admitting Physician Marites Nath Pearlean, MD  Outpatient Primary MD for the patient is [name], MD  LOS - 0  Chief Complaint  Patient presents with   Altered Mental Status   Cold Exposure      Brief Narrative:  24 y.o. male with medical history significant of  MDD, seizure disorder who was admitted on 08/17/2021 with concerns about drug overdose.  --Patient was recently admitted from 08/03/2023 to 08/12/2023 due to seizure-like activity, SSRI overdose, Benadryl  overdose, suicidal ideation and attempts by overdosing on Wellbutrin and Benadryl .    -Assessment and Plan: 1) acute metabolic encephalopathy--- UDS positive for benzos -- May continue as needed lorazepam  - Supportive care  2) rhabdomyolysis--CK trending up - Start IV bicarb drip - Encourage adequate oral intake and avoid dehydration  3)Seizure-like activity Psychogenic nonepileptic seizures --- Previously evaluated by neurology -Depakote  level at this time is 61 which is subtherapeutic - May restart Depakote  at discharge  4)SSRI overdose, Benadryl  overdose--- control input appreciated - Monitor electrolytes and EKG/QTc --  5)MDD--history of suicidal ideation attempts in the past - Patient denies suicidal ideation at this - Continue one-to-one monitoring/sitter -- Telepsych consult once medically cleared - PTA Prozac  and Depakote  on hold -- Patient was recently sent to a psychiatric facility in [place]    6) mild chronic anemia--- stable, -MCV and MCH are low -Ferritin and iron studies are borderline low -Give iron supplementation - No bleeding concerns  7)Leukocytosis--- WBC 21.1 >>12.5 --- no evidence of acute infection - Suspect this is reactive   Status is: Inpatient   Disposition: The patient is from: Home              Anticipated d/c is to: Home              Anticipated  d/c date is: 2 days              Patient currently is not medically stable to d/c. Barriers: Not Clinically Stable-   Code Status :  -  Code Status: Full Code   Family Communication:    NA (patient is alert, awake and coherent)   DVT Prophylaxis  :   - SCDs  SCDs Start: 08/17/24 2052   Lab Results  Component Value Date   PLT 179 08/18/2024    Inpatient Medications  Scheduled Meds:  [START ON 08/19/2024] Chlorhexidine  Gluconate Cloth  6 each Topical Q0600   enoxaparin  (LOVENOX ) injection  55 mg Subcutaneous Q24H   Continuous Infusions:  sodium bicarbonate  75 mEq in sodium chloride  0.45 % 1,075 mL infusion 83 mL/hr at 08/18/24 1529   PRN Meds:.acetaminophen  **OR** acetaminophen , LORazepam , ondansetron  **OR** ondansetron  (ZOFRAN ) IV   Anti-infectives (From admission, onward)    None         Subjective: Vincent Frank today has no fevers, no emesis,  No chest pain,    - One-to-one sitter at bedside - Cooperative  Objective: Vitals:   08/18/24 1200 08/18/24 1245 08/18/24 1305 08/18/24 1419  BP: (!) 103/51 105/60  124/70  Pulse: 87 82  83  Resp: (!) 22 (!) 25  20  Temp:   98.6 F (37 C) 97.8 F (36.6 C)  TempSrc:   Oral Oral  SpO2: 96% 97%  97%  Weight:      Height:        Intake/Output Summary (Last 24 hours) at 08/18/2024  1818 Last data filed at 08/18/2024 1500 Gross per 24 hour  Intake 360 ml  Output 1500 ml  Net -1140 ml   Filed Weights   08/17/24 1658  Weight: 113.9 kg    Physical Exam Gen:- Awake Alert, cooperative HEENT:- [place].AT, No sclera icterus Neck-Supple Neck,No JVD,.  Lungs-  CTAB , fair symmetrical air movement CV- S1, S2 normal, regular  Abd-  +ve B.Sounds, Abd Soft, No tenderness,    Extremity/Skin:- No  edema, pedal pulses present  Psych-affect is anxious, oriented x3 Neuro-no new focal deficits, mild tremors  Data Reviewed: I have personally reviewed following labs and imaging studies  CBC: Recent Labs  Lab  08/17/24 1709 08/18/24 0452  WBC 21.1* 12.5*  HGB 13.4 11.3*  HCT 41.2 35.2*  MCV 75.0* 76.5*  PLT 245 179   Basic Metabolic Panel: Recent Labs  Lab 08/17/24 1709 08/17/24 1850 08/18/24 0452  NA 141  --  143  K 4.8  --  3.8  CL 102  --  106  CO2 20*  --  21*  GLUCOSE 105*  --  83  BUN 12  --  15  CREATININE 1.19  --  1.29*  CALCIUM 10.0  --  9.1  MG  --  2.3 2.2  PHOS  --   --  6.8*   GFR: Estimated Creatinine Clearance: 123.4 mL/min (A) (by C-G formula based on SCr of 1.29 mg/dL (H)). Liver Function Tests: Recent Labs  Lab 08/17/24 1709 08/18/24 0452  AST 106* 214*  ALT 39 41  ALKPHOS 89 71  BILITOT 0.4 0.4  PROT 8.5* 6.9  ALBUMIN 5.1* 4.3   Cardiac Enzymes: Recent Labs  Lab 08/17/24 1850 08/18/24 0452  CKTOTAL 11,116* >20,000*   Recent Results (from the past 240 hours)  Resp panel by RT-PCR (RSV, Flu A&B, Covid)     Status: None   Collection Time: 08/10/24 10:00 AM  Result Value Ref Range Status   SARS Coronavirus 2 by RT PCR NEGATIVE NEGATIVE Final    Comment: (NOTE) SARS-CoV-2 target nucleic acids are NOT DETECTED.  The SARS-CoV-2 RNA is generally detectable in upper respiratory specimens during the acute phase of infection. The lowest concentration of SARS-CoV-2 viral copies this assay can detect is 138 copies/mL. A negative result does not preclude SARS-Cov-2 infection and should not be used as the sole basis for treatment or other patient management decisions. A negative result may occur with  improper specimen collection/handling, submission of specimen other than nasopharyngeal swab, presence of viral mutation(s) within the areas targeted by this assay, and inadequate number of viral copies(<138 copies/mL). A negative result must be combined with clinical observations, patient history, and epidemiological information. The expected result is Negative.  Fact Sheet for Patients:  bloggercourse.com  Fact Sheet for  Healthcare Providers:  seriousbroker.it  This test is no t yet approved or cleared by the [place]  FDA and  has been authorized for detection and/or diagnosis of SARS-CoV-2 by FDA under an Emergency Use Authorization (EUA). This EUA will remain  in effect (meaning this test can be used) for the duration of the COVID-19 declaration under Section 564(b)(1) of the Act, 21 U.S.C.section 360bbb-3(b)(1), unless the authorization is terminated  or revoked sooner.       Influenza A by PCR NEGATIVE NEGATIVE Final   Influenza B by PCR NEGATIVE NEGATIVE Final    Comment: (NOTE) The Xpert Xpress SARS-CoV-2/FLU/RSV plus assay is intended as an aid in the diagnosis of influenza from Nasopharyngeal swab  specimens and should not be used as a sole basis for treatment. Nasal washings and aspirates are unacceptable for Xpert Xpress SARS-CoV-2/FLU/RSV testing.  Fact Sheet for Patients: bloggercourse.com  Fact Sheet for Healthcare Providers: seriousbroker.it  This test is not yet approved or cleared by the [place]  FDA and has been authorized for detection and/or diagnosis of SARS-CoV-2 by FDA under an Emergency Use Authorization (EUA). This EUA will remain in effect (meaning this test can be used) for the duration of the COVID-19 declaration under Section 564(b)(1) of the Act, 21 U.S.C. section 360bbb-3(b)(1), unless the authorization is terminated or revoked.     Resp Syncytial Virus by PCR NEGATIVE NEGATIVE Final    Comment: (NOTE) Fact Sheet for Patients: bloggercourse.com  Fact Sheet for Healthcare Providers: seriousbroker.it  This test is not yet approved or cleared by the [place]  FDA and has been authorized for detection and/or diagnosis of SARS-CoV-2 by FDA under an Emergency Use Authorization (EUA). This EUA will remain in effect (meaning this  test can be used) for the duration of the COVID-19 declaration under Section 564(b)(1) of the Act, 21 U.S.C. section 360bbb-3(b)(1), unless the authorization is terminated or revoked.  Performed at [facility], [address] [place], [place] [address]     Radiology Studies: CT Head Wo Contrast Result Date: 08/17/2024 EXAM: CT HEAD WITHOUT CONTRAST 08/17/2024 06:13:38 PM TECHNIQUE: CT of the head was performed without the administration of intravenous contrast. Automated exposure control, iterative reconstruction, and/or weight based adjustment of the mA/kV was utilized to reduce the radiation dose to as low as reasonably achievable. COMPARISON: None available. CLINICAL HISTORY: AMS Altered mental status. FINDINGS: BRAIN AND VENTRICLES: No acute hemorrhage. No evidence of acute infarct. No hydrocephalus. No extra-axial collection. No mass effect or midline shift. ORBITS: No acute abnormality. SINUSES: No acute abnormality. SOFT TISSUES AND SKULL: No acute soft tissue abnormality. No skull fracture. IMPRESSION: 1. No acute intracranial abnormality. Electronically signed by: Franky Crease MD 08/17/2024 06:18 PM EST RP Workstation: [id number]   Scheduled Meds:  [START ON 08/19/2024] Chlorhexidine  Gluconate Cloth  6 each Topical Q0600   enoxaparin  (LOVENOX ) injection  55 mg Subcutaneous Q24H   Continuous Infusions:  sodium bicarbonate  75 mEq in sodium chloride  0.45 % 1,075 mL infusion 83 mL/hr at 08/18/24 1529    LOS: 0 days   Rendall Carwin M.D on 08/18/2024 at 6:18 PM  Go to [email or web address] - for contact info  [facility] - Office  [phone]  If 7PM-7AM, please contact night-coverage [email or web address] 08/18/2024, 6:18 PM   "

## 2024-08-18 NOTE — ED Notes (Signed)
 This RN made MD Adefeso aware that the pts WBC is 12.5 awaiting orders.

## 2024-08-18 NOTE — Plan of Care (Signed)

## 2024-08-18 NOTE — ED Notes (Signed)
 Pt saw ED security and asked if he was there for the pt. Nurse stated  no, he is here to protect the nurses and he is here everyday. Pt stated  I don't believe you.

## 2024-08-18 NOTE — ED Notes (Signed)
 This RN made MD Adefeso aware that several of the pts labs came back abnormal.

## 2024-08-18 NOTE — ED Notes (Signed)
 Pt believes security staff in ED is here to charge him with drugs due to yesterday. Nurse replied , no , why do you think that?. 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

## 2024-08-18 NOTE — BH Assessment (Signed)
 Patient was deferred to IRIS for a telepsych assessment. The assigned care coordinator will provide updates regarding the scheduling of the assessment. IRIS coordinator can be reached at [phone] for further information on the timing of the telepsych evaluation.

## 2024-08-18 NOTE — Progress Notes (Signed)
" °  Transition of Care (TOC) Screening Note   Patient Details  Name: Vincent Frank Date of Birth: April 01, 2001   Transition of Care [facility]) CM/SW Contact:    Hoy DELENA Bigness, LCSW Phone Number: 08/18/2024, 10:25 AM    Transition of Care Department [facility]) has reviewed patient and no TOC needs have been identified at this time. We will continue to monitor patient advancement through interdisciplinary progression rounds. If new patient transition needs arise, please place a TOC consult.    08/18/24 1024  TOC Brief Assessment  Insurance and Status Reviewed  Patient has primary care physician Yes  Home environment has been reviewed From home w/ parents  Prior level of function: Independent  Prior/Current Home Services No current home services  Social Drivers of Health Review SDOH reviewed no interventions necessary  Readmission risk has been reviewed Yes  Transition of care needs no transition of care needs at this time    "

## 2024-08-18 NOTE — ED Notes (Addendum)
 Poison control called and asked that the EKG's be ordered for every 6 hours until the QTC is less than 500, they said they would like the potassium at 4, and Mag at 2. This RN made MD Adefeso aware of the this information. Awaiting orders.

## 2024-08-18 NOTE — ED Notes (Signed)
 Attempted report

## 2024-08-18 NOTE — ED Notes (Signed)
 This RN completed the [phone] EKG and made MD Adefeso aware that the EKG was exported into the pts chart.

## 2024-08-19 DIAGNOSIS — T50901A Poisoning by unspecified drugs, medicaments and biological substances, accidental (unintentional), initial encounter: Secondary | ICD-10-CM | POA: Diagnosis not present

## 2024-08-19 DIAGNOSIS — G9341 Metabolic encephalopathy: Secondary | ICD-10-CM | POA: Diagnosis not present

## 2024-08-19 LAB — URINALYSIS, ROUTINE W REFLEX MICROSCOPIC
Bilirubin Urine: NEGATIVE
Glucose, UA: NEGATIVE mg/dL
Hgb urine dipstick: NEGATIVE
Ketones, ur: NEGATIVE mg/dL
Nitrite: NEGATIVE
Protein, ur: NEGATIVE mg/dL
Specific Gravity, Urine: 1.015 (ref 1.005–1.030)
pH: 8 (ref 5.0–8.0)

## 2024-08-19 LAB — MRSA NEXT GEN BY PCR, NASAL: MRSA by PCR Next Gen: NOT DETECTED

## 2024-08-19 LAB — URINE DRUG SCREEN
Amphetamines: NEGATIVE
Barbiturates: NEGATIVE
Benzodiazepines: NEGATIVE
Cocaine: NEGATIVE
Fentanyl: NEGATIVE
Methadone Scn, Ur: NEGATIVE
Opiates: NEGATIVE
Tetrahydrocannabinol: NEGATIVE

## 2024-08-19 LAB — CBC
HCT: 33.9 % — ABNORMAL LOW (ref 39.0–52.0)
Hemoglobin: 10.6 g/dL — ABNORMAL LOW (ref 13.0–17.0)
MCH: 24.5 pg — ABNORMAL LOW (ref 26.0–34.0)
MCHC: 31.3 g/dL (ref 30.0–36.0)
MCV: 78.5 fL — ABNORMAL LOW (ref 80.0–100.0)
Platelets: 168 10*3/uL (ref 150–400)
RBC: 4.32 MIL/uL (ref 4.22–5.81)
RDW: 14.5 % (ref 11.5–15.5)
WBC: 6.7 10*3/uL (ref 4.0–10.5)
nRBC: 0 % (ref 0.0–0.2)

## 2024-08-19 LAB — COMPREHENSIVE METABOLIC PANEL WITH GFR
ALT: 44 U/L (ref 0–44)
AST: 232 U/L — ABNORMAL HIGH (ref 15–41)
Albumin: 4 g/dL (ref 3.5–5.0)
Alkaline Phosphatase: 66 U/L (ref 38–126)
Anion gap: 7 (ref 5–15)
BUN: 14 mg/dL (ref 6–20)
CO2: 28 mmol/L (ref 22–32)
Calcium: 8.7 mg/dL — ABNORMAL LOW (ref 8.9–10.3)
Chloride: 107 mmol/L (ref 98–111)
Creatinine, Ser: 1.15 mg/dL (ref 0.61–1.24)
GFR, Estimated: >60 mL/min
Glucose, Bld: 71 mg/dL (ref 70–99)
Potassium: 4.1 mmol/L (ref 3.5–5.1)
Sodium: 142 mmol/L (ref 135–145)
Total Bilirubin: 0.3 mg/dL (ref 0.0–1.2)
Total Protein: 6.3 g/dL — ABNORMAL LOW (ref 6.5–8.1)

## 2024-08-19 LAB — CK: Total CK: 16498 U/L — ABNORMAL HIGH (ref 49–397)

## 2024-08-19 MED ORDER — SODIUM CHLORIDE 0.45 % IV SOLN
INTRAVENOUS | Status: AC
  Filled 2024-08-19 (×3): qty 75

## 2024-08-19 MED ORDER — FERROUS SULFATE 325 (65 FE) MG PO TABS
325.0000 mg | ORAL_TABLET | Freq: Every day | ORAL | Status: DC
  Administered 2024-08-20 – 2024-08-22 (×3): 325 mg via ORAL
  Filled 2024-08-19 (×3): qty 1

## 2024-08-19 NOTE — Plan of Care (Signed)

## 2024-08-19 NOTE — Progress Notes (Signed)
 [facility] LCSW Progress Note   08/19/2024    3:03 PM  Davari Lopes Kluender   [id number]   Type of Contact and Topic:  Psychiatric Bed Placement   Pt accepted to [facility] 301-2    Patient meets inpatient criteria per Charlene Buba, MD    The attending provider will be Dr. Prentis  Call report to [phone]    Kari Soho, RN @ [facility] ED notified.     Pt scheduled  to arrive at [facility] TODAY.    Roben Tatsch, MSW, LCSW-A  3:04 PM 08/19/2024

## 2024-08-19 NOTE — Progress Notes (Signed)
 " PROGRESS NOTE  Vincent Frank, is a 24 y.o. male, DOB - October 20, 2000, FMW:[id number]  Admit date - 08/17/2024   Admitting Physician Arham Symmonds Pearlean, MD  Outpatient Primary MD for the patient is [name], MD  LOS - 1  Chief Complaint  Patient presents with   Altered Mental Status   Cold Exposure      Brief Narrative:  24 y.o. male with medical history significant of  MDD, seizure disorder who was admitted on 08/17/2021 with concerns about drug overdose.  --Patient was recently admitted from 08/03/2023 to 08/12/2023 due to seizure-like activity, SSRI overdose, Benadryl  overdose, suicidal ideation and attempts by overdosing on Wellbutrin and Benadryl .    -Assessment and Plan: 1) acute metabolic encephalopathy--- UDS positive for benzos -- May continue as needed lorazepam  -Mentation is back to baseline  -patient is cooperative - Supportive care  2)Rhabdomyolysis-- -CK 11,116 >>20,000 >>16,498 - c/n  IV bicarb drip - Encourage adequate oral intake and avoid dehydration  3)Seizure-like activity Psychogenic nonepileptic seizures --- Previously evaluated by neurology -Depakote  level at this time is 39 which is subtherapeutic - May restart Depakote  at discharge  4)SSRI overdose, Benadryl  overdose--- Poison control input appreciated - Monitor electrolytes and EKG/QTc -- 5)MDD--history of suicidal ideation attempts in the past - Patient denies suicidal ideation at this - Continue one-to-one monitoring/sitter --He was cooperative with Telepsych provider Dr. Lorriane -- Telepsych consult appreciated, recommends inpatient psychiatric management once medically stable - PTA Prozac  and Depakote  on hold -- Patient was recently sent to a psychiatric facility in [place]    6) mild chronic anemia--- stable, -MCV and MCH are low -Ferritin and iron studies are borderline low -Give iron supplementation -Anticipate further drop in Hgb due to hemodilution from IV fluids - No  bleeding concerns  7)Leukocytosis--- WBC 21.1 >>12.5>>6.7 --No evidence of acute infection - Suspect this is reactive  Status is: Inpatient   Disposition: The patient is from: Home              Anticipated d/c is to: Home              Anticipated d/c date is: 1 day              Patient currently is not medically stable to d/c. Barriers: Not Clinically Stable-   Code Status :  -  Code Status: Full Code   Family Communication:    NA (patient is alert, awake and coherent)   DVT Prophylaxis  :   - SCDs  SCDs Start: 08/17/24 2052   Lab Results  Component Value Date   PLT 168 08/19/2024    Inpatient Medications  Scheduled Meds:  Chlorhexidine  Gluconate Cloth  6 each Topical Q0600   enoxaparin  (LOVENOX ) injection  55 mg Subcutaneous Q24H   Continuous Infusions:  sodium bicarbonate  75 mEq in sodium chloride  0.45 % 1,075 mL infusion 125 mL/hr at 08/19/24 1718   PRN Meds:.acetaminophen  **OR** acetaminophen , LORazepam , ondansetron  **OR** ondansetron  (ZOFRAN ) IV   Anti-infectives (From admission, onward)    None       Subjective: Vincent Frank today has no fevers, no emesis,  No chest pain,    - Secondary school teacher at bedside - Cooperative -He was cooperative with telepsych provider Dr. Lorriane -- Eating and drinking well  Objective: Vitals:   08/18/24 1419 08/18/24 2006 08/18/24 2344 08/19/24 0529  BP: 124/70 115/61 113/63 125/62  Pulse: 83 77 76 64  Resp: 20 20 19 18   Temp: 97.8 F (36.6 C) 98.3 F (  36.8 C) 98.4 F (36.9 C) 97.8 F (36.6 C)  TempSrc: Oral Oral Oral Oral  SpO2: 97% 97% 95% 97%  Weight:      Height:        Intake/Output Summary (Last 24 hours) at 08/19/2024 1720 Last data filed at 08/19/2024 1700 Gross per 24 hour  Intake 2810.62 ml  Output --  Net 2810.62 ml   Filed Weights   08/17/24 1658  Weight: 113.9 kg    Physical Exam Gen:- Awake Alert, cooperative HEENT:- [place].AT, No sclera icterus Neck-Supple Neck,No JVD,.  Lungs-  CTAB  , fair symmetrical air movement CV- S1, S2 normal, regular  Abd-  +ve B.Sounds, Abd Soft, No tenderness,    Extremity/Skin:- No  edema, pedal pulses present  Psych-affect is anxious, oriented x3, cooperative Neuro-no new focal deficits, mild tremors  Data Reviewed: I have personally reviewed following labs and imaging studies  CBC: Recent Labs  Lab 08/17/24 1709 08/18/24 0452 08/19/24 0356  WBC 21.1* 12.5* 6.7  HGB 13.4 11.3* 10.6*  HCT 41.2 35.2* 33.9*  MCV 75.0* 76.5* 78.5*  PLT 245 179 168   Basic Metabolic Panel: Recent Labs  Lab 08/17/24 1709 08/17/24 1850 08/18/24 0452 08/19/24 0356  NA 141  --  143 142  K 4.8  --  3.8 4.1  CL 102  --  106 107  CO2 20*  --  21* 28  GLUCOSE 105*  --  83 71  BUN 12  --  15 14  CREATININE 1.19  --  1.29* 1.15  CALCIUM 10.0  --  9.1 8.7*  MG  --  2.3 2.2  --   PHOS  --   --  6.8*  --    GFR: Estimated Creatinine Clearance: 138.4 mL/min (by C-G formula based on SCr of 1.15 mg/dL). Liver Function Tests: Recent Labs  Lab 08/17/24 1709 08/18/24 0452 08/19/24 0356  AST 106* 214* 232*  ALT 39 41 44  ALKPHOS 89 71 66  BILITOT 0.4 0.4 0.3  PROT 8.5* 6.9 6.3*  ALBUMIN 5.1* 4.3 4.0   Cardiac Enzymes: Recent Labs  Lab 08/17/24 1850 08/18/24 0452 08/19/24 1529  CKTOTAL 11,116* >20,000* 83,501*   Recent Results (from the past 240 hours)  Resp panel by RT-PCR (RSV, Flu A&B, Covid)     Status: None   Collection Time: 08/10/24 10:00 AM  Result Value Ref Range Status   SARS Coronavirus 2 by RT PCR NEGATIVE NEGATIVE Final    Comment: (NOTE) SARS-CoV-2 target nucleic acids are NOT DETECTED.  The SARS-CoV-2 RNA is generally detectable in upper respiratory specimens during the acute phase of infection. The lowest concentration of SARS-CoV-2 viral copies this assay can detect is 138 copies/mL. A negative result does not preclude SARS-Cov-2 infection and should not be used as the sole basis for treatment or other patient  management decisions. A negative result may occur with  improper specimen collection/handling, submission of specimen other than nasopharyngeal swab, presence of viral mutation(s) within the areas targeted by this assay, and inadequate number of viral copies(<138 copies/mL). A negative result must be combined with clinical observations, patient history, and epidemiological information. The expected result is Negative.  Fact Sheet for Patients:  bloggercourse.com  Fact Sheet for Healthcare Providers:  seriousbroker.it  This test is no t yet approved or cleared by the [place]  FDA and  has been authorized for detection and/or diagnosis of SARS-CoV-2 by FDA under an Emergency Use Authorization (EUA). This EUA will remain  in effect (meaning  this test can be used) for the duration of the COVID-19 declaration under Section 564(b)(1) of the Act, 21 U.S.C.section 360bbb-3(b)(1), unless the authorization is terminated  or revoked sooner.       Influenza A by PCR NEGATIVE NEGATIVE Final   Influenza B by PCR NEGATIVE NEGATIVE Final    Comment: (NOTE) The Xpert Xpress SARS-CoV-2/FLU/RSV plus assay is intended as an aid in the diagnosis of influenza from Nasopharyngeal swab specimens and should not be used as a sole basis for treatment. Nasal washings and aspirates are unacceptable for Xpert Xpress SARS-CoV-2/FLU/RSV testing.  Fact Sheet for Patients: bloggercourse.com  Fact Sheet for Healthcare Providers: seriousbroker.it  This test is not yet approved or cleared by the [place]  FDA and has been authorized for detection and/or diagnosis of SARS-CoV-2 by FDA under an Emergency Use Authorization (EUA). This EUA will remain in effect (meaning this test can be used) for the duration of the COVID-19 declaration under Section 564(b)(1) of the Act, 21 U.S.C. section 360bbb-3(b)(1),  unless the authorization is terminated or revoked.     Resp Syncytial Virus by PCR NEGATIVE NEGATIVE Final    Comment: (NOTE) Fact Sheet for Patients: bloggercourse.com  Fact Sheet for Healthcare Providers: seriousbroker.it  This test is not yet approved or cleared by the [place]  FDA and has been authorized for detection and/or diagnosis of SARS-CoV-2 by FDA under an Emergency Use Authorization (EUA). This EUA will remain in effect (meaning this test can be used) for the duration of the COVID-19 declaration under Section 564(b)(1) of the Act, 21 U.S.C. section 360bbb-3(b)(1), unless the authorization is terminated or revoked.  Performed at [facility], [address] [place], [place] [address]     Radiology Studies: CT Head Wo Contrast Result Date: 08/17/2024 EXAM: CT HEAD WITHOUT CONTRAST 08/17/2024 06:13:38 PM TECHNIQUE: CT of the head was performed without the administration of intravenous contrast. Automated exposure control, iterative reconstruction, and/or weight based adjustment of the mA/kV was utilized to reduce the radiation dose to as low as reasonably achievable. COMPARISON: None available. CLINICAL HISTORY: AMS Altered mental status. FINDINGS: BRAIN AND VENTRICLES: No acute hemorrhage. No evidence of acute infarct. No hydrocephalus. No extra-axial collection. No mass effect or midline shift. ORBITS: No acute abnormality. SINUSES: No acute abnormality. SOFT TISSUES AND SKULL: No acute soft tissue abnormality. No skull fracture. IMPRESSION: 1. No acute intracranial abnormality. Electronically signed by: Franky Crease MD 08/17/2024 06:18 PM EST RP Workstation: [id number]   Scheduled Meds:  Chlorhexidine  Gluconate Cloth  6 each Topical Q0600   enoxaparin  (LOVENOX ) injection  55 mg Subcutaneous Q24H   Continuous Infusions:  sodium bicarbonate  75 mEq in sodium chloride  0.45 % 1,075 mL infusion 125 mL/hr at  08/19/24 1718    LOS: 1 day   Rendall Carwin M.D on 08/19/2024 at 5:20 PM  Go to [email or web address] - for contact info  [facility] - Office  [phone]  If 7PM-7AM, please contact night-coverage [email or web address] 08/19/2024, 5:20 PM   "

## 2024-08-20 DIAGNOSIS — F332 Major depressive disorder, recurrent severe without psychotic features: Secondary | ICD-10-CM

## 2024-08-20 DIAGNOSIS — T43222A Poisoning by selective serotonin reuptake inhibitors, intentional self-harm, initial encounter: Secondary | ICD-10-CM

## 2024-08-20 DIAGNOSIS — R45851 Suicidal ideations: Secondary | ICD-10-CM

## 2024-08-20 DIAGNOSIS — G9341 Metabolic encephalopathy: Secondary | ICD-10-CM | POA: Diagnosis not present

## 2024-08-20 DIAGNOSIS — T50901A Poisoning by unspecified drugs, medicaments and biological substances, accidental (unintentional), initial encounter: Secondary | ICD-10-CM | POA: Diagnosis not present

## 2024-08-20 DIAGNOSIS — F333 Major depressive disorder, recurrent, severe with psychotic symptoms: Secondary | ICD-10-CM

## 2024-08-20 LAB — GLUCOSE, CAPILLARY: Glucose-Capillary: 108 mg/dL — ABNORMAL HIGH (ref 70–99)

## 2024-08-20 LAB — HEPATIC FUNCTION PANEL
ALT: 45 U/L — ABNORMAL HIGH (ref 0–44)
AST: 188 U/L — ABNORMAL HIGH (ref 15–41)
Albumin: 4 g/dL (ref 3.5–5.0)
Alkaline Phosphatase: 67 U/L (ref 38–126)
Bilirubin, Direct: 0.2 mg/dL (ref 0.0–0.2)
Indirect Bilirubin: 0.2 mg/dL — ABNORMAL LOW (ref 0.3–0.9)
Total Bilirubin: 0.3 mg/dL (ref 0.0–1.2)
Total Protein: 6.4 g/dL — ABNORMAL LOW (ref 6.5–8.1)

## 2024-08-20 MED ORDER — SODIUM CHLORIDE 0.9 % IV SOLN: INTRAVENOUS | Status: AC

## 2024-08-20 NOTE — Plan of Care (Signed)

## 2024-08-20 NOTE — Progress Notes (Signed)
" ° °  Responded to Rapid response in room 318  Upon arrival multiple staff members at bedside  Pt following instructions and commands appropriately...  --Vitals overall stable (elevated BP noted), No Hypoxia --Pt received Lorazepam  prior to my arrival - Patient has h/o Seizure-like activity/Psychogenic nonepileptic seizures --- Previously evaluated by neurology, No Frank seizures Continue supportive care Pt is overall Stable at this time  Rendall Carwin, MD   "

## 2024-08-20 NOTE — Plan of Care (Signed)
°  Problem: Clinical Measurements: °Goal: Ability to maintain clinical measurements within normal limits will improve °Outcome: Progressing °  °Problem: Clinical Measurements: °Goal: Diagnostic test results will improve °Outcome: Progressing °  °Problem: Nutrition: °Goal: Adequate nutrition will be maintained °Outcome: Progressing °  °Problem: Coping: °Goal: Level of anxiety will decrease °Outcome: Progressing °  °

## 2024-08-20 NOTE — Progress Notes (Addendum)
 " PROGRESS NOTE  Vincent Frank, is a 24 y.o. male, DOB - 12-26-2000, FMW:[id number]  Admit date - 08/17/2024   Admitting Physician Wendy Hoback Pearlean, MD  Outpatient Primary MD for the patient is [name], MD  LOS - 2  Chief Complaint  Patient presents with   Altered Mental Status   Cold Exposure      Brief Narrative:  24 y.o. male with medical history significant of  MDD, seizure disorder who was admitted on 08/17/2021 with concerns about drug overdose.  --Patient was recently admitted from 08/03/2023 to 08/12/2023 due to seizure-like activity, SSRI overdose, Benadryl  overdose, suicidal ideation and attempts by overdosing on Wellbutrin and Benadryl .    -Assessment and Plan: 1) acute metabolic encephalopathy--- UDS positive for benzos -- May continue as needed lorazepam  -Mentation is back to baseline  -patient is cooperative - Supportive care  2)Rhabdomyolysis-- -CK 11,116 >>20,000 >>16,498 - Okay to transition from IV bicarb drip to NS later today  - Encourage adequate oral intake and avoid dehydration - Repeat CPK and LFTs in a.m. (08/21/24)  3)Seizure-like activity Psychogenic nonepileptic seizures --- Previously evaluated by neurology -Depakote  level at this time is 72 which is subtherapeutic - May restart Depakote  at discharge at psychiatric team's discretion  4)SSRI overdose, Benadryl  overdose--- Poison control input appreciated - Monitor electrolytes and EKG/QTc -- 5)MDD--history of suicidal ideation attempts in the past - Patient denies suicidal ideation at this --He was cooperative with Telepsych provider Dr. Lorriane on 08/19/24 -- Telepsych consult appreciated, recommends inpatient psychiatric management once medically stable - PTA Prozac  and Depakote  on hold -- Patient was recently sent to a psychiatric facility in [place]   - Continue IVC and - Continue one-to-one monitoring/sitter  6) mild chronic anemia--- stable, -MCV and MCH are  low -Ferritin and iron studies are borderline low -c/n  iron supplementation -Anticipate further drop in Hgb due to hemodilution from IV fluids - No bleeding concerns  7)Leukocytosis--- WBC 21.1 >>12.5>>6.7 --No evidence of acute infection - Suspect this is reactive  Status is: Inpatient   Disposition: The patient is from: Home              Anticipated d/c is to: Inpatient psychiatric unit              Anticipated d/c date is: 1 day              Patient currently is not medically stable to d/c.--- Hopefully CPKs and LFTs continues to improve and patient can be discharged to inpatient psychiatric facility on 08/21/2024 Barriers: Not Clinically Stable-   Code Status :  -  Code Status: Full Code   Family Communication:    NA (patient is alert, awake and coherent)   DVT Prophylaxis  :   - SCDs  SCDs Start: 08/17/24 2052   Lab Results  Component Value Date   PLT 168 08/19/2024   Inpatient Medications  Scheduled Meds:  Chlorhexidine  Gluconate Cloth  6 each Topical Q0600   enoxaparin  (LOVENOX ) injection  55 mg Subcutaneous Q24H   ferrous sulfate   325 mg Oral Q breakfast   Continuous Infusions:  sodium bicarbonate  75 mEq in sodium chloride  0.45 % 1,075 mL infusion 125 mL/hr at 08/20/24 1106   PRN Meds:.acetaminophen  **OR** acetaminophen , LORazepam , ondansetron  **OR** ondansetron  (ZOFRAN ) IV   Anti-infectives (From admission, onward)    None       Subjective: Vincent Frank today has no fevers, no emesis,  No chest pain,    - Secondary school teacher at  bedside - Remains pleasant and cooperative - Voiding and eating well  -- Continue IVC and - Continue one-to-one monitoring/sitter Hopefully CPKs and LFTs continues to improve and patient can be discharged to inpatient psychiatric facility on 08/21/2024  Objective: Vitals:   08/18/24 2344 08/19/24 0529 08/19/24 1939 08/20/24 0458  BP: 113/63 125/62 120/70 (!) 105/58  Pulse: 76 64 76 (!) 51  Resp: 19 18  18   Temp: 98.4  F (36.9 C) 97.8 F (36.6 C) 97.8 F (36.6 C) 98 F (36.7 C)  TempSrc: Oral Oral Oral Oral  SpO2: 95% 97% 98% 97%  Weight:      Height:        Intake/Output Summary (Last 24 hours) at 08/20/2024 1140 Last data filed at 08/20/2024 0925 Gross per 24 hour  Intake 2991.12 ml  Output --  Net 2991.12 ml   Filed Weights   08/17/24 1658  Weight: 113.9 kg    Physical Exam Gen:- Awake Alert, pleasant and cooperative HEENT:- [place].AT, No sclera icterus Neck-Supple Neck,No JVD,.  Lungs-  CTAB , fair symmetrical air movement CV- S1, S2 normal, regular  Abd-  +ve B.Sounds, Abd Soft, No tenderness,    Extremity/Skin:- No  edema, pedal pulses present  Psych-affect is anxious, oriented x3, cooperative Neuro-no new focal deficits, mild tremors  Data Reviewed: I have personally reviewed following labs and imaging studies  CBC: Recent Labs  Lab 08/17/24 1709 08/18/24 0452 08/19/24 0356  WBC 21.1* 12.5* 6.7  HGB 13.4 11.3* 10.6*  HCT 41.2 35.2* 33.9*  MCV 75.0* 76.5* 78.5*  PLT 245 179 168   Basic Metabolic Panel: Recent Labs  Lab 08/17/24 1709 08/17/24 1850 08/18/24 0452 08/19/24 0356  NA 141  --  143 142  K 4.8  --  3.8 4.1  CL 102  --  106 107  CO2 20*  --  21* 28  GLUCOSE 105*  --  83 71  BUN 12  --  15 14  CREATININE 1.19  --  1.29* 1.15  CALCIUM 10.0  --  9.1 8.7*  MG  --  2.3 2.2  --   PHOS  --   --  6.8*  --    GFR: Estimated Creatinine Clearance: 138.4 mL/min (by C-G formula based on SCr of 1.15 mg/dL). Liver Function Tests: Recent Labs  Lab 08/17/24 1709 08/18/24 0452 08/19/24 0356 08/20/24 0420  AST 106* 214* 232* 188*  ALT 39 41 44 45*  ALKPHOS 89 71 66 67  BILITOT 0.4 0.4 0.3 0.3  PROT 8.5* 6.9 6.3* 6.4*  ALBUMIN 5.1* 4.3 4.0 4.0   Cardiac Enzymes: Recent Labs  Lab 08/17/24 1850 08/18/24 0452 08/19/24 1529  CKTOTAL 11,116* >20,000* 16,498*   Recent Results (from the past 240 hours)  MRSA Next Gen by PCR, Nasal     Status: None    Collection Time: 08/18/24 12:52 PM   Specimen: Nasal Swab  Result Value Ref Range Status   MRSA by PCR Next Gen NOT DETECTED NOT DETECTED Final    Comment: (NOTE) The GeneXpert MRSA Assay (FDA approved for NASAL specimens only), is one component of a comprehensive MRSA colonization surveillance program. It is not intended to diagnose MRSA infection nor to guide or monitor treatment for MRSA infections. Test performance is not FDA approved in patients less than 64 years old. Performed at [facility], [address], [place], [place] [address]     Scheduled Meds:  Chlorhexidine  Gluconate Cloth  6 each Topical Q0600   enoxaparin  (LOVENOX )  injection  55 mg Subcutaneous Q24H   ferrous sulfate   325 mg Oral Q breakfast   Continuous Infusions:  sodium bicarbonate  75 mEq in sodium chloride  0.45 % 1,075 mL infusion 125 mL/hr at 08/20/24 1106    LOS: 2 days   Rendall Carwin M.D on 08/20/2024 at 11:40 AM  Go to [email or web address] - for contact info  [facility] - Office  [phone]  If 7PM-7AM, please contact night-coverage [email or web address] 08/20/2024, 11:40 AM   "

## 2024-08-21 DIAGNOSIS — T43222A Poisoning by selective serotonin reuptake inhibitors, intentional self-harm, initial encounter: Secondary | ICD-10-CM | POA: Diagnosis not present

## 2024-08-21 DIAGNOSIS — D509 Iron deficiency anemia, unspecified: Secondary | ICD-10-CM | POA: Diagnosis not present

## 2024-08-21 DIAGNOSIS — T50903A Poisoning by unspecified drugs, medicaments and biological substances, assault, initial encounter: Secondary | ICD-10-CM

## 2024-08-21 DIAGNOSIS — T50902A Poisoning by unspecified drugs, medicaments and biological substances, intentional self-harm, initial encounter: Secondary | ICD-10-CM | POA: Diagnosis not present

## 2024-08-21 DIAGNOSIS — T50901A Poisoning by unspecified drugs, medicaments and biological substances, accidental (unintentional), initial encounter: Secondary | ICD-10-CM | POA: Diagnosis not present

## 2024-08-21 DIAGNOSIS — F333 Major depressive disorder, recurrent, severe with psychotic symptoms: Secondary | ICD-10-CM | POA: Diagnosis not present

## 2024-08-21 DIAGNOSIS — F332 Major depressive disorder, recurrent severe without psychotic features: Secondary | ICD-10-CM | POA: Diagnosis not present

## 2024-08-21 LAB — URINE CULTURE: Culture: >=100000 — AB

## 2024-08-21 LAB — COMPREHENSIVE METABOLIC PANEL WITH GFR
ALT: 53 U/L — ABNORMAL HIGH (ref 0–44)
AST: 130 U/L — ABNORMAL HIGH (ref 15–41)
Albumin: 4.5 g/dL (ref 3.5–5.0)
Alkaline Phosphatase: 79 U/L (ref 38–126)
Anion gap: 12 (ref 5–15)
BUN: 11 mg/dL (ref 6–20)
CO2: 25 mmol/L (ref 22–32)
Calcium: 9.8 mg/dL (ref 8.9–10.3)
Chloride: 102 mmol/L (ref 98–111)
Creatinine, Ser: 1.01 mg/dL (ref 0.61–1.24)
GFR, Estimated: >60 mL/min
Glucose, Bld: 90 mg/dL (ref 70–99)
Potassium: 4.4 mmol/L (ref 3.5–5.1)
Sodium: 140 mmol/L (ref 135–145)
Total Bilirubin: 0.3 mg/dL (ref 0.0–1.2)
Total Protein: 7.5 g/dL (ref 6.5–8.1)

## 2024-08-21 LAB — CK: Total CK: 4264 U/L — ABNORMAL HIGH (ref 49–397)

## 2024-08-21 MED ORDER — MORPHINE SULFATE (PF) 2 MG/ML IV SOLN
2.0000 mg | Freq: Once | INTRAVENOUS | Status: AC | PRN
  Administered 2024-08-22: 2 mg via INTRAVENOUS
  Filled 2024-08-21: qty 1

## 2024-08-21 MED ORDER — LIDOCAINE 5 % EX PTCH
1.0000 | MEDICATED_PATCH | Freq: Once | CUTANEOUS | Status: AC
  Administered 2024-08-21: 1 via TRANSDERMAL
  Filled 2024-08-21: qty 1

## 2024-08-21 NOTE — Plan of Care (Signed)

## 2024-08-21 NOTE — Progress Notes (Signed)
" ° °  CK and LFTs have improved---he is medically stable for dc to [facility]/inpatient psych  -  CK > 20,000 >>>16, 498 >>4,264  AST 232 >>188 >>130 -  Medically stable for transfer to inpatient psychiatric care  Rendall Carwin, MD  "

## 2024-08-21 NOTE — TOC Progression Note (Signed)
 Transition of Care [facility]) - Progression Note    Patient Details  Name: Vincent Frank MRN: [id number] Date of Birth: 20-Aug-2000  Transition of Care [facility]) CM/SW Contact  Mcarthur Saddie Kim, [place] Phone Number: 08/21/2024, 10:19 AM  Clinical Narrative: Per [facility], bed is no longer available. LCSW sent out referrals for inpatient psych bed. Will follow.                         Expected Discharge Plan and Services                                               Social Drivers of Health (SDOH) Interventions SDOH Screenings   Food Insecurity: No Food Insecurity (08/18/2024)  Recent Concern: Food Insecurity - Food Insecurity Present (07/28/2024)   Received from [facility]  Housing: Low Risk (08/18/2024)  Recent Concern: Housing - High Risk (07/28/2024)   Received from [facility]  Transportation Needs: No Transportation Needs (08/18/2024)  Recent Concern: Transportation Needs - Unmet Transportation Needs (07/13/2024)   Received from [facility]  Utilities: Not At Risk (08/18/2024)  Recent Concern: Utilities - High Risk (07/13/2024)   Received from [facility]  Alcohol Screen: Low Risk (06/03/2023)  Financial Resource Strain: Medium Risk (07/13/2024)   Received from [facility]  Physical Activity: Sufficiently Active (07/13/2024)   Received from [facility]  Social Connections: Patient Declined (08/03/2024)  Recent Concern: Social Connections - Moderately Isolated (07/13/2024)   Received from [facility]  Stress: Stress Concern Present (07/28/2024)   Received from [facility]  Tobacco Use: Low Risk (08/02/2024)  Health Literacy: Low Risk (07/13/2024)   Received from [facility]    Readmission Risk Interventions     No data to display

## 2024-08-21 NOTE — TOC Progression Note (Signed)
 Transition of Care [facility]) - Progression Note    Patient Details  Name: Vincent Frank MRN: [id number] Date of Birth: 04/01/2001  Transition [facility] Care [facility]) CM/SW Contact  Mcarthur Saddie Kim, [place] Phone Number: 08/21/2024, 3:55 PM  Clinical Narrative:  Vincent Frank can accept pt on Wednesday if pt is still needing inpatient psych at that time. TOC will follow.       Barriers to Discharge: Psych Bed not available               Expected Discharge Plan and Services                                               Social Drivers of Health (SDOH) Interventions SDOH Screenings   Food Insecurity: No Food Insecurity (08/18/2024)  Recent Concern: Food Insecurity - Food Insecurity Present (07/28/2024)   Received from [facility]  Housing: Low Risk (08/18/2024)  Recent Concern: Housing - High Risk (07/28/2024)   Received from [facility]  Transportation Needs: No Transportation Needs (08/18/2024)  Recent Concern: Transportation Needs - Unmet Transportation Needs (07/13/2024)   Received from [facility]  Utilities: Not At Risk (08/18/2024)  Recent Concern: Utilities - High Risk (07/13/2024)   Received from [facility]  Alcohol Screen: Low Risk (06/03/2023)  Financial Resource Strain: Medium Risk (07/13/2024)   Received from [facility]  Physical Activity: Sufficiently Active (07/13/2024)   Received from [facility]  Social Connections: Patient Declined (08/03/2024)  Recent Concern: Social Connections - Moderately Isolated (07/13/2024)   Received from [facility]  Stress: Stress Concern Present (07/28/2024)   Received from [facility]  Tobacco Use: Low Risk (08/02/2024)  Health Literacy: Low Risk (07/13/2024)   Received from [facility]    Readmission Risk Interventions     No data to display

## 2024-08-21 NOTE — Progress Notes (Signed)
 " PROGRESS NOTE  Vincent Frank, is a 24 y.o. male, DOB - 2000-10-07, FMW:[id number]  Admit date - 08/17/2024   Admitting Physician Laren Orama Pearlean, MD  Outpatient Primary MD for the patient is Vincent Lame, MD  LOS - 3  Chief Complaint  Patient presents with   Altered Mental Status   Cold Exposure      Brief Narrative:  24 y.o. male with medical history significant of  MDD, seizure disorder who was admitted on 08/17/2021 with concerns about drug overdose.  --Patient was recently admitted from 08/03/2023 to 08/12/2023 due to seizure-like activity, SSRI overdose, Benadryl  overdose, suicidal ideation and attempts by overdosing on Wellbutrin and Benadryl .    -Assessment and Plan: 1) acute metabolic encephalopathy--- UDS positive for benzos -- May continue as needed lorazepam  -Mentation is back to baseline  -patient is cooperative - Supportive care  2)Rhabdomyolysis/transaminitis-- -CK 11,116 >>20,000 >>16,498>>>>4,264 -AST 232 >>188 >>130 ---IV fluids can be discontinued - Encourage adequate oral intake and avoid dehydration   3)Seizure-like activity Psychogenic nonepileptic seizures --- Previously evaluated by neurology -Depakote  level at this time is 53 which is subtherapeutic - May restart Depakote  at discharge at psychiatric team's discretion  4)SSRI overdose, Benadryl  overdose--- Poison control input appreciated - Monitor electrolytes and EKG/QTc -- 5)MDD--history of suicidal ideation attempts in the past - Patient denies suicidal ideation at this --He was cooperative with Telepsych provider Dr. Lorriane on 08/19/24 -- Telepsych consult appreciated, recommends inpatient psychiatric management once medically stable - PTA Prozac  and Depakote  on hold -- Patient was recently sent to a psychiatric facility in [place]   - Continue IVC and - Continue one-to-one monitoring/sitter  6) mild chronic anemia--- stable, -MCV and MCH are low -Ferritin and iron studies  are borderline low -c/n  iron supplementation -Anticipate further drop in Hgb due to hemodilution from IV fluids - No bleeding concerns  7)Leukocytosis--- WBC 21.1 >>12.5>>6.7 --No evidence of acute infection - Suspect this is reactive  Disposition- CK and LFTs have improved---he is medically stable for dc to [facility]/inpatient psych Medically stable for transfer to inpatient psychiatric care  Status is: Inpatient   Disposition: The patient is from: Home              Anticipated d/c is to: Inpatient psychiatric unit              Anticipated d/c date is: 1 day              Patient currently is medically stable to d/c.---   Barriers: Awaiting inpatient psychiatric bed  Code Status :  -  Code Status: Full Code   Family Communication:    NA (patient is alert, awake and coherent)   DVT Prophylaxis  :   - SCDs  SCDs Start: 08/17/24 2052   Lab Results  Component Value Date   PLT 168 08/19/2024   Inpatient Medications  Scheduled Meds:  Chlorhexidine  Gluconate Cloth  6 each Topical Q0600   enoxaparin  (LOVENOX ) injection  55 mg Subcutaneous Q24H   ferrous sulfate   325 mg Oral Q breakfast   Continuous Infusions:   PRN Meds:.acetaminophen  **OR** acetaminophen , LORazepam , ondansetron  **OR** ondansetron  (ZOFRAN ) IV   Anti-infectives (From admission, onward)    None       Subjective: [name] today has no fevers, no emesis,  No chest pain,    - One-to-one sitter at bedside - Remains pleasant and cooperative - Voiding and eating well  -- Continue IVC and - Continue one-to-one monitoring/sitter  -Disposition- CK  and LFTs have improved---he is medically stable for dc to [facility]/inpatient psych Medically stable for transfer to inpatient psychiatric care  Objective: Vitals:   08/20/24 1942 08/20/24 1947 08/21/24 0416 08/21/24 1204  BP: 126/72 126/72 (!) 114/57 124/79  Pulse: 66 66 69 69  Resp: 18     Temp: 98 F (36.7 C) 98 F (36.7 C) 98.1 F (36.7 C) 98.2 F  (36.8 C)  TempSrc: Oral Oral Oral Oral  SpO2: 98% 98% 97% 100%  Weight:      Height:        Intake/Output Summary (Last 24 hours) at 08/21/2024 1852 Last data filed at 08/21/2024 1812 Gross per 24 hour  Intake 840 ml  Output --  Net 840 ml   Filed Weights   08/17/24 1658  Weight: 113.9 kg    Physical Exam Gen:- Awake Alert, pleasant and cooperative HEENT:- [place].AT, No sclera icterus Neck-Supple Neck,No JVD,.  Lungs-  CTAB , fair symmetrical air movement CV- S1, S2 normal, regular  Abd-  +ve B.Sounds, Abd Soft, No tenderness,    Extremity/Skin:- No  edema, pedal pulses present  Psych-affect is anxious, oriented x3, cooperative Neuro-no new focal deficits, mild tremors  Data Reviewed: I have personally reviewed following labs and imaging studies  CBC: Recent Labs  Lab 08/17/24 1709 08/18/24 0452 08/19/24 0356  WBC 21.1* 12.5* 6.7  HGB 13.4 11.3* 10.6*  HCT 41.2 35.2* 33.9*  MCV 75.0* 76.5* 78.5*  PLT 245 179 168   Basic Metabolic Panel: Recent Labs  Lab 08/17/24 1709 08/17/24 1850 08/18/24 0452 08/19/24 0356 08/21/24 1242  NA 141  --  143 142 140  K 4.8  --  3.8 4.1 4.4  CL 102  --  106 107 102  CO2 20*  --  21* 28 25  GLUCOSE 105*  --  83 71 90  BUN 12  --  15 14 11   CREATININE 1.19  --  1.29* 1.15 1.01  CALCIUM 10.0  --  9.1 8.7* 9.8  MG  --  2.3 2.2  --   --   PHOS  --   --  6.8*  --   --    GFR: Estimated Creatinine Clearance: 157.6 mL/min (by C-G formula based on SCr of 1.01 mg/dL). Liver Function Tests: Recent Labs  Lab 08/17/24 1709 08/18/24 0452 08/19/24 0356 08/20/24 0420 08/21/24 1242  AST 106* 214* 232* 188* 130*  ALT 39 41 44 45* 53*  ALKPHOS 89 71 66 67 79  BILITOT 0.4 0.4 0.3 0.3 0.3  PROT 8.5* 6.9 6.3* 6.4* 7.5  ALBUMIN 5.1* 4.3 4.0 4.0 4.5   Cardiac Enzymes: Recent Labs  Lab 08/17/24 1850 08/18/24 0452 08/19/24 1529 08/21/24 1242  CKTOTAL 11,116* >20,000* 83,501* 4,264*   Recent Results (from the past 240 hours)   MRSA Next Gen by PCR, Nasal     Status: None   Collection Time: 08/18/24 12:52 PM   Specimen: Nasal Swab  Result Value Ref Range Status   MRSA by PCR Next Gen NOT DETECTED NOT DETECTED Final    Comment: (NOTE) The GeneXpert MRSA Assay (FDA approved for NASAL specimens only), is one component of a comprehensive MRSA colonization surveillance program. It is not intended to diagnose MRSA infection nor to guide or monitor treatment for MRSA infections. Test performance is not FDA approved in patients less than 68 years old. Performed at [facility], [address], [place], [place] [address]   Urine Culture (for pregnant, neutropenic or urologic patients or  patients with an indwelling urinary catheter)     Status: Abnormal   Collection Time: 08/19/24  9:08 PM   Specimen: Urine, Clean Catch  Result Value Ref Range Status   Specimen Description   Final    URINE, CLEAN CATCH Performed at [facility], [address] [place], [place] [address]    Special Requests   Final    NONE Performed at [facility], [address] [place], [place] [address]    Culture >=100,000 COLONIES/mL ENTEROCOCCUS FAECALIS (A)  Final   Report Status 08/21/2024 FINAL  Final   Organism ID, Bacteria ENTEROCOCCUS FAECALIS (A)  Final      Susceptibility   Enterococcus faecalis - MIC*    AMPICILLIN <=2 SENSITIVE Sensitive     NITROFURANTOIN <=16 SENSITIVE Sensitive     VANCOMYCIN 1 SENSITIVE Sensitive     * >=100,000 COLONIES/mL ENTEROCOCCUS FAECALIS    Scheduled Meds:  Chlorhexidine  Gluconate Cloth  6 each Topical Q0600   enoxaparin  (LOVENOX ) injection  55 mg Subcutaneous Q24H   ferrous sulfate   325 mg Oral Q breakfast   Continuous Infusions:    LOS: 3 days   Rendall Carwin M.D on 08/21/2024 at 6:52 PM  Go to [email or web address] - for contact info  [facility] - Office  [phone]  If 7PM-7AM, please contact night-coverage [email or web address] 08/21/2024, 6:52 PM   "

## 2024-08-22 ENCOUNTER — Encounter (HOSPITAL_COMMUNITY): Payer: Self-pay | Admitting: Family Medicine

## 2024-08-22 DIAGNOSIS — D509 Iron deficiency anemia, unspecified: Secondary | ICD-10-CM

## 2024-08-22 DIAGNOSIS — T43222A Poisoning by selective serotonin reuptake inhibitors, intentional self-harm, initial encounter: Secondary | ICD-10-CM | POA: Diagnosis not present

## 2024-08-22 DIAGNOSIS — T50902A Poisoning by unspecified drugs, medicaments and biological substances, intentional self-harm, initial encounter: Secondary | ICD-10-CM | POA: Diagnosis not present

## 2024-08-22 DIAGNOSIS — T50901A Poisoning by unspecified drugs, medicaments and biological substances, accidental (unintentional), initial encounter: Secondary | ICD-10-CM | POA: Diagnosis not present

## 2024-08-22 LAB — CBC
HCT: 40.6 % (ref 39.0–52.0)
Hemoglobin: 13.2 g/dL (ref 13.0–17.0)
MCH: 24.6 pg — ABNORMAL LOW (ref 26.0–34.0)
MCHC: 32.5 g/dL (ref 30.0–36.0)
MCV: 75.7 fL — ABNORMAL LOW (ref 80.0–100.0)
Platelets: 230 10*3/uL (ref 150–400)
RBC: 5.36 MIL/uL (ref 4.22–5.81)
RDW: 13.6 % (ref 11.5–15.5)
WBC: 8.7 10*3/uL (ref 4.0–10.5)
nRBC: 0 % (ref 0.0–0.2)

## 2024-08-22 LAB — MAGNESIUM: Magnesium: 2.1 mg/dL (ref 1.7–2.4)

## 2024-08-22 LAB — BASIC METABOLIC PANEL WITH GFR
Anion gap: 16 — ABNORMAL HIGH (ref 5–15)
BUN: 12 mg/dL (ref 6–20)
CO2: 24 mmol/L (ref 22–32)
Calcium: 10.1 mg/dL (ref 8.9–10.3)
Chloride: 101 mmol/L (ref 98–111)
Creatinine, Ser: 1.06 mg/dL (ref 0.61–1.24)
GFR, Estimated: >60 mL/min
Glucose, Bld: 86 mg/dL (ref 70–99)
Potassium: 3.9 mmol/L (ref 3.5–5.1)
Sodium: 141 mmol/L (ref 135–145)

## 2024-08-22 LAB — LACTIC ACID, PLASMA: Lactic Acid, Venous: 3.6 mmol/L (ref 0.5–1.9)

## 2024-08-22 LAB — TROPONIN T, HIGH SENSITIVITY: Troponin T High Sensitivity: <6 ng/L (ref 0–19)

## 2024-08-22 MED ORDER — SODIUM CHLORIDE 0.9 % IV BOLUS
2000.0000 mL | Freq: Once | INTRAVENOUS | Status: AC
  Administered 2024-08-22: 2000 mL via INTRAVENOUS

## 2024-08-22 MED ORDER — ENOXAPARIN SODIUM 40 MG/0.4ML IJ SOSY: 40.0000 mg | PREFILLED_SYRINGE | INTRAMUSCULAR | Status: DC

## 2024-08-22 MED ORDER — LACTATED RINGERS IV BOLUS
1000.0000 mL | Freq: Once | INTRAVENOUS | Status: AC
  Administered 2024-08-22: 1000 mL via INTRAVENOUS

## 2024-08-22 MED ORDER — FERROUS SULFATE 325 (65 FE) MG PO TABS: 325.0000 mg | ORAL_TABLET | Freq: Every day | ORAL | 3 refills | Status: AC

## 2024-08-22 MED ORDER — ACETAMINOPHEN 325 MG PO TABS: 650.0000 mg | ORAL_TABLET | Freq: Four times a day (QID) | ORAL | Status: AC | PRN

## 2024-08-22 MED ORDER — LORAZEPAM 2 MG/ML IJ SOLN: 1.0000 mg | INTRAMUSCULAR | Status: DC | PRN

## 2024-08-22 NOTE — Discharge Summary (Signed)
 "                                                                                 Vincent Frank, is a 24 y.o. male  DOB 10/07/2000  MRN [id number].  Admission date:  08/17/2024  Admitting Physician  Rendall Carwin, MD  Discharge Date:  08/22/2024   Primary MD  [name], MD  Recommendations for primary care physician for things to follow:   1)Discharged to inpatient psychiatric unit/[facility]  Admission Diagnosis  Disorientation [R41.0] Non-traumatic rhabdomyolysis [M62.82] Ingestion of unknown medication [T50.901A] Acute metabolic encephalopathy [G93.41]  Discharge Diagnosis  Disorientation [R41.0] Non-traumatic rhabdomyolysis [M62.82] Ingestion of unknown medication [T50.901A] Acute metabolic encephalopathy [G93.41]    Principal Problem:   Ingestion of unknown medication Active Problems:   SSRI overdose, intentional self-harm, initial encounter (HCC)   Suicidal ideation   Iron deficiency anemia   Seizure (HCC)   MDD (major depressive disorder), recurrent severe, without psychosis (HCC)   MDD (major depressive disorder), recurrent, severe, with psychosis (HCC)   Suicidal overdose, initial encounter (HCC)   Intentional SSRI (selective serotonin reuptake inhibitor) overdose (HCC)   Acute metabolic encephalopathy      Past Medical History:  Diagnosis Date   Anxiety    Depression    Major depressive disorder    Seizures (HCC)     Past Surgical History:  Procedure Laterality Date   NO PAST SURGERIES       HPI  from the history and physical done on the day of admission:   HPI: Vincent Frank is a 24 y.o. male with medical history significant of  MDD, seizure disorder who presents to the emergency department via EMS due to drug overdose.  At bedside, patient states that he overdosed on Benadryl  without being able to provide further history.  Rest of the history was obtained from EDP and ED medical record.  Per report, ED medical record states that  patient's mother Elray) endorsed that patient was last seen yesterday after he got home from behavioral health facility.  He left last night without being sure if he left with any medication as there was no obvious missing medication at home. Parents were able to trace his location to a field in [place] on cell phone and police department (PD) was called, he was reported to be confused when seen by PD and was taken to the ED   Patient was recently admitted from 08/03/2023 to 08/12/2023 due to seizure-like activity, SSRI overdose, Benadryl  overdose, suicidal ideation and attempts by overdosing on Wellbutrin and Benadryl .     ED course In the Emergency Department, temperature was 95.38F and was provided with bair hugger. He was tachypneic, BP 151/2400 vital signs were within normal range.  Workup in the ED showed normal CBC except for WBC of 21.1 and MCV of 75.0.  BMP was normal except for bicarb of 20 and blood glucose of 105.  Albumin 5.1, AST 106, anion gap 19, salicylate, acetaminophen , ethanol level were undetectable.  Urine drug screen was positive for benzodiazepine.  Total CK 11,116 CT head without contrast showed no acute intracranial abnormality IV hydration was provided.     Review of Systems:  As mentioned in the history of present illness. All other systems reviewed and are negative.   Hospital Course:   Brief Narrative:  24 y.o. male with medical history significant of  MDD, seizure disorder who was admitted on 08/17/2021 with concerns about drug overdose.  --Patient was recently admitted from 08/03/2023 to 08/12/2023 due to seizure-like activity, SSRI overdose, Benadryl  overdose, suicidal ideation and attempts by overdosing on Wellbutrin and Benadryl .    -Assessment and Plan: 1) acute metabolic encephalopathy--- UDS positive for benzos -- ok to use  as needed lorazepam  for anxiety -Mentation is back to baseline  -patient is cooperative - Supportive care    2)Rhabdomyolysis/transaminitis-- -CK 11,116 >>20,000 >>16,498>>>>4,264 -AST 232 >>188 >>130 -Treated with IV fluids - Encourage adequate oral intake and avoid dehydration   3)Seizure-like activity Psychogenic nonepileptic seizures --- Previously evaluated by neurology -Depakote  level at this time is 35 which is subtherapeutic - May restart Depakote  at discharge at psychiatric team's discretion   4)SSRI overdose, Benadryl  overdose--- Poison control input appreciated - Monitor electrolytes and EKG/QTc -- 5)MDD--history of suicidal ideation attempts in the past - Patient denies suicidal ideation at this --He was cooperative with Telepsych provider Dr. Lorriane on 08/19/24 -- Telepsych consult appreciated, recommends inpatient psychiatric management  - PTA Prozac  and Depakote  on hold -- Patient was recently sent to a psychiatric facility in [place]   - Continue IVC and - Continue one-to-one monitoring/sitter   6)Mild chronic Anemia--- stable, -MCV and MCH are low -Ferritin and iron studies are borderline low -c/n  iron supplementation - No bleeding concerns   7)Leukocytosis--- WBC 21.1 >>12.5>>6.7 --No evidence of acute infection - Suspect this is reactive   Disposition- CK and LFTs have improved---he is medically stable for dc to [facility]/inpatient psych Medically stable for transfer to inpatient psychiatric care   Disposition: The patient is from: Home              Anticipated d/c is to: Inpatient psychiatric unit  Discharge Condition: stable   Follow UP   Follow-up Information     [name], MD. Schedule an appointment as soon as possible for a visit.   Specialty: Family Medicine Why: As needed Contact information: [address] [facility] [place] [address] [phone]                 Consults obtained -psychiatrizt Diet and Activity recommendation:  As advised  Discharge Instructions     Discharge Instructions     Call MD for:  difficulty breathing, headache or visual disturbances   Complete by: As directed    Call MD for:  persistant dizziness or light-headedness   Complete by: As directed    Call MD for:  persistant nausea and vomiting   Complete by: As directed    Call MD for:  temperature >100.4   Complete by: As directed    Diet general   Complete by: As directed    Discharge instructions   Complete by: As directed    1)Discharged to inpatient psychiatric unit/[facility]   Increase activity slowly   Complete by: As directed        Discharge Medications     Allergies as of 08/22/2024       Reactions   Bee Venom Anaphylaxis   Gabapentin  Other (See Comments), Anaphylaxis   Muscle twitching   Haldol [haloperidol] Anaphylaxis, Other (See Comments)   Per father, dystonic reaction with  muscle contractures- also   [employer]  Venom Anaphylaxis, Itching   Peanut-containing Drug Products Anaphylaxis   Wasp Venom Anaphylaxis   Beeswax Swelling        Medication List     TAKE these medications    acetaminophen  325 MG tablet Commonly known as: TYLENOL  Take 2 tablets (650 mg total) by mouth every 6 (six) hours as needed for mild pain (pain score 1-3) or fever (or Fever >/= 101).   divalproex  500 MG DR tablet Commonly known as: DEPAKOTE  Take 1 tablet (500 mg total) by mouth every 12 (twelve) hours.   ferrous sulfate  325 (65 FE) MG tablet Take 1 tablet (325 mg total) by mouth daily with breakfast. Start taking on: August 23, 2024   FLUoxetine  20 MG capsule Commonly known as: PROZAC  Take 3 capsules (60 mg total) by mouth daily for 14 days.   hydrOXYzine  25 MG capsule Commonly known as: VISTARIL  Take 25 mg by mouth every 8 (eight) hours as needed.   OLANZapine  5 MG tablet Commonly known as: ZYPREXA  Take 10 mg by mouth at bedtime.       Major procedures and Radiology Reports - PLEASE review detailed and final reports for all details, in brief -    CT Head Wo Contrast Result Date: 08/17/2024 EXAM: CT HEAD WITHOUT CONTRAST 08/17/2024 06:13:38 PM TECHNIQUE: CT of the head was performed without the administration of intravenous contrast. Automated exposure control, iterative reconstruction, and/or weight based adjustment of the mA/kV was utilized to reduce the radiation dose to as low as reasonably achievable. COMPARISON: None available. CLINICAL HISTORY: AMS Altered mental status. FINDINGS: BRAIN AND VENTRICLES: No acute hemorrhage. No evidence of acute infarct. No hydrocephalus. No extra-axial collection. No mass effect or midline shift. ORBITS: No acute abnormality. SINUSES: No acute abnormality. SOFT TISSUES AND SKULL: No acute soft tissue abnormality. No skull fracture. IMPRESSION: 1. No acute intracranial abnormality. Electronically signed by: Franky Crease MD 08/17/2024 06:18 PM EST RP Workstation: [id number]   CT HEAD WO CONTRAST ( ) Result Date: 08/10/2024 EXAM: CT HEAD WITHOUT CONTRAST 08/10/2024 12:09:47 AM TECHNIQUE: CT of the head was performed without the administration of intravenous contrast. Automated exposure control, iterative reconstruction, and/or weight based adjustment of the mA/kV was utilized to reduce the radiation dose to as low as reasonably achievable. COMPARISON: 08/08/2024 CLINICAL HISTORY: fall fall fall fall fall FINDINGS: BRAIN AND VENTRICLES: No acute hemorrhage. No evidence of acute infarct. No hydrocephalus. No extra-axial collection. No mass effect or midline shift. ORBITS: No acute abnormality. SINUSES: No acute abnormality. SOFT TISSUES AND SKULL: No acute soft tissue abnormality. No skull fracture. IMPRESSION: 1. No acute intracranial abnormality. Electronically signed by: Morgane Naveau MD 08/10/2024 12:15 AM EST RP Workstation: [id number]   CT CERVICAL SPINE WO CONTRAST Result Date: 08/10/2024 EXAM: CT CERVICAL SPINE WITHOUT CONTRAST 08/10/2024 12:09:47 AM TECHNIQUE: CT of the cervical spine was performed  without the administration of intravenous contrast. Multiplanar reformatted images are provided for review. Automated exposure control, iterative reconstruction, and/or weight based adjustment of the mA/kV was utilized to reduce the radiation dose to as low as reasonably achievable. COMPARISON: None available. CLINICAL HISTORY: Fall Fall FINDINGS: BONES AND ALIGNMENT: No acute fracture or traumatic malalignment. DEGENERATIVE CHANGES: No significant degenerative changes. SOFT TISSUES: No prevertebral soft tissue swelling. IMPRESSION: 1. No significant abnormality Electronically signed by: Morgane Naveau MD 08/10/2024 12:14 AM EST RP Workstation: [id number]   Rapid EEG Result Date: 08/09/2024 Shelton Arlin KIDD, MD     08/14/2024  9:05 AM Patient Name: DAHMIR EPPERLY MRN: [id number] Epilepsy Attending:  Arlin MALVA Krebs Referring Physician/Provider: Lawence Madison LABOR, MD Date: 08/09/2024 Duration: 1 hour Patient history: 23yo M with seizure like episode. Rapid eeg to evaluate for seizure Level of alertness: Asleep/lethargic AEDs during EEG study: VPA, LEV Technical aspects: This EEG was obtained using a 10 lead EEG system positioned circumferentially without any parasagittal coverage (rapid EEG). Computer selected EEG is reviewed as  well as background features and all clinically significant events. Description: EEG showed 15 to 18 Hz, 2-3 uV beta activity  distributed symmetrically and diffusely.. Hyperventilation and photic stimulation were not performed.   IMPRESSION: This study is within normal limits. No seizures or epileptiform discharges were seen throughout the recording. If suspicion for interictal activity remains a concern, a conventional eeg with full montage and video can be considered. Priyanka MALVA Krebs   CT HEAD WO CONTRAST ( ) Result Date: 08/08/2024 CLINICAL DATA:  Shell evaluation for seizure disorder, clinical change. EXAM: CT HEAD WITHOUT CONTRAST TECHNIQUE: Contiguous axial images were  obtained from the base of the skull through the vertex without intravenous contrast. RADIATION DOSE REDUCTION: This exam was performed according to the departmental dose-optimization program which includes automated exposure control, adjustment of the mA and/or kV according to patient size and/or use of iterative reconstruction technique. COMPARISON:  CT from 08/06/2024. FINDINGS: Brain: Cerebral volume within normal limits. No acute intracranial hemorrhage. No acute large vessel territory infarct. No mass lesion or midline shift. No hydrocephalus or extra-axial fluid collection. Vascular: No abnormal hyperdense vessel. Skull: Scalp soft tissues demonstrate no acute finding. Calvarium intact. Sinuses/Orbits: Globes orbital soft tissues within normal limits. Paranasal sinuses and mastoid air cells are clear. Other: None. IMPRESSION: Stable head CT.  No acute intracranial abnormality. Electronically Signed   By: Morene Hoard M.D.   On: 08/08/2024 05:46   CT HEAD WO CONTRAST ( ) Result Date: 08/06/2024 EXAM: CT HEAD WITHOUT CONTRAST 08/06/2024 09:36:35 PM TECHNIQUE: CT of the head was performed without the administration of intravenous contrast. Automated exposure control, iterative reconstruction, and/or weight based adjustment of the mA/kV was utilized to reduce the radiation dose to as low as reasonably achievable. COMPARISON: 05/31/2024 CLINICAL HISTORY: Mental status change, unknown cause. FINDINGS: BRAIN AND VENTRICLES: No acute hemorrhage. No evidence of acute infarct. No hydrocephalus. No extra-axial collection. No mass effect or midline shift. ORBITS: No acute abnormality. SINUSES: No acute abnormality. SOFT TISSUES AND SKULL: No acute soft tissue abnormality. No skull fracture. IMPRESSION: 1. No acute intracranial abnormality. Electronically signed by: [name] MD 08/06/2024 09:53 PM EST RP Workstation: [id number]   Micro Results  Recent Results (from the past 240 hours)  MRSA Next Gen  by PCR, Nasal     Status: None   Collection Time: 08/18/24 12:52 PM   Specimen: Nasal Swab  Result Value Ref Range Status   MRSA by PCR Next Gen NOT DETECTED NOT DETECTED Final    Comment: (NOTE) The GeneXpert MRSA Assay (FDA approved for NASAL specimens only), is one component of a comprehensive MRSA colonization surveillance program. It is not intended to diagnose MRSA infection nor to guide or monitor treatment for MRSA infections. Test performance is not FDA approved in patients less than 74 years old. Performed at [facility], [address] [place], [place] [address]   Urine Culture (for pregnant, neutropenic or urologic patients or patients with an indwelling urinary catheter)     Status: Abnormal   Collection Time: 08/19/24  9:08 PM   Specimen: Urine, Clean Catch  Result Value Ref Range Status  Specimen Description   Final    URINE, CLEAN CATCH Performed at [facility], [address], [place], [place] [address]    Special Requests   Final    NONE Performed at [facility], [address] [place], [place] [address]    Culture >=100,000 COLONIES/mL ENTEROCOCCUS FAECALIS (A)  Final   Report Status 08/21/2024 FINAL  Final   Organism ID, Bacteria ENTEROCOCCUS FAECALIS (A)  Final      Susceptibility   Enterococcus faecalis - MIC*    AMPICILLIN <=2 SENSITIVE Sensitive     NITROFURANTOIN <=16 SENSITIVE Sensitive     VANCOMYCIN 1 SENSITIVE Sensitive     * >=100,000 COLONIES/mL ENTEROCOCCUS FAECALIS    Today   Subjective    Crystal Courtney today has no new complaints - -- Overnight patient was observed with rhythmic motion of hitting back of his head against the bed/rocking, and rhythmic fidgeting of hands and repeating pattern----patient apparently was anxious and received lorazepam  and morphine  - Workup including EKG and troponin, BMP, serum magnesium , and CBC unremarkable - -Patient is now resting/sleeping this a.m.   Patient has been seen and examined prior to  discharge   Objective   Blood pressure 114/61, pulse (!) 59, temperature 98.3 F (36.8 C), resp. rate 13, height 6' 3 (1.905 m), weight 113.9 kg, SpO2 98%.   Intake/Output Summary (Last 24 hours) at 08/22/2024 0939 Last data filed at 08/22/2024 0533 Gross per 24 hour  Intake 1475.62 ml  Output --  Net 1475.62 ml    Exam Gen:-Sleepy, arousable, cooperative, no acute distress  HEENT:- [place].AT, No sclera icterus Neck-Supple Neck,No JVD,.  Lungs-  CTAB , good air movement bilaterally CV- S1, S2 normal, regular Abd-  +ve B.Sounds, Abd Soft, No tenderness,    Extremity/Skin:- No  edema,   good pulses Psych-affect is flat oriented x3 Neuro-no new focal deficits, no tremors    Data Review   CBC w Diff:  Lab Results  Component Value Date   WBC 8.7 08/22/2024   HGB 13.2 08/22/2024   HCT 40.6 08/22/2024   PLT 230 08/22/2024   LYMPHOPCT 34 08/08/2024   MONOPCT 10 08/08/2024   EOSPCT 4 08/08/2024   BASOPCT 1 08/08/2024   CMP:  Lab Results  Component Value Date   NA 141 08/22/2024   K 3.9 08/22/2024   CL 101 08/22/2024   CO2 24 08/22/2024   BUN 12 08/22/2024   CREATININE 1.06 08/22/2024   PROT 7.5 08/21/2024   ALBUMIN 4.5 08/21/2024   BILITOT 0.3 08/21/2024   ALKPHOS 79 08/21/2024   AST 130 (H) 08/21/2024   ALT 53 (H) 08/21/2024   Total Discharge time is about 33 minutes  Rendall Carwin M.D on 08/22/2024 at 9:39 AM  Go to [email or web address] -  for contact info  [facility] - Office  [phone]   "

## 2024-08-22 NOTE — Progress Notes (Signed)
 Devere, RN called and given report at [facility]. D/C pending completion of fluids and transport.

## 2024-08-22 NOTE — Consult Note (Signed)
 [facility] Psychiatric Consult Initial  Patient Name: .Vincent Frank  MRN: [id number]  DOB: 03/17/2001  Consult Order details:  Orders (From admission, onward)     Start     Ordered   08/18/24 1423  CONSULT TO CALL ACT TEAM       Ordering Provider: Pearlean Manus, MD  Provider:  (Not yet assigned)  Question:  Reason for Consult?  Answer:  drug overdose   08/18/24 1422             Mode of Visit: Tele-visit Virtual Statement:TELE PSYCHIATRY ATTESTATION & CONSENT As the provider for this telehealth consult, I attest that I verified the patient's identity using two separate identifiers, introduced myself to the patient, provided my credentials, disclosed my location, and performed this encounter via a HIPAA-compliant, real-time, face-to-face, two-way, interactive audio and video platform and with the full consent and agreement of the patient (or guardian as applicable.) Patient physical location: [place]. Telehealth provider physical location: home office in state of [place].   Video start time: 1300 Video end time: 1330    Psychiatry Consult Evaluation  Service Date: August 22, 2024 LOS:  LOS: 4 days  Chief Complaint Intentional overdose   Primary Psychiatric Diagnoses  Suicidal overdose  Major depressive disorder without psychotic features  Assessment  Patient is recommended for inpatient psychiatric admission following medical clearance based on significant clinical concerns. Patient presents with active and repeated suicide attempts, including the current presentation for intentional overdose.  24 year old male with severe, recurrent depressive symptoms, history of multiple suicide attempts, recent intentional overdose, ongoing suicidal ideation, treatment resistance, minimal outpatient support, and significant psychosocial stressors. Patient remains high risk for self-harm and unable to safely engage in outpatient level of care. Please see plan below for detailed  recommendations.   Diagnoses:  Active Hospital problems: Principal Problem:   MDD (major depressive disorder), recurrent, severe, with psychosis (HCC) Active Problems:   Suicidal ideation   Iron deficiency anemia   Seizure (HCC)   MDD (major depressive disorder), recurrent severe, without psychosis (HCC)   Suicidal overdose, initial encounter (HCC)   SSRI overdose, intentional self-harm, initial encounter (HCC)   Intentional SSRI (selective serotonin reuptake inhibitor) overdose (HCC)   Ingestion of unknown medication   Acute metabolic encephalopathy    Plan   ## Psychiatric Medication Recommendations:  Will defer further management to inpatient psychiatric unit   ## Medical Decision Making Capacity: Not specifically addressed in this encounter   ## Further Work-up:    -- most recent EKG on 08/03/2024 had QtC of 336 -- Pertinent labwork reviewed earlier this admission includes: BMP, CBC, TSH, CMP, acetaminophen  level, ethanol, salicylate level, CBC     ## Disposition:-- Patient is under IVC and recommended for inpatient psychiatric admission after medical stabilization   ## Behavioral / Environmental: - No specific recommendations at this time.                 ## Safety and Observation Level:  - Based on my clinical evaluation, I estimate the patient to be at moderate risk of self harm in the current setting. - At this time, we recommend  1:1 Observation. This decision is based on my review of the chart including patient's history and current presentation, interview of the patient, mental status examination, and consideration of suicide risk including evaluating suicidal ideation, plan, intent, suicidal or self-harm behaviors, risk factors, and protective factors. This judgment is based on our ability to directly address suicide risk, implement suicide prevention  strategies, and develop a safety plan while the patient is in the clinical setting. Please contact our team if there is  a concern that risk level has changed.   CSSR Risk Category:C-SSRS RISK CATEGORY: High Risk   Suicide Risk Assessment: Patient has following modifiable risk factors for suicide: lack of access to outpatient mental health resources, current symptoms: anxiety/panic, insomnia, impulsivity, anhedonia, hopelessness, triggering events, and recent psychiatric hospitalization, which we are addressing by recommending inpatient psychiatric admission for further monitoring and stabilization. Patient has following non-modifiable or demographic risk factors for suicide: male gender, history of suicide attempt, and psychiatric hospitalization Patient has the following protective factors against suicide: Access to outpatient mental health care   Thank you for this consult request. Recommendations have been communicated to the primary team.  We will continue to follow this patient.   Vincent Frank, PMHNP       History of Present Illness  Relevant Aspects of Hospital ED Course:  Admitted on 08/17/2024 for Suicidal ideation and intentional medication overdose in the context of severe depression and family conflict.  Patient Report:  ED PMHNP evaluated 24 year old male brought to the emergency department by [facility] after reportedly going missing overnight. Parents, who reside in [place], utilized phone location services and reported patients location as a field in [place]. Law enforcement located patient confused in the field. Patient is unclear how he arrived there. Patient has remained in the emergency department since 01/23.  During assessment today, patient reports that his primary stressors and triggers are his parents and ongoing family conflict. He describes living in a chronically stressful home environment with mother, father, and sister, reporting strained relationships and feeling less valued than his sister. Patient identifies his living situation as a significant contributor to his  suicidal ideation and behavior. Patient explicitly states he does not want family involved in his care at this time.  Patient reports parents frequently nag him about aspects of his life that he does not wish to discuss and states they do not give him space. He reports having siblings but limited emotional closeness. Patient endorses intentionally overdosing on medications in an attempt to harm himself following family conflict and states he was so out of it that he does not remember what he took. Chart review reveals a recent intentional acetaminophen  overdose around 07/27/24. Patient endorses multiple suicide attempts in the past.  Patient has limited treatment response history, including trials of fluoxetine  and aripiprazole  (reports aripiprazole  worsened suicidal thoughts) and electroconvulsive therapy approximately two years ago, which provided only temporary improvement before depressive symptoms returned. 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  Patient is currently unemployed due to mental health symptoms, contributing to feelings of worthlessness, hopelessness, and lack of purpose. Patient has minimal outpatient support and is not established with a psychiatrist or medication management provider. He reports attending only one therapy session with [employer]. Patient continues to appear dysphoric, endorses ongoing suicidal ideation, and denies homicidal ideation or auditory/visual hallucinations. Patient expresses limited future orientation, stating he has no energy for relationships, friendships, or hobbies, and lacks insight into potential alternative living arrangements or independent functioning.  Psych ROS:  Depression: Endorsed Anxiety: Endorsed Mania (lifetime and current): Denied Psychosis: (lifetime and current): Denied   Collateral information:  Patient declined to  provide contact for collateral information at this time    Review of Systems  Psychiatric/Behavioral:  Positive for depression and suicidal ideas.      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: Denied Living Situation: With mother, father and sister  Access to weapons/lethal means: Denied access to firearms   Substance History Alcohol: Endorsed occasional use History of alcohol withdrawal seizures denied History of DT's denied Tobacco: Denied Illicit drugs: Denied Prescription drug abuse: Denied Rehab hx: Denied  Exam Findings  Physical Exam:  Vital Signs:  Temp:  [97.4 F (36.3 C)-98.3 F (36.8 C)] 98.2 F (36.8 C) (01/27 1337) Pulse Rate:  [59-89] 79 (01/27 1337) Resp:  [13-20] 16 (01/27 1337) BP: (114-149)/(61-122) 122/65 (01/27 1337) SpO2:  [91 %-99 %] 97 % (01/27 1337) Blood pressure 122/65, pulse 79, temperature 98.2 F (36.8 C), resp. rate 16, height 6' 3 (1.905 m), weight 113.9 kg, SpO2 97%. Body mass index is 31.37 kg/m.  Physical Exam Psychiatric:        Mood and Affect: Mood is depressed. Affect is flat.        Behavior: Behavior is withdrawn.        Judgment: Judgment is impulsive.     Mental Status Exam: General Appearance: Fairly Groomed  Orientation:  Full (Time, Place, and Person)  Memory:  Fair  Concentration:  Concentration: Fair  Recall:  Fair  Attention  Fair  Eye Contact:  Minimal  Speech:   Clear and Coherent  Language:  Fair  Volume:  Normal  Mood: Depressed  Affect:  Depressed  Thought Process:  Coherent and Linear  Thought Content:  Logical  Suicidal Thoughts:  Yes.  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  Family History:  The patient's family history includes Healthy in his mother.  Medical History: Past Medical History:  Diagnosis Date   Anxiety    Depression    Major depressive disorder    Seizures (HCC)     Surgical History: Past Surgical History:  Procedure Laterality Date   NO PAST SURGERIES       Medications:  Current Medications[1]  Allergies: Allergies[2]  Jennye Runquist Frank, PMHNP     [1]  Current Facility-Administered Medications:    acetaminophen  (TYLENOL ) tablet 650 mg, 650 mg, Oral, Q6H PRN, 650 mg at 08/21/24 1836 **OR** acetaminophen  (TYLENOL ) suppository 650 mg, 650 mg, Rectal, Q6H PRN, Adefeso, Oladapo, DO   Chlorhexidine  Gluconate Cloth 2 % PADS 6 each, 6 each, Topical, Q0600, Emokpae, Courage, MD, 6 each at 08/19/24 1216   enoxaparin  (LOVENOX ) injection 40 mg, 40 mg, Subcutaneous, Q24H, Emokpae, Courage, MD   ferrous sulfate  tablet 325 mg, 325 mg, Oral, Q breakfast, Emokpae, Courage, MD, 325 mg at 08/22/24 [phone]  LORazepam  (ATIVAN ) injection 1 mg, 1 mg, Intravenous, Q4H PRN, Daniels, James K, NP   ondansetron  (ZOFRAN ) tablet 4 mg, 4 mg, Oral, Q6H PRN **OR** ondansetron  (ZOFRAN ) injection 4 mg, 4 mg, Intravenous, Q6H PRN, Adefeso, Oladapo, DO, 4 mg at 08/17/24 2306 [2]  Allergies Allergen  Reactions   Bee Venom Anaphylaxis   Gabapentin  Other (See Comments) and Anaphylaxis    Muscle twitching   Haldol [Haloperidol] Anaphylaxis and Other (See Comments)    Per father, dystonic reaction with  muscle contractures- also   Hornet Venom Anaphylaxis and Itching   Peanut-Containing Drug Products Anaphylaxis   Wasp Venom Anaphylaxis   Beeswax Swelling

## 2024-08-22 NOTE — Plan of Care (Signed)
" °  Problem: Education: Goal: Knowledge of General Education information will improve Description: Including pain rating scale, medication(s)/side effects and non-pharmacologic comfort measures 08/22/2024 1119 by Mathews Norleen POUR, RN Outcome: Adequate for Discharge 08/22/2024 0836 by Mathews Norleen POUR, RN Outcome: Progressing   Problem: Health Behavior/Discharge Planning: Goal: Ability to manage health-related needs will improve 08/22/2024 1119 by Mathews Norleen POUR, RN Outcome: Adequate for Discharge 08/22/2024 0836 by Mathews Norleen POUR, RN Outcome: Progressing   Problem: Clinical Measurements: Goal: Ability to maintain clinical measurements within normal limits will improve 08/22/2024 1119 by Mathews Norleen POUR, RN Outcome: Adequate for Discharge 08/22/2024 0836 by Mathews Norleen POUR, RN Outcome: Progressing Goal: Will remain free from infection 08/22/2024 1119 by Mathews Norleen POUR, RN Outcome: Adequate for Discharge 08/22/2024 [phone] by Mathews Norleen POUR, RN Outcome: Progressing Goal: Diagnostic test results will improve 08/22/2024 1119 by Mathews Norleen POUR, RN Outcome: Adequate for Discharge 08/22/2024 0836 by Mathews Norleen POUR, RN Outcome: Progressing Goal: Respiratory complications will improve 08/22/2024 1119 by Mathews Norleen POUR, RN Outcome: Adequate for Discharge 08/22/2024 0836 by Mathews Norleen POUR, RN Outcome: Progressing Goal: Cardiovascular complication will be avoided 08/22/2024 1119 by Mathews Norleen POUR, RN Outcome: Adequate for Discharge 08/22/2024 0836 by Mathews Norleen POUR, RN Outcome: Progressing   Problem: Activity: Goal: Risk for activity intolerance will decrease 08/22/2024 1119 by Mathews Norleen POUR, RN Outcome: Adequate for Discharge 08/22/2024 [phone] by Mathews Norleen POUR, RN Outcome: Progressing   Problem: Nutrition: Goal: Adequate nutrition will be maintained 08/22/2024 1119 by Mathews Norleen POUR, RN Outcome: Adequate for Discharge 08/22/2024 0836 by Mathews Norleen POUR, RN Outcome: Progressing   Problem: Coping: Goal: Level of anxiety  will decrease 08/22/2024 1119 by Mathews Norleen POUR, RN Outcome: Adequate for Discharge 08/22/2024 0836 by Mathews Norleen POUR, RN Outcome: Progressing   Problem: Elimination: Goal: Will not experience complications related to bowel motility 08/22/2024 1119 by Mathews Norleen POUR, RN Outcome: Adequate for Discharge 08/22/2024 0836 by Mathews Norleen POUR, RN Outcome: Progressing Goal: Will not experience complications related to urinary retention 08/22/2024 1119 by Mathews Norleen POUR, RN Outcome: Adequate for Discharge 08/22/2024 0836 by Mathews Norleen POUR, RN Outcome: Progressing   Problem: Pain Managment: Goal: General experience of comfort will improve and/or be controlled 08/22/2024 1119 by Mathews Norleen POUR, RN Outcome: Adequate for Discharge 08/22/2024 0836 by Mathews Norleen POUR, RN Outcome: Progressing   Problem: Safety: Goal: Ability to remain free from injury will improve 08/22/2024 1119 by Mathews Norleen POUR, RN Outcome: Adequate for Discharge 08/22/2024 0836 by Mathews Norleen POUR, RN Outcome: Progressing   Problem: Skin Integrity: Goal: Risk for impaired skin integrity will decrease 08/22/2024 1119 by Mathews Norleen POUR, RN Outcome: Adequate for Discharge 08/22/2024 0836 by Mathews Norleen POUR, RN Outcome: Progressing   "

## 2024-08-22 NOTE — Plan of Care (Signed)

## 2024-08-22 NOTE — TOC Progression Note (Addendum)
 Transition of Care [facility]) - Progression Note    Patient Details  Name: Vincent Frank MRN: [id number] Date of Birth: 04/17/2001  Transition of [facility] CM/SW Contact  Mcarthur Saddie Kim, [place] Phone Number: 08/22/2024, 10:51 AM  Clinical Narrative:  Elenor with [facility] reports they have bed available today for pt at [facility] [address] [place], [place]. LCSW notified pt at bedside with RN and sitter present. Pt initially mumbled when LCSW introduced self, but when made aware of plan to go to [facility] pt did not respond. Floor is arranging transport with [facility] as pt is IVC. D/C summary faxed. RN given number to call report.        Barriers to Discharge: Barriers Resolved               Expected Discharge Plan and Services         Expected Discharge Date: 08/22/24                                     Social Drivers of Health (SDOH) Interventions SDOH Screenings   Food Insecurity: No Food Insecurity (08/18/2024)  Recent Concern: Food Insecurity - Food Insecurity Present (07/28/2024)   Received from [facility]  Housing: Low Risk (08/18/2024)  Recent Concern: Housing - High Risk (07/28/2024)   Received from [facility]  Transportation Needs: No Transportation Needs (08/18/2024)  Recent Concern: Transportation Needs - Unmet Transportation Needs (07/13/2024)   Received from [facility]  Utilities: Not At Risk (08/18/2024)  Recent Concern: Utilities - High Risk (07/13/2024)   Received from [facility]  Alcohol Screen: Low Risk (06/03/2023)  Financial Resource Strain: Medium Risk (07/13/2024)   Received from [facility]  Physical Activity: Sufficiently Active (07/13/2024)   Received from [facility]  Social Connections: Patient Declined (08/03/2024)  Recent Concern: Social Connections - Moderately Isolated (07/13/2024)   Received from [facility]  Stress: Stress Concern Present (07/28/2024)   Received from [facility]   Tobacco Use: Low Risk (08/22/2024)  Health Literacy: Low Risk (07/13/2024)   Received from [facility]    Readmission Risk Interventions     No data to display

## 2024-08-22 NOTE — Progress Notes (Signed)
"  ° °      Overnight   NAME: Vincent Frank MRN: [id number] DOB : Mar 08, 2001    Date of Service   08/22/2024   HPI/Events of Note    Notified by overhead notification for rapid response.  24 year old medical history of MDD, seizure disorder. Admitted for concern of drug overdose.  Patient has recently been admitted from 08/02/2024 to 08/11/2024 due to seizure-like activity, SSRI overdose, Benadryl  overdose, suicidal ideation, and attempts by overdosing on Wellbutrin and Benadryl .  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  Bedside visit Patient complained to RN of side/flank chest pain. Patient is observed with rhythmic motion of hitting back of his head against the bed/rocking, and rhythmic fidgeting of hands and repeating pattern.  Patient is given Ativan  prior to arrival, and morphine  with arrival due to expressing chest pain with RN. Vital signs are BP 136/76 Pulse rate 77 Respiratory rate 18 SpO2 99% on room air  Patient is obviously anxious and appears as earlier notes describing Psychogenic nonepileptic event.    Interventions/ Plan   CBC, BMP, lactic acid, troponin Ativan  as previously prescribed Morphine  for current explanation of chest pain/flank pain to RN          Lynwood Kipper BSN MSNA MSN ACNPC-AG Acute Care Nurse Practitioner [facility] [place]  "

## 2024-08-22 NOTE — Discharge Instructions (Signed)
 1)Discharged to inpatient psychiatric unit/[facility]
